# Patient Record
Sex: Male | Born: 1945 | Race: White | Hispanic: No | Marital: Married | Smoking: Former smoker
Health system: Southern US, Community
[De-identification: ages and names within clinical notes are randomized; demographics above are authoritative.]

## PROBLEM LIST (undated history)

## (undated) DIAGNOSIS — J449 Chronic obstructive pulmonary disease, unspecified: Secondary | ICD-10-CM

## (undated) DIAGNOSIS — I340 Nonrheumatic mitral (valve) insufficiency: Secondary | ICD-10-CM

## (undated) DIAGNOSIS — K2981 Duodenitis with bleeding: Principal | ICD-10-CM

## (undated) DIAGNOSIS — I350 Nonrheumatic aortic (valve) stenosis: Secondary | ICD-10-CM

## (undated) DIAGNOSIS — J45909 Unspecified asthma, uncomplicated: Secondary | ICD-10-CM

## (undated) DIAGNOSIS — M109 Gout, unspecified: Secondary | ICD-10-CM

## (undated) DIAGNOSIS — C61 Malignant neoplasm of prostate: Secondary | ICD-10-CM

## (undated) DIAGNOSIS — R911 Solitary pulmonary nodule: Secondary | ICD-10-CM

## (undated) DIAGNOSIS — E041 Nontoxic single thyroid nodule: Secondary | ICD-10-CM

## (undated) DIAGNOSIS — K2970 Gastritis, unspecified, without bleeding: Secondary | ICD-10-CM

## (undated) DIAGNOSIS — I4892 Unspecified atrial flutter: Secondary | ICD-10-CM

## (undated) DIAGNOSIS — D649 Anemia, unspecified: Secondary | ICD-10-CM

## (undated) DIAGNOSIS — B9681 Helicobacter pylori [H. pylori] as the cause of diseases classified elsewhere: Secondary | ICD-10-CM

## (undated) DIAGNOSIS — N183 Chronic kidney disease, stage 3 unspecified: Secondary | ICD-10-CM

## (undated) DIAGNOSIS — I10 Essential (primary) hypertension: Secondary | ICD-10-CM

## (undated) HISTORY — DX: Duodenitis with bleeding: K29.81

## (undated) HISTORY — DX: Helicobacter pylori (H. pylori) as the cause of diseases classified elsewhere: B96.81

## (undated) HISTORY — DX: Nonrheumatic aortic (valve) stenosis: I35.0

## (undated) HISTORY — DX: Chronic kidney disease, stage 3 unspecified: N18.30

## (undated) HISTORY — DX: Essential (primary) hypertension: I10

## (undated) HISTORY — DX: Anemia, unspecified: D64.9

## (undated) HISTORY — PX: COLONOSCOPY: SHX174

## (undated) HISTORY — PX: HERNIA REPAIR: SHX51

## (undated) HISTORY — DX: Malignant neoplasm of prostate: C61

## (undated) HISTORY — PX: PROSTATE SURGERY: SHX751

## (undated) HISTORY — DX: Gastritis, unspecified, without bleeding: K29.70

## (undated) HISTORY — DX: Chronic kidney disease, stage 3 (moderate): N18.3

---

## 2009-11-18 ENCOUNTER — Ambulatory Visit: Payer: Self-pay | Admitting: Surgery

## 2009-11-24 ENCOUNTER — Ambulatory Visit: Payer: Self-pay | Admitting: Surgery

## 2013-04-20 DIAGNOSIS — Z6841 Body Mass Index (BMI) 40.0 and over, adult: Secondary | ICD-10-CM | POA: Insufficient documentation

## 2013-04-20 DIAGNOSIS — I10 Essential (primary) hypertension: Secondary | ICD-10-CM

## 2013-04-20 DIAGNOSIS — R011 Cardiac murmur, unspecified: Secondary | ICD-10-CM

## 2013-04-20 DIAGNOSIS — C61 Malignant neoplasm of prostate: Secondary | ICD-10-CM

## 2013-04-20 DIAGNOSIS — E7800 Pure hypercholesterolemia, unspecified: Secondary | ICD-10-CM | POA: Insufficient documentation

## 2013-04-20 DIAGNOSIS — J449 Chronic obstructive pulmonary disease, unspecified: Secondary | ICD-10-CM | POA: Insufficient documentation

## 2013-04-20 DIAGNOSIS — M549 Dorsalgia, unspecified: Secondary | ICD-10-CM | POA: Insufficient documentation

## 2013-04-23 ENCOUNTER — Encounter: Payer: Self-pay | Admitting: Cardiovascular Disease

## 2013-04-23 ENCOUNTER — Ambulatory Visit (INDEPENDENT_AMBULATORY_CARE_PROVIDER_SITE_OTHER): Payer: 59 | Admitting: Cardiovascular Disease

## 2013-04-23 ENCOUNTER — Ambulatory Visit: Payer: Self-pay | Admitting: Cardiology

## 2013-04-23 ENCOUNTER — Ambulatory Visit (HOSPITAL_COMMUNITY)
Admission: RE | Admit: 2013-04-23 | Discharge: 2013-04-23 | Disposition: A | Discharge status: Home / Self Care | Payer: Medicare PPO | Source: Ambulatory Visit | Attending: Cardiology | Admitting: Cardiology

## 2013-04-23 VITALS — BP 116/65 | HR 100 | Ht 66.0 in | Wt 223.2 lb

## 2013-04-23 DIAGNOSIS — I420 Dilated cardiomyopathy: Secondary | ICD-10-CM

## 2013-04-23 DIAGNOSIS — I429 Cardiomyopathy, unspecified: Secondary | ICD-10-CM | POA: Insufficient documentation

## 2013-04-23 DIAGNOSIS — I359 Nonrheumatic aortic valve disorder, unspecified: Secondary | ICD-10-CM

## 2013-04-23 DIAGNOSIS — E785 Hyperlipidemia, unspecified: Secondary | ICD-10-CM

## 2013-04-23 DIAGNOSIS — Z5181 Encounter for therapeutic drug level monitoring: Secondary | ICD-10-CM

## 2013-04-23 DIAGNOSIS — I350 Nonrheumatic aortic (valve) stenosis: Secondary | ICD-10-CM

## 2013-04-23 DIAGNOSIS — I4892 Unspecified atrial flutter: Secondary | ICD-10-CM | POA: Insufficient documentation

## 2013-04-23 DIAGNOSIS — I10 Essential (primary) hypertension: Secondary | ICD-10-CM | POA: Insufficient documentation

## 2013-04-23 DIAGNOSIS — Z87891 Personal history of nicotine dependence: Secondary | ICD-10-CM | POA: Insufficient documentation

## 2013-04-23 DIAGNOSIS — I428 Other cardiomyopathies: Secondary | ICD-10-CM

## 2013-04-23 DIAGNOSIS — J4489 Other specified chronic obstructive pulmonary disease: Secondary | ICD-10-CM | POA: Insufficient documentation

## 2013-04-23 DIAGNOSIS — I340 Nonrheumatic mitral (valve) insufficiency: Secondary | ICD-10-CM

## 2013-04-23 DIAGNOSIS — I059 Rheumatic mitral valve disease, unspecified: Secondary | ICD-10-CM | POA: Insufficient documentation

## 2013-04-23 DIAGNOSIS — J449 Chronic obstructive pulmonary disease, unspecified: Secondary | ICD-10-CM | POA: Insufficient documentation

## 2013-04-23 DIAGNOSIS — Z7901 Long term (current) use of anticoagulants: Secondary | ICD-10-CM

## 2013-04-23 MED ORDER — CARVEDILOL 3.125 MG PO TABS: 3.1250 mg | ORAL_TABLET | Freq: Two times a day (BID) | ORAL | Status: DC

## 2013-04-23 MED ORDER — FLUTICASONE-SALMETEROL 250-50 MCG/DOSE IN AEPB: 1.0000 | INHALATION_SPRAY | RESPIRATORY_TRACT | Status: DC | PRN

## 2013-04-23 MED ORDER — WARFARIN SODIUM 5 MG PO TABS: 5.0000 mg | ORAL_TABLET | Freq: Every day | ORAL | Status: DC

## 2013-04-23 NOTE — Patient Instructions (Addendum)
Your physician recommends that you schedule a follow-up appointment in: 1 month   Your physician has requested that you have a lexiscan myoview. For further information please visit [email or web address]. Please follow instruction sheet, as given.   Your physician has recommended you make the following change in your medication:   Start Coreg 3.125 mg twice a day  Start Coumadin 5 mg at night--We will make appointment today for you to see Coumadin nurse Edrick Oh RN next week  I have put in a prescription for your Advair  Inhaler also to use as needed   Thanks for choosing [facility] !

## 2013-04-23 NOTE — Progress Notes (Signed)
*  PRELIMINARY RESULTS* Echocardiogram 2D Echocardiogram has been performed.  [place], [place] 04/23/2013, 3:52 PM

## 2013-04-23 NOTE — Progress Notes (Signed)
Patient ID: Ryan Shea, male   DOB: 10-27-1945, 68 y.o.   MRN: [id number]       CARDIOLOGY CONSULT NOTE  Patient ID: Ryan Shea MRN: [id number] DOB/AGE: 09/01/1945 68 y.o.  Admit date: (Not on file) Primary Physician No primary provider on file.  Reason for Consultation: cardiomyopathy, valvular disease, abnormal heart rhythm  HPI: The patient is a 68 yr old male who I am meeting for the first time. He has a history of a cardiomyopathy diagnosed in 08/2012 as well as HTN, COPD, and hyperlipidemia. He tells me he "used to be a heavy drinker" but quit altogether 5-6 years ago. He quit cigarette smoking 30 years ago. He takes Advair prn for shortness of breath. He denies a history of an MI. He denies symptoms of chest pain, dizziness, leg swelling, palpitations, orthopnea, paroxysmal nocturnal dyspnea, and syncope. He had been noncompliant with pravastatin until recently. He reportedly had lipids checked one week ago, but those results are unavailable to me.  He had an echocardiogram performed at [facility] on 08/31/2012 which reportedly showed an "ischemic appearing dilated cardiomyopathy" with wall motion abnormalities. It also showed "mild to moderate LVH, EF 76-19%, grade I diastolic dysfunction, moderate mitral regurgitation, mild aortic regurgitation, at least mild aortic stenosis" but a greater degree was suspected of aortic stenosis due to incorrect measurements of the LVOT. There was also a possible pericardial effusion, which was not deemed hemodynamically significant.  An ECG today shows atrial flutter, 100 bpm, with a nonspecific ST abnormality and one PVC.    No Known Allergies  Current Outpatient Prescriptions  Medication Sig Dispense Refill  . furosemide (LASIX) 40 MG tablet Take 40 mg by mouth daily.      Marland Kitchen lisinopril (PRINIVIL,ZESTRIL) 20 MG tablet Take 20 mg by mouth daily.      . pravastatin (PRAVACHOL) 10 MG tablet Take 10 mg by mouth daily.        . carvedilol (COREG) 3.125 MG tablet Take 1 tablet (3.125 mg total) by mouth 2 (two) times daily with a meal.  60 tablet  6  . warfarin (COUMADIN) 5 MG tablet Take 1 tablet (5 mg total) by mouth daily.  90 tablet  3   No current facility-administered medications for this visit.    No past medical history on file.  No past surgical history on file.  History   Social History  . Marital Status: Married    Spouse Name: N/A    Number of Children: N/A  . Years of Education: N/A   Occupational History  . Not on file.   Social History Main Topics  . Smoking status: Former Research scientist (life sciences)  . Smokeless tobacco: Not on file     Comment: quit 30 years ago  . Alcohol Use: No  . Drug Use: No  . Sexual Activity: Not on file   Other Topics Concern  . Not on file   Social History Narrative  . No narrative on file     No family history on file.   Prior to Admission medications   Medication Sig Start Date End Date Taking? Authorizing Provider  furosemide (LASIX) 40 MG tablet Take 40 mg by mouth daily.   Yes Historical Provider, MD  lisinopril (PRINIVIL,ZESTRIL) 20 MG tablet Take 20 mg by mouth daily.   Yes Historical Provider, MD  pravastatin (PRAVACHOL) 10 MG tablet Take 10 mg by mouth daily.   Yes Historical Provider, MD  carvedilol (COREG) 3.125 MG tablet Take 1 tablet (  3.125 mg total) by mouth 2 (two) times daily with a meal. 04/23/13   Arnoldo Lenis, MD  warfarin (COUMADIN) 5 MG tablet Take 1 tablet (5 mg total) by mouth daily. 04/23/13   Arnoldo Lenis, MD     Review of systems complete and found to be negative unless listed above in HPI     Physical exam Blood pressure 116/65, pulse 100, height 5\' 6"  (1.676 m), weight 223 lb 4 oz (101.266 kg). General: NAD Neck: No JVD, no thyromegaly or thyroid nodule.  Lungs: Clear to auscultation bilaterally with normal respiratory effort. CV: Nondisplaced PMI.  Heart regular rhythm, normal S1, diminished S2, no S3, III/VI harsh  late-peaking systolic murmur at RUSB and III/VI apical holosystolic murmur.  No peripheral edema.  No carotid bruit with transmission of aortic valve murmur.  Normal pedal pulses.  Abdomen: Soft, nontender, no hepatosplenomegaly, no distention.  Skin: Intact without lesions or rashes.  Neurologic: Alert and oriented x 3.  Psych: Normal affect. Extremities: No clubbing or cyanosis.  HEENT: Normal.   Labs:   No results found for this basename: WBC, HGB, HCT, MCV, PLT   No results found for this basename: NA, K, CL, CO2, BUN, CREATININE, CALCIUM, LABALBU, PROT, BILITOT, ALKPHOS, ALT, AST, GLUCOSE,  in the last 168 hours No results found for this basename: CKTOTAL, CKMB, CKMBINDEX, TROPONINI    No results found for this basename: CHOL   No results found for this basename: HDL   No results found for this basename: LDLCALC   No results found for this basename: TRIG   No results found for this basename: CHOLHDL   No results found for this basename: LDLDIRECT         Studies: See above.  ASSESSMENT AND PLAN:  1. Cardiomyopathy: given the prior echo results with mention of wall motion abnormalities, this may be ischemic in etiology, albeit the patient denies a history of MI. Given his current atrial flutter, there is also the possibility that this is tachycardia-mediated. He is already on an ACEI and Lasix. I will start carvedilol 3.125 mg bid with the hopes of improving LV systolic function, as well as to control heart rate. I will obtain a Lexiscan Cardiolite to evaluate for an ischemic etiology. 2. Chronic systolic heart failure: appears compensated at present with no evidence of acute heart failure. Continue Lasix and ACEI. Will add Coreg and likely reassess LV systolic function after 3 months. 3. Atrial flutter: his CHADS-Vasc score is at least 3 (CHF, HTN, age), thus anticoagulation for stroke prevention is indicated. Given his valvular heart disease (moderate MR), warfarin is the  most suitable option. I will initiate this today and enroll him in the anticoagulation clinic for INR monitoring. 4. Aortic stenosis: it was at least mild by his last echo but by auscultation, it sounds more significant. I will obtain an echocardiogram today for a more accurate assessment. No evidence of heart failure at present, and he appears asymptomatic. 5. Mitral regurgitation: last assessed as moderate. This is likely driving his atrial flutter. Will repeat echo. No evidence of heart failure. 6. HTN: presently well controlled on lisinopril 20 mg daily. 7. Hyperlipidemia: reportedly had lipids checked one week ago. Will try and obtain these results.  Dispo: f/u 1 month.  Signed: Kate Sable, M.D., F.A.C.C.  04/23/2013, 1:57 PM

## 2013-04-25 ENCOUNTER — Telehealth: Payer: Self-pay

## 2013-04-25 NOTE — Telephone Encounter (Signed)
Directions for use were provide when rx written but will refax to pharmacy,use 1 inhalation twice a day prn

## 2013-04-25 NOTE — Telephone Encounter (Signed)
Received fax refill for Advair 250/50 mcg diskus and frequency is missing, please clarify. In refill basket.

## 2013-04-25 NOTE — Telephone Encounter (Signed)
Message copied by Bernita Raisin on Wed Apr 25, 2013  5:03 PM ------      Message from: Kate Sable A      Created: Wed Apr 25, 2013  4:13 PM       I will discuss at [facility]. ------

## 2013-04-25 NOTE — Telephone Encounter (Signed)
LM advising pt of MD message,see note below

## 2013-04-27 ENCOUNTER — Telehealth: Payer: Self-pay | Admitting: *Deleted

## 2013-04-27 ENCOUNTER — Telehealth: Payer: Self-pay

## 2013-04-27 DIAGNOSIS — E7800 Pure hypercholesterolemia, unspecified: Secondary | ICD-10-CM

## 2013-04-27 MED ORDER — PRAVASTATIN SODIUM 40 MG PO TABS
40.0000 mg | ORAL_TABLET | Freq: Every evening | ORAL | Status: DC
Start: 2013-04-27 — End: 2013-05-21

## 2013-04-27 NOTE — Telephone Encounter (Signed)
             Ryan Shea (05-25-2045) Call with results, Call patient Received: Today     [name], MD Bernita Raisin, RN            LDL 124. Please have him increase pravastatin to 40 mg daily and repeat lipids in 3 months.                     I spoke with patient,he understands change in medications,will call in new rx and mail lab slip to pt

## 2013-04-27 NOTE — Telephone Encounter (Signed)
Per Dr.Koneswaran, pt needs to use Advair Diskus BID not PRN Faxed to pharmacy

## 2013-04-27 NOTE — Telephone Encounter (Signed)
Fax from [facility] f [phone] advair 250/16mcg  Direction are unclear Form in box

## 2013-04-30 ENCOUNTER — Encounter: Payer: Self-pay | Admitting: Cardiovascular Disease

## 2013-05-01 ENCOUNTER — Telehealth: Payer: Self-pay

## 2013-05-01 NOTE — Telephone Encounter (Signed)
Dose increase confirmed with pharmacy --- spoke with Vaughan Basta, pharmacist with [facility].

## 2013-05-01 NOTE — Telephone Encounter (Signed)
[facility] needs clarification of medication pravastatin (PRAVACHOL) 40 MG tablet  Patient was taking 20mg  from Dr. Karna Dupes.  Need to clarify medication was increased.  Reference # [id number]  Call back [phone]

## 2013-05-04 ENCOUNTER — Encounter (HOSPITAL_COMMUNITY)
Admission: RE | Admit: 2013-05-04 | Discharge: 2013-05-04 | Disposition: A | Discharge status: Home / Self Care | Payer: Medicare PPO | Source: Ambulatory Visit | Attending: Cardiology | Admitting: Cardiology

## 2013-05-04 ENCOUNTER — Encounter (HOSPITAL_COMMUNITY): Payer: Self-pay

## 2013-05-04 DIAGNOSIS — I428 Other cardiomyopathies: Secondary | ICD-10-CM

## 2013-05-04 DIAGNOSIS — I4892 Unspecified atrial flutter: Secondary | ICD-10-CM

## 2013-05-04 DIAGNOSIS — I420 Dilated cardiomyopathy: Secondary | ICD-10-CM

## 2013-05-04 DIAGNOSIS — I429 Cardiomyopathy, unspecified: Secondary | ICD-10-CM

## 2013-05-04 HISTORY — DX: Unspecified asthma, uncomplicated: J45.909

## 2013-05-04 MED ORDER — SODIUM CHLORIDE 0.9 % IJ SOLN
INTRAMUSCULAR | Status: AC
  Administered 2013-05-04: 10 mL via INTRAVENOUS
  Filled 2013-05-04: qty 10

## 2013-05-04 MED ORDER — REGADENOSON 0.4 MG/5ML IV SOLN
INTRAVENOUS | Status: AC
  Administered 2013-05-04: 0.4 mg via INTRAVENOUS
  Filled 2013-05-04: qty 5

## 2013-05-04 MED ORDER — TECHNETIUM TC 99M SESTAMIBI GENERIC - CARDIOLITE
10.0000 | Freq: Once | INTRAVENOUS | Status: AC | PRN
  Administered 2013-05-04: 10 via INTRAVENOUS

## 2013-05-04 MED ORDER — TECHNETIUM TC 99M SESTAMIBI - CARDIOLITE
30.0000 | Freq: Once | INTRAVENOUS | Status: AC | PRN
  Administered 2013-05-04: 30 via INTRAVENOUS

## 2013-05-04 NOTE — Progress Notes (Signed)
Stress Lab Nurses Notes - Ryan Na  [name] 05/04/2013 Reason for doing test: Cardiomyopathy, abnormal heart rhythm Type of test: Wille Glaser Nurse performing test: Gerrit Halls, RN Nuclear Medicine Tech: Redmond Baseman Echo Tech: Not Applicable MD performing test: Koneswaran/K.Lawrence NP Family MD: Alford Highland Test explained and consent signed: yes IV started: 22g jelco, Saline lock flushed, No redness or edema and Saline lock started in radiology Symptoms: SOB Treatment/Intervention: None Reason test stopped: protocol completed After recovery IV was: Discontinued via X-ray tech and No redness or edema Patient to return to [place]. Med at : 11:30 Patient discharged: Home Patient's Condition upon discharge was: stable Comments: During test BP 136/58 & HR 107.  Recovery BP  156/75 & HR 95.  Symptoms resolved in recovery. [name] T

## 2013-05-21 ENCOUNTER — Encounter: Payer: Self-pay | Admitting: Cardiovascular Disease

## 2013-05-21 ENCOUNTER — Ambulatory Visit (INDEPENDENT_AMBULATORY_CARE_PROVIDER_SITE_OTHER): Payer: 59 | Admitting: Cardiovascular Disease

## 2013-05-21 VITALS — BP 147/65 | HR 94 | Ht 66.0 in | Wt 226.0 lb

## 2013-05-21 DIAGNOSIS — I059 Rheumatic mitral valve disease, unspecified: Secondary | ICD-10-CM

## 2013-05-21 DIAGNOSIS — I350 Nonrheumatic aortic (valve) stenosis: Secondary | ICD-10-CM

## 2013-05-21 DIAGNOSIS — I10 Essential (primary) hypertension: Secondary | ICD-10-CM

## 2013-05-21 DIAGNOSIS — G479 Sleep disorder, unspecified: Secondary | ICD-10-CM

## 2013-05-21 DIAGNOSIS — I359 Nonrheumatic aortic valve disorder, unspecified: Secondary | ICD-10-CM

## 2013-05-21 DIAGNOSIS — Z7901 Long term (current) use of anticoagulants: Secondary | ICD-10-CM

## 2013-05-21 DIAGNOSIS — I340 Nonrheumatic mitral (valve) insufficiency: Secondary | ICD-10-CM

## 2013-05-21 DIAGNOSIS — Z5181 Encounter for therapeutic drug level monitoring: Secondary | ICD-10-CM

## 2013-05-21 DIAGNOSIS — I428 Other cardiomyopathies: Secondary | ICD-10-CM

## 2013-05-21 DIAGNOSIS — I4892 Unspecified atrial flutter: Secondary | ICD-10-CM

## 2013-05-21 DIAGNOSIS — I429 Cardiomyopathy, unspecified: Secondary | ICD-10-CM

## 2013-05-21 DIAGNOSIS — E7800 Pure hypercholesterolemia, unspecified: Secondary | ICD-10-CM

## 2013-05-21 MED ORDER — CARVEDILOL 6.25 MG PO TABS: 6.2500 mg | ORAL_TABLET | Freq: Two times a day (BID) | ORAL | Status: DC

## 2013-05-21 MED ORDER — ZOLPIDEM TARTRATE 5 MG PO TABS: 5.0000 mg | ORAL_TABLET | Freq: Every evening | ORAL | Status: DC | PRN

## 2013-05-21 NOTE — Progress Notes (Signed)
Patient ID: Ryan Shea, male   DOB: 26-Mar-1945, 68 y.o.   MRN: [id number]      SUBJECTIVE: The patient is here to followup on the results of cardiovascular testing performed for the evaluation of his cardiomyopathy and valvular heart disease. His Lexiscan Cardiolite was deemed to be probably normal with no definitive ischemic defects seen, and soft tissue attenuation artifact noted. His echocardiogram showed normal left ventricular systolic function, EF 46-27%, severe LVH, grade 1 diastolic dysfunction, high left ventricular filling pressures, mild aortic and mitral regurgitation, and moderate aortic stenosis with a mean gradient of 20 mm mercury. The left atrium was mild to moderately dilated. His most recent lipid panel on February 2 showed total cholesterol 209, HDL 41, LDL 124, triglycerides 221. He had been noncompliant with pravastatin until recently, and now takes it nightly. He denies chest pain, shortness of breath, palpitations and leg swelling. He has had a lot of difficulty sleeping. His blood pressure medication we dropped to the 80 mm mercury systolic range but quickly normalizes. He denies syncope. He denies bleeding problems after starting warfarin.    No Known Allergies  Current Outpatient Prescriptions  Medication Sig Dispense Refill  . carvedilol (COREG) 3.125 MG tablet Take 1 tablet (3.125 mg total) by mouth 2 (two) times daily with a meal.  60 tablet  6  . Fluticasone-Salmeterol (ADVAIR DISKUS) 250-50 MCG/DOSE AEPB Inhale 1 puff into the lungs as needed.  60 each  1  . furosemide (LASIX) 40 MG tablet Take 40 mg by mouth daily.      Marland Kitchen lisinopril (PRINIVIL,ZESTRIL) 20 MG tablet Take 20 mg by mouth daily.      . pravastatin (PRAVACHOL) 40 MG tablet Take 1 tablet (40 mg total) by mouth every evening.  90 tablet  3  . warfarin (COUMADIN) 5 MG tablet Take 1 tablet (5 mg total) by mouth daily.  90 tablet  3   No current facility-administered medications for this visit.     Past Medical History  Diagnosis Date  . Cancer   . Asthma   . Hypertension     No past surgical history on file.  History   Social History  . Marital Status: Married    Spouse Name: N/A    Number of Children: N/A  . Years of Education: N/A   Occupational History  . Not on file.   Social History Main Topics  . Smoking status: Former Research scientist (life sciences)  . Smokeless tobacco: Not on file     Comment: quit 30 years ago  . Alcohol Use: No  . Drug Use: No  . Sexual Activity: Not on file   Other Topics Concern  . Not on file   Social History Narrative  . No narrative on file     Filed Vitals:   05/21/13 1523  Height: 5\' 6"  (1.676 m)  Weight: 226 lb (102.513 kg)   BP 147/65 Pulse 94   PHYSICAL EXAM General: NAD  Neck: No JVD, no thyromegaly or thyroid nodule.  Lungs: Intermittent inspiratory wheezes bilaterally with normal respiratory effort.  CV: Nondisplaced PMI. Heart regular rhythm, normal S1, diminished S2, no S3, III/VI harsh late-peaking systolic murmur at RUSB and III/VI apical holosystolic murmur. No peripheral edema. No carotid bruit with transmission of aortic valve murmur. Normal pedal pulses.  Abdomen: Soft, nontender, no hepatosplenomegaly, no distention.  Skin: Intact without lesions or rashes.  Neurologic: Alert and oriented x 3.  Psych: Normal affect.  Extremities: No clubbing or cyanosis.  HEENT:  Normal.    ECG: reviewed and available in electronic records.   FINDINGS: The patient was stressed according to [facility] protocol. The heart rate ranged between 94 to 108 beats per min. The blood pressure range between 124/55 to 156/75. No chest pain was reported.  The baseline ECG showed normal sinus rhythm. With infusion, there are no diagnostic ST segment or T-wave abnormalities. Isolated PVCs were noted in recovery.  Analysis of the raw data showed no significant extracardiac radiotracer uptake. Analysis of the perfusion images showed a small size,  moderate to severe in intensity, fixed defect in the apical inferior wall, mid inferior wall, and apical inferolateral wall. These regions may have been minimally hypokinetic. Overall, left ventricular systolic function was normal with a calculated LV EF of 57%. Inferior wall soft tissue attenuation was also noted.  IMPRESSION: 1. Probably normal Lexiscan Cardiolite stress test.  2. Small fixed defects seen in the aforementioned regions, with superimposed soft tissue attenuation artifact. While these regions may be indicative of myocardial scar, this cannot be stated with certainty given the overlying soft tissue attenuation artifact. No evidence of ischemia is seen.  3. Normal left ventricular systolic function, LVEF 12%.   - Left ventricle: The cavity size was normal. Wall thickness was increased in a pattern of severe LVH. Systolic function was normal. The estimated ejection fraction was in the range of 50% to 55%. Images were inadequate for LV wall motion assessment. Doppler parameters are consistent with abnormal left ventricular relaxation (grade 1 diastolic dysfunction). Doppler parameters are consistent with high ventricular filling pressure. - Aortic valve: Moderate aortic valve stenosis. Mildly calcified annulus. Moderately thickened, moderately calcified leaflets. Mild regurgitation. Peak velocity: 306cm/s (S). Mean gradient: 91mm Hg (S). Valve area: 1.45cm^2(VTI). Valve area: 1.01cm^2 (Vmax). - Mitral valve: Calcified annulus. Mildly thickened leaflets . Mild regurgitation. - Left atrium: The atrium was mildly to moderately dilated.    ASSESSMENT AND PLAN: 1. Cardiomyopathy: This has resolved with a current EF of 50-55%. Given his atrial flutter, this may have been tachycardia-mediated. He is already on an ACEI and Lasix. I will increase carvedilol to 6.25 mg bid for optimal control of BP and HR. Lexiscan Cardiolite effectively ruled out an ischemic etiology.  2.  Chronic systolic heart failure: appears compensated at present with no evidence of acute heart failure. Continue Lasix and ACEI. Will increase Coreg for more optimal HR and BP control. 3. Atrial flutter: his CHADS-Vasc score is at least 3 (CHF, HTN, age), thus anticoagulation for stroke prevention is indicated. Given his valvular heart disease (moderate MR), warfarin is the most suitable option. I will continue this (initiated at last visit), as he has not experienced any bleeding problems. 4. Aortic stenosis: Moderate by most recent echo. Will continue to monitor. 5. Mitral regurgitation: Mild MR by most recent echo. 6. HTN: presently uncontrolled on lisinopril 20 mg daily and relatively recent institution of Coreg. Will increase Coreg to 6.25 mg bid. I have asked him to inform me should he experience hypotension and dizziness. 7. Hyperlipidemia: His most recent lipid panel on February 2 showed total cholesterol 209, HDL 41, LDL 124, triglycerides 221. He had been noncompliant with pravastatin until recently. Will need to recheck after 3 months of therapy. 8. Sleep disorder: will provide a 30-day supply of Ambien 5 mg qhs prn, with refills to be prescribed by PCP.   Dispo: f/u 3 months.    Kate Sable, M.D., F.A.C.C.

## 2013-05-21 NOTE — Patient Instructions (Signed)
   Increase Coreg to 6.25mg  twice a day - may take 2 tabs of your 3.125mg  tablet twice a day till finish current supply (new script provided today)  Ambien 5mg  at bedtime as needed for sleep - future refills from primary MD - written script provided Continue all other medications.   Follow up in  3 months

## 2013-05-23 ENCOUNTER — Other Ambulatory Visit: Payer: Self-pay | Admitting: Cardiology

## 2013-05-23 MED ORDER — CARVEDILOL 6.25 MG PO TABS: 6.2500 mg | ORAL_TABLET | Freq: Two times a day (BID) | ORAL | Status: DC

## 2013-07-06 DEATH — deceased

## 2013-08-16 ENCOUNTER — Ambulatory Visit (INDEPENDENT_AMBULATORY_CARE_PROVIDER_SITE_OTHER): Payer: Medicare PPO | Admitting: Cardiovascular Disease

## 2013-08-16 ENCOUNTER — Encounter: Payer: Self-pay | Admitting: Cardiovascular Disease

## 2013-08-16 VITALS — BP 143/60 | HR 89 | Ht 66.0 in | Wt 232.0 lb

## 2013-08-16 DIAGNOSIS — I059 Rheumatic mitral valve disease, unspecified: Secondary | ICD-10-CM

## 2013-08-16 DIAGNOSIS — I428 Other cardiomyopathies: Secondary | ICD-10-CM

## 2013-08-16 DIAGNOSIS — E7800 Pure hypercholesterolemia, unspecified: Secondary | ICD-10-CM

## 2013-08-16 DIAGNOSIS — I429 Cardiomyopathy, unspecified: Secondary | ICD-10-CM

## 2013-08-16 DIAGNOSIS — I4892 Unspecified atrial flutter: Secondary | ICD-10-CM

## 2013-08-16 DIAGNOSIS — Z79899 Other long term (current) drug therapy: Secondary | ICD-10-CM

## 2013-08-16 DIAGNOSIS — J449 Chronic obstructive pulmonary disease, unspecified: Secondary | ICD-10-CM

## 2013-08-16 DIAGNOSIS — I340 Nonrheumatic mitral (valve) insufficiency: Secondary | ICD-10-CM

## 2013-08-16 DIAGNOSIS — I359 Nonrheumatic aortic valve disorder, unspecified: Secondary | ICD-10-CM

## 2013-08-16 DIAGNOSIS — I10 Essential (primary) hypertension: Secondary | ICD-10-CM

## 2013-08-16 DIAGNOSIS — I350 Nonrheumatic aortic (valve) stenosis: Secondary | ICD-10-CM

## 2013-08-16 MED ORDER — FUROSEMIDE 20 MG PO TABS: 20.0000 mg | ORAL_TABLET | Freq: Every day | ORAL | Status: DC

## 2013-08-16 NOTE — Patient Instructions (Signed)
   Decrease Lasix to 20mg  daily - new sent to mail order Continue all other medications.   Labs for FLP, LFT - Reminder:  Nothing to eat or drink after 12 midnight prior to labs.  Office will contact with results via phone or letter.   Your physician wants you to follow up in: 6 months.  You will receive a reminder letter in the mail one-two months in advance.  If you don't receive a letter, please call our office to schedule the follow up appointment

## 2013-08-16 NOTE — Progress Notes (Signed)
Patient ID: Ryan Shea, male   DOB: 11/25/1945, 68 y.o.   MRN: [id number]      SUBJECTIVE: Mr. Hevener has been doing much better after the increased dose of Coreg. He denies shortness of breath and leg swelling. He said his systolic blood pressures ranged between 130 to 135 mmHg at home. He wonders if his lisinopril dose is too high. He has been active with his wife gardening.    No Known Allergies  Current Outpatient Prescriptions  Medication Sig Dispense Refill  . carvedilol (COREG) 6.25 MG tablet Take 1 tablet (6.25 mg total) by mouth 2 (two) times daily with a meal.  180 tablet  3  . Fluticasone-Salmeterol (ADVAIR DISKUS) 250-50 MCG/DOSE AEPB Inhale 1 puff into the lungs as needed.  60 each  1  . furosemide (LASIX) 40 MG tablet Take 40 mg by mouth daily.      Marland Kitchen lisinopril (PRINIVIL,ZESTRIL) 20 MG tablet Take 20 mg by mouth daily.      . pravastatin (PRAVACHOL) 40 MG tablet Take 1 tablet by mouth daily.      Marland Kitchen warfarin (COUMADIN) 5 MG tablet Take 1 tablet (5 mg total) by mouth daily.  90 tablet  3  . zolpidem (AMBIEN) 5 MG tablet Take 5 mg by mouth at bedtime as needed for sleep.       No current facility-administered medications for this visit.    Past Medical History  Diagnosis Date  . Cancer   . Asthma   . Hypertension     No past surgical history on file.  History   Social History  . Marital Status: Married    Spouse Name: N/A    Number of Children: N/A  . Years of Education: N/A   Occupational History  . Not on file.   Social History Main Topics  . Smoking status: Former Smoker -- 2.00 packs/day for 20 years    Types: Cigarettes    Start date: 09/01/1959    Quit date: 09/01/1978  . Smokeless tobacco: Never Used     Comment: quit 30 years ago  . Alcohol Use: No  . Drug Use: No  . Sexual Activity: Not on file   Other Topics Concern  . Not on file   Social History Narrative  . No narrative on file     Filed Vitals:   08/16/13 1456  BP: 143/60    Pulse: 89  Height: 5\' 6"  (1.676 m)  Weight: 232 lb (105.235 kg)  SpO2: 96%    PHYSICAL EXAM General: NAD  Neck: No JVD, no thyromegaly or thyroid nodule.  Lungs: Intermittent inspiratory wheezes bilaterally with normal respiratory effort.  CV: Nondisplaced PMI. Heart regular rhythm, normal S1, diminished S2, no S3, III/VI harsh late-peaking systolic murmur at RUSB and III/VI apical holosystolic murmur. No peripheral edema. No carotid bruit with transmission of aortic valve murmur. Normal pedal pulses.  Abdomen: Soft, nontender, no hepatosplenomegaly, no distention.  Skin: Intact without lesions or rashes.  Neurologic: Alert and oriented x 3.  Psych: Normal affect.  Extremities: No clubbing or cyanosis.  HEENT: Normal.   ECG: reviewed and available in electronic records.      ASSESSMENT AND PLAN: 1. Cardiomyopathy: This has resolved with a current EF of 50-55%. Given his atrial flutter, this may have been tachycardia-mediated. He is already on an ACEI, Coreg, and Lasix. Lexiscan Cardiolite effectively ruled out an ischemic etiology. I will reduce Lasix to 20 mg daily. 2. Chronic systolic heart failure: Appears compensated  at present with no evidence of acute heart failure. Continue with reduced dose of Lasix, along with same doses of Coreg and lisinopril. 3. Atrial flutter: Currently in a regular rhythm. Remains on warfarin. 4. Aortic stenosis: Moderate by most recent echo in 04/2013. Will continue to monitor.  5. Mitral regurgitation: Mild MR by most recent echo.  6. HTN: Mildly elevated today. Will monitor. 7. Hyperlipidemia: His most recent lipid panel on February 2 showed total cholesterol 209, HDL 41, LDL 124, triglycerides 221. He had been noncompliant with pravastatin until his last visit. Will check lipids and LFT's.  Dispo: f/u 6 months.  Kate Sable, M.D., F.A.C.C.

## 2014-05-07 DIAGNOSIS — B9681 Helicobacter pylori [H. pylori] as the cause of diseases classified elsewhere: Secondary | ICD-10-CM

## 2014-05-07 DIAGNOSIS — K2970 Gastritis, unspecified, without bleeding: Secondary | ICD-10-CM

## 2014-05-07 DIAGNOSIS — K269 Duodenal ulcer, unspecified as acute or chronic, without hemorrhage or perforation: Secondary | ICD-10-CM

## 2014-05-07 HISTORY — DX: Helicobacter pylori (H. pylori) as the cause of diseases classified elsewhere: B96.81

## 2014-05-07 HISTORY — DX: Duodenal ulcer, unspecified as acute or chronic, without hemorrhage or perforation: K26.9

## 2014-05-07 HISTORY — DX: Gastritis, unspecified, without bleeding: K29.70

## 2014-05-13 ENCOUNTER — Emergency Department (HOSPITAL_COMMUNITY)
Admission: EM | Admit: 2014-05-13 | Discharge: 2014-05-14 | Disposition: A | Discharge status: Home / Self Care | Payer: Medicare PPO | Attending: Emergency Medicine | Admitting: Emergency Medicine

## 2014-05-13 ENCOUNTER — Emergency Department (HOSPITAL_COMMUNITY): Payer: Medicare PPO

## 2014-05-13 ENCOUNTER — Encounter (HOSPITAL_COMMUNITY): Payer: Self-pay | Admitting: *Deleted

## 2014-05-13 DIAGNOSIS — J441 Chronic obstructive pulmonary disease with (acute) exacerbation: Secondary | ICD-10-CM | POA: Diagnosis not present

## 2014-05-13 DIAGNOSIS — D6832 Hemorrhagic disorder due to extrinsic circulating anticoagulants: Secondary | ICD-10-CM

## 2014-05-13 DIAGNOSIS — Z7901 Long term (current) use of anticoagulants: Secondary | ICD-10-CM | POA: Insufficient documentation

## 2014-05-13 DIAGNOSIS — Z8589 Personal history of malignant neoplasm of other organs and systems: Secondary | ICD-10-CM | POA: Insufficient documentation

## 2014-05-13 DIAGNOSIS — I10 Essential (primary) hypertension: Secondary | ICD-10-CM | POA: Insufficient documentation

## 2014-05-13 DIAGNOSIS — Z79899 Other long term (current) drug therapy: Secondary | ICD-10-CM | POA: Diagnosis not present

## 2014-05-13 DIAGNOSIS — Z87891 Personal history of nicotine dependence: Secondary | ICD-10-CM | POA: Diagnosis not present

## 2014-05-13 DIAGNOSIS — J40 Bronchitis, not specified as acute or chronic: Secondary | ICD-10-CM

## 2014-05-13 DIAGNOSIS — R0602 Shortness of breath: Secondary | ICD-10-CM | POA: Diagnosis present

## 2014-05-13 DIAGNOSIS — T45515A Adverse effect of anticoagulants, initial encounter: Secondary | ICD-10-CM

## 2014-05-13 HISTORY — DX: Chronic obstructive pulmonary disease, unspecified: J44.9

## 2014-05-13 LAB — COMPREHENSIVE METABOLIC PANEL
ALK PHOS: 65 U/L (ref 39–117)
ALT: 18 U/L (ref 0–53)
ANION GAP: 5 (ref 5–15)
AST: 16 U/L (ref 0–37)
Albumin: 3.4 g/dL — ABNORMAL LOW (ref 3.5–5.2)
BUN: 28 mg/dL — AB (ref 6–23)
CALCIUM: 8.8 mg/dL (ref 8.4–10.5)
CHLORIDE: 100 mmol/L (ref 96–112)
CO2: 34 mmol/L — ABNORMAL HIGH (ref 19–32)
Creatinine, Ser: 1.25 mg/dL (ref 0.50–1.35)
GFR calc non Af Amer: 57 mL/min — ABNORMAL LOW (ref >90)
GFR, EST AFRICAN AMERICAN: 66 mL/min — AB (ref >90)
Glucose, Bld: 110 mg/dL — ABNORMAL HIGH (ref 70–99)
POTASSIUM: 4.1 mmol/L (ref 3.5–5.1)
SODIUM: 139 mmol/L (ref 135–145)
TOTAL PROTEIN: 7.5 g/dL (ref 6.0–8.3)
Total Bilirubin: 0.6 mg/dL (ref 0.3–1.2)

## 2014-05-13 LAB — CBC WITH DIFFERENTIAL/PLATELET
BASOS ABS: 0 10*3/uL (ref 0.0–0.1)
Basophils Relative: 0 % (ref 0–1)
EOS PCT: 0 % (ref 0–5)
Eosinophils Absolute: 0.1 10*3/uL (ref 0.0–0.7)
HCT: 33.8 % — ABNORMAL LOW (ref 39.0–52.0)
Hemoglobin: 11.1 g/dL — ABNORMAL LOW (ref 13.0–17.0)
Lymphocytes Relative: 21 % (ref 12–46)
Lymphs Abs: 3.3 10*3/uL (ref 0.7–4.0)
MCH: 29.4 pg (ref 26.0–34.0)
MCHC: 32.8 g/dL (ref 30.0–36.0)
MCV: 89.7 fL (ref 78.0–100.0)
Monocytes Absolute: 1.6 10*3/uL — ABNORMAL HIGH (ref 0.1–1.0)
Monocytes Relative: 10 % (ref 3–12)
Neutro Abs: 10.7 10*3/uL — ABNORMAL HIGH (ref 1.7–7.7)
Neutrophils Relative %: 69 % (ref 43–77)
Platelets: 338 10*3/uL (ref 150–400)
RBC: 3.77 MIL/uL — ABNORMAL LOW (ref 4.22–5.81)
RDW: 13.5 % (ref 11.5–15.5)
WBC: 15.7 10*3/uL — ABNORMAL HIGH (ref 4.0–10.5)

## 2014-05-13 LAB — PROTIME-INR
INR: 3.79 — ABNORMAL HIGH (ref 0.00–1.49)
Prothrombin Time: 37.7 seconds — ABNORMAL HIGH (ref 11.6–15.2)

## 2014-05-13 MED ORDER — ALBUTEROL (5 MG/ML) CONTINUOUS INHALATION SOLN
10.0000 mg/h | INHALATION_SOLUTION | Freq: Once | RESPIRATORY_TRACT | Status: AC
  Administered 2014-05-13: 10 mg/h via RESPIRATORY_TRACT
  Filled 2014-05-13: qty 20

## 2014-05-13 MED ORDER — PREDNISONE 50 MG PO TABS
60.0000 mg | ORAL_TABLET | Freq: Once | ORAL | Status: AC
  Administered 2014-05-13: 60 mg via ORAL
  Filled 2014-05-13 (×2): qty 1

## 2014-05-13 MED ORDER — IPRATROPIUM BROMIDE 0.02 % IN SOLN
0.5000 mg | Freq: Once | RESPIRATORY_TRACT | Status: AC
  Administered 2014-05-13: 0.5 mg via RESPIRATORY_TRACT
  Filled 2014-05-13: qty 2.5

## 2014-05-13 NOTE — ED Provider Notes (Signed)
CSN: [id number]     Arrival date & time 05/13/14  1818 History  This chart was scribed for Rolland Porter, MD by Molli Posey, ED Scribe. This patient was seen in room APA07/APA07 and the patient's care was started 11:14 PM.     Chief Complaint  Patient presents with  . Shortness of Breath   The history is provided by the patient. No language interpreter was used.   HPI Comments: Ryan Shea is a 69 y.o. male with a history of CA, asthma, HTN and COPD who presents to the Emergency Department complaining of SOB for the last week. Pt reports associated productive cough with white phlegm, wheezing and subjective fever. Pt states he has a nebulizer and inhalers at home which provided him some relief. He states he is not SOB at his baseline and that walking has worsened his SOB this last week. Pt says he saw his PCP 3 days ago, received a steroid shot and was prescribed a Z-pak. He says that he has not felt better since that time. Pt reports he quit smoking many years ago. Pt reports he is on coumadin for his irregular heart beat. He denies sore throat, rhinorrhea and leg swelling. He reports that time he has had to use his wife's oxygen.  PCP [facility]   Past Medical History  Diagnosis Date  . Cancer   . Asthma   . Hypertension   . COPD (chronic obstructive pulmonary disease)    Past Surgical History  Procedure Laterality Date  . Prostate surgery    . Hernia repair     History reviewed. No pertinent family history. History  Substance Use Topics  . Smoking status: Former Smoker -- 2.00 packs/day for 20 years    Types: Cigarettes    Start date: 09/01/1959    Quit date: 09/01/1978  . Smokeless tobacco: Never Used     Comment: quit 30 years ago  . Alcohol Use: No  lives at home Lives with spouse  Review of Systems  Constitutional: Positive for fever.  HENT: Negative for rhinorrhea and sore throat.   Respiratory: Positive for cough and shortness of breath.    Cardiovascular: Negative for leg swelling.  All other systems reviewed and are negative.   Allergies  Review of patient's allergies indicates no known allergies.  Home Medications   Prior to Admission medications   Medication Sig Start Date End Date Taking? Authorizing Provider  azithromycin (ZITHROMAX) 250 MG tablet Take 250-500 mg by mouth See admin instructions. Starting on 05/11/2014 take two tablets on day 1 then take one tablet on days 2 through 5 05/11/14  Yes Historical Provider, MD  carvedilol (COREG) 6.25 MG tablet Take 1 tablet (6.25 mg total) by mouth 2 (two) times daily with a meal. 05/23/13  Yes [name], MD  Fluticasone-Salmeterol (ADVAIR DISKUS) 250-50 MCG/DOSE AEPB Inhale 1 puff into the lungs as needed. Patient taking differently: Inhale 1 puff into the lungs as needed (for shortness of breath).  04/23/13  Yes [name], MD  furosemide (LASIX) 40 MG tablet Take 40 mg by mouth daily.  02/13/14  Yes Historical Provider, MD  gabapentin (NEURONTIN) 300 MG capsule Take 300 mg by mouth daily as needed (for pain/neuropathy).  02/23/14  Yes Historical Provider, MD  lisinopril (PRINIVIL,ZESTRIL) 20 MG tablet Take 20 mg by mouth daily.   Yes Historical Provider, MD  pravastatin (PRAVACHOL) 40 MG tablet Take 1 tablet by mouth every morning.  07/06/13  Yes Historical  Provider, MD  VENTOLIN HFA 108 (90 BASE) MCG/ACT inhaler Inhale 1-2 puffs into the lungs every 6 (six) hours as needed for wheezing or shortness of breath.  05/10/14  Yes Historical Provider, MD  warfarin (COUMADIN) 4 MG tablet Take 4-5 mg by mouth every morning. Alternate taking 4mg  with 5mg  every day in the morning 05/06/14  Yes Historical Provider, MD  warfarin (COUMADIN) 5 MG tablet Take 1 tablet (5 mg total) by mouth daily. Patient taking differently: Take 4-5 mg by mouth See admin instructions. Alternate taking 4mg  with 5mg  daily in the morning 04/23/13  Yes [name], MD   dextromethorphan-guaiFENesin [facility] DM) 30-600 MG per 12 hr tablet Take 1 tablet by mouth 2 (two) times daily as needed for cough. 05/14/14   Rolland Porter, MD  furosemide (LASIX) 20 MG tablet Take 1 tablet (20 mg total) by mouth daily. Patient not taking: Reported on 05/13/2014 08/16/13   [name], MD   BP 116/48 mmHg  Pulse 108  Temp(Src) 97.5 F (36.4 C) (Oral)  Resp 20  Ht 5\' 6"  (1.676 m)  Wt 239 lb (108.41 kg)  BMI 38.59 kg/m2  SpO2 93%  Vital signs normal except tachycardia  Physical Exam  Constitutional: He is oriented to person, place, and time. He appears well-developed and well-nourished.  Non-toxic appearance. He does not appear ill. No distress.  HENT:  Head: Normocephalic and atraumatic.  Right Ear: External ear normal.  Left Ear: External ear normal.  Nose: Nose normal. No mucosal edema or rhinorrhea.  Mouth/Throat: Oropharynx is clear and moist and mucous membranes are normal. No dental abscesses or uvula swelling.  Eyes: Conjunctivae and EOM are normal. Pupils are equal, round, and reactive to light.  Neck: Normal range of motion and full passive range of motion without pain. Neck supple.  Cardiovascular: Normal rate, regular rhythm and normal heart sounds.  Exam reveals no gallop and no friction rub.   No murmur heard. Pulmonary/Chest: Effort normal. No respiratory distress. He has decreased breath sounds. He has wheezes. He has no rhonchi. He has rales. He exhibits no tenderness and no crepitus.  Diffuse wheezing and some scattered rales in the bases.   Abdominal: Soft. Normal appearance and bowel sounds are normal. He exhibits no distension. There is no tenderness. There is no rebound and no guarding.  Musculoskeletal: Normal range of motion. He exhibits no edema or tenderness.  Moves all extremities well.   Neurological: He is alert and oriented to person, place, and time. He has normal strength. No cranial nerve deficit.  Skin: Skin is warm, dry and  intact. No rash noted. No erythema. No pallor.  Psychiatric: He has a normal mood and affect. His speech is normal and behavior is normal. His mood appears not anxious.  Nursing note and vitals reviewed.   ED Course  Procedures    Medications  levofloxacin (LEVAQUIN) tablet 750 mg (not administered)  albuterol (PROVENTIL,VENTOLIN) solution continuous neb (10 mg/hr Nebulization Given 05/13/14 2337)  ipratropium (ATROVENT) nebulizer solution 0.5 mg (0.5 mg Nebulization Given 05/13/14 2337)  predniSONE (DELTASONE) tablet 60 mg (60 mg Oral Given 05/13/14 2335)    DIAGNOSTIC STUDIES: Oxygen Saturation is 98% on RA, normal by my interpretation.    COORDINATION OF CARE: 11:22 PM Discussed treatment plan with pt at bedside and pt agreed to plan.  12:13 AM Rechecked. Pt says he is feeling better and his lungs are getting clear. He reports he has improved air movement. Breathing treatment just started.  I checked the patient again about 1:30 AM. He states he is too shaky from his nebulizer. It was stopped to give him a brief rest.  Recheck at 2 AM patient states he feels much improved. His lung exam shows much improved air movement with rare rhonchi. Patient was angulated by nursing staff and his pulse ox remained 93% on room air. At this point patient was felt to be stable for discharge.  Labs Review Results for orders placed or performed during the hospital encounter of 05/13/14  CBC with Differential  Result Value Ref Range   WBC 15.7 (H) 4.0 - 10.5 K/uL   RBC 3.77 (L) 4.22 - 5.81 MIL/uL   Hemoglobin 11.1 (L) 13.0 - 17.0 g/dL   HCT 33.8 (L) 39.0 - 52.0 %   MCV 89.7 78.0 - 100.0 fL   MCH 29.4 26.0 - 34.0 pg   MCHC 32.8 30.0 - 36.0 g/dL   RDW 13.5 11.5 - 15.5 %   Platelets 338 150 - 400 K/uL   Neutrophils Relative % 69 43 - 77 %   Neutro Abs 10.7 (H) 1.7 - 7.7 K/uL   Lymphocytes Relative 21 12 - 46 %   Lymphs Abs 3.3 0.7 - 4.0 K/uL   Monocytes Relative 10 3 - 12 %   Monocytes Absolute  1.6 (H) 0.1 - 1.0 K/uL   Eosinophils Relative 0 0 - 5 %   Eosinophils Absolute 0.1 0.0 - 0.7 K/uL   Basophils Relative 0 0 - 1 %   Basophils Absolute 0.0 0.0 - 0.1 K/uL  Comprehensive metabolic panel  Result Value Ref Range   Sodium 139 135 - 145 mmol/L   Potassium 4.1 3.5 - 5.1 mmol/L   Chloride 100 96 - 112 mmol/L   CO2 34 (H) 19 - 32 mmol/L   Glucose, Bld 110 (H) 70 - 99 mg/dL   BUN 28 (H) 6 - 23 mg/dL   Creatinine, Ser 1.25 0.50 - 1.35 mg/dL   Calcium 8.8 8.4 - 10.5 mg/dL   Total Protein 7.5 6.0 - 8.3 g/dL   Albumin 3.4 (L) 3.5 - 5.2 g/dL   AST 16 0 - 37 U/L   ALT 18 0 - 53 U/L   Alkaline Phosphatase 65 39 - 117 U/L   Total Bilirubin 0.6 0.3 - 1.2 mg/dL   GFR calc non Af Amer 57 (L) >90 mL/min   GFR calc Af Amer 66 (L) >90 mL/min   Anion gap 5 5 - 15  Protime-INR  Result Value Ref Range   Prothrombin Time 37.7 (H) 11.6 - 15.2 seconds   INR 3.79 (H) 0.00 - 1.49   Laboratory interpretation all normal except INR is over therapeutic, leukocytosis present     Imaging Review Dg Chest 2 View  05/13/2014   CLINICAL DATA:  Shortness of breath and productive cough for 1 week.  EXAM: CHEST  2 VIEW  COMPARISON:  None.  FINDINGS: The lungs are clear. Heart size is normal. There is no pneumothorax or pleural effusion. Mild thoracic degenerative change is noted.  IMPRESSION: No acute disease.   Electronically Signed   By: Inge Rise M.D.   On: 05/13/2014 18:53     EKG Interpretation None      MDM   Final diagnoses:  COPD with acute exacerbation  Bronchitis  Warfarin-induced coagulopathy    New Prescriptions   DEXTROMETHORPHAN-GUAIFENESIN (MUCINEX DM) 30-600 MG PER 12 HR TABLET    Take 1 tablet by mouth 2 (two) times daily  as needed for cough.   LEVOFLOXACIN (LEVAQUIN) 750 MG TABLET    Take 1 tablet (750 mg total) by mouth daily.   PREDNISONE (DELTASONE) 20 MG TABLET    Take 3 po QD x 3d , then 2 po QD x 3d then 1 po QD x 3d    Plan discharge  Rolland Porter, MD,  FACEP   I personally performed the services described in this documentation, which was scribed in my presence. The recorded information has been reviewed and considered.  Rolland Porter, MD, Barbette Or, MD 05/14/14 [phone]

## 2014-05-13 NOTE — ED Notes (Addendum)
Sob for 1 week,cough,  Fever last week,  Denies pain  Seen by MD on Friday and started antibiotic. , says he is no better.

## 2014-05-14 MED ORDER — PREDNISONE 20 MG PO TABS: ORAL_TABLET | ORAL | Status: DC

## 2014-05-14 MED ORDER — LEVOFLOXACIN 750 MG PO TABS
750.0000 mg | ORAL_TABLET | Freq: Every day | ORAL | Status: DC
Start: 2014-05-14 — End: 2014-06-01

## 2014-05-14 MED ORDER — DM-GUAIFENESIN ER 30-600 MG PO TB12: 1.0000 | ORAL_TABLET | Freq: Two times a day (BID) | ORAL | Status: DC | PRN

## 2014-05-14 MED ORDER — LEVOFLOXACIN 750 MG PO TABS
750.0000 mg | ORAL_TABLET | Freq: Once | ORAL | Status: AC
  Administered 2014-05-14: 750 mg via ORAL
  Filled 2014-05-14: qty 1

## 2014-05-14 NOTE — Discharge Instructions (Signed)
Stop the zpak and start taking the levaquin. Your INR tonight was 3.79, call your coumadin clinic in the morning to get further instructions about your coumadin dose. Unfortunately the antibiotics you are on change how coumadin in processed in your body and you will need to stop it for a few days and take a lower dose until you finish the antibiotics. Use your inhaler and nebulizer more frequently for your wheezing and shortness of breath.  Recheck if you feel worse again.

## 2014-05-30 ENCOUNTER — Inpatient Hospital Stay (HOSPITAL_COMMUNITY)
Admission: EM | Admit: 2014-05-30 | Discharge: 2014-06-01 | DRG: 378 | Disposition: A | Discharge status: Home / Self Care | Payer: Medicare PPO | Attending: Internal Medicine | Admitting: Internal Medicine

## 2014-05-30 ENCOUNTER — Emergency Department (HOSPITAL_COMMUNITY): Payer: Medicare PPO

## 2014-05-30 ENCOUNTER — Encounter (HOSPITAL_COMMUNITY): Payer: Self-pay | Admitting: *Deleted

## 2014-05-30 DIAGNOSIS — I10 Essential (primary) hypertension: Secondary | ICD-10-CM | POA: Diagnosis not present

## 2014-05-30 DIAGNOSIS — E7800 Pure hypercholesterolemia, unspecified: Secondary | ICD-10-CM | POA: Diagnosis present

## 2014-05-30 DIAGNOSIS — E785 Hyperlipidemia, unspecified: Secondary | ICD-10-CM | POA: Diagnosis present

## 2014-05-30 DIAGNOSIS — K921 Melena: Secondary | ICD-10-CM | POA: Diagnosis present

## 2014-05-30 DIAGNOSIS — I4891 Unspecified atrial fibrillation: Secondary | ICD-10-CM | POA: Diagnosis present

## 2014-05-30 DIAGNOSIS — I4892 Unspecified atrial flutter: Secondary | ICD-10-CM | POA: Diagnosis present

## 2014-05-30 DIAGNOSIS — D62 Acute posthemorrhagic anemia: Secondary | ICD-10-CM | POA: Diagnosis present

## 2014-05-30 DIAGNOSIS — K2970 Gastritis, unspecified, without bleeding: Secondary | ICD-10-CM | POA: Diagnosis not present

## 2014-05-30 DIAGNOSIS — Z23 Encounter for immunization: Secondary | ICD-10-CM

## 2014-05-30 DIAGNOSIS — Z8249 Family history of ischemic heart disease and other diseases of the circulatory system: Secondary | ICD-10-CM | POA: Diagnosis not present

## 2014-05-30 DIAGNOSIS — K2901 Acute gastritis with bleeding: Secondary | ICD-10-CM | POA: Diagnosis not present

## 2014-05-30 DIAGNOSIS — T45515A Adverse effect of anticoagulants, initial encounter: Secondary | ICD-10-CM

## 2014-05-30 DIAGNOSIS — I483 Typical atrial flutter: Secondary | ICD-10-CM

## 2014-05-30 DIAGNOSIS — K922 Gastrointestinal hemorrhage, unspecified: Secondary | ICD-10-CM | POA: Diagnosis not present

## 2014-05-30 DIAGNOSIS — J449 Chronic obstructive pulmonary disease, unspecified: Secondary | ICD-10-CM | POA: Diagnosis not present

## 2014-05-30 DIAGNOSIS — Z87891 Personal history of nicotine dependence: Secondary | ICD-10-CM | POA: Diagnosis not present

## 2014-05-30 DIAGNOSIS — K2981 Duodenitis with bleeding: Secondary | ICD-10-CM | POA: Diagnosis present

## 2014-05-30 DIAGNOSIS — R791 Abnormal coagulation profile: Secondary | ICD-10-CM | POA: Diagnosis not present

## 2014-05-30 DIAGNOSIS — Z7901 Long term (current) use of anticoagulants: Secondary | ICD-10-CM

## 2014-05-30 DIAGNOSIS — K263 Acute duodenal ulcer without hemorrhage or perforation: Secondary | ICD-10-CM | POA: Diagnosis not present

## 2014-05-30 DIAGNOSIS — I429 Cardiomyopathy, unspecified: Secondary | ICD-10-CM | POA: Diagnosis not present

## 2014-05-30 DIAGNOSIS — K269 Duodenal ulcer, unspecified as acute or chronic, without hemorrhage or perforation: Secondary | ICD-10-CM | POA: Diagnosis present

## 2014-05-30 DIAGNOSIS — J45909 Unspecified asthma, uncomplicated: Secondary | ICD-10-CM | POA: Diagnosis present

## 2014-05-30 DIAGNOSIS — D689 Coagulation defect, unspecified: Secondary | ICD-10-CM | POA: Diagnosis not present

## 2014-05-30 DIAGNOSIS — K264 Chronic or unspecified duodenal ulcer with hemorrhage: Secondary | ICD-10-CM | POA: Diagnosis present

## 2014-05-30 LAB — OCCULT BLOOD, POC DEVICE: FECAL OCCULT BLD: POSITIVE — AB

## 2014-05-30 LAB — PROTIME-INR
INR: 6.61 — AB (ref 0.00–1.49)
PROTHROMBIN TIME: 58.2 s — AB (ref 11.6–15.2)

## 2014-05-30 LAB — COMPREHENSIVE METABOLIC PANEL
ALK PHOS: 35 U/L — AB (ref 39–117)
ALT: 15 U/L (ref 0–53)
AST: 14 U/L (ref 0–37)
Albumin: 2.3 g/dL — ABNORMAL LOW (ref 3.5–5.2)
Anion gap: 6 (ref 5–15)
BILIRUBIN TOTAL: 0.4 mg/dL (ref 0.3–1.2)
BUN: 93 mg/dL — AB (ref 6–23)
CHLORIDE: 108 mmol/L (ref 96–112)
CO2: 23 mmol/L (ref 19–32)
Calcium: 7.9 mg/dL — ABNORMAL LOW (ref 8.4–10.5)
Creatinine, Ser: 1.8 mg/dL — ABNORMAL HIGH (ref 0.50–1.35)
GFR calc non Af Amer: 37 mL/min — ABNORMAL LOW (ref >90)
GFR, EST AFRICAN AMERICAN: 43 mL/min — AB (ref >90)
Glucose, Bld: 119 mg/dL — ABNORMAL HIGH (ref 70–99)
Potassium: 4.4 mmol/L (ref 3.5–5.1)
Sodium: 137 mmol/L (ref 135–145)
Total Protein: 5 g/dL — ABNORMAL LOW (ref 6.0–8.3)

## 2014-05-30 LAB — CBC
HCT: 13.1 % — ABNORMAL LOW (ref 39.0–52.0)
Hemoglobin: 4.3 g/dL — CL (ref 13.0–17.0)
MCH: 29.7 pg (ref 26.0–34.0)
MCHC: 32.8 g/dL (ref 30.0–36.0)
MCV: 90.3 fL (ref 78.0–100.0)
PLATELETS: 246 10*3/uL (ref 150–400)
RBC: 1.45 MIL/uL — ABNORMAL LOW (ref 4.22–5.81)
RDW: 14.8 % (ref 11.5–15.5)
WBC: 15.8 10*3/uL — ABNORMAL HIGH (ref 4.0–10.5)

## 2014-05-30 LAB — I-STAT CG4 LACTIC ACID, ED: Lactic Acid, Venous: 0.92 mmol/L (ref 0.5–2.0)

## 2014-05-30 LAB — MRSA PCR SCREENING: MRSA by PCR: NEGATIVE

## 2014-05-30 LAB — PREPARE RBC (CROSSMATCH)

## 2014-05-30 LAB — ABO/RH: ABO/RH(D): O POS

## 2014-05-30 MED ORDER — ONDANSETRON HCL 4 MG PO TABS: 4.0000 mg | ORAL_TABLET | Freq: Four times a day (QID) | ORAL | Status: DC | PRN

## 2014-05-30 MED ORDER — PANTOPRAZOLE SODIUM 40 MG IV SOLR
40.0000 mg | Freq: Two times a day (BID) | INTRAVENOUS | Status: DC
  Administered 2014-05-30 (×2): 40 mg via INTRAVENOUS
  Filled 2014-05-30 (×2): qty 40

## 2014-05-30 MED ORDER — SODIUM CHLORIDE 0.9 % IV SOLN
Freq: Once | INTRAVENOUS | Status: AC
  Administered 2014-05-30: 13:00:00 via INTRAVENOUS

## 2014-05-30 MED ORDER — SENNOSIDES-DOCUSATE SODIUM 8.6-50 MG PO TABS: 1.0000 | ORAL_TABLET | Freq: Every evening | ORAL | Status: DC | PRN

## 2014-05-30 MED ORDER — SODIUM CHLORIDE 0.9 % IV SOLN
1000.0000 mL | Freq: Once | INTRAVENOUS | Status: AC
  Administered 2014-05-30: 1000 mL via INTRAVENOUS

## 2014-05-30 MED ORDER — CETYLPYRIDINIUM CHLORIDE 0.05 % MT LIQD
7.0000 mL | Freq: Two times a day (BID) | OROMUCOSAL | Status: DC
  Administered 2014-05-30 – 2014-06-01 (×4): 7 mL via OROMUCOSAL

## 2014-05-30 MED ORDER — SODIUM CHLORIDE 0.9 % IV SOLN: 10.0000 mL/h | Freq: Once | INTRAVENOUS | Status: DC

## 2014-05-30 MED ORDER — ACETAMINOPHEN 325 MG PO TABS
650.0000 mg | ORAL_TABLET | Freq: Four times a day (QID) | ORAL | Status: DC | PRN
  Administered 2014-05-31 – 2014-06-01 (×2): 650 mg via ORAL
  Filled 2014-05-30 (×2): qty 2

## 2014-05-30 MED ORDER — VITAMIN K1 10 MG/ML IJ SOLN
10.0000 mg | Freq: Once | INTRAVENOUS | Status: AC
  Administered 2014-05-30: 10 mg via INTRAVENOUS
  Filled 2014-05-30: qty 1

## 2014-05-30 MED ORDER — SODIUM CHLORIDE 0.9 % IV SOLN
80.0000 mg | Freq: Once | INTRAVENOUS | Status: AC
  Administered 2014-05-30: 80 mg via INTRAVENOUS
  Filled 2014-05-30: qty 80

## 2014-05-30 MED ORDER — ACETAMINOPHEN 650 MG RE SUPP: 650.0000 mg | Freq: Four times a day (QID) | RECTAL | Status: DC | PRN

## 2014-05-30 MED ORDER — SODIUM CHLORIDE 0.9 % IV SOLN: 1000.0000 mL | INTRAVENOUS | Status: DC

## 2014-05-30 MED ORDER — SODIUM CHLORIDE 0.9 % IV SOLN
INTRAVENOUS | Status: DC
  Administered 2014-05-30 (×2): via INTRAVENOUS

## 2014-05-30 MED ORDER — ONDANSETRON HCL 4 MG/2ML IJ SOLN: 4.0000 mg | Freq: Four times a day (QID) | INTRAMUSCULAR | Status: DC | PRN

## 2014-05-30 MED ORDER — PNEUMOCOCCAL VAC POLYVALENT 25 MCG/0.5ML IJ INJ
0.5000 mL | INJECTION | INTRAMUSCULAR | Status: AC
  Administered 2014-05-31: 0.5 mL via INTRAMUSCULAR
  Filled 2014-05-30: qty 0.5

## 2014-05-30 NOTE — ED Notes (Signed)
Blood consent signed

## 2014-05-30 NOTE — ED Provider Notes (Signed)
CSN: [id number]     Arrival date & time 05/30/14  0813 History   First MD Initiated Contact with Patient 05/30/14 0818     Chief Complaint  Patient presents with  . Shortness of Breath     (Consider location/radiation/quality/duration/timing/severity/associated sxs/prior Treatment) HPI Comments: Patient is a 69 year old male who presents to the emergency department with complaint of shortness of breath. Patient was brought to the emergency department by EMS this morning. The patient states that he has had about 2 weeks of dark stools, and about a week of shortness of breath. It is of note that he is on Coumadin due to a cardiac condition. He has a history of cancer, chronic obstructive pulmonary disease, asthma, and hypertension. He states that his shortness of breath is worse than usual particular if he attempts to walk any distance. He has not had any chest pain. He has not had vomiting. He's not been coughing up any blood. He was seen in the emergency department on March 7 at which time he was having an exacerbation of his chronic obstructive pulmonary disease/bronchitis. He was recently placed on iron pills by his primary care physician, the patient states that he still feels weak and short of breath.  Patient is a 69 y.o. male presenting with shortness of breath. The history is provided by the patient.  Shortness of Breath Severity:  Moderate Associated symptoms: cough and wheezing   Associated symptoms: no abdominal pain, no chest pain, no neck pain and no vomiting     Past Medical History  Diagnosis Date  . Asthma   . Hypertension   . COPD (chronic obstructive pulmonary disease)   . Cancer     Prostate    Past Surgical History  Procedure Laterality Date  . Prostate surgery    . Hernia repair     No family history on file. History  Substance Use Topics  . Smoking status: Former Smoker -- 2.00 packs/day for 20 years    Types: Cigarettes    Start date: 09/01/1959    Quit  date: 09/01/1978  . Smokeless tobacco: Never Used     Comment: quit 30 years ago  . Alcohol Use: No    Review of Systems  Constitutional: Negative for activity change.       All ROS Neg except as noted in HPI  HENT: Negative for nosebleeds.   Eyes: Negative for photophobia and discharge.  Respiratory: Positive for cough, shortness of breath and wheezing.   Cardiovascular: Negative for chest pain and palpitations.  Gastrointestinal: Positive for nausea and diarrhea. Negative for vomiting, abdominal pain, constipation and blood in stool.  Genitourinary: Negative for dysuria, frequency and hematuria.  Musculoskeletal: Negative for back pain, arthralgias and neck pain.  Skin: Negative.   Neurological: Negative for dizziness, seizures and speech difficulty.  Psychiatric/Behavioral: Negative for hallucinations and confusion.      Allergies  Review of patient's allergies indicates no known allergies.  Home Medications   Prior to Admission medications   Medication Sig Start Date End Date Taking? Authorizing Provider  azithromycin (ZITHROMAX) 250 MG tablet Take 250-500 mg by mouth See admin instructions. Starting on 05/11/2014 take two tablets on day 1 then take one tablet on days 2 through 5 05/11/14   Historical Provider, MD  carvedilol (COREG) 6.25 MG tablet Take 1 tablet (6.25 mg total) by mouth 2 (two) times daily with a meal. 05/23/13   [name], MD  dextromethorphan-guaiFENesin [facility] DM) 30-600 MG per 12 hr tablet  Take 1 tablet by mouth 2 (two) times daily as needed for cough. 05/14/14   Rolland Porter, MD  Fluticasone-Salmeterol (ADVAIR DISKUS) 250-50 MCG/DOSE AEPB Inhale 1 puff into the lungs as needed. Patient taking differently: Inhale 1 puff into the lungs as needed (for shortness of breath).  04/23/13   [name], MD  furosemide (LASIX) 20 MG tablet Take 1 tablet (20 mg total) by mouth daily. Patient not taking: Reported on 05/13/2014 08/16/13   [name], MD   furosemide (LASIX) 40 MG tablet Take 40 mg by mouth daily.  02/13/14   Historical Provider, MD  gabapentin (NEURONTIN) 300 MG capsule Take 300 mg by mouth daily as needed (for pain/neuropathy).  02/23/14   Historical Provider, MD  levofloxacin (LEVAQUIN) 750 MG tablet Take 1 tablet (750 mg total) by mouth daily. 05/14/14   Rolland Porter, MD  lisinopril (PRINIVIL,ZESTRIL) 20 MG tablet Take 20 mg by mouth daily.    Historical Provider, MD  pravastatin (PRAVACHOL) 40 MG tablet Take 1 tablet by mouth every morning.  07/06/13   Historical Provider, MD  predniSONE (DELTASONE) 20 MG tablet Take 3 po QD x 3d , then 2 po QD x 3d then 1 po QD x 3d 05/14/14   Rolland Porter, MD  VENTOLIN HFA 108 (90 BASE) MCG/ACT inhaler Inhale 1-2 puffs into the lungs every 6 (six) hours as needed for wheezing or shortness of breath.  05/10/14   Historical Provider, MD  warfarin (COUMADIN) 4 MG tablet Take 4-5 mg by mouth every morning. Alternate taking 4mg  with 5mg  every day in the morning 05/06/14   Historical Provider, MD  warfarin (COUMADIN) 5 MG tablet Take 1 tablet (5 mg total) by mouth daily. Patient taking differently: Take 4-5 mg by mouth See admin instructions. Alternate taking 4mg  with 5mg  daily in the morning 04/23/13   [name], MD   BP 97/49 mmHg  Pulse 118  Temp(Src) 98.1 F (36.7 C) (Oral)  Resp 20  Ht 5\' 6"  (1.676 m)  Wt 230 lb (104.327 kg)  BMI 37.14 kg/m2  SpO2 99% Physical Exam  Constitutional: He is oriented to person, place, and time. He appears well-developed and well-nourished.  Non-toxic appearance. No distress.  HENT:  Head: Normocephalic.  Right Ear: Tympanic membrane and external ear normal.  Left Ear: Tympanic membrane and external ear normal.  The tongue and soft palate are pale.  Eyes: EOM and lids are normal. Pupils are equal, round, and reactive to light.  Neck: Normal range of motion. Neck supple. Carotid bruit is not present.  Cardiovascular: Regular rhythm, normal heart sounds, intact  distal pulses and normal pulses.  Tachycardia present.  Exam reveals no gallop and no friction rub.   Pulmonary/Chest: Breath sounds normal. No respiratory distress.  Abdominal: Soft. Bowel sounds are normal. There is no tenderness. There is no guarding.  Genitourinary: Guaiac positive stool.  Musculoskeletal: Normal range of motion. He exhibits no edema or tenderness.  Lymphadenopathy:       Head (right side): No submandibular adenopathy present.       Head (left side): No submandibular adenopathy present.    He has no cervical adenopathy.  Neurological: He is alert and oriented to person, place, and time. He has normal strength. No cranial nerve deficit or sensory deficit.  Skin: Skin is warm and dry. He is not diaphoretic. There is pallor.  Psychiatric: He has a normal mood and affect. His speech is normal.  Nursing note and vitals reviewed.  ED Course Pt seen with me by Dr Jeneen Rinks.  Procedures (including critical care time) CRITICAL CARE Performed by: Lenox Ahr Total critical care time: **50 min* Critical care time was exclusive of separately billable procedures and treating other patients. Critical care was necessary to treat or prevent imminent or life-threatening deterioration. Critical care was time spent personally by me on the following activities: development of treatment plan with patient and/or surrogate as well as nursing, discussions with consultants, evaluation of patient's response to treatment, examination of patient, obtaining history from patient or surrogate, ordering and performing treatments and interventions, ordering and review of laboratory studies, ordering and review of radiographic studies, pulse oximetry and re-evaluation of patient's condition. Labs Review Labs Reviewed  PROTIME-INR  CBC  COMPREHENSIVE METABOLIC PANEL  LACTIC ACID, PLASMA  POC OCCULT BLOOD, ED  POC OCCULT BLOOD, ED  TYPE AND SCREEN    Imaging Review No results found.   EKG  Interpretation None      MDM Pt tachycardic with blood pressure 89 to 97 systolic. Stool black and pos for occult blood.   INR 6.61, PT 58.2 critical elevation Hgb 4.3, HCT 13.1 critical low. FFP ordered. IV protonix ordered. Pt made aware of findings and need for admission..  Chest xray is neg for acute disease.  10:19 - Tolerating IV bolus and Protonix without problem. BUN 93, Creat 1.8 elevated. Albumin 2.3 low. Liver function non-acute.  10:19 - FFP now ready. Monitoring I and O. Pt now reports he has been on antibiotics for about 3 weeks. This may be the catalyst for elevated PT-INR.  11:04 - Case discussed with Dr Jerilee Hoh (hospitalist) and Dr Oneida Alar (GI). Dr Jerilee Hoh will admit to stepdown.   Final diagnoses:  None    **I have reviewed nursing notes, vital signs, and all appropriate lab and imaging results for this patient.Lily Kocher, PA-C 05/30/14 [place], MD 06/14/14 [phone]

## 2014-05-30 NOTE — ED Notes (Signed)
FFP was administed at 583ml/h due to hx of CHF and already received 754ml NS, per provider.

## 2014-05-30 NOTE — ED Notes (Signed)
Pt called EMS this morning for shortness of breath. Pt stated that he fell 3 weeks ago, two weeks ago, he began having dark stools 2 weeks ago. He became SOB with generalized weakness 1 week ago. PCP recently put pt on iron pills for anemia per EMS. Pt is not a known diabetic but CBG en route was 334 with BP 88/40 by EMS. Hx of CHF as well.

## 2014-05-30 NOTE — Care Management Utilization Note (Signed)
UR completed 

## 2014-05-30 NOTE — ED Provider Notes (Signed)
Pt seen and evaluated.  History reviewed. Pt with h/o PAF, on Coumadin.  In last 3 weeks has been on Zmax, then Levaquin, then Zmax again.  Reports "pitch black" stols for 2 weeks, and progressive DOE, and now frank weakness.    Coagulopathic with INR of 6, Hb of 4.3  Pt given IV protonix, and now starting FFP and PRBCs.  Will require admit.  Hospitalist and GI consultation pending.   Tanna Furry, MD 05/30/14 1034

## 2014-05-30 NOTE — Consult Note (Signed)
Referring Provider: No ref. provider found Primary Care Physician:  Mackey Birchwood Primary Gastroenterologist:  Dr.  Date of Admission: 05/30/14 Date of Consultation: 05/30/14  Reason for Consultation:  Melena, anemia  HPI:  69 year old male presents with a 2 week history of dark stools, on coumadin for a atrial flutter managed by Dr. Bronson Ing.  Notified his PCP of his dark stools. Last episode of dark stools yesterday, with none since at least last night after 8:00 pm. Last outpatient INR 3.79 on 05/13/14. About 1 week ago noted worsening weakness, dizziness when standing with a syncopal episode about 5-7 days ago as well as worsening shortness of breath. Denies bright red blood per rectum or hematemesis. Also noted worsening bruising over the last 2 weeks per family. This morning patient was very weak and short of breath so his wife called EMS to bring him to the ER. On presentation his stool was guaic+, H/H noted 4.3/13.1 and INR 6.6. FFP x 2 and PRBC x 2 ordered. At time of visit patient was receiving his 2nd unit of FFP with no PRBCs transfused as of yet. Last colonoscopy in [place] at [facility] with no significant findings and recommended repeat in 10 years (per patient). Has never had an EGD. Denies GERD symptoms, N/V, dyspepsia, abdominal pain. Denies any other upper or lower GI symptoms  Past Medical History  Diagnosis Date  . Asthma   . Hypertension   . COPD (chronic obstructive pulmonary disease)   . Cancer     Prostate     Past Surgical History  Procedure Laterality Date  . Prostate surgery    . Hernia repair      Prior to Admission medications   Medication Sig Start Date End Date Taking? Authorizing Provider  carvedilol (COREG) 6.25 MG tablet Take 1 tablet (6.25 mg total) by mouth 2 (two) times daily with a meal. 05/23/13  Yes [name], MD  dextromethorphan-guaiFENesin (MUCINEX DM) 30-600 MG per 12 hr tablet Take 1 tablet by mouth 2 (two)  times daily as needed for cough. 05/14/14  Yes Rolland Porter, MD  ferrous sulfate 325 (65 FE) MG tablet Take 325 mg by mouth daily with breakfast.   Yes Historical Provider, MD  Fluticasone-Salmeterol (ADVAIR DISKUS) 250-50 MCG/DOSE AEPB Inhale 1 puff into the lungs as needed. Patient taking differently: Inhale 1 puff into the lungs as needed (for shortness of breath).  04/23/13  Yes [name], MD  gabapentin (NEURONTIN) 300 MG capsule Take 300 mg by mouth daily as needed (for pain/neuropathy).  02/23/14  Yes Historical Provider, MD  lisinopril (PRINIVIL,ZESTRIL) 20 MG tablet Take 20 mg by mouth daily.   Yes Historical Provider, MD  pravastatin (PRAVACHOL) 40 MG tablet Take 1 tablet by mouth every morning.  07/06/13  Yes Historical Provider, MD  VENTOLIN HFA 108 (90 BASE) MCG/ACT inhaler Inhale 1-2 puffs into the lungs every 6 (six) hours as needed for wheezing or shortness of breath.  05/10/14  Yes Historical Provider, MD  warfarin (COUMADIN) 4 MG tablet Take 4-5 mg by mouth every morning. Alternate taking 4mg  with 5mg  every day in the morning 05/06/14  Yes Historical Provider, MD  warfarin (COUMADIN) 5 MG tablet Take 1 tablet (5 mg total) by mouth daily. Patient taking differently: Take 4-5 mg by mouth See admin instructions. Alternate taking 4mg  with 5mg  daily in the morning 04/23/13  Yes [name], MD  furosemide (LASIX) 20 MG tablet Take 1 tablet (20 mg total)  by mouth daily. Patient not taking: Reported on 05/30/2014 08/16/13   [name], MD  furosemide (LASIX) 40 MG tablet Take 40 mg by mouth daily.  02/13/14   Historical Provider, MD  levofloxacin (LEVAQUIN) 750 MG tablet Take 1 tablet (750 mg total) by mouth daily. Patient not taking: Reported on 05/30/2014 05/14/14   Rolland Porter, MD  predniSONE (DELTASONE) 20 MG tablet Take 3 po QD x 3d , then 2 po QD x 3d then 1 po QD x 3d Patient not taking: Reported on 05/30/2014 05/14/14   Rolland Porter, MD    Current Facility-Administered  Medications  Medication Dose Route Frequency Provider Last Rate Last Dose  . 0.9 %  sodium chloride infusion  1,000 mL Intravenous Continuous Lily Kocher, PA-C      . 0.9 %  sodium chloride infusion  10 mL/hr Intravenous Once Lily Kocher, PA-C      . 0.9 %  sodium chloride infusion  10 mL/hr Intravenous Once Lily Kocher, PA-C       Current Outpatient Prescriptions  Medication Sig Dispense Refill  . carvedilol (COREG) 6.25 MG tablet Take 1 tablet (6.25 mg total) by mouth 2 (two) times daily with a meal. 180 tablet 3  . dextromethorphan-guaiFENesin (MUCINEX DM) 30-600 MG per 12 hr tablet Take 1 tablet by mouth 2 (two) times daily as needed for cough. 20 tablet 0  . ferrous sulfate 325 (65 FE) MG tablet Take 325 mg by mouth daily with breakfast.    . Fluticasone-Salmeterol (ADVAIR DISKUS) 250-50 MCG/DOSE AEPB Inhale 1 puff into the lungs as needed. (Patient taking differently: Inhale 1 puff into the lungs as needed (for shortness of breath). ) 60 each 1  . gabapentin (NEURONTIN) 300 MG capsule Take 300 mg by mouth daily as needed (for pain/neuropathy).     Marland Kitchen lisinopril (PRINIVIL,ZESTRIL) 20 MG tablet Take 20 mg by mouth daily.    . pravastatin (PRAVACHOL) 40 MG tablet Take 1 tablet by mouth every morning.     . VENTOLIN HFA 108 (90 BASE) MCG/ACT inhaler Inhale 1-2 puffs into the lungs every 6 (six) hours as needed for wheezing or shortness of breath.     . warfarin (COUMADIN) 4 MG tablet Take 4-5 mg by mouth every morning. Alternate taking 4mg  with 5mg  every day in the morning    . warfarin (COUMADIN) 5 MG tablet Take 1 tablet (5 mg total) by mouth daily. (Patient taking differently: Take 4-5 mg by mouth See admin instructions. Alternate taking 4mg  with 5mg  daily in the morning) 90 tablet 3  . furosemide (LASIX) 20 MG tablet Take 1 tablet (20 mg total) by mouth daily. (Patient not taking: Reported on 05/30/2014) 90 tablet 3  . furosemide (LASIX) 40 MG tablet Take 40 mg by mouth daily.     Marland Kitchen  levofloxacin (LEVAQUIN) 750 MG tablet Take 1 tablet (750 mg total) by mouth daily. (Patient not taking: Reported on 05/30/2014) 7 tablet 0  . predniSONE (DELTASONE) 20 MG tablet Take 3 po QD x 3d , then 2 po QD x 3d then 1 po QD x 3d (Patient not taking: Reported on 05/30/2014) 18 tablet 0    Allergies as of 05/30/2014  . (No Known Allergies)    No family history on file.  History   Social History  . Marital Status: Married    Spouse Name: N/A  . Number of Children: N/A  . Years of Education: N/A   Occupational History  . Not on file.  Social History Main Topics  . Smoking status: Former Smoker -- 2.00 packs/day for 20 years    Types: Cigarettes    Start date: 09/01/1959    Quit date: 09/01/1978  . Smokeless tobacco: Never Used     Comment: quit 30 years ago  . Alcohol Use: No  . Drug Use: No  . Sexual Activity: Not on file   Other Topics Concern  . Not on file   Social History Narrative    Review of Systems: General: Negative for anorexia, weight loss, fever, chills. Admits fatigue, weakness, and syncope as per HPI. Eyes: Negative for vision changes.  ENT: Negative for hoarseness, difficulty swallowing. CV: Negative for chest pain, angina, palpitations. Respiratory: Denies cough, sputum, wheezing.  GI: See history of present illness. MS: Negative for joint pain, low back pain.  Derm: Negative for rash or itching.  Neuro: Negative for seizure, frequent headaches, memory loss, confusion.  Psych: Negative for anxiety, depression.  Endo: Negative for unusual weight change.  Heme: increased bruising and bleeding as noted above. Allergy: Negative for rash or hives.   Physical Exam: Vital signs in last 24 hours: Temp:  [97.9 F (36.6 C)-98.3 F (36.8 C)] 97.9 F (36.6 C) (03/24 1203) Pulse Rate:  [105-118] 108 (03/24 1203) Resp:  [14-28] 16 (03/24 1203) BP: (88-105)/(36-50) 97/45 mmHg (03/24 1203) SpO2:  [99 %-100 %] 100 % (03/24 1203) Weight:  [230 lb  (104.327 kg)] 230 lb (104.327 kg) (03/24 8250)   General:   Alert,  Well-developed, well-nourished, pleasant and cooperative in NAD. Appears fatigued and pale. Head:  Normocephalic and atraumatic. Eyes:  Sclera clear, no icterus. Conjunctiva pale.. Ears:  Normal auditory acuity. Mouth:  No deformity or lesions. Neck:  Supple; no masses or thyromegaly. Lungs:  Clear throughout to auscultation.   No wheezes, crackles, or rhonchi. No acute distress. Heart:  Regular rate and rhythm; clicks, rubs,  or gallops. Systolic murmur noted. Abdomen:  Rounded, soft, nontender and nondistended. No masses, hepatosplenomegaly or hernias noted. Normal bowel sounds, without guarding, and without rebound.   Rectal:  Deferred.   Msk:  Symmetrical without gross deformities. Normal posture. Pulses:  Normal pulses noted. Extremities:  Without clubbing or edema. Neurologic:  Alert and  oriented x4;  grossly normal neurologically. Skin:  Intact without significant lesions or rashes. Cervical Nodes:  No significant cervical adenopathy. Psych:  Alert and cooperative. Normal mood and affect.  Intake/Output from previous day:   Intake/Output this shift: Total I/O In: 1334 [I.V.:1067; Blood:267] Out: -   Lab Results:  Recent Labs  05/30/14 0855  WBC 15.8*  HGB 4.3*  HCT 13.1*  PLT 246   BMET  Recent Labs  05/30/14 0855  NA 137  K 4.4  CL 108  CO2 23  GLUCOSE 119*  BUN 93*  CREATININE 1.80*  CALCIUM 7.9*   LFT  Recent Labs  05/30/14 0855  PROT 5.0*  ALBUMIN 2.3*  AST 14  ALT 15  ALKPHOS 35*  BILITOT 0.4   PT/INR  Recent Labs  05/30/14 0855  LABPROT 58.2*  INR 6.61*   Hepatitis Panel No results for input(s): HEPBSAG, HCVAB, HEPAIGM, HEPBIGM in the last 72 hours. C-Diff No results for input(s): CDIFFTOX in the last 72 hours.  Studies/Results: Dg Chest Portable 1 View  05/30/2014   CLINICAL DATA:  Shortness of Breath  EXAM: PORTABLE CHEST - 1 VIEW  COMPARISON:  05/13/2014   FINDINGS: Cardiomediastinal silhouette is stable. No acute infiltrate or pleural effusion. No pulmonary edema.  Stable blunting bilateral cardiophrenic angle. Mild degenerative changes thoracic spine.  IMPRESSION: No active disease.   Electronically Signed   By: Lahoma Crocker M.D.   On: 05/30/2014 09:19    Impression: 69 year old male with AFlutter on coumadin with coagulopathy and 2 weeks of dark melanous stools and 1 week of symptoms suggestive of worsening anemia. Hgb today 4.3 and INR 6.6. BP is soft in the mid 90s to low 017P systolic and HR 102-585. Currently receiving 2nd of 2 units FFP ordered, will begin PRBCs next per nursing staff. Last colonoscopy normal (per patient), no upper GI symptoms, has never had an EGD.   Plan: Discussed case with Dr. Oneida Alar.  1. Continue coumadin reversal 2. 10 mg IV vitamin K 3. Transfuse as necessary 4. Supportive measures 5. Plan for EGD tomorrow 6. Continue to monitor for recurrent GI bleeding 7. Continue to monitor H/H    Walden Field, AGNP-C Adult & Gerontological Nurse Practitioner [facility]    LOS: 0 days     05/30/2014, 12:16 PM

## 2014-05-30 NOTE — ED Notes (Signed)
CRITICAL VALUE ALERT  Critical value received:  Hemoglobin  Date of notification:  05/30/14  Time of notification:  0930  Critical value read back:Yes.    Nurse who received alert:  Allegra Lai, RN  MD notified (1st page):  Dr Jeneen Rinks and Lily Kocher, PA  Time of first page:  [phone]

## 2014-05-30 NOTE — H&P (Signed)
[facility]          History and Physical    PCP:   [name], ANTHONY T, PA-C   Chief Complaint:  Weakness, shortness of breath  HPI: Patient is a 69 year old gentleman with history significant for cardiomyopathy, moderate aortic stenosis, mild mitral regurgitation, hypertension, atrial flutter on chronic anticoagulation with Coumadin who presents to the hospital today with the above-mentioned complaints. He and his wife stated that for the past 3-4 weeks he has been increasingly fatigued, getting extremely short of breath. This morning was unable to get out of bed and get to the hospital for evaluation. On further questioning he does note some really dark stools for the past 2 weeks. He states he went to visit his primary care physician and they gave him several courses of antibiotics for what was presumed to be bronchitis. In the hospital he is found to have a hemoglobin of 4.6 and coagulopathic with an INR of 6. He has been given FFP, we have been asked to admit him for further evaluation and management.  Allergies:  No Known Allergies    Past Medical History  Diagnosis Date  . Asthma   . Hypertension   . COPD (chronic obstructive pulmonary disease)   . Cancer     Prostate     Past Surgical History  Procedure Laterality Date  . Prostate surgery    . Hernia repair      Prior to Admission medications   Medication Sig Start Date End Date Taking? Authorizing Provider  carvedilol (COREG) 6.25 MG tablet Take 1 tablet (6.25 mg total) by mouth 2 (two) times daily with a meal. 05/23/13  Yes [name], MD  dextromethorphan-guaiFENesin (MUCINEX DM) 30-600 MG per 12 hr tablet Take 1 tablet by mouth 2 (two) times daily as needed for cough. 05/14/14  Yes Rolland Porter, MD  ferrous sulfate 325 (65 FE) MG tablet Take 325 mg by mouth daily with breakfast.   Yes Historical Provider, MD  Fluticasone-Salmeterol (ADVAIR DISKUS) 250-50 MCG/DOSE AEPB Inhale 1 puff into  the lungs as needed. Patient taking differently: Inhale 1 puff into the lungs as needed (for shortness of breath).  04/23/13  Yes [name], MD  gabapentin (NEURONTIN) 300 MG capsule Take 300 mg by mouth daily as needed (for pain/neuropathy).  02/23/14  Yes Historical Provider, MD  lisinopril (PRINIVIL,ZESTRIL) 20 MG tablet Take 20 mg by mouth daily.   Yes Historical Provider, MD  pravastatin (PRAVACHOL) 40 MG tablet Take 1 tablet by mouth every morning.  07/06/13  Yes Historical Provider, MD  VENTOLIN HFA 108 (90 BASE) MCG/ACT inhaler Inhale 1-2 puffs into the lungs every 6 (six) hours as needed for wheezing or shortness of breath.  05/10/14  Yes Historical Provider, MD  warfarin (COUMADIN) 4 MG tablet Take 4-5 mg by mouth every morning. Alternate taking 62m with 541mevery day in the morning 05/06/14  Yes Historical Provider, MD  warfarin (COUMADIN) 5 MG tablet Take 1 tablet (5 mg total) by mouth daily. Patient taking differently: Take 4-5 mg by mouth See admin instructions. Alternate taking 70m76mith 5mg92mily in the morning 04/23/13  Yes SureHerminio Commons  furosemide (LASIX) 20 MG tablet Take 1 tablet (20 mg total) by mouth daily. Patient not taking: Reported on 05/30/2014 08/16/13   SureHerminio Commons  furosemide (LASIX) 40 MG tablet Take 40 mg by mouth daily.  02/13/14  Historical Provider, MD  levofloxacin (LEVAQUIN) 750 MG tablet Take 1 tablet (750 mg total) by mouth daily. Patient not taking: Reported on 05/30/2014 05/14/14   Rolland Porter, MD  predniSONE (DELTASONE) 20 MG tablet Take 3 po QD x 3d , then 2 po QD x 3d then 1 po QD x 3d Patient not taking: Reported on 05/30/2014 05/14/14   Rolland Porter, MD    Social History:  reports that he quit smoking about 35 years ago. His smoking use included Cigarettes. He started smoking about 54 years ago. He has a 40 pack-year smoking history. He has never used smokeless tobacco. He reports that he does not drink alcohol or use illicit  drugs.  Family History: HTN in both parents.  Review of Systems:  Constitutional: Denies fever, chills, diaphoresis, appetite change. HEENT: Denies photophobia, eye pain, redness, hearing loss, ear pain, congestion, sore throat, rhinorrhea, sneezing, mouth sores, trouble swallowing, neck pain, neck stiffness and tinnitus.   Respiratory: Denies  cough, chest tightness,  and wheezing.   Cardiovascular: Denies chest pain, palpitations and leg swelling.  Gastrointestinal: Denies nausea, vomiting, abdominal pain, diarrhea, constipation, blood in stool and abdominal distention.  Genitourinary: Denies dysuria, urgency, frequency, hematuria, flank pain and difficulty urinating.  Endocrine: Denies: hot or cold intolerance, sweats, changes in hair or nails, polyuria, polydipsia. Musculoskeletal: Denies myalgias, back pain, joint swelling, arthralgias and gait problem.  Skin: Denies pallor, rash and wound.  Neurological: Denies dizziness, seizures, syncope, weakness, light-headedness, numbness and headaches.  Hematological: Denies adenopathy. Easy bruising, personal or family bleeding history  Psychiatric/Behavioral: Denies suicidal ideation, mood changes, confusion, nervousness, sleep disturbance and agitation   Physical Exam: Blood pressure 97/45, pulse 108, temperature 97.9 F (36.6 C), temperature source Oral, resp. rate 16, height 5' 6"  (1.676 m), weight 104.327 kg (230 lb), SpO2 100 %. General: Alert, awake, oriented 3, no current distress, pale. HEENT: Normocephalic, atraumatic, pupils equal round and reactive to light, extraocular movements intact, dry mucous membranes, mild pharyngeal erythema, scleral and conjunctival pallor. Neck: Supple, no JVD, no lymphadenopathy, no bruits, no goiter. Cardiovascular: Regular rate and rhythm, no murmurs, rubs or gallops.  Lungs: Clear to auscultation bilaterally  Abdomen: Soft, nontender, nondistended, positive bowel sounds, no masses or organomegaly  noted. Extremities: No clubbing, cyanosis or edema, positive pedal pulses. Neurologic: Grossly intact and nonfocal.  Labs on Admission:  Results for orders placed or performed during the hospital encounter of 05/30/14 (from the past 48 hour(s))  Protime-INR (if patient is taking Coumadin)     Status: Abnormal   Collection Time: 05/30/14  8:55 AM  Result Value Ref Range   Prothrombin Time 58.2 (H) 11.6 - 15.2 seconds    Comment: RESULT REPEATED AND VERIFIED   INR 6.61 (HH) 0.00 - 1.49    Comment: RESULT REPEATED AND VERIFIED CRITICAL RESULT CALLED TO, READ BACK BY AND VERIFIED WITH: Randell Loop RN ON [phone] AT 0940 BY RESSEGGER R   CBC     Status: Abnormal   Collection Time: 05/30/14  8:55 AM  Result Value Ref Range   WBC 15.8 (H) 4.0 - 10.5 K/uL   RBC 1.45 (L) 4.22 - 5.81 MIL/uL   Hemoglobin 4.3 (LL) 13.0 - 17.0 g/dL    Comment: RESULT REPEATED AND VERIFIED CRITICAL RESULT CALLED TO, READ BACK BY AND VERIFIED WITH: DAPHYNE ANDERSONRN ON [id number] AT 0930 BY RESSEGGER R    HCT 13.1 (L) 39.0 - 52.0 %   MCV 90.3 78.0 - 100.0 fL   MCH 29.7 26.0 -  34.0 pg   MCHC 32.8 30.0 - 36.0 g/dL   RDW 14.8 11.5 - 15.5 %   Platelets 246 150 - 400 K/uL  Comprehensive metabolic panel     Status: Abnormal   Collection Time: 05/30/14  8:55 AM  Result Value Ref Range   Sodium 137 135 - 145 mmol/L   Potassium 4.4 3.5 - 5.1 mmol/L   Chloride 108 96 - 112 mmol/L   CO2 23 19 - 32 mmol/L   Glucose, Bld 119 (H) 70 - 99 mg/dL   BUN 93 (H) 6 - 23 mg/dL   Creatinine, Ser 1.80 (H) 0.50 - 1.35 mg/dL   Calcium 7.9 (L) 8.4 - 10.5 mg/dL   Total Protein 5.0 (L) 6.0 - 8.3 g/dL   Albumin 2.3 (L) 3.5 - 5.2 g/dL   AST 14 0 - 37 U/L   ALT 15 0 - 53 U/L   Alkaline Phosphatase 35 (L) 39 - 117 U/L   Total Bilirubin 0.4 0.3 - 1.2 mg/dL   GFR calc non Af Amer 37 (L) >90 mL/min   GFR calc Af Amer 43 (L) >90 mL/min    Comment: (NOTE) The eGFR has been calculated using the CKD EPI equation. This calculation has not  been validated in all clinical situations. eGFR's persistently <90 mL/min signify possible Chronic Kidney Disease.    Anion gap 6 5 - 15  Type and screen     Status: None (Preliminary result)   Collection Time: 05/30/14  8:55 AM  Result Value Ref Range   ABO/RH(D) O POS    Antibody Screen NEG    Sample Expiration 06/02/2014    Unit Number [id number]    Blood Component Type RED CELLS,LR    Unit division 00    Status of Unit ALLOCATED    Transfusion Status OK TO TRANSFUSE    Crossmatch Result Compatible    Unit Number [id number]    Blood Component Type RED CELLS,LR    Unit division 00    Status of Unit ALLOCATED    Transfusion Status OK TO TRANSFUSE    Crossmatch Result Compatible   ABO/Rh     Status: None   Collection Time: 05/30/14  8:55 AM  Result Value Ref Range   ABO/RH(D) O POS   Prepare RBC     Status: None   Collection Time: 05/30/14  8:55 AM  Result Value Ref Range   Order Confirmation ORDER PROCESSED BY BLOOD BANK   Prepare fresh frozen plasma     Status: None (Preliminary result)   Collection Time: 05/30/14  8:55 AM  Result Value Ref Range   Unit Number [id number]    Blood Component Type THAWED PLASMA    Unit division 00    Status of Unit ISSUED    Transfusion Status OK TO TRANSFUSE    Unit Number [id number]    Blood Component Type THAWED PLASMA    Unit division 00    Status of Unit ISSUED    Transfusion Status OK TO TRANSFUSE   I-Stat CG4 Lactic Acid, ED     Status: None   Collection Time: 05/30/14  9:14 AM  Result Value Ref Range   Lactic Acid, Venous 0.92 0.5 - 2.0 mmol/L    Radiological Exams on Admission: Dg Chest Portable 1 View  05/30/2014   CLINICAL DATA:  Shortness of Breath  EXAM: PORTABLE CHEST - 1 VIEW  COMPARISON:  05/13/2014  FINDINGS: Cardiomediastinal silhouette is stable. No acute infiltrate or pleural effusion. No pulmonary  edema. Stable blunting bilateral cardiophrenic angle. Mild degenerative changes thoracic spine.   IMPRESSION: No active disease.   Electronically Signed   By: Lahoma Crocker M.D.   On: 05/30/2014 09:19    Assessment/Plan Principal Problem:   GI bleed Active Problems:   Acute blood loss anemia   HTN (hypertension)   High cholesterol   Atrial flutter   Coagulopathy   GI bleed  -Presumed upper source given black stools. -Protonix IV twice a day. -We'll revert INR with FFP. -3 units of PRBCs will be ordered. -Patient will be kept nothing by mouth in anticipation of GI consultation with possible EGD.  Acute blood loss anemia -Secondary to GI bleed. -We'll be transfuse 3 units of PRBCs for hemoglobin of 4.6.  Atrial flutter -Currently rate controlled. -Coumadin currently being reversed given active GI bleed.  Hypertension -He is actually hypotensive currently, hold all BP meds and giving IV fluids and blood.  Coagulopathy -Possibly related to multiple antibiotic courses in a patient chronically takes Coumadin. -FFP to be given.  Hyperlipidemia -Holding statin given nothing by mouth state.  DVT prophylaxis -SCDs given ongoing bleed.  CODE STATUS -Full code as discussed with patient and wife Thayer Headings at bedside.    Time Spent on Admission: 85 minutes  HERNANDEZ ACOSTA,Bryella Diviney [facility] Pager: [phone] 05/30/2014, 12:04 PM

## 2014-05-30 NOTE — ED Notes (Signed)
CRITICAL VALUE ALERT  Critical value received:  PT 58.2 INR 6.61  Date of notification: 05/27/2014  Time of notification: 0944  Critical value read back:yes  Nurse who received alert:  Di Kindle  MD notified (1st page): Lily Kocher, PA  Time of first page:  [phone]  MD notified (2nd page):  Time of second page:  Responding MD:  HB, PA  Time MD responded: [phone]

## 2014-05-31 ENCOUNTER — Encounter (HOSPITAL_COMMUNITY): Payer: Self-pay

## 2014-05-31 ENCOUNTER — Encounter (HOSPITAL_COMMUNITY)
Admission: EM | Disposition: A | Discharge status: Home / Self Care | Payer: Self-pay | Source: Home / Self Care | Attending: Internal Medicine

## 2014-05-31 DIAGNOSIS — K263 Acute duodenal ulcer without hemorrhage or perforation: Secondary | ICD-10-CM

## 2014-05-31 DIAGNOSIS — K264 Chronic or unspecified duodenal ulcer with hemorrhage: Secondary | ICD-10-CM | POA: Diagnosis not present

## 2014-05-31 HISTORY — PX: ESOPHAGOGASTRODUODENOSCOPY: SHX5428

## 2014-05-31 LAB — PREPARE FRESH FROZEN PLASMA
Unit division: 0
Unit division: 0

## 2014-05-31 LAB — BASIC METABOLIC PANEL
Anion gap: 4 — ABNORMAL LOW (ref 5–15)
BUN: 55 mg/dL — AB (ref 6–23)
CALCIUM: 8 mg/dL — AB (ref 8.4–10.5)
CO2: 25 mmol/L (ref 19–32)
CREATININE: 1.11 mg/dL (ref 0.50–1.35)
Chloride: 112 mmol/L (ref 96–112)
GFR, EST AFRICAN AMERICAN: 76 mL/min — AB (ref >90)
GFR, EST NON AFRICAN AMERICAN: 66 mL/min — AB (ref >90)
Glucose, Bld: 109 mg/dL — ABNORMAL HIGH (ref 70–99)
POTASSIUM: 4.7 mmol/L (ref 3.5–5.1)
Sodium: 141 mmol/L (ref 135–145)

## 2014-05-31 LAB — CBC
HCT: 20.4 % — ABNORMAL LOW (ref 39.0–52.0)
HEMATOCRIT: 23.7 % — AB (ref 39.0–52.0)
Hemoglobin: 7 g/dL — ABNORMAL LOW (ref 13.0–17.0)
Hemoglobin: 8.3 g/dL — ABNORMAL LOW (ref 13.0–17.0)
MCH: 30.3 pg (ref 26.0–34.0)
MCH: 31.3 pg (ref 26.0–34.0)
MCHC: 34.3 g/dL (ref 30.0–36.0)
MCHC: 35 g/dL (ref 30.0–36.0)
MCV: 88.3 fL (ref 78.0–100.0)
MCV: 89.4 fL (ref 78.0–100.0)
Platelets: 179 10*3/uL (ref 150–400)
Platelets: 189 10*3/uL (ref 150–400)
RBC: 2.31 MIL/uL — ABNORMAL LOW (ref 4.22–5.81)
RBC: 2.65 MIL/uL — ABNORMAL LOW (ref 4.22–5.81)
RDW: 14.3 % (ref 11.5–15.5)
RDW: 14.4 % (ref 11.5–15.5)
WBC: 18.1 10*3/uL — ABNORMAL HIGH (ref 4.0–10.5)
WBC: 15.9 10*3/uL — AB (ref 4.0–10.5)

## 2014-05-31 SURGERY — EGD (ESOPHAGOGASTRODUODENOSCOPY)
Anesthesia: Moderate Sedation

## 2014-05-31 MED ORDER — SODIUM CHLORIDE 0.9 % IV SOLN
INTRAVENOUS | Status: DC
  Administered 2014-05-31: 20:00:00 via INTRAVENOUS

## 2014-05-31 MED ORDER — MEPERIDINE HCL 100 MG/ML IJ SOLN
INTRAMUSCULAR | Status: DC | PRN
  Administered 2014-05-31: 25 mg via INTRAVENOUS

## 2014-05-31 MED ORDER — SODIUM CHLORIDE 0.9 % IV SOLN: Freq: Once | INTRAVENOUS | Status: DC

## 2014-05-31 MED ORDER — MOMETASONE FURO-FORMOTEROL FUM 100-5 MCG/ACT IN AERO
2.0000 | INHALATION_SPRAY | Freq: Two times a day (BID) | RESPIRATORY_TRACT | Status: DC
  Administered 2014-05-31 – 2014-06-01 (×2): 2 via RESPIRATORY_TRACT
  Filled 2014-05-31: qty 8.8

## 2014-05-31 MED ORDER — MEPERIDINE HCL 100 MG/ML IJ SOLN
INTRAMUSCULAR | Status: AC
  Filled 2014-05-31: qty 2

## 2014-05-31 MED ORDER — PANTOPRAZOLE SODIUM 40 MG PO TBEC
40.0000 mg | DELAYED_RELEASE_TABLET | Freq: Two times a day (BID) | ORAL | Status: DC
  Administered 2014-05-31 – 2014-06-01 (×3): 40 mg via ORAL
  Filled 2014-05-31 (×3): qty 1

## 2014-05-31 MED ORDER — MIDAZOLAM HCL 5 MG/5ML IJ SOLN
INTRAMUSCULAR | Status: DC | PRN
  Administered 2014-05-31: 1 mg via INTRAVENOUS
  Administered 2014-05-31: 2 mg via INTRAVENOUS

## 2014-05-31 MED ORDER — LIDOCAINE VISCOUS 2 % MT SOLN
OROMUCOSAL | Status: AC
  Filled 2014-05-31: qty 15

## 2014-05-31 MED ORDER — MIDAZOLAM HCL 5 MG/5ML IJ SOLN
INTRAMUSCULAR | Status: AC
  Filled 2014-05-31: qty 10

## 2014-05-31 MED ORDER — ZOLPIDEM TARTRATE 5 MG PO TABS
5.0000 mg | ORAL_TABLET | Freq: Every day | ORAL | Status: DC
  Administered 2014-05-31: 5 mg via ORAL
  Filled 2014-05-31: qty 1

## 2014-05-31 MED ORDER — STERILE WATER FOR IRRIGATION IR SOLN
Status: DC | PRN
  Administered 2014-05-31: 10:00:00

## 2014-05-31 MED ORDER — ALBUTEROL SULFATE (2.5 MG/3ML) 0.083% IN NEBU
3.0000 mL | INHALATION_SOLUTION | Freq: Four times a day (QID) | RESPIRATORY_TRACT | Status: DC | PRN
  Administered 2014-05-31 – 2014-06-01 (×2): 3 mL via RESPIRATORY_TRACT
  Filled 2014-05-31 (×2): qty 3

## 2014-05-31 MED ORDER — MOMETASONE FURO-FORMOTEROL FUM 100-5 MCG/ACT IN AERO
INHALATION_SPRAY | RESPIRATORY_TRACT | Status: AC
  Filled 2014-05-31: qty 8.8

## 2014-05-31 MED ORDER — LIDOCAINE VISCOUS 2 % MT SOLN
OROMUCOSAL | Status: DC | PRN
  Administered 2014-05-31: 1 via OROMUCOSAL

## 2014-05-31 NOTE — Care Management Note (Signed)
    Page 1 of 1   05/31/2014     2:52:30 PM CARE MANAGEMENT NOTE 05/31/2014  Patient:  Ryan Shea, Ryan Shea   Account Number:  [id number]  Date Initiated:  05/31/2014  Documentation initiated by:  Jolene Provost  Subjective/Objective Assessment:   Pt is from home, lives with wife. Pt has cane that he uses PRN. Pt says he has to get his wife to help him with some things like bathing but he is mostly independent. Pt has no HH services. Pt has a neb machine and uses his wifes O2 as need     Action/Plan:   Pt plans to discharge home with self care. Pt needs home O2 eval prior to discharge. Pt says he needs a CPAP machine because he doesnt sleep well at night, I instructed pt to discuss that with his PCP.   Anticipated DC Date:  06/03/2014   Anticipated DC Plan:  [place]  CM consult      PAC Choice  DURABLE MEDICAL EQUIPMENT   Choice offered to / List presented to:  NA   DME arranged  [facility]      DME agency  [place]        Status of service:  Completed, signed off Medicare Important Message given?  YES (If response is "NO", the following Medicare IM given date fields will be blank) Date Medicare IM given:  05/31/2014 Medicare IM given by:  Jolene Provost Date Additional Medicare IM given:   Additional Medicare IM given by:    Discharge Disposition:  HOME/SELF CARE  Per UR Regulation:  Reviewed for med. necessity/level of care/duration of stay  If discussed at [place] of Stay Meetings, dates discussed:    Comments:  05/31/2014 Rowena, RN, MSN, CM Instructions left for nursing staff to arrange for home O2 over weekend if needed.

## 2014-05-31 NOTE — Progress Notes (Signed)
Bleeding precautions reinforced with patient, and the need for weekly lab draws while on coumadin. Patient was able to "teach back" information.

## 2014-05-31 NOTE — H&P (Signed)
Primary Care Physician:  Mackey Birchwood Primary Gastroenterologist:  Dr. Oneida Alar  Pre-Procedure History & Physical: HPI:  Ryan Shea is a 69 y.o. male here for MELENA.  Past Medical History  Diagnosis Date  . Asthma   . Hypertension   . COPD (chronic obstructive pulmonary disease)   . Cancer     Prostate     Past Surgical History  Procedure Laterality Date  . Prostate surgery    . Hernia repair      Prior to Admission medications   Medication Sig Start Date End Date Taking? Authorizing Provider  carvedilol (COREG) 6.25 MG tablet Take 1 tablet (6.25 mg total) by mouth 2 (two) times daily with a meal. 05/23/13  Yes [name], MD  dextromethorphan-guaiFENesin (MUCINEX DM) 30-600 MG per 12 hr tablet Take 1 tablet by mouth 2 (two) times daily as needed for cough. 05/14/14  Yes Rolland Porter, MD  ferrous sulfate 325 (65 FE) MG tablet Take 325 mg by mouth daily with breakfast.   Yes Historical Provider, MD  Fluticasone-Salmeterol (ADVAIR DISKUS) 250-50 MCG/DOSE AEPB Inhale 1 puff into the lungs as needed. Patient taking differently: Inhale 1 puff into the lungs as needed (for shortness of breath).  04/23/13  Yes [name], MD  gabapentin (NEURONTIN) 300 MG capsule Take 300 mg by mouth daily as needed (for pain/neuropathy).  02/23/14  Yes Historical Provider, MD  lisinopril (PRINIVIL,ZESTRIL) 20 MG tablet Take 20 mg by mouth daily.   Yes Historical Provider, MD  pravastatin (PRAVACHOL) 40 MG tablet Take 1 tablet by mouth every morning.  07/06/13  Yes Historical Provider, MD  VENTOLIN HFA 108 (90 BASE) MCG/ACT inhaler Inhale 1-2 puffs into the lungs every 6 (six) hours as needed for wheezing or shortness of breath.  05/10/14  Yes Historical Provider, MD  warfarin (COUMADIN) 4 MG tablet Take 4 mg by mouth every other day. Alternate taking 4mg  with 5mg  every day in the morning 05/06/14  Yes Historical Provider, MD  warfarin (COUMADIN) 5 MG tablet Take 1 tablet (5 mg  total) by mouth daily. Patient taking differently: Take 5 mg by mouth every other day. Alternate taking 4mg  with 5mg  daily in the morning 04/23/13  Yes [name], MD  furosemide (LASIX) 20 MG tablet Take 1 tablet (20 mg total) by mouth daily. Patient not taking: Reported on 05/30/2014 08/16/13   [name], MD  furosemide (LASIX) 40 MG tablet Take 40 mg by mouth daily.  02/13/14   Historical Provider, MD  levofloxacin (LEVAQUIN) 750 MG tablet Take 1 tablet (750 mg total) by mouth daily. Patient not taking: Reported on 05/30/2014 05/14/14   Rolland Porter, MD  predniSONE (DELTASONE) 20 MG tablet Take 3 po QD x 3d , then 2 po QD x 3d then 1 po QD x 3d Patient not taking: Reported on 05/30/2014 05/14/14   Rolland Porter, MD    Allergies as of 05/30/2014  . (No Known Allergies)    History reviewed. No pertinent family history.  History   Social History  . Marital Status: Married    Spouse Name: N/A  . Number of Children: N/A  . Years of Education: N/A   Occupational History  . Not on file.   Social History Main Topics  . Smoking status: Former Smoker -- 2.00 packs/day for 20 years    Types: Cigarettes    Start date: 09/01/1959    Quit date: 09/01/1978  . Smokeless tobacco: Never Used  Comment: quit 30 years ago  . Alcohol Use: No  . Drug Use: No  . Sexual Activity: Not on file   Other Topics Concern  . Not on file   Social History Narrative    Review of Systems: See HPI, otherwise negative ROS   Physical Exam: BP 125/55 mmHg  Pulse 112  Temp(Src) 98.8 F (37.1 C) (Oral)  Resp 16  Ht 5\' 6"  (1.676 m)  Wt 233 lb 14.5 oz (106.1 kg)  BMI 37.77 kg/m2  SpO2 99% General:   Alert,  pleasant and cooperative in NAD Head:  Normocephalic and atraumatic. Neck:  Supple; Lungs:  Clear throughout to auscultation.    Heart:  Regular rate and rhythm. Abdomen:  Soft, nontender and nondistended. Normal bowel sounds, without guarding, and without rebound.   Neurologic:  Alert  and  oriented x4;  grossly normal neurologically.  Impression/Plan:    MELENA  PLAN: 1. EGD TODAY

## 2014-05-31 NOTE — Progress Notes (Signed)
[facility] PROGRESS NOTE  Ryan Shea WSF:[id number] DOB: Nov 21, 1945 DOA: 05/30/2014 PCP: Bronson Curb, PA-C  Assessment/Plan: Upper GI Bleed -Duodenal Ulcers as seen on EGD. -Continue BID PPI. -Will need to discuss when/if safe to resume coumadin.  Acute Blood Loss Anemia -2/2 GI Bleed. -Hb 4.3 on admission. -Up to 7.0 s/p 3 units of PRBCs. -Will give 1 more unit today.  Coagulopathy -Coumadin has been reversed with vit K/FFP.  A Flutter -Rate controlled. -Coumadin on hold 2/2 GI Bleed.  HTN -Meds on hold. -Consider restarting slowly in am.  Code Status: Full Code Family Communication: Multiple family members at bedside updated on plan of care  Disposition Plan: Home when ready. Transfer to floor today.   Consultants:  GI   Antibiotics:  None   Subjective: Feels much better today.  Objective: Filed Vitals:   05/31/14 1020 05/31/14 1025 05/31/14 1030 05/31/14 1035  BP: 124/48 113/27 104/35 114/34  Pulse: 117 111 111 111  Temp:      TempSrc:      Resp: 19 14 16 17   Height:      Weight:      SpO2: 98% 97% 98% 98%    Intake/Output Summary (Last 24 hours) at 05/31/14 1217 Last data filed at 05/31/14 0600  Gross per 24 hour  Intake   2390 ml  Output   2800 ml  Net   -410 ml   Filed Weights   05/30/14 0822 05/30/14 1210 05/31/14 0500  Weight: 104.327 kg (230 lb) 105.8 kg (233 lb 4 oz) 106.1 kg (233 lb 14.5 oz)    Exam:   General:  AA Ox3  Cardiovascular: RRR  Respiratory: CTA B  Abdomen: S/NT/+BS  Extremities: no C/C/E   Neurologic:  Intact/non-focal  Data Reviewed: Basic Metabolic Panel:  Recent Labs Lab 05/30/14 0855 05/31/14 0539  NA 137 141  K 4.4 4.7  CL 108 112  CO2 23 25  GLUCOSE 119* 109*  BUN 93* 55*  CREATININE 1.80* 1.11  CALCIUM 7.9* 8.0*   Liver Function Tests:  Recent Labs Lab 05/30/14 0855  AST 14  ALT 15  ALKPHOS 35*  BILITOT 0.4  PROT 5.0*  ALBUMIN 2.3*   No results for  input(s): LIPASE, AMYLASE in the last 168 hours. No results for input(s): AMMONIA in the last 168 hours. CBC:  Recent Labs Lab 05/30/14 0855 05/31/14 0539  WBC 15.8* 18.1*  HGB 4.3* 7.0*  HCT 13.1* 20.4*  MCV 90.3 88.3  PLT 246 179   Cardiac Enzymes: No results for input(s): CKTOTAL, CKMB, CKMBINDEX, TROPONINI in the last 168 hours. BNP (last 3 results) No results for input(s): BNP in the last 8760 hours.  ProBNP (last 3 results) No results for input(s): PROBNP in the last 8760 hours.  CBG: No results for input(s): GLUCAP in the last 168 hours.  Recent Results (from the past 240 hour(s))  MRSA PCR Screening     Status: None   Collection Time: 05/30/14 12:52 PM  Result Value Ref Range Status   MRSA by PCR NEGATIVE NEGATIVE Final    Comment:        The GeneXpert MRSA Assay (FDA approved for NASAL specimens only), is one component of a comprehensive MRSA colonization surveillance program. It is not intended to diagnose MRSA infection nor to guide or monitor treatment for MRSA infections.      Studies: Dg Chest Portable 1 View  05/30/2014   CLINICAL DATA:  Shortness of Breath  EXAM:  PORTABLE CHEST - 1 VIEW  COMPARISON:  05/13/2014  FINDINGS: Cardiomediastinal silhouette is stable. No acute infiltrate or pleural effusion. No pulmonary edema. Stable blunting bilateral cardiophrenic angle. Mild degenerative changes thoracic spine.  IMPRESSION: No active disease.   Electronically Signed   By: Lahoma Crocker M.D.   On: 05/30/2014 09:19    Scheduled Meds: . sodium chloride   Intravenous Once  . sodium chloride   Intravenous Once  . antiseptic oral rinse  7 mL Mouth Rinse BID  . lidocaine      . meperidine      . midazolam       Continuous Infusions: . sodium chloride 10 mL/hr at 05/31/14 1127    Principal Problem:   GI bleed Active Problems:   Acute blood loss anemia   HTN (hypertension)   High cholesterol   Atrial flutter   Coagulopathy    Time spent: 35  minutes.  Greater than 50% of this time was spent in direct contact with the patient coordinating care.    Lelon Frohlich  [facility] Pager [phone]  If 7PM-7AM, please contact night-coverage at [email or web address], password [email or web address] 05/31/2014, 12:17 PM  LOS: 1 day

## 2014-05-31 NOTE — Op Note (Signed)
[facility] [address] [place], [address]   ENDOSCOPY PROCEDURE REPORT  PATIENT: Ryan Shea, Ryan Shea  MR#: [id number] BIRTHDATE: 12/14/1945 , 75  yrs. old GENDER: male  ENDOSCOPIST: Danie Binder, MD REFERRED OZ:[id number] Alford Highland, PA-C  PROCEDURE DATE: Jun 02, 2014 PROCEDURE:   EGD w/ biopsy  INDICATIONS:melena. INR > 6 ON COUMADIN. MEDICATIONS: Demerol 25 mg IV and Versed 3 mg IV TOPICAL ANESTHETIC:   Viscous Xylocaine ASA CLASS:  DESCRIPTION OF PROCEDURE:     Physical exam was performed.  Informed consent was obtained from the patient after explaining the benefits, risks, and alternatives to the procedure.  The patient was connected to the monitor and placed in the left lateral position.  Continuous oxygen was provided by nasal cannula and IV medicine administered through an indwelling cannula.  After administration of sedation, the patients esophagus was intubated and the EG-[id number] ([id number])  endoscope was advanced under direct visualization to the second portion of the duodenum.  The scope was removed slowly by carefully examining the color, texture, anatomy, and integrity of the mucosa on the way out.  The patient was recovered in endoscopy and discharged home in satisfactory condition.   ESOPHAGUS: The mucosa of the esophagus appeared normal.   STOMACH: Mild non-erosive gastritis (inflammation) was found in the gastric antrum.  Multiple biopsies were performed using cold forceps. DUODENUM: Multiple non-bleeding non-bleeding, clean-based and punctate ulcers, measuring 2 x 22mm in size, with surrounding edema were found in the duodenal bulb.   The duodenal mucosa showed no abnormalities in the 2nd part of the duodenum. COMPLICATIONS: There were no immediate complications.  ENDOSCOPIC IMPRESSION: 1.   UGIB MOST LIKELY DUE TO DUODENAL ULCERS IN SETTING OF ANTICOAGULATION 2.   MILD Non-erosive gastritis  RECOMMENDATIONS: BID PPI LOW FAT DIET HOLD  COUMADIN.  RE-START APR 1. AWAIT BIOPSY OPV IN 4-6 WEEK WITH DR.  Yitta Gongaware AMBIEN QHS FOR SLEEP  REPEAT EXAM: _______________________________ eSignedDanie Binder, MD Jun 02, 2014 2:17 PM    CPT CODES: ICD CODES:  The ICD and CPT codes recommended by this software are interpretations from the data that the clinical staff has captured with the software.  The verification of the translation of this report to the ICD and CPT codes and modifiers is the sole responsibility of the health care institution and practicing physician where this report was generated.  [employer] will not be held responsible for the validity of the ICD and CPT codes included on this report.  AMA assumes no liability for data contained or not contained herein. CPT is a Designer, television/film set of the [employer].

## 2014-05-31 NOTE — Progress Notes (Signed)
Notified MD of patients request for respiratory med inhalers from home.  New orders given and followed.

## 2014-05-31 NOTE — Progress Notes (Signed)
Patient transfered to room 339. Report given to Sharen Hones RN. Vital signs stable at transfer. Patient will receive blood transfusion after transfer.

## 2014-05-31 NOTE — Outcomes Assessment (Signed)
100 of nacl given during procedure. D/c after procedure

## 2014-06-01 DIAGNOSIS — D62 Acute posthemorrhagic anemia: Secondary | ICD-10-CM

## 2014-06-01 DIAGNOSIS — K922 Gastrointestinal hemorrhage, unspecified: Secondary | ICD-10-CM

## 2014-06-01 DIAGNOSIS — I4892 Unspecified atrial flutter: Secondary | ICD-10-CM

## 2014-06-01 LAB — CBC
HEMATOCRIT: 22.6 % — AB (ref 39.0–52.0)
HEMOGLOBIN: 7.8 g/dL — AB (ref 13.0–17.0)
MCH: 31.1 pg (ref 26.0–34.0)
MCHC: 34.5 g/dL (ref 30.0–36.0)
MCV: 90 fL (ref 78.0–100.0)
Platelets: 171 10*3/uL (ref 150–400)
RBC: 2.51 MIL/uL — ABNORMAL LOW (ref 4.22–5.81)
RDW: 14.3 % (ref 11.5–15.5)
WBC: 12.6 10*3/uL — ABNORMAL HIGH (ref 4.0–10.5)

## 2014-06-01 LAB — BASIC METABOLIC PANEL
Anion gap: 6 (ref 5–15)
BUN: 23 mg/dL (ref 6–23)
CO2: 26 mmol/L (ref 19–32)
Calcium: 8 mg/dL — ABNORMAL LOW (ref 8.4–10.5)
Chloride: 109 mmol/L (ref 96–112)
Creatinine, Ser: 0.96 mg/dL (ref 0.50–1.35)
GFR calc Af Amer: >90 mL/min (ref >90)
GFR calc non Af Amer: 83 mL/min — ABNORMAL LOW (ref >90)
GLUCOSE: 103 mg/dL — AB (ref 70–99)
Potassium: 4 mmol/L (ref 3.5–5.1)
Sodium: 141 mmol/L (ref 135–145)

## 2014-06-01 LAB — PROTIME-INR
INR: 1.22 (ref 0.00–1.49)
Prothrombin Time: 15.6 seconds — ABNORMAL HIGH (ref 11.6–15.2)

## 2014-06-01 MED ORDER — PANTOPRAZOLE SODIUM 40 MG PO TBEC: 40.0000 mg | DELAYED_RELEASE_TABLET | Freq: Two times a day (BID) | ORAL | Status: DC

## 2014-06-01 NOTE — Discharge Summary (Signed)
Physician Discharge Summary  LEDGER HEINDL IOE:[id number] DOB: [birth year] DOA: 05/30/2014  PCP: Bronson Curb, PA-C  Admit date: 05/30/2014 Discharge date: 06/01/2014  Time spent: 45 minutes  Recommendations for Outpatient Follow-up:  -Will be discharged home today. -Advised to follow up with PCP in 1 week for CBC and coumadin re-initiation.   Discharge Diagnoses:  Principal Problem:   GI bleed Active Problems:   Acute blood loss anemia   HTN (hypertension)   High cholesterol   Atrial flutter   Coagulopathy   Discharge Condition: Stable and improved  Filed Weights   05/30/14 0822 05/30/14 1210 05/31/14 0500  Weight: 104.327 kg (230 lb) 105.8 kg (233 lb 4 oz) 106.1 kg (233 lb 14.5 oz)    History of present illness:  Patient is a 69 year old gentleman with history significant for cardiomyopathy, moderate aortic stenosis, mild mitral regurgitation, hypertension, atrial flutter on chronic anticoagulation with Coumadin who presents to the hospital today with the above-mentioned complaints. He and his wife stated that for the past 3-4 weeks he has been increasingly fatigued, getting extremely short of breath. This morning was unable to get out of bed and get to the hospital for evaluation. On further questioning he does note some really dark stools for the past 2 weeks. He states he went to visit his primary care physician and they gave him several courses of antibiotics for what was presumed to be bronchitis. In the hospital he is found to have a hemoglobin of 4.6 and coagulopathic with an INR of 6. He has been given FFP, we have been asked to admit him for further evaluation and management.  Hospital Course:   Upper GI Bleed -Secondary to duodenal Ulcers as seen on EGD. -Continue BID PPI. -Per Dr. Oneida Alar, may restart coumadin on April 1st.  Acute Blood Loss Anemia -2/2 GI Bleed. -Hb 4.3 on admission. -Up to 8.8 s/p 4 units of PRBCs.  Coagulopathy -Coumadin has  been reversed with vit K/FFP.  A Flutter -Rate controlled. -Coumadin on hold 2/2 GI Bleed. Can restart on 4/1 per GI recommendations.  HTN -Controlled. -Continue home medications  Procedures: EGD 3/25:  UGIB MOST LIKELY DUE TO DUODENAL ULCERS IN SETTING OF ANTICOAGULATION  2. MILD Non-erosive gastritis  Consultations:  GI, Dr. Oneida Alar  Discharge Instructions  Discharge Instructions    Diet - low sodium heart healthy    Complete by:  As directed      Increase activity slowly    Complete by:  As directed             Medication List    STOP taking these medications        levofloxacin 750 MG tablet  Commonly known as:  LEVAQUIN     predniSONE 20 MG tablet  Commonly known as:  DELTASONE     warfarin 4 MG tablet  Commonly known as:  COUMADIN     warfarin 5 MG tablet  Commonly known as:  COUMADIN      TAKE these medications        carvedilol 6.25 MG tablet  Commonly known as:  COREG  Take 1 tablet (6.25 mg total) by mouth 2 (two) times daily with a meal.     dextromethorphan-guaiFENesin 30-600 MG per 12 hr tablet  Commonly known as:  MUCINEX DM  Take 1 tablet by mouth 2 (two) times daily as needed for cough.     ferrous sulfate 325 (65 FE) MG tablet  Take 325 mg by mouth  daily with breakfast.     Fluticasone-Salmeterol 250-50 MCG/DOSE Aepb  Commonly known as:  ADVAIR DISKUS  Inhale 1 puff into the lungs as needed.     furosemide 40 MG tablet  Commonly known as:  LASIX  Take 40 mg by mouth daily.     gabapentin 300 MG capsule  Commonly known as:  NEURONTIN  Take 300 mg by mouth daily as needed (for pain/neuropathy).     lisinopril 20 MG tablet  Commonly known as:  PRINIVIL,ZESTRIL  Take 20 mg by mouth daily.     pantoprazole 40 MG tablet  Commonly known as:  PROTONIX  Take 1 tablet (40 mg total) by mouth 2 (two) times daily.     pravastatin 40 MG tablet  Commonly known as:  PRAVACHOL  Take 1 tablet by mouth every morning.     VENTOLIN HFA  108 (90 BASE) MCG/ACT inhaler  Generic drug:  albuterol  Inhale 1 puff into the lungs every 6 (six) hours as needed for wheezing or shortness of breath.       No Known Allergies     Follow-up Information    Follow up with [name], ANTHONY T, PA-C. Schedule an appointment as soon as possible for a visit in 5 days.   Specialty:  Physician Assistant   Contact information:   [address] [place] [address] [phone]        The results of significant diagnostics from this hospitalization (including imaging, microbiology, ancillary and laboratory) are listed below for reference.    Significant Diagnostic Studies: Dg Chest 2 View  05/13/2014   CLINICAL DATA:  Shortness of breath and productive cough for 1 week.  EXAM: CHEST  2 VIEW  COMPARISON:  None.  FINDINGS: The lungs are clear. Heart size is normal. There is no pneumothorax or pleural effusion. Mild thoracic degenerative change is noted.  IMPRESSION: No acute disease.   Electronically Signed   By: Inge Rise M.D.   On: 05/13/2014 18:53   Dg Chest Portable 1 View  05/30/2014   CLINICAL DATA:  Shortness of Breath  EXAM: PORTABLE CHEST - 1 VIEW  COMPARISON:  05/13/2014  FINDINGS: Cardiomediastinal silhouette is stable. No acute infiltrate or pleural effusion. No pulmonary edema. Stable blunting bilateral cardiophrenic angle. Mild degenerative changes thoracic spine.  IMPRESSION: No active disease.   Electronically Signed   By: Lahoma Crocker M.D.   On: 05/30/2014 09:19    Microbiology: Recent Results (from the past 240 hour(s))  MRSA PCR Screening     Status: None   Collection Time: 05/30/14 12:52 PM  Result Value Ref Range Status   MRSA by PCR NEGATIVE NEGATIVE Final    Comment:        The GeneXpert MRSA Assay (FDA approved for NASAL specimens only), is one component of a comprehensive MRSA colonization surveillance program. It is not intended to diagnose MRSA infection nor to guide or monitor treatment  for MRSA infections.      Labs: Basic Metabolic Panel:  Recent Labs Lab 05/30/14 0855 05/31/14 0539 06/01/14 0502  NA 137 141 141  K 4.4 4.7 4.0  CL 108 112 109  CO2 23 25 26   GLUCOSE 119* 109* 103*  BUN 93* 55* 23  CREATININE 1.80* 1.11 0.96  CALCIUM 7.9* 8.0* 8.0*   Liver Function Tests:  Recent Labs Lab 05/30/14 0855  AST 14  ALT 15  ALKPHOS 35*  BILITOT 0.4  PROT 5.0*  ALBUMIN 2.3*   No  results for input(s): LIPASE, AMYLASE in the last 168 hours. No results for input(s): AMMONIA in the last 168 hours. CBC:  Recent Labs Lab 05/30/14 0855 05/31/14 0539 05/31/14 1935 06/01/14 0502  WBC 15.8* 18.1* 15.9* 12.6*  HGB 4.3* 7.0* 8.3* 7.8*  HCT 13.1* 20.4* 23.7* 22.6*  MCV 90.3 88.3 89.4 90.0  PLT 246 179 189 171   Cardiac Enzymes: No results for input(s): CKTOTAL, CKMB, CKMBINDEX, TROPONINI in the last 168 hours. BNP: BNP (last 3 results) No results for input(s): BNP in the last 8760 hours.  ProBNP (last 3 results) No results for input(s): PROBNP in the last 8760 hours.  CBG: No results for input(s): GLUCAP in the last 168 hours.     SignedLelon Frohlich  [facility] Pager: [phone] 06/01/2014, 4:53 PM

## 2014-06-01 NOTE — Discharge Instructions (Signed)
Hold your coumadin for now and restart it on April 1st.

## 2014-06-01 NOTE — Progress Notes (Signed)
  Subjective:  Patient has no complaints. He states is 100% better. He denies chest pain shortness of breath or abdominal pain. He stated stool is still black but not loose anymore.  Objective: Blood pressure 116/48, pulse 101, temperature 98.5 F (36.9 C), temperature source Oral, resp. rate 20, height 5\' 6"  (1.676 m), weight 233 lb 14.5 oz (106.1 kg), SpO2 100 %. Patient is alert and in no acute distress. Abdomen is protuberant but soft and nontender without organomegaly or masses. No LE edema noted.  Labs/studies Results:   Recent Labs  05/31/14 0539 05/31/14 1935 06/01/14 0502  WBC 18.1* 15.9* 12.6*  HGB 7.0* 8.3* 7.8*  HCT 20.4* 23.7* 22.6*  PLT 179 189 171    BMET   Recent Labs  05/30/14 0855 05/31/14 0539 06/01/14 0502  NA 137 141 141  K 4.4 4.7 4.0  CL 108 112 109  CO2 23 25 26   GLUCOSE 119* 109* 103*  BUN 93* 55* 23  CREATININE 1.80* 1.11 0.96  CALCIUM 7.9* 8.0* 8.0*    LFT   Recent Labs  05/30/14 0855  PROT 5.0*  ALBUMIN 2.3*  AST 14  ALT 15  ALKPHOS 35*  BILITOT 0.4    PT/INR   Recent Labs  05/30/14 0855 06/01/14 0502  LABPROT 58.2* 15.6*  INR 6.61* 1.22     Assessment:  #1. Four unit GI bleed secondary to duodenal ulcers. Hemoglobin is still low at 7.8 g but much better than when he came in. Then it was 4.3 g. No evidence of recurrent bleed. #2. Anemia secondary to upper GI bleed. Unclear as to what his baseline H&H's. Hemoglobin 19 days ago was 11.1 g.  #3. Atrial fibrillation. Anticoagulation on hold until 06/07/2014 .  Recommendations ;  Agree with discharge planning. Dr. Oneida Alar are staff will contact patient with biopsy results. Resume warfarin on 06/07/2014. Continue pantoprazole at 40 mg by mouth twice a day for 8 weeks and thereafter once daily. Patient advised to have H&H within 1-2 weeks by PCP.

## 2014-06-01 NOTE — Progress Notes (Signed)
Patient discharged with instructions, prescription, and care notes.  Verbalized understanding via teach back.  IV was removed and the site was WNL. Patient voiced no further complaints or concerns at the time of discharge.  Appointments scheduled per instructions.  Patient left the floor via ambulation with staff and family in stable condition. 

## 2014-06-03 ENCOUNTER — Encounter (HOSPITAL_COMMUNITY): Payer: Self-pay | Admitting: Gastroenterology

## 2014-06-03 LAB — TYPE AND SCREEN
ABO/RH(D): O POS
Antibody Screen: NEGATIVE
UNIT DIVISION: 0
UNIT DIVISION: 0
UNIT DIVISION: 0
Unit division: 0
Unit division: 0
Unit division: 0

## 2014-06-06 ENCOUNTER — Encounter (HOSPITAL_COMMUNITY): Payer: Self-pay | Admitting: Gastroenterology

## 2014-06-07 ENCOUNTER — Encounter: Payer: Self-pay | Admitting: *Deleted

## 2014-06-07 ENCOUNTER — Encounter: Payer: Self-pay | Admitting: Gastroenterology

## 2014-06-10 ENCOUNTER — Ambulatory Visit (INDEPENDENT_AMBULATORY_CARE_PROVIDER_SITE_OTHER): Payer: Medicare PPO | Admitting: Adult Health

## 2014-06-10 ENCOUNTER — Encounter: Payer: Self-pay | Admitting: Adult Health

## 2014-06-10 VITALS — BP 104/58 | HR 88 | Ht 66.0 in | Wt 231.4 lb

## 2014-06-10 DIAGNOSIS — I483 Typical atrial flutter: Secondary | ICD-10-CM | POA: Diagnosis not present

## 2014-06-10 DIAGNOSIS — I420 Dilated cardiomyopathy: Secondary | ICD-10-CM | POA: Diagnosis not present

## 2014-06-10 DIAGNOSIS — I10 Essential (primary) hypertension: Secondary | ICD-10-CM | POA: Diagnosis not present

## 2014-06-10 MED ORDER — LISINOPRIL 10 MG PO TABS: 10.0000 mg | ORAL_TABLET | Freq: Every day | ORAL | Status: DC

## 2014-06-10 NOTE — Progress Notes (Signed)
Cardiology Office Note   Date:  06/10/2014   ID:  Shea, Ryan 12/02/1945, MRN [id number]  PCP:  Bronson Curb, PA-C  Cardiologist:  Woodroe Chen, NP   Chief Complaint  Patient presents with  . Cardiomyopathy  . Aortic Stenosis  . Atrial Flutter  . Hypertension      History of Present Illness: Ryan Shea is a 69 y.o. male who presents for ongoing assessment and management and outpatient followup after admission for GI bleed, with history of hypertension, atrial flutter, on anticoagulation therapy with Coumadin, moderate aortic valve stenosis, and mild mitral regurgitation, along with hypertension.  He is recently admitted on 320 14,016 had upper and GI bleed secondary to duodenal ulcers seen on EGD, continued on PPI and followed by Dr. Oneida Alar.  Coumadin was to restart on 06/07/2014.  Atrial flutter.  Rate was controlled, blood pressure was well controlled.    He have followup with his primary care provider on 03/31/2016Was feeling better concerning fatigue and weakness after blood transfusions during hospitalization.  He was started back on warfarin 3 mg daily with CHADS VASC Score of 3.   He comes in feeling much better, is slowly regaining his strength.  He is been seen by his primary care physician on March 31, hemoglobin was 9.0.  Her labs on that evaluation.  Coumadin dosing and INR checked through PCP.  He occasionally feels some dizziness, but otherwise is doing well.  Has not noticed any blood in his stool, urine, or has not experience epistaxis or hemoptysis.  He is tolerating his medications without problem.   Past Medical History  Diagnosis Date  . Asthma   . Hypertension   . COPD (chronic obstructive pulmonary disease)   . Cancer     Prostate     Past Surgical History  Procedure Laterality Date  . Prostate surgery    . Hernia repair    . Esophagogastroduodenoscopy N/A 05/31/2014    Procedure: ESOPHAGOGASTRODUODENOSCOPY (EGD);   Surgeon: Danie Binder, MD;  Location: [facility];  Service: Endoscopy;  Laterality: N/A;  10:00 am per Dr. Oneida Alar     Current Outpatient Prescriptions  Medication Sig Dispense Refill  . carvedilol (COREG) 6.25 MG tablet Take 1 tablet (6.25 mg total) by mouth 2 (two) times daily with a meal. 180 tablet 3  . dextromethorphan-guaiFENesin (MUCINEX DM) 30-600 MG per 12 hr tablet Take 1 tablet by mouth 2 (two) times daily as needed for cough. 20 tablet 0  . ferrous sulfate 325 (65 FE) MG tablet Take 325 mg by mouth daily with breakfast.    . Fluticasone-Salmeterol (ADVAIR DISKUS) 250-50 MCG/DOSE AEPB Inhale 1 puff into the lungs as needed. (Patient taking differently: Inhale 1 puff into the lungs as needed (for shortness of breath). ) 60 each 1  . furosemide (LASIX) 40 MG tablet Take 40 mg by mouth daily.     Marland Kitchen gabapentin (NEURONTIN) 300 MG capsule Take 300 mg by mouth daily as needed (for pain/neuropathy).     Marland Kitchen lisinopril (PRINIVIL,ZESTRIL) 20 MG tablet Take 20 mg by mouth daily.    . pantoprazole (PROTONIX) 40 MG tablet Take 1 tablet (40 mg total) by mouth 2 (two) times daily. 60 tablet 2  . pravastatin (PRAVACHOL) 40 MG tablet Take 1 tablet by mouth every morning.     . VENTOLIN HFA 108 (90 BASE) MCG/ACT inhaler Inhale 1 puff into the lungs every 6 (six) hours as needed for wheezing or shortness of breath.  No current facility-administered medications for this visit.    Allergies:   Review of patient's allergies indicates no known allergies.    Social History:  The patient  reports that he quit smoking about 35 years ago. His smoking use included Cigarettes. He started smoking about 54 years ago. He has a 40 pack-year smoking history. He has never used smokeless tobacco. He reports that he does not drink alcohol or use illicit drugs.   Family History:  The patient's family history is not on file.    ROS: .   All other systems are reviewed and negative.Unless otherwise mentioned in  H&P above.   PHYSICAL EXAM: VS:  There were no vitals taken for this visit. , BMI There is no weight on file to calculate BMI. GEN: Well nourished, well developed, in no acute distress HEENT: normal Neck: no JVD, carotid bruits, or masses Cardiac: RRR; 1/6 systolic murmur, no murmurs, rubs, or gallops,no edema  Respiratory:  Clear to auscultation bilaterally,with mild wheezing on inspiration, this is intermittent normal work of breathing GI: soft, nontender, nondistended, + BS MS: no deformity or atrophy Skin: warm and dry, no rash Neuro:  Strength and sensation are intact Psych: euthymic mood, full affect   Recent Labs: 05/30/2014: ALT 15 06/01/2014: BUN 23; Creatinine 0.96; Hemoglobin 7.8*; Platelets 171; Potassium 4.0; Sodium 141    Lipid Panel No results found for: CHOL, TRIG, HDL, CHOLHDL, VLDL, LDLCALC, LDLDIRECT    Wt Readings from Last 3 Encounters:  05/31/14 233 lb 14.5 oz (106.1 kg)  05/13/14 239 lb (108.41 kg)  08/16/13 232 lb (105.235 kg)      Other studies Reviewed: Additional studies/ records that were reviewed today include: improvement in hemoglobin and hematocrit from labs drawn by primary care physician on 06/06/2014, with hemoglobin increased to 9.0. Review of the above records demonstrates: improvement.   ASSESSMENT AND PLAN:  1. Hypotension: He is on lisinopril 20 mg daily.  I will decrease this to 10 mg daily to avoid dizziness and continued hypotension.  He states he feels his best when his blood pressures were 130/70.  Today it is 104/58.  He has lost 8 pounds, is recovering from pneumonia, and the GI bleeding.  He is back on Coumadin.  I am worried about him becoming dizzy or near syncopal and falling and injuring himself.  He will decrease the dose of his ACE inhibitor and continue followup appointment with primary care, which is scheduled for tomorrow.  We will see him again in 3 months.  Primary care physician is due to have blood drawn tomorrow.  We  are requesting a copy  2. Cardiomyopathy:most recent echocardiogram revealed an EF of 50-55%.  There is no evidence of ischemia on recent LexiScan Cardiolite.  There is no evidence of acute CHF  3. Atrial flutter:heart rate is well controlled.  He will continue on anticoagulation with Coumadin, dosing per primary care.  No evidence of bleeding.  4. Hypercholesterolemia:Labs are completed by her primary care.  We are requesting labs, which will be completed tomorrow. Current medicines are reviewed at length with the patient today.    Labs/ tests ordered today include: None  No orders of the defined types were placed in this encounter.     Disposition:   FU with 3 months Signed, Jory Sims, NP  06/10/2014 10:54 AM    [facility] [address], [place], [place] [address] Phone: [phone]; Fax: [phone]

## 2014-06-10 NOTE — Patient Instructions (Signed)
Your physician recommends that you schedule a follow-up appointment in: 3 months with Jory Sims, NP.  Your physician has recommended you make the following change in your medication:   Decrease Lisinopril to 10 mg Daily  Thank you for choosing [facility]!

## 2014-06-10 NOTE — Progress Notes (Deleted)
Name: Ryan Shea    DOB: June 27, 1945  Age: 69 y.o.  MR#: [id number]       PCP:  Bronson Curb, PA-C      Insurance: Payor: Mcarthur Rossetti MEDICARE / Plan: [employer] MEDICARE CHOICE PPO / Product Type: *No Product type* /   CC:    Chief Complaint  Patient presents with  . Cardiomyopathy  . Aortic Stenosis  . Atrial Flutter  . Hypertension    VS Filed Vitals:   06/10/14 1409  BP: 104/58  Pulse: 88  Height: 5\' 6"  (1.676 m)  Weight: 231 lb 6.4 oz (104.962 kg)  SpO2: 94%    Weights Current Weight  06/10/14 231 lb 6.4 oz (104.962 kg)  05/31/14 233 lb 14.5 oz (106.1 kg)  05/13/14 239 lb (108.41 kg)    Blood Pressure  BP Readings from Last 3 Encounters:  06/10/14 104/58  06/01/14 116/48  05/14/14 116/48     Admit date:  (Not on file) Last encounter with RMR:  Visit date not found   Allergy Review of patient's allergies indicates no known allergies.  Current Outpatient Prescriptions  Medication Sig Dispense Refill  . carvedilol (COREG) 6.25 MG tablet Take 1 tablet (6.25 mg total) by mouth 2 (two) times daily with a meal. 180 tablet 3  . dextromethorphan-guaiFENesin (MUCINEX DM) 30-600 MG per 12 hr tablet Take 1 tablet by mouth 2 (two) times daily as needed for cough. 20 tablet 0  . ferrous sulfate 325 (65 FE) MG tablet Take 325 mg by mouth daily with breakfast.    . Fluticasone-Salmeterol (ADVAIR DISKUS) 250-50 MCG/DOSE AEPB Inhale 1 puff into the lungs as needed. (Patient taking differently: Inhale 1 puff into the lungs as needed (for shortness of breath). ) 60 each 1  . furosemide (LASIX) 40 MG tablet Take 40 mg by mouth daily.     Marland Kitchen gabapentin (NEURONTIN) 300 MG capsule Take 300 mg by mouth daily as needed (for pain/neuropathy).     Marland Kitchen lisinopril (PRINIVIL,ZESTRIL) 20 MG tablet Take 20 mg by mouth daily.    . pantoprazole (PROTONIX) 40 MG tablet Take 1 tablet (40 mg total) by mouth 2 (two) times daily. 60 tablet 2  . pravastatin (PRAVACHOL) 40 MG tablet Take 1 tablet by  mouth every morning.     . VENTOLIN HFA 108 (90 BASE) MCG/ACT inhaler Inhale 1 puff into the lungs every 6 (six) hours as needed for wheezing or shortness of breath.     . warfarin (COUMADIN) 3 MG tablet      No current facility-administered medications for this visit.    Discontinued Meds:   There are no discontinued medications.  Patient Active Problem List   Diagnosis Date Noted  . GI bleed 05/30/2014  . Acute blood loss anemia 05/30/2014  . Coagulopathy 05/30/2014  . Melena   . Aortic stenosis 04/23/2013  . Mitral regurgitation 04/23/2013  . Cardiomyopathy 04/23/2013  . Atrial flutter 04/23/2013  . HTN (hypertension) 04/20/2013  . High cholesterol 04/20/2013  . COPD (chronic obstructive pulmonary disease) 04/20/2013  . Prostate cancer 04/20/2013  . Backache, unspecified 04/20/2013  . Body mass index 40.0-44.9, adult 04/20/2013    LABS    Component Value Date/Time   NA 141 06/01/2014 0502   NA 141 05/31/2014 0539   NA 137 05/30/2014 0855   K 4.0 06/01/2014 0502   K 4.7 05/31/2014 0539   K 4.4 05/30/2014 0855   CL 109 06/01/2014 0502   CL 112 05/31/2014 0539  CL 108 05/30/2014 0855   CO2 26 06/01/2014 0502   CO2 25 05/31/2014 0539   CO2 23 05/30/2014 0855   GLUCOSE 103* 06/01/2014 0502   GLUCOSE 109* 05/31/2014 0539   GLUCOSE 119* 05/30/2014 0855   BUN 23 06/01/2014 0502   BUN 55* 05/31/2014 0539   BUN 93* 05/30/2014 0855   CREATININE 0.96 06/01/2014 0502   CREATININE 1.11 05/31/2014 0539   CREATININE 1.80* 05/30/2014 0855   CALCIUM 8.0* 06/01/2014 0502   CALCIUM 8.0* 05/31/2014 0539   CALCIUM 7.9* 05/30/2014 0855   GFRNONAA 83* 06/01/2014 0502   GFRNONAA 66* 05/31/2014 0539   GFRNONAA 37* 05/30/2014 0855   GFRAA >90 06/01/2014 0502   GFRAA 76* 05/31/2014 0539   GFRAA 43* 05/30/2014 0855   CMP     Component Value Date/Time   NA 141 06/01/2014 0502   K 4.0 06/01/2014 0502   CL 109 06/01/2014 0502   CO2 26 06/01/2014 0502   GLUCOSE 103* 06/01/2014  0502   BUN 23 06/01/2014 0502   CREATININE 0.96 06/01/2014 0502   CALCIUM 8.0* 06/01/2014 0502   PROT 5.0* 05/30/2014 0855   ALBUMIN 2.3* 05/30/2014 0855   AST 14 05/30/2014 0855   ALT 15 05/30/2014 0855   ALKPHOS 35* 05/30/2014 0855   BILITOT 0.4 05/30/2014 0855   GFRNONAA 83* 06/01/2014 0502   GFRAA >90 06/01/2014 0502       Component Value Date/Time   WBC 12.6* 06/01/2014 0502   WBC 15.9* 05/31/2014 1935   WBC 18.1* 05/31/2014 0539   HGB 7.8* 06/01/2014 0502   HGB 8.3* 05/31/2014 1935   HGB 7.0* 05/31/2014 0539   HCT 22.6* 06/01/2014 0502   HCT 23.7* 05/31/2014 1935   HCT 20.4* 05/31/2014 0539   MCV 90.0 06/01/2014 0502   MCV 89.4 05/31/2014 1935   MCV 88.3 05/31/2014 0539    Lipid Panel  No results found for: CHOL, TRIG, HDL, CHOLHDL, VLDL, LDLCALC, LDLDIRECT  ABG No results found for: PHART, PCO2ART, PO2ART, HCO3, TCO2, ACIDBASEDEF, O2SAT   No results found for: TSH BNP (last 3 results) No results for input(s): BNP in the last 8760 hours.  ProBNP (last 3 results) No results for input(s): PROBNP in the last 8760 hours.  Cardiac Panel (last 3 results) No results for input(s): CKTOTAL, CKMB, TROPONINI, RELINDX in the last 72 hours.  Iron/TIBC/Ferritin/ %Sat No results found for: IRON, TIBC, FERRITIN, IRONPCTSAT   EKG Orders placed or performed during the hospital encounter of 05/30/14  . EKG 12-Lead  . EKG 12-Lead  . EKG     Prior Assessment and Plan Problem List as of 06/10/2014      Cardiovascular and Mediastinum   HTN (hypertension)   Aortic stenosis   Mitral regurgitation   Cardiomyopathy   Atrial flutter     Respiratory   COPD (chronic obstructive pulmonary disease)     Digestive   GI bleed   Melena     Genitourinary   Prostate cancer     Hematopoietic and Hemostatic   Coagulopathy     Other   High cholesterol   Backache, unspecified   Body mass index 40.0-44.9, adult   Acute blood loss anemia       Imaging: Dg Chest 2  View  05/13/2014   CLINICAL DATA:  Shortness of breath and productive cough for 1 week.  EXAM: CHEST  2 VIEW  COMPARISON:  None.  FINDINGS: The lungs are clear. Heart size is normal. There is no pneumothorax or  pleural effusion. Mild thoracic degenerative change is noted.  IMPRESSION: No acute disease.   Electronically Signed   By: Inge Rise M.D.   On: 05/13/2014 18:53   Dg Chest Portable 1 View  05/30/2014   CLINICAL DATA:  Shortness of Breath  EXAM: PORTABLE CHEST - 1 VIEW  COMPARISON:  05/13/2014  FINDINGS: Cardiomediastinal silhouette is stable. No acute infiltrate or pleural effusion. No pulmonary edema. Stable blunting bilateral cardiophrenic angle. Mild degenerative changes thoracic spine.  IMPRESSION: No active disease.   Electronically Signed   By: Lahoma Crocker M.D.   On: 05/30/2014 09:19

## 2014-06-12 ENCOUNTER — Encounter (HOSPITAL_COMMUNITY): Payer: Self-pay | Admitting: Emergency Medicine

## 2014-06-12 ENCOUNTER — Emergency Department (HOSPITAL_COMMUNITY)
Admission: EM | Admit: 2014-06-12 | Discharge: 2014-06-12 | Disposition: A | Discharge status: Home / Self Care | Payer: Medicare PPO | Attending: Emergency Medicine | Admitting: Emergency Medicine

## 2014-06-12 DIAGNOSIS — Z79899 Other long term (current) drug therapy: Secondary | ICD-10-CM | POA: Insufficient documentation

## 2014-06-12 DIAGNOSIS — N179 Acute kidney failure, unspecified: Secondary | ICD-10-CM | POA: Diagnosis not present

## 2014-06-12 DIAGNOSIS — K625 Hemorrhage of anus and rectum: Secondary | ICD-10-CM | POA: Diagnosis present

## 2014-06-12 DIAGNOSIS — I10 Essential (primary) hypertension: Secondary | ICD-10-CM | POA: Insufficient documentation

## 2014-06-12 DIAGNOSIS — Z87891 Personal history of nicotine dependence: Secondary | ICD-10-CM | POA: Diagnosis not present

## 2014-06-12 DIAGNOSIS — J449 Chronic obstructive pulmonary disease, unspecified: Secondary | ICD-10-CM | POA: Diagnosis not present

## 2014-06-12 DIAGNOSIS — D649 Anemia, unspecified: Secondary | ICD-10-CM | POA: Insufficient documentation

## 2014-06-12 DIAGNOSIS — Z7901 Long term (current) use of anticoagulants: Secondary | ICD-10-CM | POA: Insufficient documentation

## 2014-06-12 DIAGNOSIS — Z8546 Personal history of malignant neoplasm of prostate: Secondary | ICD-10-CM | POA: Insufficient documentation

## 2014-06-12 DIAGNOSIS — D6489 Other specified anemias: Secondary | ICD-10-CM

## 2014-06-12 LAB — CBC WITH DIFFERENTIAL/PLATELET
BASOS ABS: 0 10*3/uL (ref 0.0–0.1)
Basophils Relative: 1 % (ref 0–1)
Eosinophils Absolute: 0.1 10*3/uL (ref 0.0–0.7)
Eosinophils Relative: 1 % (ref 0–5)
HCT: 28.6 % — ABNORMAL LOW (ref 39.0–52.0)
Hemoglobin: 9.3 g/dL — ABNORMAL LOW (ref 13.0–17.0)
Lymphocytes Relative: 21 % (ref 12–46)
Lymphs Abs: 1.6 10*3/uL (ref 0.7–4.0)
MCH: 30.6 pg (ref 26.0–34.0)
MCHC: 32.5 g/dL (ref 30.0–36.0)
MCV: 94.1 fL (ref 78.0–100.0)
Monocytes Absolute: 0.7 10*3/uL (ref 0.1–1.0)
Monocytes Relative: 9 % (ref 3–12)
NEUTROS ABS: 5.2 10*3/uL (ref 1.7–7.7)
NEUTROS PCT: 68 % (ref 43–77)
Platelets: 408 10*3/uL — ABNORMAL HIGH (ref 150–400)
RBC: 3.04 MIL/uL — ABNORMAL LOW (ref 4.22–5.81)
RDW: 14.1 % (ref 11.5–15.5)
WBC: 7.6 10*3/uL (ref 4.0–10.5)

## 2014-06-12 LAB — COMPREHENSIVE METABOLIC PANEL
ALK PHOS: 61 U/L (ref 39–117)
ALT: 13 U/L (ref 0–53)
ANION GAP: 7 (ref 5–15)
AST: 16 U/L (ref 0–37)
Albumin: 3.2 g/dL — ABNORMAL LOW (ref 3.5–5.2)
BUN: 36 mg/dL — AB (ref 6–23)
CHLORIDE: 103 mmol/L (ref 96–112)
CO2: 28 mmol/L (ref 19–32)
Calcium: 8.9 mg/dL (ref 8.4–10.5)
Creatinine, Ser: 1.83 mg/dL — ABNORMAL HIGH (ref 0.50–1.35)
GFR calc non Af Amer: 36 mL/min — ABNORMAL LOW (ref >90)
GFR, EST AFRICAN AMERICAN: 42 mL/min — AB (ref >90)
GLUCOSE: 129 mg/dL — AB (ref 70–99)
POTASSIUM: 4.3 mmol/L (ref 3.5–5.1)
SODIUM: 138 mmol/L (ref 135–145)
Total Bilirubin: 0.4 mg/dL (ref 0.3–1.2)
Total Protein: 6.6 g/dL (ref 6.0–8.3)

## 2014-06-12 LAB — PROTIME-INR
INR: 1.11 (ref 0.00–1.49)
Prothrombin Time: 14.4 seconds (ref 11.6–15.2)

## 2014-06-12 LAB — POC OCCULT BLOOD, ED: Fecal Occult Bld: NEGATIVE

## 2014-06-12 MED ORDER — SODIUM CHLORIDE 0.9 % IV BOLUS (SEPSIS)
1000.0000 mL | Freq: Once | INTRAVENOUS | Status: AC
  Administered 2014-06-12: 1000 mL via INTRAVENOUS

## 2014-06-12 NOTE — ED Provider Notes (Signed)
TIME SEEN: 3:35 PM  CHIEF COMPLAINT: Dark stools  HPI: Pt is a 69 y.o. male with history of asthma, hypertension, atrial flutter on Coumadin who was recently admitted on 05/30/14 for GI bleed with melena. EGD showed duodenal ulcers and gastritis. Patient had to be transfused 4 units of packed red blood cells during that admission. Reports bleeding stopped and he was instructed to restart Coumadin on April 1. He is on iron tablets and states his stool have always been dark but thought that it looked a little darker than normal today. Denies any chest pain, shortness breath, lightheadedness. No abdominal pain. No fever. No vomiting. No bright red blood per rectum.  ROS: See HPI Constitutional: no fever  Eyes: no drainage  ENT: no runny nose   Cardiovascular:  no chest pain  Resp: no SOB  GI: no vomiting GU: no dysuria Integumentary: no rash  Allergy: no hives  Musculoskeletal: no leg swelling  Neurological: no slurred speech ROS otherwise negative  PAST MEDICAL HISTORY/PAST SURGICAL HISTORY:  Past Medical History  Diagnosis Date  . Asthma   . Hypertension   . COPD (chronic obstructive pulmonary disease)   . Cancer     Prostate     MEDICATIONS:  Prior to Admission medications   Medication Sig Start Date End Date Taking? Authorizing Provider  warfarin (COUMADIN) 3 MG tablet  06/07/14  Yes Historical Provider, MD  carvedilol (COREG) 6.25 MG tablet Take 1 tablet (6.25 mg total) by mouth 2 (two) times daily with a meal. 05/23/13   [name], MD  dextromethorphan-guaiFENesin [facility] DM) 30-600 MG per 12 hr tablet Take 1 tablet by mouth 2 (two) times daily as needed for cough. 05/14/14   Rolland Porter, MD  ferrous sulfate 325 (65 FE) MG tablet Take 325 mg by mouth daily with breakfast.    Historical Provider, MD  Fluticasone-Salmeterol (ADVAIR DISKUS) 250-50 MCG/DOSE AEPB Inhale 1 puff into the lungs as needed. Patient taking differently: Inhale 1 puff into the lungs as needed (for  shortness of breath).  04/23/13   [name], MD  furosemide (LASIX) 40 MG tablet Take 40 mg by mouth daily.  02/13/14   Historical Provider, MD  gabapentin (NEURONTIN) 300 MG capsule Take 300 mg by mouth daily as needed (for pain/neuropathy).  02/23/14   Historical Provider, MD  lisinopril (PRINIVIL,ZESTRIL) 10 MG tablet Take 1 tablet (10 mg total) by mouth daily. 06/10/14   Lendon Colonel, NP  pantoprazole (PROTONIX) 40 MG tablet Take 1 tablet (40 mg total) by mouth 2 (two) times daily. 06/01/14   Erline Hau, MD  pravastatin (PRAVACHOL) 40 MG tablet Take 1 tablet by mouth every morning.  07/06/13   Historical Provider, MD  VENTOLIN HFA 108 (90 BASE) MCG/ACT inhaler Inhale 1 puff into the lungs every 6 (six) hours as needed for wheezing or shortness of breath.  05/10/14   Historical Provider, MD    ALLERGIES:  No Known Allergies  SOCIAL HISTORY:  History  Substance Use Topics  . Smoking status: Former Smoker -- 2.00 packs/day for 20 years    Types: Cigarettes    Start date: 09/01/1959    Quit date: 09/01/1978  . Smokeless tobacco: Never Used     Comment: quit 30 years ago  . Alcohol Use: No    FAMILY HISTORY: Family History  Problem Relation Age of Onset  . Stroke Mother   . Cancer Brother   . Cancer Other     EXAM: BP  159/53 mmHg  Pulse 93  Temp(Src) 98.1 F (36.7 C) (Oral)  Resp 18  Ht 5\' 6"  (1.676 m)  Wt 231 lb (104.781 kg)  BMI 37.30 kg/m2  SpO2 97% CONSTITUTIONAL: Alert and oriented and responds appropriately to questions. Well-appearing; well-nourished, nontoxic, laughing, jovial HEAD: Normocephalic EYES: Conjunctivae clear, PERRL ENT: normal nose; no rhinorrhea; moist mucous membranes; pharynx without lesions noted NECK: Supple, no meningismus, no LAD  CARD: RRR; S1 and S2 appreciated; no murmurs, no clicks, no rubs, no gallops RESP: Normal chest excursion without splinting or tachypnea; breath sounds clear and equal bilaterally; no wheezes,  no rhonchi, no rales,  ABD/GI: Normal bowel sounds; non-distended; soft, non-tender, no rebound, no guarding RECTAL:  Patient has green/gray appearing stool but does not appear black, tarry. No bright red blood per rectum. Normal rectal tone. He is guaiac negative BACK:  The back appears normal and is non-tender to palpation, there is no CVA tenderness EXT: Normal ROM in all joints; non-tender to palpation; no edema; normal capillary refill; no cyanosis    SKIN: Normal color for age and race; warm NEURO: Moves all extremities equally PSYCH: The patient's mood and manner are appropriate. Grooming and personal hygiene are appropriate.  MEDICAL DECISION MAKING: Patient here with darker stools than normal which may be secondary to his iron tablets. He has restarted his Coumadin. He is fecal occult blood negative. Hemoglobin is 9.3 which has improved significantly from discharge. His INR is 1.11. Abdominal exam is benign. He does have mild acute renal failure with creatinine of 1.83. We'll give IV fluids and have him follow-up with his primary care physician for this.  Discussed return precautions including abdominal pain, changes in his stool including bright red blood or dark, black stools that are different from his baseline that he should return to the hospital. He verbalizes understanding and is comfortable with plan.    [place], DO 06/12/14 [phone]

## 2014-06-12 NOTE — Discharge Instructions (Signed)
Your stools were likely dark today because of your iron tablets. There was no blood found in your stool. Your hemoglobin has improved from discharge and was 9.3. Please continue your iron tablets. You were found to have mild acute renal failure with a creatinine of 1.83. This may be secondary to dehydration. You have received IV fluids. This is something that you should have checked by your primary care provider in the next week. Your Coumadin level was also low with INR 1.11.   Acute Kidney Injury Acute kidney injury is a disease in which there is sudden (acute) damage to the kidneys. The kidneys are 2 organs that lie on either side of the spine between the middle of the back and the front of the abdomen. The kidneys:  Remove wastes and extra water from the blood.   Produce important hormones. These help keep bones strong, regulate blood pressure, and help create red blood cells.   Balance the fluids and chemicals in the blood and tissues. A small amount of kidney damage may not cause problems, but a large amount of damage may make it difficult or impossible for the kidneys to work the way they should. Acute kidney injury may develop into long-lasting (chronic) kidney disease. It may also develop into a life-threatening disease called end-stage kidney disease. Acute kidney injury can get worse very quickly, so it should be treated right away. Early treatment may prevent other kidney diseases from developing.  CAUSES   A problem with blood flow to the kidneys. This may be caused by:   Blood loss.   Heart disease.   Severe burns.   Liver disease.  Direct damage to the kidneys. This may be caused by:  Some medicines.   A kidney infection.   Poisoning or consuming toxic substances.   A surgical wound.   A blow to the kidney area.   A problem with urine flow. This may be caused by:   Cancer.   Kidney stones.   An enlarged prostate. SYMPTOMS   Swelling (edema)  of the legs, ankles, or feet.   Tiredness (lethargy).   Nausea or vomiting.   Confusion.   Problems with urination, such as:   Painful or burning feeling during urination.   Decreased urine production.   Frequent accidents in children who are potty trained.   Bloody urine.   Muscle twitches and cramps.   Shortness of breath.   Seizures.   Chest pain or pressure. Sometimes, no symptoms are present. DIAGNOSIS Acute kidney injury may be detected and diagnosed by tests, including blood, urine, imaging, or kidney biopsy tests.  TREATMENT Treatment of acute kidney injury varies depending on the cause and severity of the kidney damage. In mild cases, no treatment may be needed. The kidneys may heal on their own. If acute kidney injury is more severe, your caregiver will treat the cause of the kidney damage, help the kidneys heal, and prevent complications from occurring. Severe cases may require a procedure to remove toxic wastes from the body (dialysis) or surgery to repair kidney damage. Surgery may involve:   Repair of a torn kidney.   Removal of an obstruction. Most of the time, you will need to stay overnight at the hospital.  HOME CARE INSTRUCTIONS:  Follow your prescribed diet.  Only take over-the-counter or prescription medicines as directed by your caregiver.  Do not take any new medicines (prescription, over-the-counter, or nutritional supplements) unless approved by your caregiver. Many medicines can worsen your kidney damage  or need to have the dose adjusted.   Keep all follow-up appointments as directed by your caregiver.  Observe your condition to make sure you are healing as expected. SEEK IMMEDIATE MEDICAL CARE IF:  You are feeling ill or have severe pain in the back or side.   Your symptoms return or you have new symptoms.  You have any symptoms of end-stage kidney disease. These include:   Persistent itchiness.   Loss of appetite.    Headaches.   Abnormally dark or light skin.  Numbness in the hands or feet.   Easy bruising.   Frequent hiccups.   Menstruation stops.   You have a fever.  You have increased urine production.  You have pain or bleeding when urinating. MAKE SURE YOU:   Understand these instructions.  Will watch your condition.  Will get help right away if you are not doing well or get worse Document Released: 09/07/2010 Document Revised: 06/19/2012 Document Reviewed: 10/22/2011 [facility] Patient Information 2015 [place], [place]. This information is not intended to replace advice given to you by your health care provider. Make sure you discuss any questions you have with your health care provider.  Anemia, Nonspecific Anemia is a condition in which the concentration of red blood cells or hemoglobin in the blood is below normal. Hemoglobin is a substance in red blood cells that carries oxygen to the tissues of the body. Anemia results in not enough oxygen reaching these tissues.  CAUSES  Common causes of anemia include:   Excessive bleeding. Bleeding may be internal or external. This includes excessive bleeding from periods (in women) or from the intestine.   Poor nutrition.   Chronic kidney, thyroid, and liver disease.  Bone marrow disorders that decrease red blood cell production.  Cancer and treatments for cancer.  HIV, AIDS, and their treatments.  Spleen problems that increase red blood cell destruction.  Blood disorders.  Excess destruction of red blood cells due to infection, medicines, and autoimmune disorders. SIGNS AND SYMPTOMS   Minor weakness.   Dizziness.   Headache.  Palpitations.   Shortness of breath, especially with exercise.   Paleness.  Cold sensitivity.  Indigestion.  Nausea.  Difficulty sleeping.  Difficulty concentrating. Symptoms may occur suddenly or they may develop slowly.  DIAGNOSIS  Additional blood tests are often  needed. These help your health care provider determine the best treatment. Your health care provider will check your stool for blood and look for other causes of blood loss.  TREATMENT  Treatment varies depending on the cause of the anemia. Treatment can include:   Supplements of iron, vitamin Q00, or folic acid.   Hormone medicines.   A blood transfusion. This may be needed if blood loss is severe.   Hospitalization. This may be needed if there is significant continual blood loss.   Dietary changes.  Spleen removal. HOME CARE INSTRUCTIONS Keep all follow-up appointments. It often takes many weeks to correct anemia, and having your health care provider check on your condition and your response to treatment is very important. SEEK IMMEDIATE MEDICAL CARE IF:   You develop extreme weakness, shortness of breath, or chest pain.   You become dizzy or have trouble concentrating.  You develop heavy vaginal bleeding.   You develop a rash.   You have bloody or black, tarry stools.   You faint.   You vomit up blood.   You vomit repeatedly.   You have abdominal pain.  You have a fever or persistent symptoms for  more than 2-3 days.   You have a fever and your symptoms suddenly get worse.   You are dehydrated.  MAKE SURE YOU:  Understand these instructions.  Will watch your condition.  Will get help right away if you are not doing well or get worse. Document Released: 04/01/2004 Document Revised: 10/25/2012 Document Reviewed: 08/18/2012 [facility] Patient Information 2015 [place], [place]. This information is not intended to replace advice given to you by your health care provider. Make sure you discuss any questions you have with your health care provider.

## 2014-06-12 NOTE — ED Notes (Signed)
Pt reports he was seen here 2 days ago for rectal bleeding, treated and released. Pt reports today he noticed black tarry stools again.

## 2014-07-11 ENCOUNTER — Encounter: Payer: Self-pay | Admitting: Gastroenterology

## 2014-07-11 ENCOUNTER — Ambulatory Visit (INDEPENDENT_AMBULATORY_CARE_PROVIDER_SITE_OTHER): Payer: Medicare PPO | Admitting: Gastroenterology

## 2014-07-11 VITALS — BP 105/54 | HR 83 | Temp 97.6°F | Ht 66.0 in | Wt 233.2 lb

## 2014-07-11 DIAGNOSIS — Z1211 Encounter for screening for malignant neoplasm of colon: Secondary | ICD-10-CM

## 2014-07-11 DIAGNOSIS — K269 Duodenal ulcer, unspecified as acute or chronic, without hemorrhage or perforation: Secondary | ICD-10-CM

## 2014-07-11 DIAGNOSIS — K2981 Duodenitis with bleeding: Secondary | ICD-10-CM

## 2014-07-11 MED ORDER — AMOXICILLIN 500 MG PO TABS: ORAL_TABLET | ORAL | Status: DC

## 2014-07-11 MED ORDER — PANTOPRAZOLE SODIUM 40 MG PO TBEC: DELAYED_RELEASE_TABLET | ORAL | Status: DC

## 2014-07-11 NOTE — Assessment & Plan Note (Addendum)
UGIB due to INR > 6 and duodenal ulcers/H PYLORI GASTRITIS. NO BRBPR OR MELENA. LAST Hb 9.11 Jun 2014.  TREAT H PYLORI ABP BID FOR 10 DAYS. MED SIDE EFFECTS DISCUSSED. NEEDS CLOSE MONITORING OF COUMADIN WHILE ON ABX. REDUCE COUMADIN. CHECK INR 3 DAYS AFTER STARTING ABX. DISCUSSED WITH PHARMACY. PROTONIX BID FOR 3 MOS THEN ONCE DAILY FOR 3 MOS THEN WILL CONSIDER STOPPING MEDS. FOLLOW UP IN 4 MOS.  READDRESS NEED FOR PROTONIX. NEEDS CBC AFTER NEXT VISIT.

## 2014-07-11 NOTE — Progress Notes (Signed)
ON RECALL LIST  °

## 2014-07-11 NOTE — Progress Notes (Signed)
Subjective:    Patient ID: Ryan Shea, male    DOB: 01-19-1946, 69 y.o.   MRN: [id number]  [name], ANTHONY T, PA-C  HPI LAST SEEN MAR 2016-EGD FOR UGIB DUE TO PUD. PATH:POS FOR H PYLORI. BMs: 1-2X/DAY-NL. ALWAYS SOB: NO CHANGE.  PT DENIES FEVER, CHILLS, HEMATOCHEZIA, nausea, vomiting, melena, diarrhea, CHEST PAIN, CHANGE IN BOWEL IN HABITS, constipation, abdominal pain, problems swallowing, problems with sedation, OR heartburn or indigestion.   Past Medical History  Diagnosis Date  . Asthma   . Hypertension   . COPD (chronic obstructive pulmonary disease)   . Cancer     Prostate    Past Surgical History  Procedure Laterality Date  . Prostate surgery    . Hernia repair    . Esophagogastroduodenoscopy N/A 05/31/2014    Procedure: ESOPHAGOGASTRODUODENOSCOPY (EGD);  Surgeon: Danie Binder, MD;  Location: [facility];  Service: Endoscopy;  Laterality: N/A;  10:00 am per Dr. Oneida Alar   No Known Allergies  Current Outpatient Prescriptions  Medication Sig Dispense Refill  . carvedilol (COREG) 6.25 MG tablet Take 1 tablet (6.25 mg total) by mouth 2 (two) times daily with a meal.    . dextromethorphan-guaiFENesin (MUCINEX DM) 30-600 MG per 12 hr tablet Take 1 tablet by mouth 2 (two) times daily as needed for cough.    . ferrous sulfate 325 (65 FE) MG tablet Take 325 mg by mouth daily with breakfast.    . Fluticasone-Salmeterol (ADVAIR DISKUS) 250-50 MCG/DOSE AEPB Inhale 1 puff into the lungs as needed. (Patient taking differently: Inhale 1 puff into the lungs as needed (for shortness of breath). )    . furosemide (LASIX) 40 MG tablet Take 40 mg by mouth daily.     Marland Kitchen gabapentin (NEURONTIN) 300 MG capsule Take 300 mg by mouth daily as needed (for pain/neuropathy).     Marland Kitchen lisinopril (PRINIVIL,ZESTRIL) 10 MG tablet Take 1 tablet (10 mg total) by mouth daily.    . pantoprazole (PROTONIX) 40 MG tablet Take 1 tablet (40 mg total) by mouth 2 (two) times daily.    . pravastatin  (PRAVACHOL) 40 MG tablet Take 1 tablet by mouth at bedtime.     . VENTOLIN HFA 108 (90 BASE) MCG/ACT inhaler Inhale 1 puff into the lungs every 6 (six) hours as needed for wheezing or shortness of breath.     . warfarin (COUMADIN) 3 MG tablet Take 3 mg by mouth daily. Patient takes for 4 days alternating with 4mg     . warfarin (COUMADIN) 4 MG tablet Take 4 mg by mouth daily. Patient takes for 3 days alternating with 3mg  M,W,F    . zolpidem (AMBIEN) 5 MG tablet Take 5 mg by mouth at bedtime.       Review of Systems     Objective:   Physical Exam  Constitutional: He is oriented to person, place, and time. He appears well-developed and well-nourished. No distress.  HENT:  Head: Normocephalic and atraumatic.  Mouth/Throat: Oropharynx is clear and moist. No oropharyngeal exudate.  Eyes: Pupils are equal, round, and reactive to light. No scleral icterus.  Neck: Normal range of motion. Neck supple.  Cardiovascular: Normal rate and regular rhythm.   Murmur heard. Pulmonary/Chest: Effort normal and breath sounds normal. No respiratory distress.  Abdominal: Soft. Bowel sounds are normal. He exhibits no distension. There is no tenderness.  Musculoskeletal: He exhibits no edema.  Lymphadenopathy:    He has no cervical adenopathy.  Neurological: He is alert and oriented to person,  place, and time.  NO FOCAL DEFICITS   Psychiatric: He has a normal mood and affect.  Vitals reviewed.         Assessment & Plan:

## 2014-07-11 NOTE — Assessment & Plan Note (Signed)
PT REPORTS TCS IN PAST 10 YRS-AVERAGE RISK.  WILL OBTAIN REPORT.

## 2014-07-11 NOTE — Patient Instructions (Signed)
WHILE TAKING ANTIBIOTICS YOU MAY NEED A LOWER DOSE OF COUMADIN. I WILL REDUCE YOUR COUMADIN DOSE AND HAVE YOUR INR CHECKED 3 DAYS AFTER TAKING YOUR ANTIBIOTICS.  TAKE PROTONIX 30 MINUTES PRIOR TO MEALS TWICE DAILY FOR 3 MOS THEN ONCE DAILY.  YOU SHOULD TAKE AMOXICILLIN 500 mg 2 TWICE DAILY for 10 days and Biaxin 500 mg po ONE TWICE DAILY FOR 10 days. Med side effects include NVD, abd pain, and metallic taste.   DO NOT TAKE PRAVACHOL WHILE TAKING THE ANTIBIOTICS.   FOLLOW UP IN 4 MOS.   PLEASE CALL WITH QUESTIONS OR CONCERNS RE: BLACK TARRY STOOLS OR RECTAL BLEEDING.

## 2014-07-18 NOTE — Progress Notes (Signed)
cc'ed to pcp °

## 2014-07-22 ENCOUNTER — Telehealth: Payer: Self-pay

## 2014-07-22 NOTE — Telephone Encounter (Signed)
Ryan Shea is aware at [facility].

## 2014-07-22 NOTE — Telephone Encounter (Signed)
T/C from [place] at [facility] in reference to pt's INR result of 1.6. She said it was ordered by Dr. Oneida Alar and they wanted to know if she will adjust coumadin. I told her that Dr. Oneida Alar does not do that, and she said they ususally do it . She just wanted to make sure that Dr. Oneida Alar did not plan to do it. She said they will address with the doctor there and make any changes necessary.  Routing to Laban Emperor, NP in Dr. Oneida Alar absence today.

## 2014-07-22 NOTE — Telephone Encounter (Signed)
No, they need to adjust Coumadin as necessary. The reason we recommended close monitoring of was because he is being treated for H.pylori. The antibiotics can interact with Coumadin and cause an elevated INR, which increases his risk of bleeding. They just need to monitor it closely while on antibiotics and follow standard protocol.

## 2014-07-24 NOTE — Telephone Encounter (Signed)
REVIEWED-NO ADDITIONAL RECOMMENDATIONS. 

## 2014-08-23 ENCOUNTER — Encounter (HOSPITAL_COMMUNITY): Payer: Self-pay | Admitting: Emergency Medicine

## 2014-08-23 ENCOUNTER — Inpatient Hospital Stay (HOSPITAL_COMMUNITY)
Admission: EM | Admit: 2014-08-23 | Discharge: 2014-08-25 | DRG: 394 | Disposition: A | Discharge status: Home / Self Care | Payer: Medicare PPO | Attending: Internal Medicine | Admitting: Internal Medicine

## 2014-08-23 DIAGNOSIS — Z791 Long term (current) use of non-steroidal anti-inflammatories (NSAID): Secondary | ICD-10-CM | POA: Diagnosis not present

## 2014-08-23 DIAGNOSIS — J45909 Unspecified asthma, uncomplicated: Secondary | ICD-10-CM | POA: Diagnosis present

## 2014-08-23 DIAGNOSIS — I4892 Unspecified atrial flutter: Secondary | ICD-10-CM | POA: Diagnosis not present

## 2014-08-23 DIAGNOSIS — K644 Residual hemorrhoidal skin tags: Principal | ICD-10-CM | POA: Diagnosis present

## 2014-08-23 DIAGNOSIS — D649 Anemia, unspecified: Secondary | ICD-10-CM | POA: Diagnosis present

## 2014-08-23 DIAGNOSIS — K922 Gastrointestinal hemorrhage, unspecified: Secondary | ICD-10-CM | POA: Diagnosis not present

## 2014-08-23 DIAGNOSIS — I10 Essential (primary) hypertension: Secondary | ICD-10-CM | POA: Diagnosis present

## 2014-08-23 DIAGNOSIS — J449 Chronic obstructive pulmonary disease, unspecified: Secondary | ICD-10-CM | POA: Diagnosis present

## 2014-08-23 DIAGNOSIS — Z7901 Long term (current) use of anticoagulants: Secondary | ICD-10-CM | POA: Diagnosis not present

## 2014-08-23 DIAGNOSIS — N179 Acute kidney failure, unspecified: Secondary | ICD-10-CM | POA: Diagnosis present

## 2014-08-23 DIAGNOSIS — K6389 Other specified diseases of intestine: Secondary | ICD-10-CM | POA: Diagnosis not present

## 2014-08-23 DIAGNOSIS — Z833 Family history of diabetes mellitus: Secondary | ICD-10-CM

## 2014-08-23 DIAGNOSIS — Z87891 Personal history of nicotine dependence: Secondary | ICD-10-CM | POA: Diagnosis not present

## 2014-08-23 DIAGNOSIS — K5730 Diverticulosis of large intestine without perforation or abscess without bleeding: Secondary | ICD-10-CM | POA: Diagnosis present

## 2014-08-23 DIAGNOSIS — E860 Dehydration: Secondary | ICD-10-CM | POA: Diagnosis present

## 2014-08-23 DIAGNOSIS — K648 Other hemorrhoids: Secondary | ICD-10-CM | POA: Diagnosis not present

## 2014-08-23 DIAGNOSIS — Z823 Family history of stroke: Secondary | ICD-10-CM

## 2014-08-23 DIAGNOSIS — K921 Melena: Secondary | ICD-10-CM | POA: Diagnosis present

## 2014-08-23 DIAGNOSIS — D689 Coagulation defect, unspecified: Secondary | ICD-10-CM | POA: Diagnosis present

## 2014-08-23 DIAGNOSIS — Z8042 Family history of malignant neoplasm of prostate: Secondary | ICD-10-CM | POA: Diagnosis not present

## 2014-08-23 DIAGNOSIS — K625 Hemorrhage of anus and rectum: Secondary | ICD-10-CM | POA: Diagnosis not present

## 2014-08-23 LAB — PROTIME-INR
INR: 1.61 — AB (ref 0.00–1.49)
Prothrombin Time: 19.2 seconds — ABNORMAL HIGH (ref 11.6–15.2)

## 2014-08-23 LAB — CBC WITH DIFFERENTIAL/PLATELET
BASOS ABS: 0 10*3/uL (ref 0.0–0.1)
Basophils Relative: 0 % (ref 0–1)
EOS PCT: 2 % (ref 0–5)
Eosinophils Absolute: 0.2 10*3/uL (ref 0.0–0.7)
HEMATOCRIT: 30.4 % — AB (ref 39.0–52.0)
HEMOGLOBIN: 9.7 g/dL — AB (ref 13.0–17.0)
LYMPHS PCT: 21 % (ref 12–46)
Lymphs Abs: 2.1 10*3/uL (ref 0.7–4.0)
MCH: 28.9 pg (ref 26.0–34.0)
MCHC: 31.9 g/dL (ref 30.0–36.0)
MCV: 90.5 fL (ref 78.0–100.0)
MONOS PCT: 9 % (ref 3–12)
Monocytes Absolute: 0.9 10*3/uL (ref 0.1–1.0)
NEUTROS ABS: 6.7 10*3/uL (ref 1.7–7.7)
Neutrophils Relative %: 68 % (ref 43–77)
Platelets: 273 10*3/uL (ref 150–400)
RBC: 3.36 MIL/uL — AB (ref 4.22–5.81)
RDW: 13.3 % (ref 11.5–15.5)
WBC: 10 10*3/uL (ref 4.0–10.5)

## 2014-08-23 LAB — BASIC METABOLIC PANEL
Anion gap: 8 (ref 5–15)
BUN: 30 mg/dL — ABNORMAL HIGH (ref 6–20)
CO2: 31 mmol/L (ref 22–32)
Calcium: 8.6 mg/dL — ABNORMAL LOW (ref 8.9–10.3)
Chloride: 100 mmol/L — ABNORMAL LOW (ref 101–111)
Creatinine, Ser: 1.41 mg/dL — ABNORMAL HIGH (ref 0.61–1.24)
GFR calc Af Amer: 57 mL/min — ABNORMAL LOW (ref >60)
GFR calc non Af Amer: 49 mL/min — ABNORMAL LOW (ref >60)
Glucose, Bld: 93 mg/dL (ref 65–99)
Potassium: 4.5 mmol/L (ref 3.5–5.1)
Sodium: 139 mmol/L (ref 135–145)

## 2014-08-23 LAB — TYPE AND SCREEN
ABO/RH(D): O POS
ANTIBODY SCREEN: NEGATIVE

## 2014-08-23 LAB — I-STAT CG4 LACTIC ACID, ED: Lactic Acid, Venous: 0.64 mmol/L (ref 0.5–2.0)

## 2014-08-23 MED ORDER — PANTOPRAZOLE SODIUM 40 MG IV SOLR
INTRAVENOUS | Status: AC
  Filled 2014-08-23: qty 160

## 2014-08-23 MED ORDER — ZOLPIDEM TARTRATE 5 MG PO TABS
5.0000 mg | ORAL_TABLET | Freq: Every day | ORAL | Status: DC
  Administered 2014-08-23 – 2014-08-24 (×2): 5 mg via ORAL
  Filled 2014-08-23 (×2): qty 1

## 2014-08-23 MED ORDER — VITAMIN K1 10 MG/ML IJ SOLN
10.0000 mg | Freq: Once | INTRAMUSCULAR | Status: AC
  Administered 2014-08-23: 10 mg via SUBCUTANEOUS
  Filled 2014-08-23: qty 1

## 2014-08-23 MED ORDER — SODIUM CHLORIDE 0.9 % IV SOLN
80.0000 mg | Freq: Once | INTRAVENOUS | Status: AC
  Administered 2014-08-23: 80 mg via INTRAVENOUS
  Filled 2014-08-23: qty 80

## 2014-08-23 MED ORDER — SODIUM CHLORIDE 0.9 % IJ SOLN
3.0000 mL | Freq: Two times a day (BID) | INTRAMUSCULAR | Status: DC
  Administered 2014-08-23 – 2014-08-24 (×2): 3 mL via INTRAVENOUS

## 2014-08-23 MED ORDER — MOMETASONE FURO-FORMOTEROL FUM 100-5 MCG/ACT IN AERO
2.0000 | INHALATION_SPRAY | Freq: Two times a day (BID) | RESPIRATORY_TRACT | Status: DC
  Administered 2014-08-24 – 2014-08-25 (×3): 2 via RESPIRATORY_TRACT
  Filled 2014-08-23: qty 8.8

## 2014-08-23 MED ORDER — CARVEDILOL 3.125 MG PO TABS
6.2500 mg | ORAL_TABLET | Freq: Two times a day (BID) | ORAL | Status: DC
  Administered 2014-08-24 – 2014-08-25 (×3): 6.25 mg via ORAL
  Filled 2014-08-23 (×4): qty 2

## 2014-08-23 MED ORDER — SODIUM CHLORIDE 0.9 % IV SOLN
8.0000 mg/h | INTRAVENOUS | Status: DC
  Administered 2014-08-23 – 2014-08-24 (×2): 8 mg/h via INTRAVENOUS
  Filled 2014-08-23 (×7): qty 80

## 2014-08-23 MED ORDER — ALBUTEROL SULFATE (2.5 MG/3ML) 0.083% IN NEBU: 3.0000 mL | INHALATION_SOLUTION | Freq: Four times a day (QID) | RESPIRATORY_TRACT | Status: DC | PRN

## 2014-08-23 MED ORDER — GABAPENTIN 300 MG PO CAPS: 300.0000 mg | ORAL_CAPSULE | Freq: Three times a day (TID) | ORAL | Status: DC | PRN

## 2014-08-23 MED ORDER — SODIUM CHLORIDE 0.9 % IV SOLN
INTRAVENOUS | Status: AC
  Administered 2014-08-23: 22:00:00 via INTRAVENOUS

## 2014-08-23 MED ORDER — PRAVASTATIN SODIUM 40 MG PO TABS
40.0000 mg | ORAL_TABLET | Freq: Every day | ORAL | Status: DC
  Administered 2014-08-23 – 2014-08-24 (×2): 40 mg via ORAL
  Filled 2014-08-23 (×2): qty 1

## 2014-08-23 NOTE — ED Notes (Signed)
Patient reports coughing up 2 small dark clots up yesterday, then this morning had a bowel movement with bright red blood. Denies any abd pain, nausea, or vomiting. Per patient has hx of GI bleed from perforated peptic ulcers.

## 2014-08-23 NOTE — H&P (Signed)
Ryan Shea is an 69 y.o. male.     Chief Complaint: rectal bleeding HPI: 69 yo male with hx of duodenal ulcers c/o rectal bleeding starting this am.  Pt denies fever, chills, cp, palp, n/v, abd pain, diarrhea.  Pt denies nsaid use. The blood was on the toilet paper and in the toilet bowel.   Pt presented to ED and was found to be anemic with hgb 9.7.  Pt had INR 1.61.  Pt was given vitamin K by ED and started on protonix.  Pt will be admitted for rectal bleeding.   Past Medical History  Diagnosis Date  . Asthma   . Hypertension   . COPD (chronic obstructive pulmonary disease)   . Cancer     Prostate   . Helicobacter pylori gastritis MAR 2016  . Multiple duodenal ulcers MAR 2016    Past Surgical History  Procedure Laterality Date  . Prostate surgery    . Hernia repair    . Esophagogastroduodenoscopy N/A 05/31/2014    H PYLORI GASTRITIS, DUODENAL ULCERS    Family History  Problem Relation Age of Onset  . Stroke Mother   . Cancer Brother   . Cancer Other   . Colon cancer Neg Hx   . Colon polyps Neg Hx    Social History:  reports that he quit smoking about 36 years ago. His smoking use included Cigarettes. He started smoking about 55 years ago. He has a 40 pack-year smoking history. He has never used smokeless tobacco. He reports that he does not drink alcohol or use illicit drugs.  Allergies: No Known Allergies Medications reviewed   Results for orders placed or performed during the hospital encounter of 08/23/14 (from the past 48 hour(s))  CBC with Differential/Platelet     Status: Abnormal   Collection Time: 08/23/14  6:48 PM  Result Value Ref Range   WBC 10.0 4.0 - 10.5 K/uL   RBC 3.36 (L) 4.22 - 5.81 MIL/uL   Hemoglobin 9.7 (L) 13.0 - 17.0 g/dL   HCT 30.4 (L) 39.0 - 52.0 %   MCV 90.5 78.0 - 100.0 fL   MCH 28.9 26.0 - 34.0 pg   MCHC 31.9 30.0 - 36.0 g/dL   RDW 13.3 11.5 - 15.5 %   Platelets 273 150 - 400 K/uL   Neutrophils Relative % 68 43 - 77 %   Neutro Abs  6.7 1.7 - 7.7 K/uL   Lymphocytes Relative 21 12 - 46 %   Lymphs Abs 2.1 0.7 - 4.0 K/uL   Monocytes Relative 9 3 - 12 %   Monocytes Absolute 0.9 0.1 - 1.0 K/uL   Eosinophils Relative 2 0 - 5 %   Eosinophils Absolute 0.2 0.0 - 0.7 K/uL   Basophils Relative 0 0 - 1 %   Basophils Absolute 0.0 0.0 - 0.1 K/uL  Basic metabolic panel     Status: Abnormal   Collection Time: 08/23/14  6:48 PM  Result Value Ref Range   Sodium 139 135 - 145 mmol/L   Potassium 4.5 3.5 - 5.1 mmol/L   Chloride 100 (L) 101 - 111 mmol/L   CO2 31 22 - 32 mmol/L   Glucose, Bld 93 65 - 99 mg/dL   BUN 30 (H) 6 - 20 mg/dL   Creatinine, Ser 1.41 (H) 0.61 - 1.24 mg/dL   Calcium 8.6 (L) 8.9 - 10.3 mg/dL   GFR calc non Af Amer 49 (L) >60 mL/min   GFR calc Af Amer 57 (L) >  60 mL/min    Comment: (NOTE) The eGFR has been calculated using the CKD EPI equation. This calculation has not been validated in all clinical situations. eGFR's persistently <60 mL/min signify possible Chronic Kidney Disease.    Anion gap 8 5 - 15  Protime-INR     Status: Abnormal   Collection Time: 08/23/14  6:48 PM  Result Value Ref Range   Prothrombin Time 19.2 (H) 11.6 - 15.2 seconds   INR 1.61 (H) 0.00 - 1.49  I-Stat CG4 Lactic Acid, ED     Status: None   Collection Time: 08/23/14  6:59 PM  Result Value Ref Range   Lactic Acid, Venous 0.64 0.5 - 2.0 mmol/L   No results found.  Review of Systems  Constitutional: Negative.   HENT: Negative.   Eyes: Negative.   Respiratory: Negative.   Cardiovascular: Negative.   Gastrointestinal: Positive for blood in stool and melena. Negative for heartburn, nausea, vomiting, abdominal pain, diarrhea and constipation.  Genitourinary: Negative.   Musculoskeletal: Negative.   Skin: Negative.   Neurological: Negative.   Endo/Heme/Allergies: Negative.   Psychiatric/Behavioral: Negative.     Blood pressure 136/57, pulse 84, temperature 98.2 F (36.8 C), temperature source Oral, resp. rate 18, height 5'  6" (1.676 m), weight 108.863 kg (240 lb), SpO2 96 %. Physical Exam  Constitutional: He is oriented to person, place, and time. He appears well-developed and well-nourished.  HENT:  Head: Normocephalic and atraumatic.  Mouth/Throat: No oropharyngeal exudate.  Eyes: Conjunctivae and EOM are normal. Pupils are equal, round, and reactive to light. No scleral icterus.  Neck: Normal range of motion. Neck supple. No JVD present. No tracheal deviation present. No thyromegaly present.  Cardiovascular: Normal rate and regular rhythm.  Exam reveals no gallop and no friction rub.   Murmur heard. Respiratory: Effort normal and breath sounds normal. No respiratory distress. He has no wheezes. He has no rales.  GI: Soft. Bowel sounds are normal. He exhibits no distension. There is no tenderness. There is no rebound and no guarding.  Musculoskeletal: Normal range of motion. He exhibits no edema or tenderness.  Lymphadenopathy:    He has no cervical adenopathy.  Neurological: He is alert and oriented to person, place, and time. He has normal reflexes. He displays normal reflexes. No cranial nerve deficit. He exhibits normal muscle tone. Coordination normal.  Skin: Skin is warm and dry. No rash noted. No erythema. No pallor.  Psychiatric: He has a normal mood and affect. His behavior is normal. Judgment and thought content normal.     Assessment/Plan Rectal bleeding NPO  Please obtain GI consultation in am for colonoscopy Start on protonix 74m iv x1, and then 834m/hour  Coagulopathy Pt received vit K in ED, repeat INR in am  Anemia Check cbc in am Consider checking iron studies, b1H20folic acid, esr, spep, upep, tsh  Aflutter Hold coumadin  DVT prophylaxis:  scd   KIJani Gravel/17/2016, 9:29 PM

## 2014-08-23 NOTE — ED Provider Notes (Signed)
CSN: [id number]     Arrival date & time 08/23/14  1733 History   First MD Initiated Contact with Patient 08/23/14 1841     Chief Complaint  Patient presents with  . Rectal Bleeding     (Consider location/radiation/quality/duration/timing/severity/associated sxs/prior Treatment) HPI Comments: Patient presents to the emergency department for evaluation of rectal bleeding. Patient reports that he got up this morning and had a bowel movement with bright red blood. He reports that he filled the toilet with blood, no clots were noted. Since then he has not had any further bleeding. Patient reports, however, he does have a history of upper GI bleed from peptic ulcers. He was going to try to make it until Monday, but then became worried and came to the ER for repeat evaluation. Patient denies abdominal pain. He has not had any chest pain or shortness of breath.  Patient is a 69 y.o. male presenting with hematochezia.  Rectal Bleeding   Past Medical History  Diagnosis Date  . Asthma   . Hypertension   . COPD (chronic obstructive pulmonary disease)   . Cancer     Prostate   . Helicobacter pylori gastritis MAR 2016  . Multiple duodenal ulcers MAR 2016   Past Surgical History  Procedure Laterality Date  . Prostate surgery    . Hernia repair    . Esophagogastroduodenoscopy N/A 05/31/2014    H PYLORI GASTRITIS, DUODENAL ULCERS   Family History  Problem Relation Age of Onset  . Stroke Mother   . Cancer Brother   . Cancer Other   . Colon cancer Neg Hx   . Colon polyps Neg Hx    History  Substance Use Topics  . Smoking status: Former Smoker -- 2.00 packs/day for 20 years    Types: Cigarettes    Start date: 09/01/1959    Quit date: 09/01/1978  . Smokeless tobacco: Never Used     Comment: quit 30 years ago  . Alcohol Use: No    Review of Systems  Gastrointestinal: Positive for blood in stool and hematochezia.  All other systems reviewed and are negative.     Allergies  Review  of patient's allergies indicates no known allergies.  Home Medications   Prior to Admission medications   Medication Sig Start Date End Date Taking? Authorizing Provider  carvedilol (COREG) 6.25 MG tablet Take 1 tablet (6.25 mg total) by mouth 2 (two) times daily with a meal. 05/23/13  Yes [name], MD  ferrous sulfate 325 (65 FE) MG tablet Take 325 mg by mouth daily with breakfast.   Yes Historical Provider, MD  Fluticasone-Salmeterol (ADVAIR DISKUS) 250-50 MCG/DOSE AEPB Inhale 1 puff into the lungs as needed. Patient taking differently: Inhale 1 puff into the lungs daily.  04/23/13  Yes [name], MD  furosemide (LASIX) 40 MG tablet Take 40 mg by mouth daily.  02/13/14  Yes Historical Provider, MD  gabapentin (NEURONTIN) 300 MG capsule Take 300 mg by mouth 3 (three) times daily as needed (for pain/neuropathy).  02/23/14  Yes Historical Provider, MD  ketoprofen (ORUDIS) 75 MG capsule Take 75 mg by mouth 2 (two) times daily.  08/01/14  Yes Historical Provider, MD  lisinopril (PRINIVIL,ZESTRIL) 20 MG tablet  08/08/14  Yes Historical Provider, MD  pantoprazole (PROTONIX) 40 MG tablet 1 po bid for 3 mos then once daily Patient taking differently: Take 40 mg by mouth daily.  07/11/14  Yes Danie Binder, MD  pravastatin (PRAVACHOL) 40 MG tablet Take 1  tablet by mouth at bedtime.  07/06/13  Yes Historical Provider, MD  VENTOLIN HFA 108 (90 BASE) MCG/ACT inhaler Inhale 1 puff into the lungs every 6 (six) hours as needed for wheezing or shortness of breath.  05/10/14  Yes Historical Provider, MD  warfarin (COUMADIN) 3 MG tablet Take 3 mg by mouth See admin instructions. Take 4mg  on Mondays and Tuesdays.Take 3mg  on all other days 06/07/14  Yes Historical Provider, MD  warfarin (COUMADIN) 4 MG tablet Take 4 mg by mouth 2 (two) times a week. Take 4mg  on Mondays and Tuesdays.Take 3mg  on all other days   Yes Historical Provider, MD  zolpidem (AMBIEN) 5 MG tablet Take 5 mg by mouth at bedtime.  06/25/14   Yes Historical Provider, MD  dextromethorphan-guaiFENesin (MUCINEX DM) 30-600 MG per 12 hr tablet Take 1 tablet by mouth 2 (two) times daily as needed for cough. Patient not taking: Reported on 08/23/2014 05/14/14   Rolland Porter, MD   BP 136/57 mmHg  Pulse 84  Temp(Src) 98.2 F (36.8 C) (Oral)  Resp 18  Ht 5\' 6"  (1.676 m)  Wt 240 lb (108.863 kg)  BMI 38.76 kg/m2  SpO2 96% Physical Exam  Constitutional: He is oriented to person, place, and time. He appears well-developed and well-nourished. No distress.  HENT:  Head: Normocephalic and atraumatic.  Right Ear: Hearing normal.  Left Ear: Hearing normal.  Nose: Nose normal.  Mouth/Throat: Oropharynx is clear and moist and mucous membranes are normal.  Eyes: Conjunctivae and EOM are normal. Pupils are equal, round, and reactive to light.  Neck: Normal range of motion. Neck supple.  Cardiovascular: Regular rhythm, S1 normal and S2 normal.  Exam reveals no gallop and no friction rub.   No murmur heard. Pulmonary/Chest: Effort normal and breath sounds normal. No respiratory distress. He exhibits no tenderness.  Abdominal: Soft. Normal appearance and bowel sounds are normal. There is no hepatosplenomegaly. There is no tenderness. There is no rebound, no guarding, no tenderness at McBurney's point and negative Murphy's sign. No hernia.  Genitourinary: Rectal exam shows no mass and anal tone normal. Guaiac positive stool (Stool was black).  Musculoskeletal: Normal range of motion.  Neurological: He is alert and oriented to person, place, and time. He has normal strength. No cranial nerve deficit or sensory deficit. Coordination normal. GCS eye subscore is 4. GCS verbal subscore is 5. GCS motor subscore is 6.  Skin: Skin is warm, dry and intact. No rash noted. No cyanosis.  Psychiatric: He has a normal mood and affect. His speech is normal and behavior is normal. Thought content normal.  Nursing note and vitals reviewed.   ED Course  Procedures  (including critical care time) Labs Review Labs Reviewed  CBC WITH DIFFERENTIAL/PLATELET - Abnormal; Notable for the following:    RBC 3.36 (*)    Hemoglobin 9.7 (*)    HCT 30.4 (*)    All other components within normal limits  BASIC METABOLIC PANEL - Abnormal; Notable for the following:    Chloride 100 (*)    BUN 30 (*)    Creatinine, Ser 1.41 (*)    Calcium 8.6 (*)    GFR calc non Af Amer 49 (*)    GFR calc Af Amer 57 (*)    All other components within normal limits  PROTIME-INR - Abnormal; Notable for the following:    Prothrombin Time 19.2 (*)    INR 1.61 (*)    All other components within normal limits  I-STAT CG4 LACTIC  ACID, ED  POC OCCULT BLOOD, ED    Imaging Review No results found.   EKG Interpretation   Date/Time:  Friday August 23 2014 21:00:15 EDT Ventricular Rate:  81 PR Interval:  165 QRS Duration: 108 QT Interval:  382 QTC Calculation: 443 R Axis:   47 Text Interpretation:  Sinus rhythm Abnormal R-wave progression, early  transition Probable inferior infarct, old Confirmed by Eyleen Rawlinson  MD,  Yannick Steuber [phone]) on 08/23/2014 9:34:13 PM      MDM   Final diagnoses:  Gastrointestinal hemorrhage, unspecified gastritis, unspecified gastrointestinal hemorrhage type    Patient presents to the ER for evaluation of rectal bleeding. Patient had episode of large-volume bright red blood per rectum earlier today. Patient does have a history of duodenal ulcer and H. pylori infection. He had an upper GI bleed in March secondary to his ulcers. Patient's hemoglobin is 9.7, above where he was when he had his previous GI bleed. He is, however, on Coumadin for history of a flutter. Patient is currently in sinus rhythm. INR is subtherapeutic at 1.6. Patient administered vitamin K 10 mg subcutaneous. Rectal exam revealed black stool that was heme positive. This is concerning for upper GI bleed. He was passing bright red blood per rectum he did have a brisk upper GI bleed and  will require monitoring. Patient will be admitted to step down unit for monitoring of H&H.    Orpah Greek, MD 08/23/14 2137

## 2014-08-24 DIAGNOSIS — J449 Chronic obstructive pulmonary disease, unspecified: Secondary | ICD-10-CM

## 2014-08-24 DIAGNOSIS — N179 Acute kidney failure, unspecified: Secondary | ICD-10-CM | POA: Diagnosis present

## 2014-08-24 DIAGNOSIS — Z7901 Long term (current) use of anticoagulants: Secondary | ICD-10-CM

## 2014-08-24 DIAGNOSIS — K625 Hemorrhage of anus and rectum: Secondary | ICD-10-CM

## 2014-08-24 DIAGNOSIS — K922 Gastrointestinal hemorrhage, unspecified: Secondary | ICD-10-CM

## 2014-08-24 DIAGNOSIS — I4892 Unspecified atrial flutter: Secondary | ICD-10-CM

## 2014-08-24 DIAGNOSIS — D649 Anemia, unspecified: Secondary | ICD-10-CM

## 2014-08-24 LAB — CBC
HCT: 28.9 % — ABNORMAL LOW (ref 39.0–52.0)
HEMOGLOBIN: 9.2 g/dL — AB (ref 13.0–17.0)
MCH: 29.1 pg (ref 26.0–34.0)
MCHC: 31.8 g/dL (ref 30.0–36.0)
MCV: 91.5 fL (ref 78.0–100.0)
Platelets: 234 10*3/uL (ref 150–400)
RBC: 3.16 MIL/uL — ABNORMAL LOW (ref 4.22–5.81)
RDW: 12.8 % (ref 11.5–15.5)
WBC: 7.2 10*3/uL (ref 4.0–10.5)

## 2014-08-24 LAB — MRSA PCR SCREENING: MRSA BY PCR: NEGATIVE

## 2014-08-24 LAB — PROTIME-INR
INR: 1.59 — AB (ref 0.00–1.49)
Prothrombin Time: 19 seconds — ABNORMAL HIGH (ref 11.6–15.2)

## 2014-08-24 LAB — COMPREHENSIVE METABOLIC PANEL
ALK PHOS: 55 U/L (ref 38–126)
ALT: 10 U/L — ABNORMAL LOW (ref 17–63)
ANION GAP: 3 — AB (ref 5–15)
AST: 14 U/L — ABNORMAL LOW (ref 15–41)
Albumin: 3.1 g/dL — ABNORMAL LOW (ref 3.5–5.0)
BILIRUBIN TOTAL: 0.6 mg/dL (ref 0.3–1.2)
BUN: 22 mg/dL — AB (ref 6–20)
CHLORIDE: 103 mmol/L (ref 101–111)
CO2: 30 mmol/L (ref 22–32)
Calcium: 8 mg/dL — ABNORMAL LOW (ref 8.9–10.3)
Creatinine, Ser: 1.16 mg/dL (ref 0.61–1.24)
GFR calc non Af Amer: >60 mL/min (ref >60)
GLUCOSE: 95 mg/dL (ref 65–99)
Potassium: 4 mmol/L (ref 3.5–5.1)
SODIUM: 136 mmol/L (ref 135–145)
TOTAL PROTEIN: 6.5 g/dL (ref 6.5–8.1)

## 2014-08-24 LAB — APTT: APTT: 35 s (ref 24–37)

## 2014-08-24 MED ORDER — PEG 3350-KCL-NA BICARB-NACL 420 G PO SOLR
4000.0000 mL | Freq: Once | ORAL | Status: AC
  Administered 2014-08-24: 4000 mL via ORAL
  Filled 2014-08-24: qty 4000

## 2014-08-24 MED ORDER — PANTOPRAZOLE SODIUM 40 MG IV SOLR
INTRAVENOUS | Status: AC
  Filled 2014-08-24: qty 80

## 2014-08-24 MED ORDER — SODIUM CHLORIDE 0.9 % IV SOLN
INTRAVENOUS | Status: DC
  Administered 2014-08-24: 23:00:00 via INTRAVENOUS

## 2014-08-24 MED ORDER — PANTOPRAZOLE SODIUM 40 MG IV SOLR
40.0000 mg | INTRAVENOUS | Status: DC
  Administered 2014-08-24: 40 mg via INTRAVENOUS
  Filled 2014-08-24: qty 40

## 2014-08-24 NOTE — Progress Notes (Signed)
Pt transferred to Rm [id number] in stable condition by RN and NT, report given to [place], Therapist, sports.

## 2014-08-24 NOTE — Progress Notes (Addendum)
[facility] PROGRESS NOTE  Ryan Shea VZD:[id number] DOB: 09-Jul-1945 DOA: 08/23/2014 PCP: Bronson Curb, PA-C  Assessment/Plan: 1. GI bleeding. Patient reports having bright red stools yesterday. He has not had any further episodes since then. He denies any shortness of breath, lightheadedness, chest pain, vomiting. Complicating factors include chronic anticoagulation on Coumadin, daily NSAID use. Reports his last colonoscopy was 10-12 years ago. Gastroenterology has been consulted. 2. Anemia, chronic, possibly related to chronic disease. Appears stable at this time. Continue to follow. 3. Hypertension. Blood pressure appears to be stable. 4. Atrial flutter. Continue Coreg. Coumadin is currently on hold. 5. COPD. Appears to be at baseline. Continue bronchodilators. 6. Acute renal failure. Likely related to an element of dehydration. Improved with IV fluids.   Code Status: full code Family Communication: discussed with patient Disposition Plan: discharge home once improved   Consultants:  gastroenterology  Procedures:    Antibiotics:    HPI/Subjective: No rectal bleeding since episode yesterday. Denies lightheadedness. No nausea or vomiting, no abdominal pain  Objective: Filed Vitals:   08/24/14 0800  BP: 123/42  Pulse: 78  Temp:   Resp: 15    Intake/Output Summary (Last 24 hours) at 08/24/14 0852 Last data filed at 08/24/14 0802  Gross per 24 hour  Intake 738.75 ml  Output    600 ml  Net 138.75 ml   Filed Weights   08/23/14 1805 08/23/14 2214 08/24/14 0400  Weight: 108.863 kg (240 lb) 105.8 kg (233 lb 4 oz) 105 kg (231 lb 7.7 oz)    Exam:   General:  NAD  Cardiovascular: S1, S2 RRR  Respiratory: CTA B  Abdomen: soft, nt, nd, bs+  Musculoskeletal: no edema b/l   Data Reviewed: Basic Metabolic Panel:  Recent Labs Lab 08/23/14 1848 08/24/14 0533  NA 139 136  K 4.5 4.0  CL 100* 103  CO2 31 30  GLUCOSE 93 95  BUN 30* 22*   CREATININE 1.41* 1.16  CALCIUM 8.6* 8.0*   Liver Function Tests:  Recent Labs Lab 08/24/14 0533  AST 14*  ALT 10*  ALKPHOS 55  BILITOT 0.6  PROT 6.5  ALBUMIN 3.1*   No results for input(s): LIPASE, AMYLASE in the last 168 hours. No results for input(s): AMMONIA in the last 168 hours. CBC:  Recent Labs Lab 08/23/14 1848 08/24/14 0533  WBC 10.0 7.2  NEUTROABS 6.7  --   HGB 9.7* 9.2*  HCT 30.4* 28.9*  MCV 90.5 91.5  PLT 273 234   Cardiac Enzymes: No results for input(s): CKTOTAL, CKMB, CKMBINDEX, TROPONINI in the last 168 hours. BNP (last 3 results) No results for input(s): BNP in the last 8760 hours.  ProBNP (last 3 results) No results for input(s): PROBNP in the last 8760 hours.  CBG: No results for input(s): GLUCAP in the last 168 hours.  Recent Results (from the past 240 hour(s))  MRSA PCR Screening     Status: None   Collection Time: 08/23/14 10:09 PM  Result Value Ref Range Status   MRSA by PCR NEGATIVE NEGATIVE Final    Comment:        The GeneXpert MRSA Assay (FDA approved for NASAL specimens only), is one component of a comprehensive MRSA colonization surveillance program. It is not intended to diagnose MRSA infection nor to guide or monitor treatment for MRSA infections.      Studies: No results found.  Scheduled Meds: . carvedilol  6.25 mg Oral BID WC  . mometasone-formoterol  2 puff Inhalation BID  .  pravastatin  40 mg Oral QHS  . sodium chloride  3 mL Intravenous Q12H  . zolpidem  5 mg Oral QHS   Continuous Infusions: . sodium chloride 75 mL/hr at 08/23/14 2211  . pantoprozole (PROTONIX) infusion 8 mg/hr (08/24/14 0802)    Principal Problem:   Rectal bleeding Active Problems:   HTN (hypertension)   COPD (chronic obstructive pulmonary disease)   Atrial flutter   GI bleed   Anemia   Acute renal failure   Chronic anticoagulation    Time spent: 78mins    MEMON,JEHANZEB  [facility] Pager [phone]. If 7PM-7AM,  please contact night-coverage at [email or web address], password [facility] 08/24/2014, 8:52 AM  LOS: 1 day

## 2014-08-24 NOTE — Consult Note (Addendum)
Referring Provider: No ref. provider found Primary Care Physician:  Mackey Birchwood Primary Gastroenterologist:  Dr. Barney Drain, MD  Reason for Consultation:    Rectal bleeding and anemia.  HPI:   Patient is 69 year old Caucasian male with multiple medical problems who was in usual state of health until yesterday morning when he passed bright red blood per rectum along with his bowel movement. He describes amount to be at least moderate. He did not experience abdominal pain nausea vomiting or postural symptoms. Since patient is on warfarin and suffered 4 unit upper GI bleed in March this year he decided to come to emergency room. Hemoglobin was 9.7 and INR was 1.61. Patient was given 10 mg of vitamin K subcutaneously and he was admitted to ICU. Patient has not passed any more blood per rectum. He denies abdominal pain nausea vomiting heartburn or dysphagia. He is hungry. He does not take aspirin or other NSAIDs. His last colonoscopy was about 12 years ago and he is in the process of scheduling one in near future. He had she has an appointment [facility] on [facility]. He denies diarrhea and/or chronic constipation. He has normal appetite. He has not lost any weight recently. He states he is not able to exercise much in a car of his breathing problems. He denies chest pain or dyspnea at rest.  GI history is as follows; Screening colonoscopy about 12 years ago was normal. Patient hospitalized for melena and anemia in March 2016. Hemoglobin on admission was 4.6 g and INR was supratherapeutic. He received 4 units of PRBCs. He underwent EGD by Dr. Oneida Alar and found to have duodenal ulcers and H. pylori infection. He was treated amoxicillin, clarithromycin PPI for 10 days about 6 weeks ago. He was able to finish treatment. H&H and 06/12/2014 was 9.3 and 28.6.    Past Medical History  Diagnosis Date  . Asthma   . Hypertension   . COPD (chronic obstructive pulmonary disease)   . Cancer      Prostate   . Helicobacter pylori gastritis MAR 2016  . Multiple duodenal ulcers MAR 2016    Past Surgical History  Procedure Laterality Date  . Prostate surgery    .  umbilicus herniorrhaphy 3     . Esophagogastroduodenoscopy N/A 05/31/2014    H PYLORI GASTRITIS, DUODENAL ULCERS  . Colonoscopy      Prior to Admission medications   Medication Sig Start Date End Date Taking? Authorizing Provider  carvedilol (COREG) 6.25 MG tablet Take 1 tablet (6.25 mg total) by mouth 2 (two) times daily with a meal. 05/23/13  Yes [name], MD  ferrous sulfate 325 (65 FE) MG tablet Take 325 mg by mouth daily with breakfast.   Yes Historical Provider, MD  Fluticasone-Salmeterol (ADVAIR DISKUS) 250-50 MCG/DOSE AEPB Inhale 1 puff into the lungs as needed. Patient taking differently: Inhale 1 puff into the lungs daily.  04/23/13  Yes [name], MD  furosemide (LASIX) 40 MG tablet Take 40 mg by mouth daily.  02/13/14  Yes Historical Provider, MD  gabapentin (NEURONTIN) 300 MG capsule Take 300 mg by mouth 3 (three) times daily as needed (for pain/neuropathy).  02/23/14  Yes Historical Provider, MD  ketoprofen (ORUDIS) 75 MG capsule Take 75 mg by mouth 2 (two) times daily.  08/01/14  Yes Historical Provider, MD  lisinopril (PRINIVIL,ZESTRIL) 20 MG tablet  08/08/14  Yes Historical Provider, MD  pantoprazole (PROTONIX) 40 MG tablet 1 po bid for 3 mos then once  daily Patient taking differently: Take 40 mg by mouth daily.  07/11/14  Yes Danie Binder, MD  pravastatin (PRAVACHOL) 40 MG tablet Take 1 tablet by mouth at bedtime.  07/06/13  Yes Historical Provider, MD  VENTOLIN HFA 108 (90 BASE) MCG/ACT inhaler Inhale 1 puff into the lungs every 6 (six) hours as needed for wheezing or shortness of breath.  05/10/14  Yes Historical Provider, MD  warfarin (COUMADIN) 3 MG tablet Take 3 mg by mouth See admin instructions. Take 4mg  on Mondays and Tuesdays.Take 3mg  on all other days 06/07/14  Yes Historical Provider, MD   warfarin (COUMADIN) 4 MG tablet Take 4 mg by mouth 2 (two) times a week. Take 4mg  on Mondays and Tuesdays.Take 3mg  on all other days   Yes Historical Provider, MD  zolpidem (AMBIEN) 5 MG tablet Take 5 mg by mouth at bedtime.  06/25/14  Yes Historical Provider, MD  dextromethorphan-guaiFENesin (MUCINEX DM) 30-600 MG per 12 hr tablet Take 1 tablet by mouth 2 (two) times daily as needed for cough. Patient not taking: Reported on 08/23/2014 05/14/14   Rolland Porter, MD    Current Facility-Administered Medications  Medication Dose Route Frequency Provider Last Rate Last Dose  . albuterol (PROVENTIL) (2.5 MG/3ML) 0.083% nebulizer solution 3 mL  3 mL Inhalation Q6H PRN Jani Gravel, MD      . carvedilol (COREG) tablet 6.25 mg  6.25 mg Oral BID WC Jani Gravel, MD   6.25 mg at 08/24/14 0902  . gabapentin (NEURONTIN) capsule 300 mg  300 mg Oral TID PRN Jani Gravel, MD      . mometasone-formoterol [facility]) 100-5 MCG/ACT inhaler 2 puff  2 puff Inhalation BID Jani Gravel, MD   2 puff at 08/24/14 [id number]  . pantoprazole (PROTONIX) injection 40 mg  40 mg Intravenous Q24H Kathie Dike, MD      . pravastatin (PRAVACHOL) tablet 40 mg  40 mg Oral QHS Jani Gravel, MD   40 mg at 08/23/14 2225  . sodium chloride 0.9 % injection 3 mL  3 mL Intravenous Q12H Jani Gravel, MD   3 mL at 08/23/14 2226  . zolpidem (AMBIEN) tablet 5 mg  5 mg Oral QHS Jani Gravel, MD   5 mg at 08/23/14 2225    Allergies as of 08/23/2014  . (No Known Allergies)    Family History  Problem Relation Age of Onset  . Stroke Mother   . Cancer Brother   . Cancer Other   . Colon cancer Neg Hx   . Colon polyps Neg Hx    He has two brothers and 2 sisters living. One brother is diseased of prostate carcinoma at age 3 or 84. 2 brothers were living have been treated for prostate carcinoma and there also diabetic.  History   Social History  . Marital Status: Married    Spouse Name: N/A  . Number of Children: N/A  . Years of Education: N/A  He has 2 biologic and  2 step children and they are all grown not been in good health. He retired 8 years ago. 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    Occupational History  . Not on file.   Social History Main Topics  . Smoking status: Former Smoker -- 2.00 packs/day for 20 years    Types: Cigarettes    Start date: 09/01/1959    Quit date: 09/01/1978  . Smokeless tobacco: Former Systems developer     Comment: quit 30 years ago  . Alcohol  Use: No  . Drug Use: No  . Sexual Activity: Not on file   Other Topics Concern  . Not on file   Social History Narrative    Review of Systems: See HPI, otherwise normal ROS  Physical Exam: Temp:  [98.1 F (36.7 C)-98.3 F (36.8 C)] 98.1 F (36.7 C) (06/18 0759) Pulse Rate:  [73-86] 78 (06/18 0800) Resp:  [12-20] 15 (06/18 0800) BP: (109-144)/(35-70) 123/42 mmHg (06/18 0800) SpO2:  [89 %-100 %] 97 % (06/18 0853) Weight:  [231 lb 7.7 oz (105 kg)-240 lb (108.863 kg)] 231 lb 7.7 oz (105 kg) (06/18 0400) Last BM Date: 08/23/14 Patient is alert and in no acute distress. Conjunctiva is pale and sclerae nonicteric. Oropharyngeal mucosa is normal.  He has upper and lower dentures in place. No neck masses or thyromegaly noted. Cardiac exam with regular rhythm normal S1 and S2. Grade 2/6 systolic ejection murmur heard at left sternal border and aortic area. Auscultation of lungs revealed visit her breast sounds bilaterally without rales or rhonchi. Abdomen is protuberant with normal bowel sounds. Lower midline scar noted. On palpation is soft and nontender without organomegaly or masses. He does not have clubbing. He has some ecchymosis involving the medial aspect of left thigh and trace edema around ankles. I Lab Results:  Recent Labs  08/23/14 1848 08/24/14 0533  WBC 10.0 7.2  HGB 9.7* 9.2*  HCT 30.4* 28.9*  PLT 273 234   BMET  Recent Labs  08/23/14 1848 08/24/14 0533  NA 139 136  K 4.5 4.0  CL 100* 103  CO2 31 30  GLUCOSE 93 95  BUN  30* 22*  CREATININE 1.41* 1.16  CALCIUM 8.6* 8.0*   LFT  Recent Labs  08/24/14 0533  PROT 6.5  ALBUMIN 3.1*  AST 14*  ALT 10*  ALKPHOS 55  BILITOT 0.6   PT/INR  Recent Labs  08/23/14 1848 08/24/14 0533  LABPROT 19.2* 19.0*  INR 1.61* 1.59*     Assessment;  Patient is 69 year old Caucasian male with multiple medical problems who is chronically anticoagulated for history of atrial flutter presents with painless hematochezia and anemia with hemoglobin of 9.7. Patient's INR was subtherapeutic and he has received 10 mg of vitamin K last evening. Past history significant for 4 unit upper GI bleed secondary to duodenal ulcer in March this year and he has been treated for H. pylori infection. Suspect diverticular or hemorrhoidal bleed. Other possibilities include neoplasm. Patient is hemodynamically stable and his hemoglobin has only dropped by half a grams since admission.   Recommendations;  INR with a.m. Lab. Patient will be prepped with PEG solution this afternoon. Colonoscopy tomorrow morning.   LOS: 1 day   REHMAN,NAJEEB U  08/24/2014, 10:32 AM

## 2014-08-25 ENCOUNTER — Encounter (HOSPITAL_COMMUNITY)
Admission: EM | Disposition: A | Discharge status: Home / Self Care | Payer: Self-pay | Source: Home / Self Care | Attending: Internal Medicine

## 2014-08-25 DIAGNOSIS — K648 Other hemorrhoids: Secondary | ICD-10-CM

## 2014-08-25 DIAGNOSIS — K5730 Diverticulosis of large intestine without perforation or abscess without bleeding: Secondary | ICD-10-CM

## 2014-08-25 DIAGNOSIS — D649 Anemia, unspecified: Secondary | ICD-10-CM

## 2014-08-25 DIAGNOSIS — K6389 Other specified diseases of intestine: Secondary | ICD-10-CM

## 2014-08-25 HISTORY — PX: COLONOSCOPY: SHX5424

## 2014-08-25 LAB — PROTIME-INR
INR: 1.24 (ref 0.00–1.49)
PROTHROMBIN TIME: 15.7 s — AB (ref 11.6–15.2)

## 2014-08-25 LAB — CBC
HEMATOCRIT: 31.7 % — AB (ref 39.0–52.0)
Hemoglobin: 10.2 g/dL — ABNORMAL LOW (ref 13.0–17.0)
MCH: 28.6 pg (ref 26.0–34.0)
MCHC: 32.2 g/dL (ref 30.0–36.0)
MCV: 88.8 fL (ref 78.0–100.0)
Platelets: 231 10*3/uL (ref 150–400)
RBC: 3.57 MIL/uL — AB (ref 4.22–5.81)
RDW: 13 % (ref 11.5–15.5)
WBC: 9.5 10*3/uL (ref 4.0–10.5)

## 2014-08-25 SURGERY — COLONOSCOPY
Anesthesia: Moderate Sedation

## 2014-08-25 MED ORDER — WARFARIN SODIUM 4 MG PO TABS
4.0000 mg | ORAL_TABLET | ORAL | Status: DC
Start: 2014-08-25 — End: 2015-03-03

## 2014-08-25 MED ORDER — MIDAZOLAM HCL 5 MG/5ML IJ SOLN
INTRAMUSCULAR | Status: DC | PRN
  Administered 2014-08-25 (×2): 1 mg via INTRAVENOUS
  Administered 2014-08-25: 2 mg via INTRAVENOUS

## 2014-08-25 MED ORDER — MIDAZOLAM HCL 5 MG/5ML IJ SOLN
INTRAMUSCULAR | Status: AC
  Filled 2014-08-25: qty 5

## 2014-08-25 MED ORDER — PANTOPRAZOLE SODIUM 40 MG PO TBEC: 40.0000 mg | DELAYED_RELEASE_TABLET | Freq: Every day | ORAL | Status: AC

## 2014-08-25 MED ORDER — WARFARIN SODIUM 3 MG PO TABS: 3.0000 mg | ORAL_TABLET | ORAL | Status: DC

## 2014-08-25 MED ORDER — FLUTICASONE-SALMETEROL 250-50 MCG/DOSE IN AEPB: 1.0000 | INHALATION_SPRAY | Freq: Every day | RESPIRATORY_TRACT | Status: AC

## 2014-08-25 MED ORDER — MEPERIDINE HCL 50 MG/ML IJ SOLN
INTRAMUSCULAR | Status: AC
  Filled 2014-08-25: qty 1

## 2014-08-25 MED ORDER — MEPERIDINE HCL 50 MG/ML IJ SOLN
INTRAMUSCULAR | Status: DC | PRN
  Administered 2014-08-25: 25 mg

## 2014-08-25 NOTE — Progress Notes (Signed)
1120 D/C instructions and paperwork given to patient with patient's wife and daughter at the bedside. Patient confirmed understanding to continue coumadin beginning again on 08/27/14 as ordered by the MD and to f/u with [facility] @9am  on 08/27/14. IV cathter removed from LEFT forearm, cathter tip intact, no s/s of infection or redness noted, patient tolerated well with no c/o pain or discomfort noted. Patient assisted to vehicle via w/c by staff.

## 2014-08-25 NOTE — Op Note (Signed)
COLONOSCOPY PROCEDURE REPORT  PATIENT:  Ryan Shea  MR#:  [id number] Birthdate:  1945/07/30, 69 y.o., male Endoscopist:  Dr. Rogene Houston, MD Referred By:  Dr. Kathie Dike, MD Procedure Date: 08/25/2014  Procedure:   Colonoscopy  Indications:  Patient is 69 year old Caucasian male who presents with pain less hematochezia and anemia. He has history of 4 unit UGI bleed back in March this year.  Informed Consent:  The procedure and risks were reviewed with the patient and informed consent was obtained.  Medications:  Demerol 25 mg IV Versed 4 mg IV  Description of procedure:  After a digital rectal exam was performed, that colonoscope was advanced from the anus through the rectum and colon to the area of the cecum, ileocecal valve and appendiceal orifice. The cecum was deeply intubated. These structures were well-seen and photographed for the record. From the level of the cecum and ileocecal valve, the scope was slowly and cautiously withdrawn. The mucosal surfaces were carefully surveyed utilizing scope tip to flexion to facilitate fold flattening as needed. The scope was pulled down into the rectum where a thorough exam including retroflexion was performed.  Findings:   Prep excellent. No evidence fresh or old blood in the colon. Moderate number of diverticula at sigmoid colon. Small hemorrhoids below the dentate line and two papillae.   Therapeutic/Diagnostic Maneuvers Performed:   None  Complications:  None  Cecal Withdrawal Time:  11 minutes  Impression:  Examination performed to cecum. Moderate number of diverticula at sigmoid colon without stigmata of bleed. Small external hemorrhoids and anal papillae.  Comment; Bleeding source most likely hemorrhoidal band diverticular bleed. Hemoglobin is 10.2 g.  Recommendations:  Advance diet. Can resume warfarin on 08/27/2014. Follow with Dr. Oneida Alar in one month.  REHMAN,NAJEEB U  08/25/2014 9:38 AM  CC: Dr.  Bronson Curb, PA-C & Dr. Rayne Du ref. provider found CC: Dr. Barney Drain, MD

## 2014-08-25 NOTE — Discharge Summary (Signed)
Physician Discharge Summary  Ryan Shea KYH:[id number] DOB: 09-03-1945 DOA: 08/23/2014  PCP: Bronson Curb, PA-C  Admit date: 08/23/2014 Discharge date: 08/25/2014  Time spent: 40 minutes  Recommendations for Outpatient Follow-up:  1. Follow up with Dr. Oneida Alar in 1 month for repeat CBC  Discharge Diagnoses:  Principal Problem:   Rectal bleeding Active Problems:   HTN (hypertension)   COPD (chronic obstructive pulmonary disease)   Atrial flutter   GI bleed   Anemia   Acute renal failure   Chronic anticoagulation   Discharge Condition: improved  Diet recommendation: low salt  Filed Weights   08/23/14 2214 08/24/14 0400 08/25/14 0605  Weight: 105.8 kg (233 lb 4 oz) 105 kg (231 lb 7.7 oz) 104.645 kg (230 lb 11.2 oz)    History of present illness:  This patient came to the hospital with rectal bleeding. He denied any fevers, chills, chest pain, palpitations, nausea or vomiting. He was found be anemic in the emergency room with a hemoglobin of 9.7, which was close to baseline. He was taking Coumadin for history of atrial flutter prior to admission. He was admitted for further treatments.  Hospital Course:  Patient was admitted to the hospital. Hemoglobin remained stable during his hospital stay and he did not require any transfusion of PRBCs. He was seen by gastroenterology and underwent colonoscopy. Impression was that bleed was likely hemorrhoidal in nature. Review of prior to admission medications indicated that patient was taking daily NSAIDs. He was advised not to further take any NSAIDs, especially since he is on anticoagulation. Per GI, it was recommended to restart Coumadin on 6/21. Patient's hemoglobin is currently stable. He does not have any complaints and is feeling quite well. He is felt stable for discharge at this point.  Procedures: 08/25/14 colonoscopy: Impression:  Examination performed to cecum. Moderate number of diverticula at sigmoid colon without  stigmata of bleed. Small external hemorrhoids and anal papillae.  Comment; Bleeding source most likely hemorrhoidal band diverticular bleed.  Consultations:  gastroenterology  Discharge Exam: Filed Vitals:   08/25/14 0950  BP: 121/59  Pulse: 85  Temp:   Resp:     General: NAd Cardiovascular: S1, S2 RRR Respiratory: CTA B  Discharge Instructions   Discharge Instructions    Diet - low sodium heart healthy    Complete by:  As directed      Increase activity slowly    Complete by:  As directed           Discharge Medication List as of 08/25/2014 10:56 AM    CONTINUE these medications which have CHANGED   Details  Fluticasone-Salmeterol (ADVAIR DISKUS) 250-50 MCG/DOSE AEPB Inhale 1 puff into the lungs daily., Starting 08/25/2014, Until Discontinued, Normal    pantoprazole (PROTONIX) 40 MG tablet Take 1 tablet (40 mg total) by mouth daily., Starting 08/25/2014, Until Discontinued, Normal    !! warfarin (COUMADIN) 3 MG tablet Take 1 tablet (3 mg total) by mouth See admin instructions. Take 4mg  on Mondays and Tuesdays.Take 3mg  on all other days. Restart on 08/27/14, Starting 08/25/2014, Until Discontinued, No Print    !! warfarin (COUMADIN) 4 MG tablet Take 1 tablet (4 mg total) by mouth 2 (two) times a week. Take 4mg  on Mondays and Tuesdays.Take 3mg  on all other days. Restart on 08/27/14, Starting 08/25/2014, Until Discontinued, No Print     !! - Potential duplicate medications found. Please discuss with provider.    CONTINUE these medications which have NOT CHANGED   Details  carvedilol (  COREG) 6.25 MG tablet Take 1 tablet (6.25 mg total) by mouth 2 (two) times daily with a meal., Starting 05/23/2013, Until Discontinued, Normal    ferrous sulfate 325 (65 FE) MG tablet Take 325 mg by mouth daily with breakfast., Until Discontinued, Historical Med    furosemide (LASIX) 40 MG tablet Take 40 mg by mouth daily. , Starting 02/13/2014, Until Discontinued, Historical Med     gabapentin (NEURONTIN) 300 MG capsule Take 300 mg by mouth 3 (three) times daily as needed (for pain/neuropathy). , Starting 02/23/2014, Until Discontinued, Historical Med    lisinopril (PRINIVIL,ZESTRIL) 20 MG tablet Starting 08/08/2014, Until Discontinued, Historical Med    pravastatin (PRAVACHOL) 40 MG tablet Take 1 tablet by mouth at bedtime. , Starting 07/06/2013, Until Discontinued, Historical Med    VENTOLIN HFA 108 (90 BASE) MCG/ACT inhaler Inhale 1 puff into the lungs every 6 (six) hours as needed for wheezing or shortness of breath. , Starting 05/10/2014, Until Discontinued, Historical Med    zolpidem (AMBIEN) 5 MG tablet Take 5 mg by mouth at bedtime. , Starting 06/25/2014, Until Discontinued, Historical Med      STOP taking these medications     ketoprofen (ORUDIS) 75 MG capsule      dextromethorphan-guaiFENesin (MUCINEX DM) 30-600 MG per 12 hr tablet        No Known Allergies    The results of significant diagnostics from this hospitalization (including imaging, microbiology, ancillary and laboratory) are listed below for reference.    Significant Diagnostic Studies: No results found.  Microbiology: Recent Results (from the past 240 hour(s))  MRSA PCR Screening     Status: None   Collection Time: 08/23/14 10:09 PM  Result Value Ref Range Status   MRSA by PCR NEGATIVE NEGATIVE Final    Comment:        The GeneXpert MRSA Assay (FDA approved for NASAL specimens only), is one component of a comprehensive MRSA colonization surveillance program. It is not intended to diagnose MRSA infection nor to guide or monitor treatment for MRSA infections.      Labs: Basic Metabolic Panel:  Recent Labs Lab 08/23/14 1848 08/24/14 0533  NA 139 136  K 4.5 4.0  CL 100* 103  CO2 31 30  GLUCOSE 93 95  BUN 30* 22*  CREATININE 1.41* 1.16  CALCIUM 8.6* 8.0*   Liver Function Tests:  Recent Labs Lab 08/24/14 0533  AST 14*  ALT 10*  ALKPHOS 55  BILITOT 0.6  PROT 6.5   ALBUMIN 3.1*   No results for input(s): LIPASE, AMYLASE in the last 168 hours. No results for input(s): AMMONIA in the last 168 hours. CBC:  Recent Labs Lab 08/23/14 1848 08/24/14 0533 08/25/14 0526  WBC 10.0 7.2 9.5  NEUTROABS 6.7  --   --   HGB 9.7* 9.2* 10.2*  HCT 30.4* 28.9* 31.7*  MCV 90.5 91.5 88.8  PLT 273 234 231   Cardiac Enzymes: No results for input(s): CKTOTAL, CKMB, CKMBINDEX, TROPONINI in the last 168 hours. BNP: BNP (last 3 results) No results for input(s): BNP in the last 8760 hours.  ProBNP (last 3 results) No results for input(s): PROBNP in the last 8760 hours.  CBG: No results for input(s): GLUCAP in the last 168 hours.     Signed:  MEMON,JEHANZEB  [facility] 08/25/2014, 12:55 PM

## 2014-08-26 ENCOUNTER — Encounter: Payer: Self-pay | Admitting: Nurse Practitioner

## 2014-08-26 ENCOUNTER — Telehealth: Payer: Self-pay | Admitting: Gastroenterology

## 2014-08-26 LAB — OCCULT BLOOD, POC DEVICE: Fecal Occult Bld: POSITIVE — AB

## 2014-08-26 NOTE — Telephone Encounter (Signed)
Thanks

## 2014-08-26 NOTE — Telephone Encounter (Signed)
OV cancelled and rescheduled to 7/20 at 0830 with EG. Appt letter mailed

## 2014-08-26 NOTE — Telephone Encounter (Signed)
Patient was hospitalized and recently discharged. Has appt with Randall Hiss on 6/21. Please cancel this and reschedule for about a month out. Thanks!  Vicente Males

## 2014-08-27 ENCOUNTER — Ambulatory Visit: Payer: Medicare PPO | Admitting: Nurse Practitioner

## 2014-09-02 ENCOUNTER — Encounter (HOSPITAL_COMMUNITY): Payer: Self-pay | Admitting: Internal Medicine

## 2014-09-12 ENCOUNTER — Encounter: Payer: Medicare PPO | Admitting: Adult Health

## 2014-09-12 NOTE — Progress Notes (Signed)
Cardiology Office Note   Date:  09/12/2014   ID:  Ryan, Shea July 04, 1945, MRN [id number]  PCP:  Bronson Curb, PA-C  Cardiologist:  Woodroe Chen, NP   ERROR NO SHOW    Signed, Jory Sims, NP  09/12/2014 7:45 AM    [facility] [address] [place], [place] [address] Phone: [phone]; Fax: [phone]

## 2014-09-25 ENCOUNTER — Ambulatory Visit: Payer: Medicare PPO | Admitting: Nurse Practitioner

## 2014-09-25 ENCOUNTER — Telehealth: Payer: Self-pay | Admitting: Gastroenterology

## 2014-09-25 ENCOUNTER — Encounter: Payer: Self-pay | Admitting: Nurse Practitioner

## 2014-09-25 NOTE — Telephone Encounter (Signed)
Noted  

## 2014-09-25 NOTE — Telephone Encounter (Signed)
PATIENT WAS A NO SHOW AND LETTER SENT  °

## 2014-10-08 ENCOUNTER — Encounter: Payer: Self-pay | Admitting: Gastroenterology

## 2014-11-21 ENCOUNTER — Ambulatory Visit (INDEPENDENT_AMBULATORY_CARE_PROVIDER_SITE_OTHER): Payer: Medicare PPO | Admitting: Adult Health

## 2014-11-21 ENCOUNTER — Encounter: Payer: Self-pay | Admitting: Adult Health

## 2014-11-21 VITALS — BP 126/68 | HR 90 | Ht 66.0 in | Wt 234.9 lb

## 2014-11-21 DIAGNOSIS — I483 Typical atrial flutter: Secondary | ICD-10-CM

## 2014-11-21 DIAGNOSIS — I350 Nonrheumatic aortic (valve) stenosis: Secondary | ICD-10-CM

## 2014-11-21 DIAGNOSIS — I10 Essential (primary) hypertension: Secondary | ICD-10-CM

## 2014-11-21 NOTE — Progress Notes (Signed)
Cardiology Office Note   Date:  11/21/2014   ID:  Ryan, Shea August 06, 1945, MRN [id number]  PCP:  Ryan Curb, PA-C  Cardiologist: Ryan Chen, NP   Chief Complaint  Patient presents with  . Atrial Flutter  . Hypertension      History of Present Illness: Ryan Shea is a 69 y.o. male who presents for for ongoing assessment and management and outpatient followup after admission for GI bleed, with history of hypertension, atrial flutter, on anticoagulation therapy with Coumadin, moderate aortic valve stenosis, and mild mitral regurgitation, along with hypertension. He is recently admitted on 05/2014 had upper and GI bleed secondary to duodenal ulcers seen on EGD, continued on PPI and followed by Dr. Oneida Shea. Rate was controlled, blood pressure was well controlled on last office visit.  He comes today without cardiac complaint. He is being followed by [facility] and is unhappy with their care. He is confused on his coumadin dosing and is taking 3 mg daily and 7 mg QOD, due to instructions per their office. He states before the change in dosing his INR ws 2.6.   Past Medical History  Diagnosis Date  . Asthma   . Hypertension   . COPD (chronic obstructive pulmonary disease)   . Cancer     Prostate   . Helicobacter pylori gastritis MAR 2016  . Multiple duodenal ulcers MAR 2016    Past Surgical History  Procedure Laterality Date  . Prostate surgery    . Hernia repair    . Esophagogastroduodenoscopy N/A 05/31/2014    H PYLORI GASTRITIS, DUODENAL ULCERS  . Colonoscopy    . Colonoscopy N/A 08/25/2014    Procedure: COLONOSCOPY;  Surgeon: Ryan Houston, MD;  Location: [facility];  Service: Endoscopy;  Laterality: N/A;     Current Outpatient Prescriptions  Medication Sig Dispense Refill  . carvedilol (COREG) 6.25 MG tablet Take 1 tablet (6.25 mg total) by mouth 2 (two) times daily with a meal. 180 tablet 3  . ferrous sulfate  325 (65 FE) MG tablet Take 325 mg by mouth daily with breakfast.    . Fluticasone-Salmeterol (ADVAIR DISKUS) 250-50 MCG/DOSE AEPB Inhale 1 puff into the lungs daily. 60 each 1  . furosemide (LASIX) 40 MG tablet Take 40 mg by mouth daily.     Marland Kitchen gabapentin (NEURONTIN) 300 MG capsule Take 300 mg by mouth 3 (three) times daily as needed (for pain/neuropathy).     Marland Kitchen lisinopril (PRINIVIL,ZESTRIL) 10 MG tablet Take 10 mg by mouth daily.    . pantoprazole (PROTONIX) 40 MG tablet Take 1 tablet (40 mg total) by mouth daily. 60 tablet 5  . pravastatin (PRAVACHOL) 40 MG tablet Take 1 tablet by mouth at bedtime.     . VENTOLIN HFA 108 (90 BASE) MCG/ACT inhaler Inhale 1 puff into the lungs every 6 (six) hours as needed for wheezing or shortness of breath.     . warfarin (COUMADIN) 3 MG tablet Take 1 tablet (3 mg total) by mouth See admin instructions. Take 4mg  on Mondays and Tuesdays.Take 3mg  on all other days. Restart on 08/27/14    . warfarin (COUMADIN) 4 MG tablet Take 1 tablet (4 mg total) by mouth 2 (two) times a week. Take 4mg  on Mondays and Tuesdays.Take 3mg  on all other days. Restart on 08/27/14    . zolpidem (AMBIEN) 5 MG tablet Take 5 mg by mouth at bedtime.      No current facility-administered medications for this  visit.    Allergies:   Review of patient's allergies indicates no known allergies.    Social History:  The patient  reports that he quit smoking about 36 years ago. His smoking use included Cigarettes. He started smoking about 55 years ago. He has a 40 pack-year smoking history. He has quit using smokeless tobacco. He reports that he does not drink alcohol or use illicit drugs.   Family History:  The patient's family history includes Cancer in his brother and other; Stroke in his mother. There is no history of Colon cancer or Colon polyps.    ROS: All other systems are reviewed and negative. Unless otherwise mentioned in H&P    PHYSICAL EXAM: VS:  BP 126/68 mmHg  Pulse 90  Ht 5\' 6"   (1.676 m)  Wt 234 lb 14.4 oz (106.55 kg)  BMI 37.93 kg/m2  SpO2 94% , BMI Body mass index is 37.93 kg/(m^2). GEN: Well nourished, well developed, in no acute distress HEENT: normal Neck: no JVD, carotid bruits, or masses Cardiac: RRR; 1/6 systolic murmurs, rubs, or gallops,no edema  Respiratory:  clear to auscultation bilaterally, normal work of breathing GI: soft, nontender, nondistended, + BS MS: no deformity or atrophy Skin: warm and dry, no rash Neuro:  Strength and sensation are intact Psych: euthymic mood, full affect  Recent Labs: 08/24/2014: ALT 10*; BUN 22*; Creatinine, Ser 1.16; Potassium 4.0; Sodium 136 08/25/2014: Hemoglobin 10.2*; Platelets 231    Lipid Panel No results found for: CHOL, TRIG, HDL, CHOLHDL, VLDL, LDLCALC, LDLDIRECT    Wt Readings from Last 3 Encounters:  11/21/14 234 lb 14.4 oz (106.55 kg)  08/25/14 230 lb 11.2 oz (104.645 kg)  07/11/14 233 lb 3.2 oz (105.779 kg)       ASSESSMENT AND PLAN:  1. Atrial flutter: He remains on coumadin but is not taking the dose as written down instructions. He questions this as he was told to take higher dose from recent INR check. I have asked him to go by and have his INR checked today and to verify the dose with his PCP. Will keep him on coreg dose. He is a little tachycardic. He may need CBC checked if INR is elevated.   2. AoV stenosis: Will continue to monitor. Last echo was completed in 04/2013 and revealed Moderate aortic valve stenosis. Mildly calcified annulus. Moderately thickened, moderately calcified leaflets. Mild regurgitation.  3. Hypertension: BP is controlled currently on carvedilol and lisinopril No changes.   Current medicines are reviewed at length with the patient today.    No orders of the defined types were placed in this encounter.     Disposition:   FU with Ryan Shea in 6 months.  Signed, Ryan Sims, NP  11/21/2014 3:35 PM    [facility] [address], [place], [place] [address] Phone: [phone]; Fax: [phone]

## 2014-11-21 NOTE — Patient Instructions (Signed)
Your physician wants you to follow-up in: 6 months with Kathryn Lawrence, NP. You will receive a reminder letter in the mail two months in advance. If you don't receive a letter, please call our office to schedule the follow-up appointment.  Your physician recommends that you continue on your current medications as directed. Please refer to the Current Medication list given to you today.  Thank you for choosing [facility]!   

## 2014-11-21 NOTE — Progress Notes (Deleted)
Name: Ryan Shea    DOB: August 26, 1945  Age: 69 y.o.  MR#: [id number]       PCP:  Bronson Curb, PA-C      Insurance: Payor: [employer] Rossetti MEDICARE / Plan: [employer] MEDICARE CHOICE PPO / Product Type: *No Product type* /   CC:    Chief Complaint  Patient presents with  . Atrial Flutter  . Hypertension    VS Filed Vitals:   11/21/14 1530  BP: 126/68  Pulse: 90  Height: 5\' 6"  (1.676 m)  Weight: 234 lb 14.4 oz (106.55 kg)  SpO2: 94%    Weights Current Weight  11/21/14 234 lb 14.4 oz (106.55 kg)  08/25/14 230 lb 11.2 oz (104.645 kg)  07/11/14 233 lb 3.2 oz (105.779 kg)    Blood Pressure  BP Readings from Last 3 Encounters:  11/21/14 126/68  08/25/14 121/59  07/11/14 105/54     Admit date:  (Not on file) Last encounter with RMR:  06/10/2014   Allergy Review of patient's allergies indicates no known allergies.  Current Outpatient Prescriptions  Medication Sig Dispense Refill  . carvedilol (COREG) 6.25 MG tablet Take 1 tablet (6.25 mg total) by mouth 2 (two) times daily with a meal. 180 tablet 3  . ferrous sulfate 325 (65 FE) MG tablet Take 325 mg by mouth daily with breakfast.    . Fluticasone-Salmeterol (ADVAIR DISKUS) 250-50 MCG/DOSE AEPB Inhale 1 puff into the lungs daily. 60 each 1  . furosemide (LASIX) 40 MG tablet Take 40 mg by mouth daily.     Marland Kitchen gabapentin (NEURONTIN) 300 MG capsule Take 300 mg by mouth 3 (three) times daily as needed (for pain/neuropathy).     Marland Kitchen lisinopril (PRINIVIL,ZESTRIL) 10 MG tablet Take 10 mg by mouth daily.    . pantoprazole (PROTONIX) 40 MG tablet Take 1 tablet (40 mg total) by mouth daily. 60 tablet 5  . pravastatin (PRAVACHOL) 40 MG tablet Take 1 tablet by mouth at bedtime.     . VENTOLIN HFA 108 (90 BASE) MCG/ACT inhaler Inhale 1 puff into the lungs every 6 (six) hours as needed for wheezing or shortness of breath.     . warfarin (COUMADIN) 3 MG tablet Take 1 tablet (3 mg total) by mouth See admin instructions. Take 4mg  on Mondays and  Tuesdays.Take 3mg  on all other days. Restart on 08/27/14    . warfarin (COUMADIN) 4 MG tablet Take 1 tablet (4 mg total) by mouth 2 (two) times a week. Take 4mg  on Mondays and Tuesdays.Take 3mg  on all other days. Restart on 08/27/14    . zolpidem (AMBIEN) 5 MG tablet Take 5 mg by mouth at bedtime.      No current facility-administered medications for this visit.    Discontinued Meds:    Medications Discontinued During This Encounter  Medication Reason  . lisinopril (PRINIVIL,ZESTRIL) 20 MG tablet Error    Patient Active Problem List   Diagnosis Date Noted  . Acute renal failure 08/24/2014  . Chronic anticoagulation 08/24/2014  . GI bleed 08/23/2014  . Anemia 08/23/2014  . Rectal bleeding 08/23/2014  . Colon cancer screening 07/11/2014  . Multiple duodenal ulcers 05/30/2014  . Aortic stenosis 04/23/2013  . Mitral regurgitation 04/23/2013  . Cardiomyopathy 04/23/2013  . Atrial flutter 04/23/2013  . HTN (hypertension) 04/20/2013  . High cholesterol 04/20/2013  . COPD (chronic obstructive pulmonary disease) 04/20/2013  . Prostate cancer 04/20/2013  . Backache, unspecified 04/20/2013  . Body mass index 40.0-44.9, adult 04/20/2013    LABS  Component Value Date/Time   NA 136 08/24/2014 0533   NA 139 08/23/2014 1848   NA 138 06/12/2014 1550   K 4.0 08/24/2014 0533   K 4.5 08/23/2014 1848   K 4.3 06/12/2014 1550   CL 103 08/24/2014 0533   CL 100* 08/23/2014 1848   CL 103 06/12/2014 1550   CO2 30 08/24/2014 0533   CO2 31 08/23/2014 1848   CO2 28 06/12/2014 1550   GLUCOSE 95 08/24/2014 0533   GLUCOSE 93 08/23/2014 1848   GLUCOSE 129* 06/12/2014 1550   BUN 22* 08/24/2014 0533   BUN 30* 08/23/2014 1848   BUN 36* 06/12/2014 1550   CREATININE 1.16 08/24/2014 0533   CREATININE 1.41* 08/23/2014 1848   CREATININE 1.83* 06/12/2014 1550   CALCIUM 8.0* 08/24/2014 0533   CALCIUM 8.6* 08/23/2014 1848   CALCIUM 8.9 06/12/2014 1550   GFRNONAA >60 08/24/2014 0533   GFRNONAA 49*  08/23/2014 1848   GFRNONAA 36* 06/12/2014 1550   GFRAA >60 08/24/2014 0533   GFRAA 57* 08/23/2014 1848   GFRAA 42* 06/12/2014 1550   CMP     Component Value Date/Time   NA 136 08/24/2014 0533   K 4.0 08/24/2014 0533   CL 103 08/24/2014 0533   CO2 30 08/24/2014 0533   GLUCOSE 95 08/24/2014 0533   BUN 22* 08/24/2014 0533   CREATININE 1.16 08/24/2014 0533   CALCIUM 8.0* 08/24/2014 0533   PROT 6.5 08/24/2014 0533   ALBUMIN 3.1* 08/24/2014 0533   AST 14* 08/24/2014 0533   ALT 10* 08/24/2014 0533   ALKPHOS 55 08/24/2014 0533   BILITOT 0.6 08/24/2014 0533   GFRNONAA >60 08/24/2014 0533   GFRAA >60 08/24/2014 0533       Component Value Date/Time   WBC 9.5 08/25/2014 0526   WBC 7.2 08/24/2014 0533   WBC 10.0 08/23/2014 1848   HGB 10.2* 08/25/2014 0526   HGB 9.2* 08/24/2014 0533   HGB 9.7* 08/23/2014 1848   HCT 31.7* 08/25/2014 0526   HCT 28.9* 08/24/2014 0533   HCT 30.4* 08/23/2014 1848   MCV 88.8 08/25/2014 0526   MCV 91.5 08/24/2014 0533   MCV 90.5 08/23/2014 1848    Lipid Panel  No results found for: CHOL, TRIG, HDL, CHOLHDL, VLDL, LDLCALC, LDLDIRECT  ABG No results found for: PHART, PCO2ART, PO2ART, HCO3, TCO2, ACIDBASEDEF, O2SAT   No results found for: TSH BNP (last 3 results) No results for input(s): BNP in the last 8760 hours.  ProBNP (last 3 results) No results for input(s): PROBNP in the last 8760 hours.  Cardiac Panel (last 3 results) No results for input(s): CKTOTAL, CKMB, TROPONINI, RELINDX in the last 72 hours.  Iron/TIBC/Ferritin/ %Sat No results found for: IRON, TIBC, FERRITIN, IRONPCTSAT   EKG Orders placed or performed during the hospital encounter of 08/23/14  . ED EKG  . ED EKG  . EKG 12-Lead  . EKG 12-Lead  . EKG     Prior Assessment and Plan Problem List as of 11/21/2014      Cardiovascular and Mediastinum   HTN (hypertension)   Aortic stenosis   Mitral regurgitation   Cardiomyopathy   Atrial flutter     Respiratory   COPD  (chronic obstructive pulmonary disease)     Digestive   Multiple duodenal ulcers   Last Assessment & Plan 07/11/2014 Office Visit Edited 07/11/2014  9:51 AM by Danie Binder, MD    UGIB due to INR > 6 and duodenal ulcers/H PYLORI GASTRITIS. NO BRBPR OR MELENA. LAST  Hb 9.11 Jun 2014.  TREAT H PYLORI ABP BID FOR 10 DAYS. MED SIDE EFFECTS DISCUSSED. NEEDS CLOSE MONITORING OF COUMADIN WHILE ON ABX. REDUCE COUMADIN. CHECK INR 3 DAYS AFTER STARTING ABX. DISCUSSED WITH PHARMACY. PROTONIX BID FOR 3 MOS THEN ONCE DAILY FOR 3 MOS THEN WILL CONSIDER STOPPING MEDS. FOLLOW UP IN 4 MOS.  READDRESS NEED FOR PROTONIX. NEEDS CBC AFTER NEXT VISIT.       GI bleed   Rectal bleeding     Genitourinary   Prostate cancer   Acute renal failure     Other   High cholesterol   Backache, unspecified   Body mass index 40.0-44.9, adult   Colon cancer screening   Last Assessment & Plan 07/11/2014 Office Visit Written 07/11/2014  9:44 AM by Danie Binder, MD    PT REPORTS TCS IN PAST 10 YRS-AVERAGE RISK.  WILL OBTAIN REPORT.      Anemia   Chronic anticoagulation       Imaging: No results found.

## 2014-12-06 ENCOUNTER — Emergency Department (HOSPITAL_COMMUNITY)
Admission: EM | Admit: 2014-12-06 | Discharge: 2014-12-06 | Disposition: A | Discharge status: Home / Self Care | Payer: Medicare PPO | Attending: Emergency Medicine | Admitting: Emergency Medicine

## 2014-12-06 ENCOUNTER — Encounter (HOSPITAL_COMMUNITY): Payer: Self-pay

## 2014-12-06 DIAGNOSIS — Z8619 Personal history of other infectious and parasitic diseases: Secondary | ICD-10-CM | POA: Insufficient documentation

## 2014-12-06 DIAGNOSIS — Z7901 Long term (current) use of anticoagulants: Secondary | ICD-10-CM | POA: Diagnosis not present

## 2014-12-06 DIAGNOSIS — Z87891 Personal history of nicotine dependence: Secondary | ICD-10-CM | POA: Insufficient documentation

## 2014-12-06 DIAGNOSIS — Z8719 Personal history of other diseases of the digestive system: Secondary | ICD-10-CM | POA: Diagnosis not present

## 2014-12-06 DIAGNOSIS — Z79899 Other long term (current) drug therapy: Secondary | ICD-10-CM | POA: Diagnosis not present

## 2014-12-06 DIAGNOSIS — M7989 Other specified soft tissue disorders: Secondary | ICD-10-CM | POA: Diagnosis present

## 2014-12-06 DIAGNOSIS — J449 Chronic obstructive pulmonary disease, unspecified: Secondary | ICD-10-CM | POA: Insufficient documentation

## 2014-12-06 DIAGNOSIS — R000 Tachycardia, unspecified: Secondary | ICD-10-CM | POA: Diagnosis not present

## 2014-12-06 DIAGNOSIS — M10032 Idiopathic gout, left wrist: Secondary | ICD-10-CM | POA: Diagnosis not present

## 2014-12-06 DIAGNOSIS — I10 Essential (primary) hypertension: Secondary | ICD-10-CM | POA: Diagnosis not present

## 2014-12-06 DIAGNOSIS — Z8546 Personal history of malignant neoplasm of prostate: Secondary | ICD-10-CM | POA: Insufficient documentation

## 2014-12-06 DIAGNOSIS — M109 Gout, unspecified: Secondary | ICD-10-CM

## 2014-12-06 LAB — URIC ACID: Uric Acid, Serum: 9.5 mg/dL — ABNORMAL HIGH (ref 4.4–7.6)

## 2014-12-06 MED ORDER — KETOROLAC TROMETHAMINE 60 MG/2ML IM SOLN
60.0000 mg | Freq: Once | INTRAMUSCULAR | Status: AC
  Administered 2014-12-06: 60 mg via INTRAMUSCULAR
  Filled 2014-12-06: qty 2

## 2014-12-06 MED ORDER — COLCHICINE 0.6 MG PO TABS
0.6000 mg | ORAL_TABLET | Freq: Once | ORAL | Status: AC
  Administered 2014-12-06: 0.6 mg via ORAL
  Filled 2014-12-06: qty 1

## 2014-12-06 MED ORDER — COLCHICINE 0.6 MG PO TABS: 0.6000 mg | ORAL_TABLET | Freq: Two times a day (BID) | ORAL | Status: DC

## 2014-12-06 NOTE — ED Notes (Signed)
Pt reports swelling to left forearm since yesterday

## 2014-12-06 NOTE — ED Provider Notes (Signed)
CSN: [id number]     Arrival date & time 12/06/14  0522 History   First MD Initiated Contact with Patient 12/06/14 0535    Chief Complaint  Patient presents with  . Arm Swelling     (Consider location/radiation/quality/duration/timing/severity/associated sxs/prior Treatment) HPI patient reports he started having pain in his left wrist yesterday morning. He denies any known injury or change in activity. He states he had something similar 2 months ago and was told he had gout. He denies any fever. He denies numbness or tingling of his fingers. He states he's never had blood work or an x-ray done of his wrist. We discussed acute gout does not show up on initial x-ray. He states he is right-handed. Patient is on Coumadin and states his last INR was 2.7.  PCP [facility]  Past Medical History  Diagnosis Date  . Asthma   . Hypertension   . COPD (chronic obstructive pulmonary disease)   . Cancer     Prostate   . Helicobacter pylori gastritis MAR 2016  . Multiple duodenal ulcers MAR 2016   Past Surgical History  Procedure Laterality Date  . Prostate surgery    . Hernia repair    . Esophagogastroduodenoscopy N/A 05/31/2014    H PYLORI GASTRITIS, DUODENAL ULCERS  . Colonoscopy    . Colonoscopy N/A 08/25/2014    Procedure: COLONOSCOPY;  Surgeon: Rogene Houston, MD;  Location: [facility];  Service: Endoscopy;  Laterality: N/A;   Family History  Problem Relation Age of Onset  . Stroke Mother   . Cancer Brother   . Cancer Other   . Colon cancer Neg Hx   . Colon polyps Neg Hx    Social History  Substance Use Topics  . Smoking status: Former Smoker -- 2.00 packs/day for 20 years    Types: Cigarettes    Start date: 09/01/1959    Quit date: 09/01/1978  . Smokeless tobacco: Former Systems developer     Comment: quit 30 years ago  . Alcohol Use: No  lives at home  Review of Systems  All other systems reviewed and are negative.     Allergies  Review of  patient's allergies indicates no known allergies.  Home Medications   Prior to Admission medications   Medication Sig Start Date End Date Taking? Authorizing Kameela Leipold  carvedilol (COREG) 6.25 MG tablet Take 1 tablet (6.25 mg total) by mouth 2 (two) times daily with a meal. 05/23/13   [name], MD  colchicine 0.6 MG tablet Take 1 tablet (0.6 mg total) by mouth 2 (two) times daily. 12/06/14   Rolland Porter, MD  ferrous sulfate 325 (65 FE) MG tablet Take 325 mg by mouth daily with breakfast.    Historical Natina Wiginton, MD  Fluticasone-Salmeterol (ADVAIR DISKUS) 250-50 MCG/DOSE AEPB Inhale 1 puff into the lungs daily. 08/25/14   Kathie Dike, MD  furosemide (LASIX) 40 MG tablet Take 40 mg by mouth daily.  02/13/14   Historical Maycen Degregory, MD  gabapentin (NEURONTIN) 300 MG capsule Take 300 mg by mouth 3 (three) times daily as needed (for pain/neuropathy).  02/23/14   Historical Tyrez Berrios, MD  lisinopril (PRINIVIL,ZESTRIL) 10 MG tablet Take 10 mg by mouth daily.    Historical Berlinda Farve, MD  pantoprazole (PROTONIX) 40 MG tablet Take 1 tablet (40 mg total) by mouth daily. 08/25/14   Kathie Dike, MD  pravastatin (PRAVACHOL) 40 MG tablet Take 1 tablet by mouth at bedtime.  07/06/13   Historical Keyra Virella, MD  [place] Cutter  HFA 108 (90 BASE) MCG/ACT inhaler Inhale 1 puff into the lungs every 6 (six) hours as needed for wheezing or shortness of breath.  05/10/14   Historical Elizabelle Fite, MD  warfarin (COUMADIN) 3 MG tablet Take 1 tablet (3 mg total) by mouth See admin instructions. Take 4mg  on Mondays and Tuesdays.Take 3mg  on all other days. Restart on 08/27/14 08/25/14   Kathie Dike, MD  warfarin (COUMADIN) 4 MG tablet Take 1 tablet (4 mg total) by mouth 2 (two) times a week. Take 4mg  on Mondays and Tuesdays.Take 3mg  on all other days. Restart on 08/27/14 08/25/14   Kathie Dike, MD  zolpidem (AMBIEN) 5 MG tablet Take 5 mg by mouth at bedtime.  06/25/14   Historical Callen Zuba, MD   BP 135/69 mmHg  Pulse 107  Temp(Src)  98.4 F (36.9 C)  Resp 22  Ht 5\' 6"  (1.676 m)  Wt 235 lb (106.595 kg)  BMI 37.95 kg/m2  SpO2 93%  Vital signs normal except for tachycardia  Physical Exam  Constitutional: He is oriented to person, place, and time. He appears well-developed and well-nourished.  Non-toxic appearance. He does not appear ill. No distress.  HENT:  Head: Normocephalic and atraumatic.  Right Ear: External ear normal.  Left Ear: External ear normal.  Nose: Nose normal. No mucosal edema or rhinorrhea.  Mouth/Throat: Mucous membranes are normal. No dental abscesses or uvula swelling.  Eyes: Conjunctivae and EOM are normal.  Neck: Normal range of motion and full passive range of motion without pain.  Cardiovascular: Exam reveals no gallop and no friction rub.   No murmur heard. Pulmonary/Chest: Effort normal. No respiratory distress. He has no rhonchi. He exhibits no crepitus.  Abdominal: Normal appearance.  Musculoskeletal: Normal range of motion. He exhibits edema and tenderness.  Moves all extremities well except for his left upper extremity. Patient's noted to have diffuse swelling of his left wrist with diffuse tenderness. The wrist is mildly warm to touch. He has good distal pulses. He has pain on extension and flexion and cannot supinate his wrist because of pain.  Neurological: He is alert and oriented to person, place, and time. He has normal strength. No cranial nerve deficit.  Skin: Skin is warm, dry and intact. No rash noted. No erythema. No pallor.  Psychiatric: He has a normal mood and affect. His speech is normal and behavior is normal. His mood appears not anxious.  Nursing note and vitals reviewed.   ED Course  Procedures (including critical care time)  Medications  ketorolac (TORADOL) injection 60 mg (60 mg Intramuscular Given 12/06/14 0547)  colchicine tablet 0.6 mg (0.6 mg Oral Given 12/06/14 0617)   Patient is on Coumadin. He was given a one-time dose of Toradol to see if that would  help his pain. On review of his prior labs shows his kidney function had been normal before.  Recheck at 6:45 AM patient states his pain is much better. He is able to have some improved range of motion of his wrist that he could not do before. Discussed staying on the colchicine for the acute attack. He needs to discuss with his primary care doctor starting allopurinol once his symptoms are improved to lower his total uric acid level. He also was given dietary instructions for foods to avoid which can exacerbate a acute gouty attack.  Labs Review Results for orders placed or performed during the hospital encounter of 12/06/14  Uric acid  Result Value Ref Range   Uric Acid, Serum 9.5 (H)  4.4 - 7.6 mg/dL   Laboratory interpretation all normal except elevated uric acid consistent with acute gout     Imaging Review No results found. I have personally reviewed and evaluated these images and lab results as part of my medical decision-making.   EKG Interpretation None      MDM   Final diagnoses:  Acute gout of left wrist, unspecified cause   New Prescriptions   COLCHICINE 0.6 MG TABLET    Take 1 tablet (0.6 mg total) by mouth 2 (two) times daily.    Plan discharge  Rolland Porter, MD, Barbette Or, MD 12/06/14 [id number]

## 2014-12-06 NOTE — Discharge Instructions (Signed)
Your uric acid level today was 9.5 which is above normal. Take the colchicine twice a day for the acute gout pain. You need to discuss starting allopurinol to help lower your blood uric acid level, but if you start it while having an acute attack, it can make your symptoms worse. Try heat for comfort. Look at the low purine diet to avoid foods that can trigger a gout attack.    Gout Gout is when your joints become red, sore, and swell (inflamed). This is caused by the buildup of uric acid crystals in the joints. Uric acid is a chemical that is normally in the blood. If the level of uric acid gets too high in the blood, these crystals form in your joints and tissues. Over time, these crystals can form into masses near the joints and tissues. These masses can destroy bone and cause the bone to look misshapen (deformed). HOME CARE   Do not take aspirin for pain.  Only take medicine as told by your doctor.  Rest the joint as much as you can. When in bed, keep sheets and blankets off painful areas.  Keep the sore joints raised (elevated).  Put warm or cold packs on painful joints. Use of warm or cold packs depends on which works best for you.  Use crutches if the painful joint is in your leg.  Drink enough fluids to keep your pee (urine) clear or pale yellow. Limit alcohol, sugary drinks, and drinks with fructose in them.  Follow your diet instructions. Pay careful attention to how much protein you eat. Include fruits, vegetables, whole grains, and fat-free or low-fat milk products in your daily diet. Talk to your doctor or dietitian about the use of coffee, vitamin C, and cherries. These may help lower uric acid levels.  Keep a healthy body weight. GET HELP RIGHT AWAY IF:   You have watery poop (diarrhea), throw up (vomit), or have any side effects from medicines.  You do not feel better in 24 hours, or you are getting worse.  Your joint becomes suddenly more tender, and you have chills or  a fever. MAKE SURE YOU:   Understand these instructions.  Will watch your condition.  Will get help right away if you are not doing well or get worse. Document Released: 12/02/2007 Document Revised: 07/09/2013 Document Reviewed: 10/06/2011 [facility] Patient Information 2015 [place], [place]. This information is not intended to replace advice given to you by your health care provider. Make sure you discuss any questions you have with your health care provider.  Low-Purine Diet Purines are compounds that affect the level of uric acid in your body. A low-purine diet is a diet that is low in purines. Eating a low-purine diet can prevent the level of uric acid in your body from getting too high and causing gout or kidney stones or both. WHAT DO I NEED TO KNOW ABOUT THIS DIET?  Choose low-purine foods. Examples of low-purine foods are listed in the next section.  Drink plenty of fluids, especially water. Fluids can help remove uric acid from your body. Try to drink 8-16 cups (1.9-3.8 L) a day.  Limit foods high in fat, especially saturated fat, as fat makes it harder for the body to get rid of uric acid. Foods high in saturated fat include pizza, cheese, ice cream, whole milk, fried foods, and gravies. Choose foods that are lower in fat and lean sources of protein. Use olive oil when cooking as it contains healthy fats that  are not high in saturated fat.  Limit alcohol. Alcohol interferes with the elimination of uric acid from your body. If you are having a gout attack, avoid all alcohol.  Keep in mind that different people's bodies react differently to different foods. You will probably learn over time which foods do or do not affect you. If you discover that a food tends to cause your gout to flare up, avoid eating that food. You can more freely enjoy foods that do not cause problems. If you have any questions about a food item, talk to your dietitian or health care provider. WHICH FOODS ARE LOW,  MODERATE, AND HIGH IN PURINES? The following is a list of foods that are low, moderate, and high in purines. You can eat any amount of the foods that are low in purines. You may be able to have small amounts of foods that are moderate in purines. Ask your health care provider how much of a food moderate in purines you can have. Avoid foods high in purines. Grains  Foods low in purines: Enriched white bread, pasta, rice, cake, cornbread, popcorn.  Foods moderate in purines: Whole-grain breads and cereals, wheat germ, bran, oatmeal. Uncooked oatmeal. Dry wheat bran or wheat germ.  Foods high in purines: Pancakes, [place] toast, biscuits, muffins. Vegetables  Foods low in purines: All vegetables, except those that are moderate in purines.  Foods moderate in purines: Asparagus, cauliflower, spinach, mushrooms, green peas. Fruits  All fruits are low in purines. Meats and other Protein Foods  Foods low in purines: Eggs, nuts, peanut butter.  Foods moderate in purines: 80-90% lean beef, lamb, veal, pork, poultry, fish, eggs, peanut butter, nuts. Crab, lobster, oysters, and shrimp. Cooked dried beans, peas, and lentils.  Foods high in purines: Anchovies, sardines, herring, mussels, tuna, codfish, scallops, trout, and haddock. Ryan Shea. Organ meats (such as liver or kidney). [place]. Game meat. Goose. Sweetbreads. Dairy  All dairy foods are low in purines. Low-fat and fat-free dairy products are best because they are low in saturated fat. Beverages  Drinks low in purines: Water, carbonated beverages, tea, coffee, cocoa.  Drinks moderate in purines: Soft drinks and other drinks sweetened with high-fructose corn syrup. Juices. To find whether a food or drink is sweetened with high-fructose corn syrup, look at the ingredients list.  Drinks high in purines: Alcoholic beverages (such as beer). Condiments  Foods low in purines: Salt, herbs, olives, pickles, relishes, vinegar.  Foods moderate in  purines: Butter, margarine, oils, mayonnaise. Fats and Oils  Foods low in purines: All types, except gravies and sauces made with meat.  Foods high in purines: Gravies and sauces made with meat. Other Foods  Foods low in purines: Sugars, sweets, gelatin. Cake. Soups made without meat.  Foods moderate in purines: Meat-based or fish-based soups, broths, or bouillons. Foods and drinks sweetened with high-fructose corn syrup.  Foods high in purines: High-fat desserts (such as ice cream, cookies, cakes, pies, doughnuts, and chocolate). Contact your dietitian for more information on foods that are not listed here. Document Released: 06/19/2010 Document Revised: 02/27/2013 Document Reviewed: 01/29/2013 [facility] Patient Information 2015 [place], [place]. This information is not intended to replace advice given to you by your health care provider. Make sure you discuss any questions you have with your health care provider.

## 2014-12-06 NOTE — ED Notes (Signed)
   12/06/14 0552  Musculoskeletal  Musculoskeletal (WDL) X  LUE Limited movement;Swelling  pt reports left hand and wrist swelling since yesterday. Denies any injury. Dx w/ gout a few months ago.

## 2015-03-02 ENCOUNTER — Encounter (HOSPITAL_COMMUNITY): Payer: Self-pay

## 2015-03-02 ENCOUNTER — Inpatient Hospital Stay (HOSPITAL_COMMUNITY)
Admission: EM | Admit: 2015-03-02 | Discharge: 2015-03-03 | DRG: 190 | Disposition: A | Discharge status: Home / Self Care | Payer: Medicare PPO | Attending: Internal Medicine | Admitting: Internal Medicine

## 2015-03-02 ENCOUNTER — Emergency Department (HOSPITAL_COMMUNITY): Payer: Medicare PPO

## 2015-03-02 DIAGNOSIS — E7800 Pure hypercholesterolemia, unspecified: Secondary | ICD-10-CM | POA: Diagnosis not present

## 2015-03-02 DIAGNOSIS — I4892 Unspecified atrial flutter: Secondary | ICD-10-CM | POA: Diagnosis present

## 2015-03-02 DIAGNOSIS — I10 Essential (primary) hypertension: Secondary | ICD-10-CM | POA: Diagnosis present

## 2015-03-02 DIAGNOSIS — I483 Typical atrial flutter: Secondary | ICD-10-CM | POA: Diagnosis not present

## 2015-03-02 DIAGNOSIS — J441 Chronic obstructive pulmonary disease with (acute) exacerbation: Principal | ICD-10-CM | POA: Diagnosis present

## 2015-03-02 DIAGNOSIS — Z809 Family history of malignant neoplasm, unspecified: Secondary | ICD-10-CM | POA: Diagnosis not present

## 2015-03-02 DIAGNOSIS — Z87891 Personal history of nicotine dependence: Secondary | ICD-10-CM

## 2015-03-02 DIAGNOSIS — R0602 Shortness of breath: Secondary | ICD-10-CM | POA: Diagnosis present

## 2015-03-02 DIAGNOSIS — J9601 Acute respiratory failure with hypoxia: Secondary | ICD-10-CM | POA: Diagnosis present

## 2015-03-02 DIAGNOSIS — Z823 Family history of stroke: Secondary | ICD-10-CM | POA: Diagnosis not present

## 2015-03-02 DIAGNOSIS — J45909 Unspecified asthma, uncomplicated: Secondary | ICD-10-CM | POA: Diagnosis present

## 2015-03-02 DIAGNOSIS — Z7901 Long term (current) use of anticoagulants: Secondary | ICD-10-CM | POA: Diagnosis not present

## 2015-03-02 DIAGNOSIS — D649 Anemia, unspecified: Secondary | ICD-10-CM

## 2015-03-02 DIAGNOSIS — E785 Hyperlipidemia, unspecified: Secondary | ICD-10-CM | POA: Diagnosis present

## 2015-03-02 DIAGNOSIS — D72829 Elevated white blood cell count, unspecified: Secondary | ICD-10-CM

## 2015-03-02 DIAGNOSIS — N289 Disorder of kidney and ureter, unspecified: Secondary | ICD-10-CM

## 2015-03-02 HISTORY — DX: Gout, unspecified: M10.9

## 2015-03-02 HISTORY — DX: Nonrheumatic mitral (valve) insufficiency: I34.0

## 2015-03-02 HISTORY — DX: Unspecified atrial flutter: I48.92

## 2015-03-02 LAB — CBC WITH DIFFERENTIAL/PLATELET
Basophils Absolute: 0 10*3/uL (ref 0.0–0.1)
Basophils Relative: 0 %
EOS PCT: 0 %
Eosinophils Absolute: 0 10*3/uL (ref 0.0–0.7)
HCT: 33.6 % — ABNORMAL LOW (ref 39.0–52.0)
HEMOGLOBIN: 11.1 g/dL — AB (ref 13.0–17.0)
LYMPHS ABS: 2.5 10*3/uL (ref 0.7–4.0)
LYMPHS PCT: 11 %
MCH: 30.2 pg (ref 26.0–34.0)
MCHC: 33 g/dL (ref 30.0–36.0)
MCV: 91.3 fL (ref 78.0–100.0)
MONOS PCT: 9 %
Monocytes Absolute: 1.9 10*3/uL — ABNORMAL HIGH (ref 0.1–1.0)
Neutro Abs: 17.6 10*3/uL — ABNORMAL HIGH (ref 1.7–7.7)
Neutrophils Relative %: 80 %
PLATELETS: 293 10*3/uL (ref 150–400)
RBC: 3.68 MIL/uL — AB (ref 4.22–5.81)
RDW: 13.9 % (ref 11.5–15.5)
WBC: 22.1 10*3/uL — AB (ref 4.0–10.5)

## 2015-03-02 LAB — BASIC METABOLIC PANEL
Anion gap: 7 (ref 5–15)
BUN: 22 mg/dL — AB (ref 6–20)
CHLORIDE: 100 mmol/L — AB (ref 101–111)
CO2: 28 mmol/L (ref 22–32)
Calcium: 8.6 mg/dL — ABNORMAL LOW (ref 8.9–10.3)
Creatinine, Ser: 1.57 mg/dL — ABNORMAL HIGH (ref 0.61–1.24)
GFR calc Af Amer: 50 mL/min — ABNORMAL LOW (ref >60)
GFR calc non Af Amer: 43 mL/min — ABNORMAL LOW (ref >60)
GLUCOSE: 118 mg/dL — AB (ref 65–99)
Potassium: 4.2 mmol/L (ref 3.5–5.1)
Sodium: 135 mmol/L (ref 135–145)

## 2015-03-02 LAB — PROTIME-INR
INR: 1.59 — ABNORMAL HIGH (ref 0.00–1.49)
Prothrombin Time: 19 seconds — ABNORMAL HIGH (ref 11.6–15.2)

## 2015-03-02 LAB — I-STAT CG4 LACTIC ACID, ED: Lactic Acid, Venous: 1.94 mmol/L (ref 0.5–2.0)

## 2015-03-02 MED ORDER — METHYLPREDNISOLONE SODIUM SUCC 125 MG IJ SOLR
125.0000 mg | Freq: Once | INTRAMUSCULAR | Status: AC
  Administered 2015-03-02: 125 mg via INTRAVENOUS
  Filled 2015-03-02: qty 2

## 2015-03-02 MED ORDER — COLCHICINE 0.6 MG PO TABS
0.6000 mg | ORAL_TABLET | Freq: Every day | ORAL | Status: DC
  Administered 2015-03-03: 0.6 mg via ORAL
  Filled 2015-03-02: qty 1

## 2015-03-02 MED ORDER — OXYCODONE HCL 5 MG PO TABS
5.0000 mg | ORAL_TABLET | ORAL | Status: DC | PRN
Start: 2015-03-02 — End: 2015-03-03

## 2015-03-02 MED ORDER — GABAPENTIN 300 MG PO CAPS
300.0000 mg | ORAL_CAPSULE | Freq: Three times a day (TID) | ORAL | Status: DC | PRN
Start: 2015-03-02 — End: 2015-03-03
  Administered 2015-03-03: 300 mg via ORAL
  Filled 2015-03-02: qty 1

## 2015-03-02 MED ORDER — SODIUM CHLORIDE 0.9 % IV SOLN: 250.0000 mL | INTRAVENOUS | Status: DC | PRN

## 2015-03-02 MED ORDER — ALBUTEROL (5 MG/ML) CONTINUOUS INHALATION SOLN
10.0000 mg/h | INHALATION_SOLUTION | Freq: Once | RESPIRATORY_TRACT | Status: AC
  Administered 2015-03-02: 10 mg/h via RESPIRATORY_TRACT
  Filled 2015-03-02: qty 20

## 2015-03-02 MED ORDER — WARFARIN SODIUM 2 MG PO TABS
3.0000 mg | ORAL_TABLET | Freq: Once | ORAL | Status: AC
  Administered 2015-03-02: 3 mg via ORAL
  Filled 2015-03-02 (×2): qty 1

## 2015-03-02 MED ORDER — WARFARIN SODIUM 2 MG PO TABS: 3.0000 mg | ORAL_TABLET | Freq: Every day | ORAL | Status: DC

## 2015-03-02 MED ORDER — ONDANSETRON HCL 4 MG/2ML IJ SOLN: 4.0000 mg | Freq: Four times a day (QID) | INTRAMUSCULAR | Status: DC | PRN

## 2015-03-02 MED ORDER — PANTOPRAZOLE SODIUM 40 MG PO TBEC
40.0000 mg | DELAYED_RELEASE_TABLET | Freq: Every day | ORAL | Status: DC
  Administered 2015-03-03: 40 mg via ORAL
  Filled 2015-03-02: qty 1

## 2015-03-02 MED ORDER — ALBUTEROL SULFATE (2.5 MG/3ML) 0.083% IN NEBU: 2.5000 mg | INHALATION_SOLUTION | RESPIRATORY_TRACT | Status: DC

## 2015-03-02 MED ORDER — HYDROMORPHONE HCL 1 MG/ML IJ SOLN: 0.5000 mg | INTRAMUSCULAR | Status: DC | PRN

## 2015-03-02 MED ORDER — FERROUS SULFATE 325 (65 FE) MG PO TABS
325.0000 mg | ORAL_TABLET | Freq: Every day | ORAL | Status: DC
  Administered 2015-03-02: 325 mg via ORAL
  Filled 2015-03-02: qty 1

## 2015-03-02 MED ORDER — IPRATROPIUM BROMIDE 0.02 % IN SOLN
1.0000 mg | Freq: Once | RESPIRATORY_TRACT | Status: AC
  Administered 2015-03-02: 1 mg via RESPIRATORY_TRACT
  Filled 2015-03-02: qty 5

## 2015-03-02 MED ORDER — ALUM & MAG HYDROXIDE-SIMETH 200-200-20 MG/5ML PO SUSP: 30.0000 mL | Freq: Four times a day (QID) | ORAL | Status: DC | PRN

## 2015-03-02 MED ORDER — IPRATROPIUM-ALBUTEROL 0.5-2.5 (3) MG/3ML IN SOLN: 3.0000 mL | RESPIRATORY_TRACT | Status: DC

## 2015-03-02 MED ORDER — ACETAMINOPHEN 325 MG PO TABS: 650.0000 mg | ORAL_TABLET | Freq: Four times a day (QID) | ORAL | Status: DC | PRN

## 2015-03-02 MED ORDER — ONDANSETRON HCL 4 MG PO TABS: 4.0000 mg | ORAL_TABLET | Freq: Four times a day (QID) | ORAL | Status: DC | PRN

## 2015-03-02 MED ORDER — ACETAMINOPHEN 650 MG RE SUPP: 650.0000 mg | Freq: Four times a day (QID) | RECTAL | Status: DC | PRN

## 2015-03-02 MED ORDER — PRAVASTATIN SODIUM 40 MG PO TABS
40.0000 mg | ORAL_TABLET | Freq: Every day | ORAL | Status: DC
  Administered 2015-03-02: 40 mg via ORAL
  Filled 2015-03-02: qty 1

## 2015-03-02 MED ORDER — LISINOPRIL 5 MG PO TABS
5.0000 mg | ORAL_TABLET | Freq: Every day | ORAL | Status: DC
  Administered 2015-03-03: 5 mg via ORAL
  Filled 2015-03-02: qty 1

## 2015-03-02 MED ORDER — METHYLPREDNISOLONE SODIUM SUCC 125 MG IJ SOLR
125.0000 mg | Freq: Once | INTRAMUSCULAR | Status: AC
  Administered 2015-03-03: 125 mg via INTRAVENOUS
  Filled 2015-03-02: qty 2

## 2015-03-02 MED ORDER — LEVOFLOXACIN IN D5W 750 MG/150ML IV SOLN: 750.0000 mg | INTRAVENOUS | Status: DC

## 2015-03-02 MED ORDER — LEVOFLOXACIN IN D5W 500 MG/100ML IV SOLN
500.0000 mg | INTRAVENOUS | Status: DC
  Administered 2015-03-02: 500 mg via INTRAVENOUS
  Filled 2015-03-02: qty 100

## 2015-03-02 MED ORDER — SODIUM CHLORIDE 0.9 % IJ SOLN
3.0000 mL | Freq: Two times a day (BID) | INTRAMUSCULAR | Status: DC
Start: 2015-03-02 — End: 2015-03-03
  Administered 2015-03-02: 3 mL via INTRAVENOUS

## 2015-03-02 MED ORDER — ZOLPIDEM TARTRATE 5 MG PO TABS
5.0000 mg | ORAL_TABLET | Freq: Every day | ORAL | Status: DC
  Administered 2015-03-02: 5 mg via ORAL
  Filled 2015-03-02: qty 1

## 2015-03-02 MED ORDER — CARVEDILOL 3.125 MG PO TABS
6.2500 mg | ORAL_TABLET | Freq: Two times a day (BID) | ORAL | Status: DC
  Administered 2015-03-03: 6.25 mg via ORAL
  Filled 2015-03-02: qty 2

## 2015-03-02 MED ORDER — GUAIFENESIN 100 MG/5ML PO SOLN
100.0000 mg | Freq: Three times a day (TID) | ORAL | Status: DC | PRN
  Filled 2015-03-02: qty 5

## 2015-03-02 MED ORDER — SODIUM CHLORIDE 0.9 % IJ SOLN: 3.0000 mL | INTRAMUSCULAR | Status: DC | PRN

## 2015-03-02 MED ORDER — WARFARIN - PHARMACIST DOSING INPATIENT: Status: DC

## 2015-03-02 NOTE — Progress Notes (Signed)
ANTIBIOTIC CONSULT NOTE - INITIAL  Pharmacy Consult for Levaquin Indication: COPD Exacerbation  No Known Allergies  Patient Measurements: Height: 5\' 6"  (167.6 cm) Weight: 240 lb (108.863 kg) IBW/kg (Calculated) : 63.8 Adjusted Body Weight:   Vital Signs: Temp: 99 F (37.2 C) (12/25 1813) Temp Source: Oral (12/25 1813) BP: 143/60 mmHg (12/25 1813) Pulse Rate: 108 (12/25 2015) Intake/Output from previous day:   Intake/Output from this shift:    Labs:  Recent Labs  03/02/15 1902  WBC 22.1*  HGB 11.1*  PLT 293  CREATININE 1.57*   Estimated Creatinine Clearance: 50.7 mL/min (by C-G formula based on Cr of 1.57). No results for input(s): VANCOTROUGH, VANCOPEAK, VANCORANDOM, GENTTROUGH, GENTPEAK, GENTRANDOM, TOBRATROUGH, TOBRAPEAK, TOBRARND, AMIKACINPEAK, AMIKACINTROU, AMIKACIN in the last 72 hours.   Microbiology: No results found for this or any previous visit (from the past 720 hour(s)).  Medical History: Past Medical History  Diagnosis Date  . Asthma   . Hypertension   . COPD (chronic obstructive pulmonary disease) ([place])   . Cancer [facility])     Prostate   . Helicobacter pylori gastritis MAR 2016  . Multiple duodenal ulcers MAR 2016  . Atrial flutter ([place])   . Aortic valve stenosis   . Cardiomyopathy ([place])   . Mitral regurgitation   . Gout     Medications:  Scheduled:   Assessment: 69 yo male ED patient, congested, coughing, SOB with exertion. Pharmacy consult Levaquin  Goal of Therapy:  Eradicate infection  Plan:  Levaquin 500 mg IV every 24 hours Monitor renal function Labs per protocol F/U oral therapy when appropriate  Abner Greenspan, Gerardo Territo Bennett 03/02/2015,9:05 PM

## 2015-03-02 NOTE — Progress Notes (Signed)
ANTICOAGULATION CONSULT NOTE - Initial Consult  Pharmacy Consult for Coumadin Indication: atrial fibrillation  No Known Allergies  Patient Measurements: Height: 5\' 6"  (167.6 cm) Weight: 240 lb (108.863 kg) IBW/kg (Calculated) : 63.8 Heparin Dosing Weight:   Vital Signs: Temp: 99 F (37.2 C) (12/25 1813) Temp Source: Oral (12/25 1813) BP: 139/62 mmHg (12/25 2112) Pulse Rate: 101 (12/25 2112)  Labs:  Recent Labs  03/02/15 1902  HGB 11.1*  HCT 33.6*  PLT 293  LABPROT 19.0*  INR 1.59*  CREATININE 1.57*    Estimated Creatinine Clearance: 50.7 mL/min (by C-G formula based on Cr of 1.57).   Medical History: Past Medical History  Diagnosis Date  . Asthma   . Hypertension   . COPD (chronic obstructive pulmonary disease) ([place])   . Cancer [facility])     Prostate   . Helicobacter pylori gastritis MAR 2016  . Multiple duodenal ulcers MAR 2016  . Atrial flutter ([place])   . Aortic valve stenosis   . Cardiomyopathy ([place])   . Mitral regurgitation   . Gout     Medications:   (Not in a hospital admission)  Assessment: Continuation of Coumadin PTA for AFIB INR sub therapeutic on admission  Goal of Therapy:  INR 2-3 Monitor platelets by anticoagulation protocol: Yes   Plan:  Coumadin 3 mg po x 1 dose tonight (home regiment) INR/PT daily Monitor CBC/ Platelets Abner Greenspan, Delona Clasby Bennett 03/02/2015,10:10 PM

## 2015-03-02 NOTE — ED Provider Notes (Signed)
CSN: [id number]     Arrival date & time 03/02/15  1750 History   First MD Initiated Contact with Patient 03/02/15 1820     Chief Complaint  Patient presents with  . Cough  . Wheezing      HPI Pt was seen at 1820.  Per pt, c/o gradual onset and worsening of persistent cough, wheezing and SOB for the past 3 weeks.  Describes the cough as productive of "green" sputum. Has been using home MDI and nebs with transient relief.  Denies CP/palpitations, no back pain, no abd pain, no N/V/D, no fevers, no rash.     Past Medical History  Diagnosis Date  . Asthma   . Hypertension   . COPD (chronic obstructive pulmonary disease) ([place])   . Cancer [facility])     Prostate   . Helicobacter pylori gastritis MAR 2016  . Multiple duodenal ulcers MAR 2016  . Atrial flutter ([place])   . Aortic valve stenosis   . Cardiomyopathy ([place])   . Mitral regurgitation   . Gout    Past Surgical History  Procedure Laterality Date  . Prostate surgery    . Hernia repair    . Esophagogastroduodenoscopy N/A 05/31/2014    H PYLORI GASTRITIS, DUODENAL ULCERS  . Colonoscopy    . Colonoscopy N/A 08/25/2014    Procedure: COLONOSCOPY;  Surgeon: Rogene Houston, MD;  Location: [facility];  Service: Endoscopy;  Laterality: N/A;   Family History  Problem Relation Age of Onset  . Stroke Mother   . Cancer Brother   . Cancer Other   . Colon cancer Neg Hx   . Colon polyps Neg Hx    Social History  Substance Use Topics  . Smoking status: Former Smoker -- 2.00 packs/day for 20 years    Types: Cigarettes    Start date: 09/01/1959    Quit date: 09/01/1978  . Smokeless tobacco: Former Systems developer     Comment: quit 30 years ago  . Alcohol Use: No    Review of Systems ROS: Statement: All systems negative except as marked or noted in the HPI; Constitutional: Negative for fever and chills. ; ; Eyes: Negative for eye pain, redness and discharge. ; ; ENMT: Negative for ear pain, hoarseness, nasal congestion, sinus pressure and sore  throat. ; ; Cardiovascular: Negative for chest pain, palpitations, diaphoresis, and peripheral edema. ; ; Respiratory: +cough, wheezing, SOB. Negative for stridor. ; ; Gastrointestinal: Negative for nausea, vomiting, diarrhea, abdominal pain, blood in stool, hematemesis, jaundice and rectal bleeding. . ; ; Genitourinary: Negative for dysuria, flank pain and hematuria. ; ; Musculoskeletal: Negative for back pain and neck pain. Negative for swelling and trauma.; ; Skin: Negative for pruritus, rash, abrasions, blisters, bruising and skin lesion.; ; Neuro: Negative for headache, lightheadedness and neck stiffness. Negative for weakness, altered level of consciousness , altered mental status, extremity weakness, paresthesias, involuntary movement, seizure and syncope.      Allergies  Review of patient's allergies indicates no known allergies.  Home Medications   Prior to Admission medications   Medication Sig Start Date End Date Taking? Authorizing Provider  carvedilol (COREG) 6.25 MG tablet Take 1 tablet (6.25 mg total) by mouth 2 (two) times daily with a meal. 05/23/13   [name], MD  colchicine 0.6 MG tablet Take 1 tablet (0.6 mg total) by mouth 2 (two) times daily. 12/06/14   Rolland Porter, MD  ferrous sulfate 325 (65 FE) MG tablet Take 325 mg by mouth daily with  breakfast.    Historical Provider, MD  Fluticasone-Salmeterol (ADVAIR DISKUS) 250-50 MCG/DOSE AEPB Inhale 1 puff into the lungs daily. 08/25/14   Kathie Dike, MD  furosemide (LASIX) 40 MG tablet Take 40 mg by mouth daily.  02/13/14   Historical Provider, MD  gabapentin (NEURONTIN) 300 MG capsule Take 300 mg by mouth 3 (three) times daily as needed (for pain/neuropathy).  02/23/14   Historical Provider, MD  lisinopril (PRINIVIL,ZESTRIL) 10 MG tablet Take 10 mg by mouth daily.    Historical Provider, MD  pantoprazole (PROTONIX) 40 MG tablet Take 1 tablet (40 mg total) by mouth daily. 08/25/14   Kathie Dike, MD  pravastatin  (PRAVACHOL) 40 MG tablet Take 1 tablet by mouth at bedtime.  07/06/13   Historical Provider, MD  VENTOLIN HFA 108 (90 BASE) MCG/ACT inhaler Inhale 1 puff into the lungs every 6 (six) hours as needed for wheezing or shortness of breath.  05/10/14   Historical Provider, MD  warfarin (COUMADIN) 3 MG tablet Take 1 tablet (3 mg total) by mouth See admin instructions. Take 4mg  on Mondays and Tuesdays.Take 3mg  on all other days. Restart on 08/27/14 08/25/14   Kathie Dike, MD  warfarin (COUMADIN) 4 MG tablet Take 1 tablet (4 mg total) by mouth 2 (two) times a week. Take 4mg  on Mondays and Tuesdays.Take 3mg  on all other days. Restart on 08/27/14 08/25/14   Kathie Dike, MD  zolpidem (AMBIEN) 5 MG tablet Take 5 mg by mouth at bedtime.  06/25/14   Historical Provider, MD   BP 143/60 mmHg  Pulse 126  Temp(Src) 99 F (37.2 C) (Oral)  Resp 28  Ht 5\' 6"  (1.676 m)  Wt 240 lb (108.863 kg)  BMI 38.76 kg/m2  SpO2 95%   BP 143/60 mmHg  Pulse 108  Temp(Src) 99 F (37.2 C) (Oral)  Resp 28  Ht 5\' 6"  (1.676 m)  Wt 240 lb (108.863 kg)  BMI 38.76 kg/m2  SpO2 92%   Physical Exam  1825: Physical examination:  Nursing notes reviewed; Vital signs and O2 SAT reviewed;  Constitutional: Well developed, Well nourished, Well hydrated, Uncomfortable appearing.; Head:  Normocephalic, atraumatic; Eyes: EOMI, PERRL, No scleral icterus; ENMT: Mouth and pharynx normal, Mucous membranes moist; Neck: Supple, Full range of motion, No lymphadenopathy; Cardiovascular: Tachycardic rate and rhythm, No gallop; Respiratory: Breath sounds diminished & equal bilaterally, scattered wheezes. +occasional audible wheezing. Speaking phrases. Sitting upright. Tachypneic. Normal respiratory effort/excursion; Chest: Nontender, Movement normal; Abdomen: Soft, Nontender, Nondistended, Normal bowel sounds; Genitourinary: No CVA tenderness; Extremities: Pulses normal, No tenderness, No edema, No calf edema or asymmetry.; Neuro: AA&Ox3, Major CN grossly  intact.  Speech clear. No gross focal motor or sensory deficits in extremities.; Skin: Color normal, Warm, Dry.   ED Course  Procedures (including critical care time)  Labs Review  Imaging Review  I have personally reviewed and evaluated these images and lab results as part of my medical decision-making.   EKG Interpretation None      MDM  MDM Reviewed: previous chart, nursing note and vitals Reviewed previous: labs Interpretation: labs and x-ray Total time providing critical care: 30-74 minutes. This excludes time spent performing separately reportable procedures and services. Consults: admitting MD   CRITICAL CARE Performed by: Alfonzo Feller Total critical care time: 35 minutes Critical care time was exclusive of separately billable procedures and treating other patients. Critical care was necessary to treat or prevent imminent or life-threatening deterioration. Critical care was time spent personally by me on the following activities: development  of treatment plan with patient and/or surrogate as well as nursing, discussions with consultants, evaluation of patient's response to treatment, examination of patient, obtaining history from patient or surrogate, ordering and performing treatments and interventions, ordering and review of laboratory studies, ordering and review of radiographic studies, pulse oximetry and re-evaluation of patient's condition.   Results for orders placed or performed during the hospital encounter of 03/02/15  CBC with Differential  Result Value Ref Range   WBC 22.1 (H) 4.0 - 10.5 K/uL   RBC 3.68 (L) 4.22 - 5.81 MIL/uL   Hemoglobin 11.1 (L) 13.0 - 17.0 g/dL   HCT 33.6 (L) 39.0 - 52.0 %   MCV 91.3 78.0 - 100.0 fL   MCH 30.2 26.0 - 34.0 pg   MCHC 33.0 30.0 - 36.0 g/dL   RDW 13.9 11.5 - 15.5 %   Platelets 293 150 - 400 K/uL   Neutrophils Relative % 80 %   Neutro Abs 17.6 (H) 1.7 - 7.7 K/uL   Lymphocytes Relative 11 %   Lymphs Abs 2.5 0.7 -  4.0 K/uL   Monocytes Relative 9 %   Monocytes Absolute 1.9 (H) 0.1 - 1.0 K/uL   Eosinophils Relative 0 %   Eosinophils Absolute 0.0 0.0 - 0.7 K/uL   Basophils Relative 0 %   Basophils Absolute 0.0 0.0 - 0.1 K/uL  Basic metabolic panel  Result Value Ref Range   Sodium 135 135 - 145 mmol/L   Potassium 4.2 3.5 - 5.1 mmol/L   Chloride 100 (L) 101 - 111 mmol/L   CO2 28 22 - 32 mmol/L   Glucose, Bld 118 (H) 65 - 99 mg/dL   BUN 22 (H) 6 - 20 mg/dL   Creatinine, Ser 1.57 (H) 0.61 - 1.24 mg/dL   Calcium 8.6 (L) 8.9 - 10.3 mg/dL   GFR calc non Af Amer 43 (L) >60 mL/min   GFR calc Af Amer 50 (L) >60 mL/min   Anion gap 7 5 - 15  Protime-INR  Result Value Ref Range   Prothrombin Time 19.0 (H) 11.6 - 15.2 seconds   INR 1.59 (H) 0.00 - 1.49  I-Stat CG4 Lactic Acid, ED  Result Value Ref Range   Lactic Acid, Venous 1.94 0.5 - 2.0 mmol/L    Dg Chest 2 View 03/02/2015  CLINICAL DATA:  Short of breath and congestion. EXAM: CHEST  2 VIEW COMPARISON:  01/06/2015 FINDINGS: Normal mediastinum and cardiac silhouette. Normal pulmonary vasculature. No evidence of effusion, infiltrate, or pneumothorax. No acute bony abnormality. Degenerative osteophytosis of the thoracic spine. IMPRESSION: No active cardiopulmonary disease. Electronically Signed   By: Suzy Bouchard M.D.   On: 03/02/2015 19:09    Results for RODNY, KOSAK (MRN [id number]) as of 03/02/2015 20:15  Ref. Range 06/12/2014 15:50 08/23/2014 18:48 08/24/2014 05:33 03/02/2015 19:02  BUN Latest Ref Range: 6-20 mg/dL 36 (H) 30 (H) 22 (H) 22 (H)  Creatinine Latest Ref Range: 0.61-1.24 mg/dL 1.83 (H) 1.41 (H) 1.16 1.57 (H)   Results for NELLY, DEVERS (MRN [id number]) as of 03/02/2015 20:15  Ref. Range 06/12/2014 15:50 08/23/2014 18:48 08/24/2014 05:33 08/25/2014 05:26 03/02/2015 19:02  Hemoglobin Latest Ref Range: 13.0-17.0 g/dL 9.3 (L) 9.7 (L) 9.2 (L) 10.2 (L) 11.1 (L)  HCT Latest Ref Range: 39.0-52.0 % 28.6 (L) 30.4 (L) 28.9 (L) 31.7 (L) 33.6 (L)      2100:  On arrival, pt sitting upright, tachypneic, lungs diminished/wheezing with frequent coughing: hour long neb and IV solumedrol given. Neb nearly completed: lungs  diminished/coarse, HR 90's, Sats will drop to 91% while on neb and moving around on stretcher. After neb completed: Sats 93% R/A sitting, pt climbed off stretcher and stood at bedside with Sats dropping to 88% R/A and pt c/o increasing SOB and RR. O2 2L N/C applied with Sats increasing to 97%. Pt continues to deny CP.  Dx and testing d/w pt and family.  Questions answered.  Verb understanding, agreeable to admit.  T/C to [facility]. Arnoldo Morale, case discussed, including:  HPI, pertinent PM/SHx, VS/PE, dx testing, ED course and treatment:  Agreeable to admit, requests to write temporary orders, obtain medical bed to team [facility].   Francine Graven, DO 03/04/15 2215

## 2015-03-02 NOTE — H&P (Signed)
[facility] Admission History and Physical       [name] [id number] DOB: 03/07/1946 DOA: 03/02/2015  Referring physician: EDP PCP: Bronson Curb, PA-C  Specialists:   Chief Complaint: SOB  HPI: Ryan Shea is a 70 y.o. male with a history of COPD, HTN, Atrial Flutter on Coumadin Rx who presented to the ED with complaints of increased SOB, Cough adn Chest tightness x 3 weeks.   He reports having a productive cough of greenish sputum.  He was found to have decreased O2 sats to 88% in the ED, and was administered IV Solumedrol and Duonebs in the ED and had mild improvement.    He was also given IV Levaquin and referred for admission.      Review of Systems:  Constitutional: No Weight Loss, No Weight Gain, Night Sweats, Fevers, Chills, Dizziness, Light Headedness, Fatigue, or Generalized Weakness HEENT: No Headaches, Difficulty Swallowing,Tooth/Dental Problems,Sore Throat,  No Sneezing, Rhinitis, Ear Ache, Nasal Congestion, or Post Nasal Drip,  Cardio-vascular:  No Chest pain, Orthopnea, PND, Edema in Lower Extremities, Anasarca, Dizziness, Palpitations  Resp: +Dyspnea, No DOE,  +Productive Cough, No Non-Productive Cough, No Hemoptysis, No Wheezing.    GI: No Heartburn, Indigestion, Abdominal Pain, Nausea, Vomiting, Diarrhea, Constipation, Hematemesis, Hematochezia, Melena, Change in Bowel Habits,  Loss of Appetite  GU: No Dysuria, No Change in Color of Urine, No Urgency or Urinary Frequency, No Flank pain.  Musculoskeletal: No Joint Pain or Swelling, No Decreased Range of Motion, No Back Pain.  Neurologic: No Syncope, No Seizures, Muscle Weakness, Paresthesia, Vision Disturbance or Loss, No Diplopia, No Vertigo, No Difficulty Walking,  Skin: No Rash or Lesions. Psych: No Change in Mood or Affect, No Depression or Anxiety, No Memory loss, No Confusion, or Hallucinations   Past Medical History  Diagnosis Date  . Asthma   . Hypertension   . COPD (chronic  obstructive pulmonary disease) ([place])   . Cancer [facility])     Prostate   . Helicobacter pylori gastritis MAR 2016  . Multiple duodenal ulcers MAR 2016  . Atrial flutter ([place])   . Aortic valve stenosis   . Cardiomyopathy ([place])   . Mitral regurgitation   . Gout      Past Surgical History  Procedure Laterality Date  . Prostate surgery    . Hernia repair    . Esophagogastroduodenoscopy N/A 05/31/2014    H PYLORI GASTRITIS, DUODENAL ULCERS  . Colonoscopy    . Colonoscopy N/A 08/25/2014    Procedure: COLONOSCOPY;  Surgeon: Rogene Houston, MD;  Location[facility];  Service: Endoscopy;  Laterality: N/A;      Prior to Admission medications   Medication Sig Start Date End Date Taking? Authorizing Provider  carvedilol (COREG) 6.25 MG tablet Take 1 tablet (6.25 mg total) by mouth 2 (two) times daily with a meal. 05/23/13  Yes [name], MD  colchicine 0.6 MG tablet Take 1 tablet (0.6 mg total) by mouth 2 (two) times daily. Patient taking differently: Take 0.6 mg by mouth daily.  12/06/14  Yes Rolland Porter, MD  ferrous sulfate 325 (65 FE) MG tablet Take 325 mg by mouth at bedtime.    Yes Historical Provider, MD  Fluticasone-Salmeterol (ADVAIR DISKUS) 250-50 MCG/DOSE AEPB Inhale 1 puff into the lungs daily. 08/25/14  Yes Kathie Dike, MD  gabapentin (NEURONTIN) 300 MG capsule Take 300 mg by mouth 3 (three) times daily as needed (for pain/neuropathy).  02/23/14  Yes Historical Provider, MD  guaifenesin (ROBITUSSIN) 100  MG/5ML syrup Take 100 mg by mouth 3 (three) times daily as needed for congestion.   Yes Historical Provider, MD  lisinopril (PRINIVIL,ZESTRIL) 5 MG tablet Take 5 mg by mouth daily.   Yes Historical Provider, MD  pantoprazole (PROTONIX) 40 MG tablet Take 1 tablet (40 mg total) by mouth daily. 08/25/14  Yes Kathie Dike, MD  pravastatin (PRAVACHOL) 40 MG tablet Take 1 tablet by mouth at bedtime.  07/06/13  Yes Historical Provider, MD  VENTOLIN HFA 108 (90 BASE) MCG/ACT inhaler  Inhale 1 puff into the lungs every 6 (six) hours as needed for wheezing or shortness of breath.  05/10/14  Yes Historical Provider, MD  warfarin (COUMADIN) 3 MG tablet Take 1 tablet (3 mg total) by mouth See admin instructions. Take 4mg  on Mondays and Tuesdays.Take 3mg  on all other days. Restart on 08/27/14 Patient taking differently: Take 3 mg by mouth daily at 6 PM.  08/25/14  Yes Kathie Dike, MD  zolpidem (AMBIEN) 10 MG tablet Take 10 mg by mouth at bedtime.   Yes Historical Provider, MD  warfarin (COUMADIN) 4 MG tablet Take 1 tablet (4 mg total) by mouth 2 (two) times a week. Take 4mg  on Mondays and Tuesdays.Take 3mg  on all other days. Restart on 08/27/14 Patient not taking: Reported on 03/02/2015 08/25/14   Kathie Dike, MD     No Known Allergies  Social History:  reports that he quit smoking about 36 years ago. His smoking use included Cigarettes. He started smoking about 55 years ago. He has a 40 pack-year smoking history. He has quit using smokeless tobacco. He reports that he does not drink alcohol or use illicit drugs.    Family History  Problem Relation Age of Onset  . Stroke Mother   . Cancer Brother   . Cancer Other   . Colon cancer Neg Hx   . Colon polyps Neg Hx        Physical Exam:  GEN:  Pleasant Obese Elderly 69 y.o. Caucasian male examined and in no acute distress; cooperative with exam Filed Vitals:   03/02/15 1813 03/02/15 1920 03/02/15 2015 03/02/15 2112  BP: 143/60   139/62  Pulse: 126  108 101  Temp: 99 F (37.2 C)     TempSrc: Oral     Resp: 28   22  Height: 5\' 6"  (1.676 m)     Weight: 108.863 kg (240 lb)     SpO2: 95% 93% 92% 97%   Blood pressure 139/62, pulse 101, temperature 99 F (37.2 C), temperature source Oral, resp. rate 22, height 5\' 6"  (1.676 m), weight 108.863 kg (240 lb), SpO2 97 %. PSYCH: He is alert and oriented x4; does not appear anxious does not appear depressed; affect is normal HEENT: Normocephalic and Atraumatic, Mucous membranes  pink; PERRLA; EOM intact; Fundi:  Benign;  No scleral icterus, Nares: Patent, Oropharynx: Clear, Edentulous with Dentures,    Neck:  FROM, No Cervical Lymphadenopathy nor Thyromegaly or Carotid Bruit; No JVD; Breasts:: Not examined CHEST WALL: No tenderness CHEST: Normal respiration, clear to auscultation bilaterally HEART: Regular rate and rhythm; no murmurs rubs or gallops BACK: No kyphosis or scoliosis; No CVA tenderness ABDOMEN: Positive Bowel Sounds, Obese, Soft Non-Tender, No Rebound or Guarding; No Masses, No Organomegaly, No Pannus; No Intertriginous candida. Rectal Exam: Not done EXTREMITIES: No Cyanosis, Clubbing, or Edema; No Ulcerations. Genitalia: not examined PULSES: 2+ and symmetric SKIN: Normal hydration no rash or ulceration CNS:  Alert and Oriented x 4, No Focal Deficits Vascular:  pulses palpable throughout    Labs on Admission:  Basic Metabolic Panel:  Recent Labs Lab 03/02/15 1902  NA 135  K 4.2  CL 100*  CO2 28  GLUCOSE 118*  BUN 22*  CREATININE 1.57*  CALCIUM 8.6*   Liver Function Tests: No results for input(s): AST, ALT, ALKPHOS, BILITOT, PROT, ALBUMIN in the last 168 hours. No results for input(s): LIPASE, AMYLASE in the last 168 hours. No results for input(s): AMMONIA in the last 168 hours. CBC:  Recent Labs Lab 03/02/15 1902  WBC 22.1*  NEUTROABS 17.6*  HGB 11.1*  HCT 33.6*  MCV 91.3  PLT 293   Cardiac Enzymes: No results for input(s): CKTOTAL, CKMB, CKMBINDEX, TROPONINI in the last 168 hours.  BNP (last 3 results) No results for input(s): BNP in the last 8760 hours.  ProBNP (last 3 results) No results for input(s): PROBNP in the last 8760 hours.  CBG: No results for input(s): GLUCAP in the last 168 hours.  Radiological Exams on Admission: Dg Chest 2 View  03/02/2015  CLINICAL DATA:  Short of breath and congestion. EXAM: CHEST  2 VIEW COMPARISON:  01/06/2015 FINDINGS: Normal mediastinum and cardiac silhouette. Normal pulmonary  vasculature. No evidence of effusion, infiltrate, or pneumothorax. No acute bony abnormality. Degenerative osteophytosis of the thoracic spine. IMPRESSION: No active cardiopulmonary disease. Electronically Signed   By: Suzy Bouchard M.D.   On: 03/02/2015 19:09     EKG: Independently reviewed.    Assessment/Plan:   69 y.o. male with  Active Problems:    COPD exacerbation (HCC)   Steroid Taper   Duonebs   O2      HTN (hypertension)   Continue Carvedilol, Lisinopril   Monitor BPs      High cholesterol   Continue Pravastatin Rx      Atrial flutter (HCC)   On Coumadin Rx   On Carvedilol Rx     Chronic anticoagulation   On Coumadin Rx   Check Pt/INR daily    Pharmacy Adjustement PRN        Code Status:     FULL CODE        Family Communication:   No Family Present    Disposition Plan:    Inpatient Status        Time spent: [facility] Pager [phone]   If [place] Please Contact the Day Rounding Team MD for [facility]  If 7PM-7AM, Please Contact Night-Floor Coverage  [email or web address] Password [facility] 03/02/2015, 9:31 PM     ADDENDUM:   Patient was seen and examined on 03/02/2015

## 2015-03-02 NOTE — ED Notes (Signed)
Pt reports that he is congested, coughing and SOB with exertion. Coughing up white-green mucous. Cough started 3 weeks ago and congestion getting worse

## 2015-03-02 NOTE — ED Notes (Signed)
Pt desat to 88 when out of bed in room.

## 2015-03-03 DIAGNOSIS — Z7901 Long term (current) use of anticoagulants: Secondary | ICD-10-CM

## 2015-03-03 DIAGNOSIS — J9601 Acute respiratory failure with hypoxia: Secondary | ICD-10-CM

## 2015-03-03 LAB — CBC
HEMATOCRIT: 33.3 % — AB (ref 39.0–52.0)
Hemoglobin: 11 g/dL — ABNORMAL LOW (ref 13.0–17.0)
MCH: 30.5 pg (ref 26.0–34.0)
MCHC: 33 g/dL (ref 30.0–36.0)
MCV: 92.2 fL (ref 78.0–100.0)
Platelets: 284 10*3/uL (ref 150–400)
RBC: 3.61 MIL/uL — AB (ref 4.22–5.81)
RDW: 13.9 % (ref 11.5–15.5)
WBC: 16.9 10*3/uL — AB (ref 4.0–10.5)

## 2015-03-03 LAB — BASIC METABOLIC PANEL
ANION GAP: 6 (ref 5–15)
BUN: 23 mg/dL — ABNORMAL HIGH (ref 6–20)
CHLORIDE: 102 mmol/L (ref 101–111)
CO2: 30 mmol/L (ref 22–32)
Calcium: 8.9 mg/dL (ref 8.9–10.3)
Creatinine, Ser: 1.24 mg/dL (ref 0.61–1.24)
GFR calc non Af Amer: 57 mL/min — ABNORMAL LOW (ref >60)
Glucose, Bld: 185 mg/dL — ABNORMAL HIGH (ref 65–99)
POTASSIUM: 4.7 mmol/L (ref 3.5–5.1)
SODIUM: 138 mmol/L (ref 135–145)

## 2015-03-03 LAB — PROTIME-INR
INR: 1.5 — ABNORMAL HIGH (ref 0.00–1.49)
Prothrombin Time: 18.2 seconds — ABNORMAL HIGH (ref 11.6–15.2)

## 2015-03-03 MED ORDER — IPRATROPIUM-ALBUTEROL 0.5-2.5 (3) MG/3ML IN SOLN
3.0000 mL | Freq: Four times a day (QID) | RESPIRATORY_TRACT | Status: DC
  Administered 2015-03-03 (×2): 3 mL via RESPIRATORY_TRACT
  Filled 2015-03-03 (×2): qty 3

## 2015-03-03 MED ORDER — TIOTROPIUM BROMIDE MONOHYDRATE 18 MCG IN CAPS: 18.0000 ug | ORAL_CAPSULE | Freq: Every day | RESPIRATORY_TRACT | Status: DC

## 2015-03-03 MED ORDER — WARFARIN SODIUM 5 MG PO TABS: 5.0000 mg | ORAL_TABLET | Freq: Once | ORAL | Status: DC

## 2015-03-03 MED ORDER — LEVOFLOXACIN 500 MG PO TABS: 500.0000 mg | ORAL_TABLET | Freq: Every day | ORAL | Status: DC

## 2015-03-03 MED ORDER — COLCHICINE 0.6 MG PO TABS: 0.6000 mg | ORAL_TABLET | Freq: Every day | ORAL | Status: DC

## 2015-03-03 MED ORDER — ALBUTEROL SULFATE (2.5 MG/3ML) 0.083% IN NEBU: 2.5000 mg | INHALATION_SOLUTION | RESPIRATORY_TRACT | Status: AC | PRN

## 2015-03-03 MED ORDER — ALBUTEROL SULFATE (2.5 MG/3ML) 0.083% IN NEBU: 2.5000 mg | INHALATION_SOLUTION | RESPIRATORY_TRACT | Status: DC | PRN

## 2015-03-03 MED ORDER — PREDNISONE 10 MG PO TABS: ORAL_TABLET | ORAL | Status: DC

## 2015-03-03 MED ORDER — WARFARIN - PHARMACIST DOSING INPATIENT
Status: DC
Start: 2015-03-03 — End: 2015-03-03

## 2015-03-03 NOTE — Care Management Note (Signed)
Case Management Note  Patient Details  Name: Ryan Shea MRN: [id number] Date of Birth: December 01, 1945  Subjective/Objective:                  Pt is from home, lives with his wife and has adult children who act as support. Pt is ind at baseline. Pt uses no assistive devices for mobility. Pt has home O2 for PRN with port tanks and concentrator through [employer]. Pt plans to return home with self care.   Action/Plan: No CM needs.   Expected Discharge Date:     03/04/2015             Expected Discharge Plan:  Home/Self Care  In-House Referral:  NA  Discharge planning Services  CM Consult  Post Acute Care Choice:  NA Choice offered to:  NA  DME Arranged:    DME Agency:     HH Arranged:    HH Agency:     Status of Service:  In process, will continue to follow  Medicare Important Message Given:    Date Medicare IM Given:    Medicare IM give by:    Date Additional Medicare IM Given:    Additional Medicare Important Message give by:     If discussed at [place] of Stay Meetings, dates discussed:    Additional Comments:  Sherald Barge, RN 03/03/2015, 10:35 AM

## 2015-03-03 NOTE — Progress Notes (Signed)
Patient discharged with instructions, prescription, and care notes.  Verbalized understanding via teach back.  IV was removed and the site was WNL. Patient voiced no further complaints or concerns at the time of discharge.  Appointments scheduled per instructions.  Patient left the floor via w/c with staff and family in stable condition.  The patient O2 sats prior to activity was 95% on room air.  I ambulated him approx. 100 feet and his O2 sats was 95% as well.  The patient did appear some winded but stated that he would be okay and had no other complaints at that time.  I notified Dr. Roderic Palau of the O2 sats before and after the ambulation.

## 2015-03-03 NOTE — Progress Notes (Signed)
ANTICOAGULATION CONSULT NOTE - Follow up Pharmacy Consult for Coumadin Indication: atrial fibrillation  No Known Allergies  Patient Measurements: Height: 5\' 6"  (167.6 cm) Weight: 236 lb 8 oz (107.276 kg) IBW/kg (Calculated) : 63.8 Heparin Dosing Weight:   Vital Signs: Temp: 97.7 F (36.5 C) (12/26 0618) Temp Source: Oral (12/26 0618) BP: 126/64 mmHg (12/26 0618) Pulse Rate: 97 (12/26 0727)  Labs:  Recent Labs  03/02/15 1902 03/03/15 0518  HGB 11.1* 11.0*  HCT 33.6* 33.3*  PLT 293 284  LABPROT 19.0* 18.2*  INR 1.59* 1.50*  CREATININE 1.57* 1.24    Estimated Creatinine Clearance: 63.7 mL/min (by C-G formula based on Cr of 1.24).   Medical History: Past Medical History  Diagnosis Date  . Asthma   . Hypertension   . COPD (chronic obstructive pulmonary disease) ([place])   . Cancer [facility])     Prostate   . Helicobacter pylori gastritis MAR 2016  . Multiple duodenal ulcers MAR 2016  . Atrial flutter ([place])   . Aortic valve stenosis   . Cardiomyopathy ([place])   . Mitral regurgitation   . Gout     Medications:  Prescriptions prior to admission  Medication Sig Dispense Refill Last Dose  . carvedilol (COREG) 6.25 MG tablet Take 1 tablet (6.25 mg total) by mouth 2 (two) times daily with a meal. 180 tablet 3 03/02/2015 at 0700  . colchicine 0.6 MG tablet Take 1 tablet (0.6 mg total) by mouth 2 (two) times daily. (Patient taking differently: Take 0.6 mg by mouth daily. ) 20 tablet 0 03/02/2015 at Unknown time  . ferrous sulfate 325 (65 FE) MG tablet Take 325 mg by mouth at bedtime.    03/02/2015 at Unknown time  . Fluticasone-Salmeterol (ADVAIR DISKUS) 250-50 MCG/DOSE AEPB Inhale 1 puff into the lungs daily. 60 each 1 03/02/2015 at Unknown time  . gabapentin (NEURONTIN) 300 MG capsule Take 300 mg by mouth 3 (three) times daily as needed (for pain/neuropathy).    unknown at Unknown time  . guaifenesin (ROBITUSSIN) 100 MG/5ML syrup Take 100 mg by mouth 3 (three) times daily as  needed for congestion.   03/02/2015 at Unknown time  . lisinopril (PRINIVIL,ZESTRIL) 5 MG tablet Take 5 mg by mouth daily.   03/02/2015 at Unknown time  . pantoprazole (PROTONIX) 40 MG tablet Take 1 tablet (40 mg total) by mouth daily. 60 tablet 5 03/02/2015 at Unknown time  . pravastatin (PRAVACHOL) 40 MG tablet Take 1 tablet by mouth at bedtime.    03/01/2015 at Unknown time  . VENTOLIN HFA 108 (90 BASE) MCG/ACT inhaler Inhale 1 puff into the lungs every 6 (six) hours as needed for wheezing or shortness of breath.    unknown  . warfarin (COUMADIN) 3 MG tablet Take 1 tablet (3 mg total) by mouth See admin instructions. Take 4mg  on Mondays and Tuesdays.Take 3mg  on all other days. Restart on 08/27/14 (Patient taking differently: Take 3 mg by mouth daily at 6 PM. )   03/01/2015 at 1900  . zolpidem (AMBIEN) 10 MG tablet Take 10 mg by mouth at bedtime.   03/01/2015 at Unknown time  . warfarin (COUMADIN) 4 MG tablet Take 1 tablet (4 mg total) by mouth 2 (two) times a week. Take 4mg  on Mondays and Tuesdays.Take 3mg  on all other days. Restart on 08/27/14 (Patient not taking: Reported on 03/02/2015)   Not Taking at Unknown time    Assessment: Continuation of Coumadin PTA for AFIB INR sub therapeutic on admission INR continues sub therapeutic  with no increase after home dose last evening No signs of bleeding, labs stable  Goal of Therapy:  INR 2-3 Monitor platelets by anticoagulation protocol: Yes   Plan:  Coumadin 5mg  po x 1 dose tonight (increase dose to obtain therapeutic INR) INR/PT daily Monitor CBC/ Platelets Abner Greenspan, Chana Lindstrom Bennett 03/03/2015,9:25 AM

## 2015-03-03 NOTE — Discharge Summary (Signed)
Physician Discharge Summary  Ryan Shea [id number] DOB: 1945/07/15 DOA: 03/02/2015  PCP: Bronson Curb, PA-C  Admit date: 03/02/2015 Discharge date: 03/03/2015  Time spent: 35 minutes  Recommendations for Outpatient Follow-up:  1. Follow up with PCP in 1-2 weeks for resolution of COPD exacerbation .     Discharge Diagnoses:  Active Problems:   HTN (hypertension)   High cholesterol   Atrial flutter (HCC)   Chronic anticoagulation   COPD exacerbation (HCC) Acute respiratory failure with hypoxia  Discharge Condition: Improved   Diet recommendation: Heart healthy   Filed Weights   03/02/15 1813 03/02/15 2218  Weight: 108.863 kg (240 lb) 107.276 kg (236 lb 8 oz)    History of present illness:  58 yom with history of COPD, HTN atrial flutter on Coumadin presented with complaints of increased productive cough with greenish sputum and chest tightness. While in the ED noted to have decreased O2 stats to 88% and had mild improvement with IV Solumedrol and nebs. Admitted for further evaluation and treatment.  Hospital Course:  1. COPD exacerbation, improving. Patient presented with productive cough, wheezing, and SOB. CXR unremarkable. Started on IV abx, steroid, and nebs with great improvement. Wheezing is now resolved. Breathing comfortably on room air. Discharged with home nebs, steroid taper and oral abx.  2. Acute respiratory failure with hypoxia due to COPD exacerbation, resolved. On admission noted to have O2 stats at 88% on RA. At baseline patient is stable on room air. Was able to successfully wean oxygen off to room air.   3. Essential HTN, stable. Continue carvedilol and lisinopril. 4. HLD, continue statin 5. Atrial flutter, chronically on coumadin and carvedilol.  6. Chronic anticoagulation, on Coumadin.   Procedures:  None   Consultations:  None   Discharge Exam: Filed Vitals:   03/03/15 0618 03/03/15 0727  BP: 126/64   Pulse: 86 97  Temp:  97.7 F (36.5 C)   Resp: 20 18    General: NAD Cardiovascular: s1, s2 rrr Respiratory: cta B  Discharge Instructions   Discharge Instructions    Diet - low sodium heart healthy    Complete by:  As directed      Increase activity slowly    Complete by:  As directed           Current Discharge Medication List    START taking these medications   Details  albuterol (PROVENTIL) (2.5 MG/3ML) 0.083% nebulizer solution Take 3 mLs (2.5 mg total) by nebulization every 4 (four) hours as needed for wheezing or shortness of breath. Qty: 75 mL, Refills: 12    levofloxacin (LEVAQUIN) 500 MG tablet Take 1 tablet (500 mg total) by mouth daily. Qty: 5 tablet, Refills: 0    predniSONE (DELTASONE) 10 MG tablet Take 40mg  po daily for 2 days then 30mg  daily for 2 days then 20mg  daily for 2 days then 10mg  daily then stop Qty: 20 tablet, Refills: 0    tiotropium (SPIRIVA HANDIHALER) 18 MCG inhalation capsule Place 1 capsule (18 mcg total) into inhaler and inhale daily. Qty: 30 capsule, Refills: 12      CONTINUE these medications which have CHANGED   Details  colchicine 0.6 MG tablet Take 1 tablet (0.6 mg total) by mouth daily. Qty: 20 tablet, Refills: 0      CONTINUE these medications which have NOT CHANGED   Details  carvedilol (COREG) 6.25 MG tablet Take 1 tablet (6.25 mg total) by mouth 2 (two) times daily with a meal. Qty: 180 tablet,  Refills: 3    ferrous sulfate 325 (65 FE) MG tablet Take 325 mg by mouth at bedtime.     Fluticasone-Salmeterol (ADVAIR DISKUS) 250-50 MCG/DOSE AEPB Inhale 1 puff into the lungs daily. Qty: 60 each, Refills: 1    gabapentin (NEURONTIN) 300 MG capsule Take 300 mg by mouth 3 (three) times daily as needed (for pain/neuropathy).     guaifenesin (ROBITUSSIN) 100 MG/5ML syrup Take 100 mg by mouth 3 (three) times daily as needed for congestion.    lisinopril (PRINIVIL,ZESTRIL) 5 MG tablet Take 5 mg by mouth daily.    pantoprazole (PROTONIX) 40 MG tablet  Take 1 tablet (40 mg total) by mouth daily. Qty: 60 tablet, Refills: 5    pravastatin (PRAVACHOL) 40 MG tablet Take 1 tablet by mouth at bedtime.     VENTOLIN HFA 108 (90 BASE) MCG/ACT inhaler Inhale 1 puff into the lungs every 6 (six) hours as needed for wheezing or shortness of breath.     warfarin (COUMADIN) 3 MG tablet Take 1 tablet (3 mg total) by mouth See admin instructions. Take 4mg  on Mondays and Tuesdays.Take 3mg  on all other days. Restart on 08/27/14    zolpidem (AMBIEN) 10 MG tablet Take 10 mg by mouth at bedtime.       No Known Allergies    The results of significant diagnostics from this hospitalization (including imaging, microbiology, ancillary and laboratory) are listed below for reference.    Significant Diagnostic Studies: Dg Chest 2 View  03/02/2015  CLINICAL DATA:  Short of breath and congestion. EXAM: CHEST  2 VIEW COMPARISON:  01/06/2015 FINDINGS: Normal mediastinum and cardiac silhouette. Normal pulmonary vasculature. No evidence of effusion, infiltrate, or pneumothorax. No acute bony abnormality. Degenerative osteophytosis of the thoracic spine. IMPRESSION: No active cardiopulmonary disease. Electronically Signed   By: Suzy Bouchard M.D.   On: 03/02/2015 19:09    Microbiology: No results found for this or any previous visit (from the past 240 hour(s)).   Labs: Basic Metabolic Panel:  Recent Labs Lab 03/02/15 1902 03/03/15 0518  NA 135 138  K 4.2 4.7  CL 100* 102  CO2 28 30  GLUCOSE 118* 185*  BUN 22* 23*  CREATININE 1.57* 1.24  CALCIUM 8.6* 8.9   CBC:  Recent Labs Lab 03/02/15 1902 03/03/15 0518  WBC 22.1* 16.9*  NEUTROABS 17.6*  --   HGB 11.1* 11.0*  HCT 33.6* 33.3*  MCV 91.3 92.2  PLT 293 284    Signed:  Kathie Dike, MD  [facility] 03/03/2015, 11:22 AM    By signing my name below, I, Rennis Harding, attest that this documentation has been prepared under the direction and in the presence of Kathie Dike,  MD. Electronically signed: Rennis Harding, Scribe. 03/03/2015 10:44am   I, Dr. Kathie Dike, personally performed the services described in this documentaiton. All medical record entries made by the scribe were at my direction and in my presence. I have reviewed the chart and agree that the record reflects my personal performance and is accurate and complete  Kathie Dike, MD, 03/03/2015 11:22 AM

## 2015-03-03 NOTE — Care Management Important Message (Signed)
Important Message  Patient Details  Name: ROCH DRUIN MRN: [id number] Date of Birth: 26-Jul-1945   Medicare Important Message Given:  N/A - LOS <3 / Initial given by admissions    Sherald Barge, RN 03/03/2015, 11:28 AM

## 2015-03-03 NOTE — Care Management Note (Signed)
Case Management Note  Patient Details  Name: MELIH HARDEMAN MRN: [id number] Date of Birth: 1946/02/09  Expected Discharge Date:                  Expected Discharge Plan:  Home/Self Care  In-House Referral:  NA  Discharge planning Services  CM Consult  Post Acute Care Choice:  NA Choice offered to:  NA  DME Arranged:    DME Agency:     HH Arranged:    [place]:     Status of Service:  Completed, signed off  Medicare Important Message Given:    Date Medicare IM Given:    Medicare IM give by:    Date Additional Medicare IM Given:    Additional Medicare Important Message give by:     If discussed at [place] of Stay Meetings, dates discussed:    Additional Comments: Pt discharging home today with self care.   Sherald Barge, RN 03/03/2015, 11:28 AM

## 2015-03-22 ENCOUNTER — Emergency Department (HOSPITAL_COMMUNITY): Payer: Medicare PPO

## 2015-03-22 ENCOUNTER — Encounter (HOSPITAL_COMMUNITY): Payer: Self-pay | Admitting: Emergency Medicine

## 2015-03-22 ENCOUNTER — Inpatient Hospital Stay (HOSPITAL_COMMUNITY)
Admission: EM | Admit: 2015-03-22 | Discharge: 2015-03-27 | DRG: 280 | Disposition: A | Discharge status: Home / Self Care | Payer: Medicare PPO | Attending: Internal Medicine | Admitting: Internal Medicine

## 2015-03-22 DIAGNOSIS — N183 Chronic kidney disease, stage 3 unspecified: Secondary | ICD-10-CM

## 2015-03-22 DIAGNOSIS — D649 Anemia, unspecified: Secondary | ICD-10-CM | POA: Diagnosis present

## 2015-03-22 DIAGNOSIS — Z6837 Body mass index (BMI) 37.0-37.9, adult: Secondary | ICD-10-CM

## 2015-03-22 DIAGNOSIS — J154 Pneumonia due to other streptococci: Secondary | ICD-10-CM | POA: Diagnosis present

## 2015-03-22 DIAGNOSIS — J9621 Acute and chronic respiratory failure with hypoxia: Secondary | ICD-10-CM | POA: Diagnosis present

## 2015-03-22 DIAGNOSIS — D72829 Elevated white blood cell count, unspecified: Secondary | ICD-10-CM

## 2015-03-22 DIAGNOSIS — Z823 Family history of stroke: Secondary | ICD-10-CM | POA: Diagnosis not present

## 2015-03-22 DIAGNOSIS — I4892 Unspecified atrial flutter: Secondary | ICD-10-CM | POA: Diagnosis present

## 2015-03-22 DIAGNOSIS — Z87891 Personal history of nicotine dependence: Secondary | ICD-10-CM | POA: Diagnosis not present

## 2015-03-22 DIAGNOSIS — R7989 Other specified abnormal findings of blood chemistry: Secondary | ICD-10-CM

## 2015-03-22 DIAGNOSIS — J441 Chronic obstructive pulmonary disease with (acute) exacerbation: Secondary | ICD-10-CM | POA: Diagnosis present

## 2015-03-22 DIAGNOSIS — J45909 Unspecified asthma, uncomplicated: Secondary | ICD-10-CM | POA: Diagnosis present

## 2015-03-22 DIAGNOSIS — I429 Cardiomyopathy, unspecified: Secondary | ICD-10-CM | POA: Diagnosis present

## 2015-03-22 DIAGNOSIS — I248 Other forms of acute ischemic heart disease: Secondary | ICD-10-CM | POA: Diagnosis present

## 2015-03-22 DIAGNOSIS — I350 Nonrheumatic aortic (valve) stenosis: Secondary | ICD-10-CM | POA: Diagnosis present

## 2015-03-22 DIAGNOSIS — Z8711 Personal history of peptic ulcer disease: Secondary | ICD-10-CM

## 2015-03-22 DIAGNOSIS — Z79899 Other long term (current) drug therapy: Secondary | ICD-10-CM | POA: Diagnosis not present

## 2015-03-22 DIAGNOSIS — M109 Gout, unspecified: Secondary | ICD-10-CM | POA: Diagnosis present

## 2015-03-22 DIAGNOSIS — I483 Typical atrial flutter: Secondary | ICD-10-CM | POA: Diagnosis not present

## 2015-03-22 DIAGNOSIS — I10 Essential (primary) hypertension: Secondary | ICD-10-CM | POA: Diagnosis not present

## 2015-03-22 DIAGNOSIS — I129 Hypertensive chronic kidney disease with stage 1 through stage 4 chronic kidney disease, or unspecified chronic kidney disease: Secondary | ICD-10-CM | POA: Diagnosis present

## 2015-03-22 DIAGNOSIS — J209 Acute bronchitis, unspecified: Secondary | ICD-10-CM | POA: Diagnosis present

## 2015-03-22 DIAGNOSIS — Z9981 Dependence on supplemental oxygen: Secondary | ICD-10-CM

## 2015-03-22 DIAGNOSIS — J189 Pneumonia, unspecified organism: Secondary | ICD-10-CM | POA: Diagnosis not present

## 2015-03-22 DIAGNOSIS — Z7901 Long term (current) use of anticoagulants: Secondary | ICD-10-CM

## 2015-03-22 DIAGNOSIS — I340 Nonrheumatic mitral (valve) insufficiency: Secondary | ICD-10-CM | POA: Diagnosis present

## 2015-03-22 DIAGNOSIS — I2510 Atherosclerotic heart disease of native coronary artery without angina pectoris: Secondary | ICD-10-CM | POA: Diagnosis not present

## 2015-03-22 DIAGNOSIS — R778 Other specified abnormalities of plasma proteins: Secondary | ICD-10-CM | POA: Insufficient documentation

## 2015-03-22 DIAGNOSIS — I214 Non-ST elevation (NSTEMI) myocardial infarction: Secondary | ICD-10-CM | POA: Diagnosis present

## 2015-03-22 DIAGNOSIS — R079 Chest pain, unspecified: Secondary | ICD-10-CM | POA: Diagnosis not present

## 2015-03-22 DIAGNOSIS — J440 Chronic obstructive pulmonary disease with acute lower respiratory infection: Secondary | ICD-10-CM | POA: Diagnosis present

## 2015-03-22 DIAGNOSIS — R0602 Shortness of breath: Secondary | ICD-10-CM | POA: Insufficient documentation

## 2015-03-22 DIAGNOSIS — J9601 Acute respiratory failure with hypoxia: Secondary | ICD-10-CM

## 2015-03-22 LAB — TROPONIN I
TROPONIN I: 0.28 ng/mL — AB (ref <0.031)
TROPONIN I: 1.41 ng/mL — AB (ref <0.031)
Troponin I: 1.4 ng/mL (ref <0.031)
Troponin I: 1.25 ng/mL (ref <0.031)

## 2015-03-22 LAB — COMPREHENSIVE METABOLIC PANEL
ALK PHOS: 62 U/L (ref 38–126)
ALT: 17 U/L (ref 17–63)
ANION GAP: 8 (ref 5–15)
AST: 15 U/L (ref 15–41)
Albumin: 3.1 g/dL — ABNORMAL LOW (ref 3.5–5.0)
BUN: 38 mg/dL — ABNORMAL HIGH (ref 6–20)
CALCIUM: 8.5 mg/dL — AB (ref 8.9–10.3)
CO2: 25 mmol/L (ref 22–32)
CREATININE: 1.36 mg/dL — AB (ref 0.61–1.24)
Chloride: 106 mmol/L (ref 101–111)
GFR, EST AFRICAN AMERICAN: 59 mL/min — AB (ref >60)
GFR, EST NON AFRICAN AMERICAN: 51 mL/min — AB (ref >60)
Glucose, Bld: 162 mg/dL — ABNORMAL HIGH (ref 65–99)
Potassium: 5.1 mmol/L (ref 3.5–5.1)
SODIUM: 139 mmol/L (ref 135–145)
Total Bilirubin: 0.6 mg/dL (ref 0.3–1.2)
Total Protein: 6.8 g/dL (ref 6.5–8.1)

## 2015-03-22 LAB — CBC WITH DIFFERENTIAL/PLATELET
BASOS ABS: 0 10*3/uL (ref 0.0–0.1)
BASOS PCT: 0 %
Eosinophils Absolute: 0.1 10*3/uL (ref 0.0–0.7)
Eosinophils Relative: 0 %
HEMATOCRIT: 32.8 % — AB (ref 39.0–52.0)
Hemoglobin: 10.6 g/dL — ABNORMAL LOW (ref 13.0–17.0)
Lymphocytes Relative: 10 %
Lymphs Abs: 1.7 10*3/uL (ref 0.7–4.0)
MCH: 29.6 pg (ref 26.0–34.0)
MCHC: 32.3 g/dL (ref 30.0–36.0)
MCV: 91.6 fL (ref 78.0–100.0)
MONO ABS: 1.1 10*3/uL — AB (ref 0.1–1.0)
Monocytes Relative: 7 %
NEUTROS ABS: 14.3 10*3/uL — AB (ref 1.7–7.7)
NEUTROS PCT: 83 %
PLATELETS: 354 10*3/uL (ref 150–400)
RBC: 3.58 MIL/uL — AB (ref 4.22–5.81)
RDW: 14.4 % (ref 11.5–15.5)
WBC: 17.2 10*3/uL — AB (ref 4.0–10.5)

## 2015-03-22 LAB — PROTIME-INR
INR: 2.97 — ABNORMAL HIGH (ref 0.00–1.49)
PROTHROMBIN TIME: 30.4 s — AB (ref 11.6–15.2)

## 2015-03-22 MED ORDER — MORPHINE SULFATE (PF) 2 MG/ML IV SOLN: 1.0000 mg | INTRAVENOUS | Status: DC | PRN

## 2015-03-22 MED ORDER — LISINOPRIL 5 MG PO TABS
5.0000 mg | ORAL_TABLET | Freq: Every day | ORAL | Status: DC
  Administered 2015-03-23 – 2015-03-24 (×2): 5 mg via ORAL
  Filled 2015-03-22 (×2): qty 1

## 2015-03-22 MED ORDER — PRAVASTATIN SODIUM 40 MG PO TABS
40.0000 mg | ORAL_TABLET | Freq: Every day | ORAL | Status: DC
  Administered 2015-03-22 – 2015-03-26 (×5): 40 mg via ORAL
  Filled 2015-03-22 (×7): qty 1

## 2015-03-22 MED ORDER — WARFARIN SODIUM 1 MG PO TABS
ORAL_TABLET | ORAL | Status: AC
  Filled 2015-03-22: qty 1

## 2015-03-22 MED ORDER — LORAZEPAM 2 MG/ML IJ SOLN
1.0000 mg | Freq: Once | INTRAMUSCULAR | Status: AC
  Administered 2015-03-22: 1 mg via INTRAVENOUS
  Filled 2015-03-22: qty 1

## 2015-03-22 MED ORDER — SODIUM CHLORIDE 0.9 % IJ SOLN
3.0000 mL | Freq: Two times a day (BID) | INTRAMUSCULAR | Status: DC
  Administered 2015-03-22 – 2015-03-27 (×7): 3 mL via INTRAVENOUS

## 2015-03-22 MED ORDER — PANTOPRAZOLE SODIUM 40 MG PO TBEC
40.0000 mg | DELAYED_RELEASE_TABLET | Freq: Every day | ORAL | Status: DC
  Administered 2015-03-22 – 2015-03-27 (×6): 40 mg via ORAL
  Filled 2015-03-22 (×6): qty 1

## 2015-03-22 MED ORDER — ONDANSETRON HCL 4 MG PO TABS: 4.0000 mg | ORAL_TABLET | Freq: Four times a day (QID) | ORAL | Status: DC | PRN

## 2015-03-22 MED ORDER — SODIUM CHLORIDE 0.9 % IV SOLN
250.0000 mL | INTRAVENOUS | Status: DC | PRN
  Administered 2015-03-22: 250 mL via INTRAVENOUS

## 2015-03-22 MED ORDER — TIOTROPIUM BROMIDE MONOHYDRATE 18 MCG IN CAPS
18.0000 ug | ORAL_CAPSULE | Freq: Every day | RESPIRATORY_TRACT | Status: DC
  Administered 2015-03-23 – 2015-03-24 (×2): 18 ug via RESPIRATORY_TRACT
  Filled 2015-03-22 (×4): qty 5

## 2015-03-22 MED ORDER — ONDANSETRON HCL 4 MG/2ML IJ SOLN: 4.0000 mg | Freq: Four times a day (QID) | INTRAMUSCULAR | Status: DC | PRN

## 2015-03-22 MED ORDER — SODIUM CHLORIDE 0.9 % IJ SOLN: 3.0000 mL | INTRAMUSCULAR | Status: DC | PRN

## 2015-03-22 MED ORDER — CARVEDILOL 6.25 MG PO TABS
6.2500 mg | ORAL_TABLET | Freq: Two times a day (BID) | ORAL | Status: DC
  Administered 2015-03-22 – 2015-03-27 (×10): 6.25 mg via ORAL
  Filled 2015-03-22 (×10): qty 1

## 2015-03-22 MED ORDER — CEFTRIAXONE SODIUM 1 G IJ SOLR
1.0000 g | INTRAMUSCULAR | Status: DC
  Administered 2015-03-22 – 2015-03-26 (×5): 1 g via INTRAVENOUS
  Filled 2015-03-22 (×6): qty 10

## 2015-03-22 MED ORDER — ACETAMINOPHEN 325 MG PO TABS: 650.0000 mg | ORAL_TABLET | Freq: Four times a day (QID) | ORAL | Status: DC | PRN

## 2015-03-22 MED ORDER — WARFARIN - PHARMACIST DOSING INPATIENT: Status: DC

## 2015-03-22 MED ORDER — DEXTROSE 5 % IV SOLN
500.0000 mg | INTRAVENOUS | Status: DC
  Administered 2015-03-22 – 2015-03-24 (×3): 500 mg via INTRAVENOUS
  Filled 2015-03-22 (×4): qty 500

## 2015-03-22 MED ORDER — ASPIRIN 81 MG PO CHEW
81.0000 mg | CHEWABLE_TABLET | Freq: Every day | ORAL | Status: DC
  Administered 2015-03-23 – 2015-03-27 (×4): 81 mg via ORAL
  Filled 2015-03-22 (×5): qty 1

## 2015-03-22 MED ORDER — WARFARIN SODIUM 2 MG PO TABS
ORAL_TABLET | ORAL | Status: AC
  Filled 2015-03-22: qty 1

## 2015-03-22 MED ORDER — SENNOSIDES-DOCUSATE SODIUM 8.6-50 MG PO TABS
1.0000 | ORAL_TABLET | Freq: Every evening | ORAL | Status: DC | PRN
  Filled 2015-03-22: qty 1

## 2015-03-22 MED ORDER — ALBUTEROL SULFATE (2.5 MG/3ML) 0.083% IN NEBU
2.5000 mg | INHALATION_SOLUTION | RESPIRATORY_TRACT | Status: DC | PRN
  Administered 2015-03-23 – 2015-03-26 (×3): 2.5 mg via RESPIRATORY_TRACT
  Filled 2015-03-22 (×3): qty 3

## 2015-03-22 MED ORDER — SODIUM CHLORIDE 0.9 % IJ SOLN
3.0000 mL | Freq: Two times a day (BID) | INTRAMUSCULAR | Status: DC
  Administered 2015-03-22 – 2015-03-26 (×9): 3 mL via INTRAVENOUS

## 2015-03-22 MED ORDER — MOMETASONE FURO-FORMOTEROL FUM 100-5 MCG/ACT IN AERO
2.0000 | INHALATION_SPRAY | Freq: Two times a day (BID) | RESPIRATORY_TRACT | Status: DC
  Administered 2015-03-23 – 2015-03-27 (×7): 2 via RESPIRATORY_TRACT
  Filled 2015-03-22 (×3): qty 8.8

## 2015-03-22 MED ORDER — WARFARIN SODIUM 3 MG PO TABS
3.0000 mg | ORAL_TABLET | Freq: Once | ORAL | Status: AC
  Administered 2015-03-22: 3 mg via ORAL
  Filled 2015-03-22: qty 1

## 2015-03-22 MED ORDER — ACETAMINOPHEN 650 MG RE SUPP: 650.0000 mg | Freq: Four times a day (QID) | RECTAL | Status: DC | PRN

## 2015-03-22 MED ORDER — ASPIRIN 81 MG PO CHEW
324.0000 mg | CHEWABLE_TABLET | Freq: Once | ORAL | Status: AC
  Administered 2015-03-22: 324 mg via ORAL
  Filled 2015-03-22: qty 4

## 2015-03-22 MED ORDER — METHYLPREDNISOLONE SODIUM SUCC 125 MG IJ SOLR
80.0000 mg | Freq: Three times a day (TID) | INTRAMUSCULAR | Status: DC
Start: 2015-03-22 — End: 2015-03-24
  Administered 2015-03-22 – 2015-03-24 (×6): 80 mg via INTRAVENOUS
  Filled 2015-03-22 (×6): qty 2

## 2015-03-22 NOTE — Progress Notes (Signed)
ANTIBIOTIC CONSULT NOTE  Pharmacy Consult for Renal Adjustment of ABX if needed Rocephin and Zithromax 1/14 >>  No Known Allergies  Patient Measurements: Height: 5\' 6"  (167.6 cm) Weight: 248 lb (112.492 kg) IBW/kg (Calculated) : 63.8  Vital Signs: Temp: 98.7 F (37.1 C) (01/14 0725) Temp Source: Axillary (01/14 0725) BP: 110/64 mmHg (01/14 1230) Pulse Rate: 114 (01/14 1230)  Labs:  Recent Labs  03/22/15 0749  WBC 17.2*  HGB 10.6*  PLT 354  CREATININE 1.36*    No results for input(s): VANCOTROUGH, VANCOPEAK, VANCORANDOM, GENTTROUGH, GENTPEAK, GENTRANDOM, TOBRATROUGH, TOBRAPEAK, TOBRARND, AMIKACINPEAK, AMIKACINTROU, AMIKACIN in the last 72 hours.   Medical History: Past Medical History  Diagnosis Date  . Asthma   . Hypertension   . COPD (chronic obstructive pulmonary disease) ([place])   . Cancer [facility])     Prostate   . Helicobacter pylori gastritis MAR 2016  . Multiple duodenal ulcers MAR 2016  . Atrial flutter ([place])   . Aortic valve stenosis   . Cardiomyopathy ([place])   . Mitral regurgitation   . Gout     Assessment: No renal adjustment needed.    Estimated Creatinine Clearance: 59.5 mL/min (by C-G formula based on Cr of 1.36).  Plan: Continue current Rx  Ena Dawley, RPH 03/22/2015,1:34 PM

## 2015-03-22 NOTE — ED Provider Notes (Addendum)
CSN: [id number]     Arrival date & time 03/22/15  [id number] History  By signing my name below, I, Erling Conte, attest that this documentation has been prepared under the direction and in the presence of Nat Christen, MD. Electronically Signed: Erling Conte, ED Scribe. 03/22/2015. 9:21 AM.     Chief Complaint  Patient presents with  . Shortness of Breath   LEVEL 5 CAVEAT-URGENT NEED FOR INTERVENTION  The history is provided by the EMS personnel and the patient. No language interpreter was used.    HPI Comments: Ryan Shea is a 70 y.o. male who presents to the Emergency Department complaining of severe wheezing and SOB that began Friday evening. Pt has a PMHx h/o of similar issues. No recent illness. He denies any chest pain. Pt has never been intubated before. He had nebulizer treatments at home and via EMS.   IM sulomedrol administered. No fever sweats or chills.   Past Medical History  Diagnosis Date  . Asthma   . Hypertension   . COPD (chronic obstructive pulmonary disease) ([place])   . Cancer [facility])     Prostate   . Helicobacter pylori gastritis MAR 2016  . Multiple duodenal ulcers MAR 2016  . Atrial flutter ([place])   . Aortic valve stenosis   . Cardiomyopathy ([place])   . Mitral regurgitation   . Gout    Past Surgical History  Procedure Laterality Date  . Prostate surgery    . Hernia repair    . Esophagogastroduodenoscopy N/A 05/31/2014    H PYLORI GASTRITIS, DUODENAL ULCERS  . Colonoscopy    . Colonoscopy N/A 08/25/2014    Procedure: COLONOSCOPY;  Surgeon: Rogene Houston, MD;  Location: [facility] ENDO SUITE;  Service: Endoscopy;  Laterality: N/A;   Family History  Problem Relation Age of Onset  . Stroke Mother   . Cancer Brother   . Cancer Other   . Colon cancer Neg Hx   . Colon polyps Neg Hx    Social History  Substance Use Topics  . Smoking status: Former Smoker -- 2.00 packs/day for 20 years    Types: Cigarettes    Start date: 09/01/1959    Quit date: 09/01/1978  .  Smokeless tobacco: Former Systems developer     Comment: quit 30 years ago  . Alcohol Use: No    Review of Systems A complete 10 system review of systems was obtained and all systems are negative except as noted in the HPI and PMH.     Allergies  Review of patient's allergies indicates no known allergies.  Home Medications   Prior to Admission medications   Medication Sig Start Date End Date Taking? Authorizing Provider  albuterol (PROVENTIL) (2.5 MG/3ML) 0.083% nebulizer solution Take 3 mLs (2.5 mg total) by nebulization every 4 (four) hours as needed for wheezing or shortness of breath. 03/03/15   Kathie Dike, MD  carvedilol (COREG) 6.25 MG tablet Take 1 tablet (6.25 mg total) by mouth 2 (two) times daily with a meal. 05/23/13   [name], MD  colchicine 0.6 MG tablet Take 1 tablet (0.6 mg total) by mouth daily. 03/03/15   Kathie Dike, MD  ferrous sulfate 325 (65 FE) MG tablet Take 325 mg by mouth at bedtime.     Historical Provider, MD  Fluticasone-Salmeterol (ADVAIR DISKUS) 250-50 MCG/DOSE AEPB Inhale 1 puff into the lungs daily. 08/25/14   Kathie Dike, MD  gabapentin (NEURONTIN) 300 MG capsule Take 300 mg by mouth 3 (three) times daily as  needed (for pain/neuropathy).  02/23/14   Historical Provider, MD  guaifenesin (ROBITUSSIN) 100 MG/5ML syrup Take 100 mg by mouth 3 (three) times daily as needed for congestion.    Historical Provider, MD  levofloxacin (LEVAQUIN) 500 MG tablet Take 1 tablet (500 mg total) by mouth daily. 03/03/15   Kathie Dike, MD  lisinopril (PRINIVIL,ZESTRIL) 5 MG tablet Take 5 mg by mouth daily.    Historical Provider, MD  pantoprazole (PROTONIX) 40 MG tablet Take 1 tablet (40 mg total) by mouth daily. 08/25/14   Kathie Dike, MD  pravastatin (PRAVACHOL) 40 MG tablet Take 1 tablet by mouth at bedtime.  07/06/13   Historical Provider, MD  predniSONE (DELTASONE) 10 MG tablet Take 40mg  po daily for 2 days then 30mg  daily for 2 days then 20mg  daily for 2 days  then 10mg  daily then stop 03/03/15   Kathie Dike, MD  tiotropium (SPIRIVA HANDIHALER) 18 MCG inhalation capsule Place 1 capsule (18 mcg total) into inhaler and inhale daily. 03/03/15   Kathie Dike, MD  VENTOLIN HFA 108 (90 BASE) MCG/ACT inhaler Inhale 1 puff into the lungs every 6 (six) hours as needed for wheezing or shortness of breath.  05/10/14   Historical Provider, MD  warfarin (COUMADIN) 3 MG tablet Take 1 tablet (3 mg total) by mouth See admin instructions. Take 4mg  on Mondays and Tuesdays.Take 3mg  on all other days. Restart on 08/27/14 Patient taking differently: Take 3 mg by mouth daily at 6 PM.  08/25/14   Kathie Dike, MD  zolpidem (AMBIEN) 10 MG tablet Take 10 mg by mouth at bedtime.    Historical Provider, MD   BP 113/67 mmHg  Pulse 136  Temp(Src) 98.7 F (37.1 C) (Axillary)  Resp 17  Ht 5\' 6"  (1.676 m)  Wt 248 lb (112.492 kg)  BMI 40.05 kg/m2  SpO2 99% Physical Exam  Constitutional: He is oriented to person, place, and time. He appears well-developed and well-nourished.  Obese, tachypneic and dyspneic  HENT:  Head: Normocephalic and atraumatic.  Eyes: Conjunctivae and EOM are normal. Pupils are equal, round, and reactive to light.  Neck: Normal range of motion. Neck supple.  Cardiovascular: Regular rhythm.  Tachycardia present.   Pulmonary/Chest: Effort normal and breath sounds normal.  B/l expiatory wheezes  Abdominal: Soft. Bowel sounds are normal.  Musculoskeletal: Normal range of motion.  Neurological: He is alert and oriented to person, place, and time.  Skin: Skin is warm and dry.  Psychiatric: He has a normal mood and affect. His behavior is normal.  Nursing note and vitals reviewed.   ED Course  Procedures (including critical care time)  DIAGNOSTIC STUDIES: Oxygen Saturation is 100% on 2LPM via [place], normal by my interpretation.    COORDINATION OF CARE: 9:21 AM- Will place pt on BiPAP, CXR.  Pt advised of plan for treatment and pt  agrees.     Labs Review Labs Reviewed  CBC WITH DIFFERENTIAL/PLATELET - Abnormal; Notable for the following:    WBC 17.2 (*)    RBC 3.58 (*)    Hemoglobin 10.6 (*)    HCT 32.8 (*)    Neutro Abs 14.3 (*)    Monocytes Absolute 1.1 (*)    All other components within normal limits  COMPREHENSIVE METABOLIC PANEL - Abnormal; Notable for the following:    Glucose, Bld 162 (*)    BUN 38 (*)    Creatinine, Ser 1.36 (*)    Calcium 8.5 (*)    Albumin 3.1 (*)    GFR  calc non Af Amer 51 (*)    GFR calc Af Amer 59 (*)    All other components within normal limits  TROPONIN I - Abnormal; Notable for the following:    Troponin I 0.28 (*)    All other components within normal limits    Imaging Review Dg Chest Portable 1 View  03/22/2015  CLINICAL DATA:  Short of breath.  Tachycardia. EXAM: PORTABLE CHEST 1 VIEW COMPARISON:  03/02/2015. FINDINGS: Cardiac silhouette is normal in size. No mediastinal or hilar masses or convincing adenopathy. Study is degraded by motion. There is irregular bronchial wall thickening most evident in the right lung base. This may be chronic accentuated by motion. Acute bronchitis should be considered likely in the proper clinical setting. There is no lung consolidation to suggest pneumonia and no evidence of pulmonary edema. No pleural effusion or pneumothorax. The bony thorax is grossly intact. IMPRESSION: 1. No evidence of pneumonia or pulmonary edema. 2. Bronchial wall thickening to the right lung base. This may reflect acute bronchitis. Electronically Signed   By: Lajean Manes M.D.   On: 03/22/2015 08:07   I have personally reviewed and evaluated these images and lab results as part of my medical decision-making.   EKG Interpretation   Date/Time:  Saturday March 22 2015 07:24:18 EST Ventricular Rate:  142 PR Interval:  127 QRS Duration: 99 QT Interval:  278 QTC Calculation: 427 R Axis:   63 Text Interpretation:  Sinus tachycardia Abnormal R-wave  progression, early  transition Probable inferior infarct, old ST elevation, consider anterior  injury Baseline wander in lead(s) V2 Confirmed by Ranisha Allaire  MD, Rafe Mackowski ([id number])  on 03/22/2015 8:01:42 AM     CRITICAL CARE Performed by: Nat Christen Total critical care time: 45 minutes Critical care time was exclusive of separately billable procedures and treating other patients. Critical care was necessary to treat or prevent imminent or life-threatening deterioration. Critical care was time spent personally by me on the following activities: development of treatment plan with patient and/or surrogate as well as nursing, discussions with consultants, evaluation of patient's response to treatment, examination of patient, obtaining history from patient or surrogate, ordering and performing treatments and interventions, ordering and review of laboratory studies, ordering and review of radiographic studies, pulse oximetry and re-evaluation of patient's condition. MDM   Final diagnoses:  COPD exacerbation ([place])   Patient presents with a profound COPD exacerbation. BiPAP was placed immediately which seems to help. Patient remains tachycardic which is most likely secondary to the beta effects of his breathing treatments. Patient has been rechecked multiple times. His breathing has become less labored. His is oxygenating well.   1045:   Discussed clinical scenario with cardiologist on-call at [facility]. Will admit to the hospital service. Baby aspirin administered.   Nat Christen, MD 03/22/15 [place], MD 03/22/15 [phone]

## 2015-03-22 NOTE — ED Notes (Signed)
AC called for ordered Coumadin.

## 2015-03-22 NOTE — Consult Note (Signed)
CARDIOLOGY INPATIENT CONSULTATION NOTE  Patient ID: Ryan Shea MRN: [id number], DOB/AGE: August 11, 1945   Admit date: 03/22/2015   Primary Physician: Bronson Curb, PA-C Primary Cardiologist: Woodroe Chen, NP  Reason for Consult:   Elevated troponin  Requesting Physician: Lucilla Edin MD  HPI: This is a 70 y.o. male with known history of paroxysmal atrial flutter with admission for GI bleed on anticoagulation therapy with Coumadin, moderate aortic valve stenosis, COPD, oxygen dependent at 2L, hypertension. He is recently admitted on 05/2014 had upper and GI bleed secondary to duodenal ulcers seen on EGD, continued on PPI.  Patient was in his usual state of health when he developed gradual worsening of SOB for the last 3 days. Patient also experienced some chest pain. The pain was sharp in character, 8/10, non radiating. The pain stayed for an hour. The chest pain started at 8 AM in the morning. He was sleeping at that time and he woke up with it. He asked his wife to call 911. He was taken to [facility] at that time.  Initial troponin was elevated in the ED and patient was treated with BiPAP for COPD exacerbation. EKG showed J point elevation in the inferor leads with T wave inversion in the leads V1-V3. Patient is now chest pain free on arrival and is on 2 l [place] only. He is being treated for CAP.   Problem List: Past Medical History  Diagnosis Date  . Asthma   . Hypertension   . COPD (chronic obstructive pulmonary disease) ([place])   . Cancer [facility])     Prostate   . Helicobacter pylori gastritis MAR 2016  . Multiple duodenal ulcers MAR 2016  . Atrial flutter ([place])   . Aortic valve stenosis   . Cardiomyopathy ([place])   . Mitral regurgitation   . Gout     Past Surgical History  Procedure Laterality Date  . Prostate surgery    . Hernia repair    . Esophagogastroduodenoscopy N/A 05/31/2014    H PYLORI GASTRITIS, DUODENAL ULCERS  . Colonoscopy    . Colonoscopy  N/A 08/25/2014    Procedure: COLONOSCOPY;  Surgeon: Rogene Houston, MD;  Location: [facility]O [facility];  Service: Endoscopy;  Laterality: N/A;     Allergies: No Known Allergies   Home Medications Current Facility-Administered Medications  Medication Dose Route Frequency Provider Last Rate Last Dose  . 0.9 %  sodium chloride infusion  250 mL Intravenous PRN Erline Hau, MD 10 mL/hr at 03/22/15 1537 250 mL at 03/22/15 1537  . acetaminophen (TYLENOL) tablet 650 mg  650 mg Oral Q6H PRN Erline Hau, MD       Or  . acetaminophen (TYLENOL) suppository 650 mg  650 mg Rectal Q6H PRN Erline Hau, MD      . albuterol (PROVENTIL) (2.5 MG/3ML) 0.083% nebulizer solution 2.5 mg  2.5 mg Nebulization Q2H PRN Erline Hau, MD      . aspirin chewable tablet 81 mg  81 mg Oral Daily Erline Hau, MD   81 mg at 03/22/15 1345  . azithromycin (ZITHROMAX) 500 mg in dextrose 5 % 250 mL IVPB  500 mg Intravenous Q24H Erline Hau, MD   Stopped at 03/22/15 1625  . carvedilol (COREG) tablet 6.25 mg  6.25 mg Oral BID WC Erline Hau, MD   6.25 mg at 03/22/15 1735  . cefTRIAXone (ROCEPHIN) 1 g in dextrose 5 % 50 mL  IVPB  1 g Intravenous Q24H Erline Hau, MD   Stopped at 03/22/15 1514  . lisinopril (PRINIVIL,ZESTRIL) tablet 5 mg  5 mg Oral Daily Estela Leonie Green, MD   5 mg at 03/22/15 1428  . methylPREDNISolone sodium succinate (SOLU-MEDROL) 125 mg/2 mL injection 80 mg  80 mg Intravenous 3 times per day Erline Hau, MD   80 mg at 03/22/15 1530  . mometasone-formoterol (DULERA) 100-5 MCG/ACT inhaler 2 puff  2 puff Inhalation BID Erline Hau, MD   2 puff at 03/22/15 1330  . morphine 2 MG/ML injection 1 mg  1 mg Intravenous Q4H PRN Erline Hau, MD      . ondansetron [facility]) tablet 4 mg  4 mg Oral Q6H PRN Erline Hau, MD       Or  . ondansetron [facility]) injection  4 mg  4 mg Intravenous Q6H PRN Erline Hau, MD      . pantoprazole (PROTONIX) EC tablet 40 mg  40 mg Oral Daily Erline Hau, MD   40 mg at 03/22/15 1458  . pravastatin (PRAVACHOL) tablet 40 mg  40 mg Oral QHS Erline Hau, MD      . senna-docusate (Senokot-S) tablet 1 tablet  1 tablet Oral QHS PRN Erline Hau, MD      . sodium chloride 0.9 % injection 3 mL  3 mL Intravenous Q12H Estela Leonie Green, MD      . sodium chloride 0.9 % injection 3 mL  3 mL Intravenous Q12H Estela Leonie Green, MD      . sodium chloride 0.9 % injection 3 mL  3 mL Intravenous PRN Erline Hau, MD      . tiotropium [facility]) inhalation capsule 18 mcg  18 mcg Inhalation Daily Erline Hau, MD   18 mcg at 03/22/15 1330  . [START ON 03/23/2015] Warfarin - Pharmacist Dosing Inpatient   Does not apply Q24H Estela Leonie Green, MD         Family History  Problem Relation Age of Onset  . Stroke Mother   . Cancer Brother   . Cancer Other   . Colon cancer Neg Hx   . Colon polyps Neg Hx      Social History   Social History  . Marital Status: Married    Spouse Name: N/A  . Number of Children: N/A  . Years of Education: N/A   Occupational History  . Not on file.   Social History Main Topics  . Smoking status: Former Smoker -- 2.00 packs/day for 20 years    Types: Cigarettes    Start date: 09/01/1959    Quit date: 09/01/1978  . Smokeless tobacco: Former Systems developer     Comment: quit 30 years ago  . Alcohol Use: No  . Drug Use: No  . Sexual Activity: Not on file   Other Topics Concern  . Not on file   Social History Narrative     Review of Systems: General: fatigue no chills or fevers Cardiovascular: dyspnea and chest pain  Dermatological: negative for rash Respiratory: dry cough negative for wheezing  Urologic: negative for hematuria Abdominal: negative for nausea, vomiting, diarrhea, bright red blood per  rectum, melena, or hematemesis Neurologic: negative for visual changes, syncope, or dizziness Hematology: history of anemia, easy bruising Psychiatry: non suicidal/homicidal  Musculoskeletal: back pain   Physical Exam: Vitals: BP 106/79 mmHg  Pulse 99  Temp(Src) 98.3 F (36.8 C) (Oral)  Resp 16  Ht 5\' 6"  (1.676 m)  Wt 108.8 kg (239 lb 13.8 oz)  BMI 38.73 kg/m2  SpO2 99% General: not in acute distress Neck: JVP flat, no thyromegaly Heart: regular rate and rhythm, S1, S2, no murmurs  Lungs: decreased air entry bilaterally GI: non tender, obese, bowel sounds present Extremities: no edema Neuro: AAO x 3  Psych: normal affect, no anxiety   Labs:   Results for orders placed or performed during the hospital encounter of 03/22/15 (from the past 24 hour(s))  CBC with Differential     Status: Abnormal   Collection Time: 03/22/15  7:49 AM  Result Value Ref Range   WBC 17.2 (H) 4.0 - 10.5 K/uL   RBC 3.58 (L) 4.22 - 5.81 MIL/uL   Hemoglobin 10.6 (L) 13.0 - 17.0 g/dL   HCT 32.8 (L) 39.0 - 52.0 %   MCV 91.6 78.0 - 100.0 fL   MCH 29.6 26.0 - 34.0 pg   MCHC 32.3 30.0 - 36.0 g/dL   RDW 14.4 11.5 - 15.5 %   Platelets 354 150 - 400 K/uL   Neutrophils Relative % 83 %   Neutro Abs 14.3 (H) 1.7 - 7.7 K/uL   Lymphocytes Relative 10 %   Lymphs Abs 1.7 0.7 - 4.0 K/uL   Monocytes Relative 7 %   Monocytes Absolute 1.1 (H) 0.1 - 1.0 K/uL   Eosinophils Relative 0 %   Eosinophils Absolute 0.1 0.0 - 0.7 K/uL   Basophils Relative 0 %   Basophils Absolute 0.0 0.0 - 0.1 K/uL  Comprehensive metabolic panel     Status: Abnormal   Collection Time: 03/22/15  7:49 AM  Result Value Ref Range   Sodium 139 135 - 145 mmol/L   Potassium 5.1 3.5 - 5.1 mmol/L   Chloride 106 101 - 111 mmol/L   CO2 25 22 - 32 mmol/L   Glucose, Bld 162 (H) 65 - 99 mg/dL   BUN 38 (H) 6 - 20 mg/dL   Creatinine, Ser 1.36 (H) 0.61 - 1.24 mg/dL   Calcium 8.5 (L) 8.9 - 10.3 mg/dL   Total Protein 6.8 6.5 - 8.1 g/dL   Albumin  3.1 (L) 3.5 - 5.0 g/dL   AST 15 15 - 41 U/L   ALT 17 17 - 63 U/L   Alkaline Phosphatase 62 38 - 126 U/L   Total Bilirubin 0.6 0.3 - 1.2 mg/dL   GFR calc non Af Amer 51 (L) >60 mL/min   GFR calc Af Amer 59 (L) >60 mL/min   Anion gap 8 5 - 15  Troponin I     Status: Abnormal   Collection Time: 03/22/15  7:49 AM  Result Value Ref Range   Troponin I 0.28 (H) <0.031 ng/mL  Culture, blood (routine x 2) Call MD if unable to obtain prior to antibiotics being given     Status: None (Preliminary result)   Collection Time: 03/22/15  2:04 PM  Result Value Ref Range   Specimen Description BLOOD LEFT HAND    Special Requests BOTTLES DRAWN AEROBIC AND ANAEROBIC 4CC EACH    Culture PENDING    Report Status PENDING   Culture, blood (routine x 2) Call MD if unable to obtain prior to antibiotics being given     Status: None (Preliminary result)   Collection Time: 03/22/15  2:23 PM  Result Value Ref Range   Specimen Description BLOOD RIGHT HAND    Special  Requests BOTTLES DRAWN AEROBIC AND ANAEROBIC 4CC    Culture PENDING    Report Status PENDING   Troponin I (q 6hr x 3)     Status: Abnormal   Collection Time: 03/22/15  2:23 PM  Result Value Ref Range   Troponin I 1.40 (HH) <0.031 ng/mL  Protime-INR     Status: Abnormal   Collection Time: 03/22/15  2:23 PM  Result Value Ref Range   Prothrombin Time 30.4 (H) 11.6 - 15.2 seconds   INR 2.97 (H) 0.00 - 1.49  Troponin I (q 6hr x 3)     Status: Abnormal   Collection Time: 03/22/15  7:56 PM  Result Value Ref Range   Troponin I 1.41 (HH) <0.031 ng/mL     Radiology/Studies: Dg Chest 2 View  03/02/2015  CLINICAL DATA:  Short of breath and congestion. EXAM: CHEST  2 VIEW COMPARISON:  01/06/2015 FINDINGS: Normal mediastinum and cardiac silhouette. Normal pulmonary vasculature. No evidence of effusion, infiltrate, or pneumothorax. No acute bony abnormality. Degenerative osteophytosis of the thoracic spine. IMPRESSION: No active cardiopulmonary disease.  Electronically Signed   By: Suzy Bouchard M.D.   On: 03/02/2015 19:09   Dg Chest Portable 1 View  03/22/2015  CLINICAL DATA:  Short of breath.  Tachycardia. EXAM: PORTABLE CHEST 1 VIEW COMPARISON:  03/02/2015. FINDINGS: Cardiac silhouette is normal in size. No mediastinal or hilar masses or convincing adenopathy. Study is degraded by motion. There is irregular bronchial wall thickening most evident in the right lung base. This may be chronic accentuated by motion. Acute bronchitis should be considered likely in the proper clinical setting. There is no lung consolidation to suggest pneumonia and no evidence of pulmonary edema. No pleural effusion or pneumothorax. The bony thorax is grossly intact. IMPRESSION: 1. No evidence of pneumonia or pulmonary edema. 2. Bronchial wall thickening to the right lung base. This may reflect acute bronchitis. Electronically Signed   By: Lajean Manes M.D.   On: 03/22/2015 08:07    EKG: sinus rhythm, J point elevation in the inferior leads, T wave inversions in the V1-V3  Echo: pending  Cardiac cath: none performed before  Medical decision making:  Discussed care with the patient Reviewed labs and imaging personally Reviewed prior records  ASSESSMENT AND PLAN:  This is a 70 y.o. male with known history of paroxysmal atrial flutter with admission for GI bleed on anticoagulation therapy with Coumadin, moderate aortic valve stenosis, COPD, oxygen dependent at 2L, hypertension who presented with NSTEMI.   Principal Problem:   Acute on chronic respiratory failure with hypoxia (HCC) Active Problems:   HTN (hypertension)   Aortic stenosis   Atrial flutter (HCC)   COPD exacerbation (HCC)   Demand ischemia (HCC)   CAP (community acquired pneumonia)   Leukocytosis   CKD (chronic kidney disease) stage 3, GFR 30-59 ml/min   Morbid obesity (HCC)   Acute on chronic respiratory failure with hypoxemia (HCC)  Chest pain Likely ischemic in etiology Consider  treating as NSTEMI, Cycle troponin, serial EKGs prn, lipid panel, TSH, HbA1c, echocardiogram in the AM IV heparin, aspirin, high dose statin (crestor 20 mg or lipitor 80 mg daily), Consider cardiac catheterization   Paroxysmal atrial flutter CHADS2VAsc score of 3 Continue warfarin  Hypertension  Consider placing hold parameters  Signed, Flossie Dibble, MD MS 03/22/2015, 10:36 PM

## 2015-03-22 NOTE — ED Provider Notes (Signed)
Pt continues to hold in the ED for admission/transfer to [facility]. 2nd troponin elevated. ASA already given. INR therapeutic.  T/C to [facility] Dr. Dana Allan, case discussed, including:  HPI, pertinent PM/SHx, VS/PE, dx testing, ED course and treatment:  She and previous EDP have discussed case with [facility] Cards MD.  T/C to [facility] Dr. Haroldine Laws, case discussed, including:  HPI, pertinent PM/SHx, VS/PE, dx testing, ED course and treatment:  States troponin elevation likely d/t demand, continue to tx underlying illness, no other treatment otherwise at this time.   Francine Graven, DO 03/22/15 [phone]

## 2015-03-22 NOTE — H&P (Addendum)
[facility]          History and Physical    PCP:   Bronson Curb, PA-C   EDP: Nat Christen, M.D.  Chief Complaint:  Shortness of breath  HPI: Patient is a 70 year old gentleman with multiple medical comorbidities including history of duodenal ulcers, hypertension, COPD on chronic oxygen, who presents to the hospital today with a 3 to four-day duration of shortness of breath. Unfortunately history is limited as patient is currently on BiPAP and no family members are available. He can however answer yes or no to my questions. He does say that he had some chest pain that started yesterday and points to the substernal area when asked where it hurts. He denies any radiation of the pain. His wife made him call EMS today given his severe shortness of breath. Upon arrival to the emergency department he was noted to have significant wheezing and was given several nebulizer treatments as well as steroids. Was placed on BiPAP given hypoxemia and work of breathing. Within his workup in the emergency department he was found to have leukocytosis, troponin of 0.28, chest x-ray that shows bronchial wall thickening of the right lung base that likely represents acute bronchitis. EKG that has been independently reviewed that initially showed a very rapid rate and what appear to be possible ST elevation in some lateral and inferior leads. EDP has discussed case with cardiologist at [facility], Dr. Haroldine Laws, who does not believe this represents an acute MI, however given severity of respiratory failure, possibility of MI and lack of cardiology or pulmonary coverage this weekend, it has been decided to transfer patient to [facility] for further care.  Allergies:  No Known Allergies    Past Medical History  Diagnosis Date  . Asthma   . Hypertension   . COPD (chronic obstructive pulmonary disease) ([place])   . Cancer [facility])     Prostate   . Helicobacter pylori gastritis MAR  2016  . Multiple duodenal ulcers MAR 2016  . Atrial flutter ([place])   . Aortic valve stenosis   . Cardiomyopathy ([place])   . Mitral regurgitation   . Gout     Past Surgical History  Procedure Laterality Date  . Prostate surgery    . Hernia repair    . Esophagogastroduodenoscopy N/A 05/31/2014    H PYLORI GASTRITIS, DUODENAL ULCERS  . Colonoscopy    . Colonoscopy N/A 08/25/2014    Procedure: COLONOSCOPY;  Surgeon: Rogene Houston, MD;  Location: [facility];  Service: Endoscopy;  Laterality: N/A;    Prior to Admission medications   Medication Sig Start Date End Date Taking? Authorizing Provider  albuterol (PROVENTIL) (2.5 MG/3ML) 0.083% nebulizer solution Take 3 mLs (2.5 mg total) by nebulization every 4 (four) hours as needed for wheezing or shortness of breath. 03/03/15  Yes Kathie Dike, MD  allopurinol (ZYLOPRIM) 100 MG tablet Take 1 tablet by mouth daily. 03/06/15  Yes Historical Provider, MD  carvedilol (COREG) 6.25 MG tablet Take 1 tablet (6.25 mg total) by mouth 2 (two) times daily with a meal. 05/23/13  Yes [name], MD  colchicine 0.6 MG tablet Take 1 tablet (0.6 mg total) by mouth daily. 03/03/15  Yes Kathie Dike, MD  ferrous sulfate 325 (65 FE) MG tablet Take 325 mg by mouth at bedtime.    Yes Historical Provider, MD  Fluticasone-Salmeterol (ADVAIR DISKUS) 250-50 MCG/DOSE AEPB Inhale 1  puff into the lungs daily. 08/25/14  Yes Kathie Dike, MD  gabapentin (NEURONTIN) 300 MG capsule Take 300 mg by mouth 3 (three) times daily as needed (for pain/neuropathy).  02/23/14  Yes Historical Provider, MD  guaifenesin (ROBITUSSIN) 100 MG/5ML syrup Take 100 mg by mouth 3 (three) times daily as needed for congestion.   Yes Historical Provider, MD  lisinopril (PRINIVIL,ZESTRIL) 5 MG tablet Take 5 mg by mouth daily.   Yes Historical Provider, MD  pantoprazole (PROTONIX) 40 MG tablet Take 1 tablet (40 mg total) by mouth daily. 08/25/14  Yes Kathie Dike, MD  pravastatin  (PRAVACHOL) 40 MG tablet Take 1 tablet by mouth at bedtime.  07/06/13  Yes Historical Provider, MD  predniSONE (DELTASONE) 10 MG tablet Take 50m po daily for 2 days then 350mdaily for 2 days then 2062maily for 2 days then 50m82mily then stop 03/03/15  Yes JehaKathie Dike  tiotropium (SPIRIVA HANDIHALER) 18 MCG inhalation capsule Place 1 capsule (18 mcg total) into inhaler and inhale daily. 03/03/15  Yes JehaKathie Dike  VENTOLIN HFA 108 (90 BASE) MCG/ACT inhaler Inhale 1 puff into the lungs every 6 (six) hours as needed for wheezing or shortness of breath.  05/10/14  Yes Historical Provider, MD  warfarin (COUMADIN) 3 MG tablet Take 1 tablet (3 mg total) by mouth See admin instructions. Take 4mg 54mMondays and Tuesdays.Take 3mg o44mll other days. Restart on 08/27/14 Patient taking differently: Take 3 mg by mouth daily at 6 PM.  08/25/14  Yes JehanzKathie Dikezolpidem (AMBIEN) 10 MG tablet Take 10 mg by mouth at bedtime.   Yes Historical Provider, MD  levofloxacin (LEVAQUIN) 500 MG tablet Take 1 tablet (500 mg total) by mouth daily. Patient not taking: Reported on 03/22/2015 03/03/15   JehanzKathie Dike  Social History:  reports that he quit smoking about 36 years ago. His smoking use included Cigarettes. He started smoking about 55 years ago. He has a 40 pack-year smoking history. He has quit using smokeless tobacco. He reports that he does not drink alcohol or use illicit drugs.  Family History  Problem Relation Age of Onset  . Stroke Mother   . Cancer Brother   . Cancer Other   . Colon cancer Neg Hx   . Colon polyps Neg Hx     Review of Systems:  Unable to obtain given patient currently on BiPAP  Physical Exam: Blood pressure 110/64, pulse 114, temperature 98.7 F (37.1 C), temperature source Axillary, resp. rate 19, height 5' 6"  (1.676 m), weight 112.492 kg (248 lb), SpO2 96 %. General: Alert, awake, oriented 3, on BiPAP, still with significant work of breathing HEENT:  Normocephalic, atraumatic, pupils equal round reactive to light, extraocular movements intact, moist mucous membranes Neck: Supple, no JVD, no lymphadenopathy, no bruits, no goiter. Cardiovascular: Tachycardic, regular, no murmurs auscultated Lungs: Mild bilateral expiratory wheezes, no rhonchi or crackles Abdomen: Obese, soft, nontender, nondistended Extremities: Trace bilateral pitting edema Neurologic: Moves all 4 spontaneously  Labs on Admission:  Results for orders placed or performed during the hospital encounter of 03/22/15 (from the past 48 hour(s))  CBC with Differential     Status: Abnormal   Collection Time: 03/22/15  7:49 AM  Result Value Ref Range   WBC 17.2 (H) 4.0 - 10.5 K/uL   RBC 3.58 (L) 4.22 - 5.81 MIL/uL   Hemoglobin 10.6 (L) 13.0 - 17.0 g/dL   HCT 32.8 (L) 39.0 - 52.0 %  MCV 91.6 78.0 - 100.0 fL   MCH 29.6 26.0 - 34.0 pg   MCHC 32.3 30.0 - 36.0 g/dL   RDW 14.4 11.5 - 15.5 %   Platelets 354 150 - 400 K/uL   Neutrophils Relative % 83 %   Neutro Abs 14.3 (H) 1.7 - 7.7 K/uL   Lymphocytes Relative 10 %   Lymphs Abs 1.7 0.7 - 4.0 K/uL   Monocytes Relative 7 %   Monocytes Absolute 1.1 (H) 0.1 - 1.0 K/uL   Eosinophils Relative 0 %   Eosinophils Absolute 0.1 0.0 - 0.7 K/uL   Basophils Relative 0 %   Basophils Absolute 0.0 0.0 - 0.1 K/uL  Comprehensive metabolic panel     Status: Abnormal   Collection Time: 03/22/15  7:49 AM  Result Value Ref Range   Sodium 139 135 - 145 mmol/L   Potassium 5.1 3.5 - 5.1 mmol/L   Chloride 106 101 - 111 mmol/L   CO2 25 22 - 32 mmol/L   Glucose, Bld 162 (H) 65 - 99 mg/dL   BUN 38 (H) 6 - 20 mg/dL   Creatinine, Ser 1.36 (H) 0.61 - 1.24 mg/dL   Calcium 8.5 (L) 8.9 - 10.3 mg/dL   Total Protein 6.8 6.5 - 8.1 g/dL   Albumin 3.1 (L) 3.5 - 5.0 g/dL   AST 15 15 - 41 U/L   ALT 17 17 - 63 U/L   Alkaline Phosphatase 62 38 - 126 U/L   Total Bilirubin 0.6 0.3 - 1.2 mg/dL   GFR calc non Af Amer 51 (L) >60 mL/min   GFR calc Af Amer 59 (L)  >60 mL/min    Comment: (NOTE) The eGFR has been calculated using the CKD EPI equation. This calculation has not been validated in all clinical situations. eGFR's persistently <60 mL/min signify possible Chronic Kidney Disease.    Anion gap 8 5 - 15  Troponin I     Status: Abnormal   Collection Time: 03/22/15  7:49 AM  Result Value Ref Range   Troponin I 0.28 (H) <0.031 ng/mL    Comment:        PERSISTENTLY INCREASED TROPONIN VALUES IN THE RANGE OF 0.04-0.49 ng/mL CAN BE SEEN IN:       -UNSTABLE ANGINA       -CONGESTIVE HEART FAILURE       -MYOCARDITIS       -CHEST TRAUMA       -ARRYHTHMIAS       -LATE PRESENTING MYOCARDIAL INFARCTION       -COPD   CLINICAL FOLLOW-UP RECOMMENDED.     Radiological Exams on Admission: Dg Chest Portable 1 View  03/22/2015  CLINICAL DATA:  Short of breath.  Tachycardia. EXAM: PORTABLE CHEST 1 VIEW COMPARISON:  03/02/2015. FINDINGS: Cardiac silhouette is normal in size. No mediastinal or hilar masses or convincing adenopathy. Study is degraded by motion. There is irregular bronchial wall thickening most evident in the right lung base. This may be chronic accentuated by motion. Acute bronchitis should be considered likely in the proper clinical setting. There is no lung consolidation to suggest pneumonia and no evidence of pulmonary edema. No pleural effusion or pneumothorax. The bony thorax is grossly intact. IMPRESSION: 1. No evidence of pneumonia or pulmonary edema. 2. Bronchial wall thickening to the right lung base. This may reflect acute bronchitis. Electronically Signed   By: Lajean Manes M.D.   On: 03/22/2015 08:07    Assessment/Plan Principal Problem:   Acute on chronic respiratory failure  with hypoxia ([place]) Active Problems:   COPD exacerbation (HCC)   HTN (hypertension)   Aortic stenosis   Atrial flutter ([place])   Demand ischemia (HCC)   CAP (community acquired pneumonia)   Leukocytosis   CKD (chronic kidney disease) stage 3, GFR 30-59  ml/min   Morbid obesity (HCC)   Acute on chronic respiratory failure with hypoxemia (HCC)     Acute on chronic respiratory failure with hypoxemia -Chronic component due to COPD, acute component due to possibly pneumonia/bronchitis as well as exacerbation of COPD. -Admit to stepdown unit, continuous BiPAP for now.  COPD with acute exacerbation -We'll place on steroids, frequent nebs, continue home doses of long acting beta agonists and Spiriva.  Acute bronchitis/pneumonia -No reason to believe he has any multidrug resistant pathogens. -We'll place on Rocephin/azithromycin. -Blood and sputum cultures requested. -Strep pneumo and legionella urine antigens requested. -We'll check influenza PCR.  Elevated troponins -Likely represents demand ischemia, altho possibly NSTEMI. -EKG has been evaluated by Dr. Haroldine Laws who does not believe this represents a STEMI. -We'll trend troponins, may benefit from 2-D echo if troponins continue to rise, start aspirin (has history of duodenal ulcer so we'll need to monitor closely for GI bleed especially in the presence of warfarin). -Cardiology consultation upon arrival to [facility].  Chronic kidney disease stage III -Creatinine appears to be at baseline  Leukocytosis -Likely secondary to acute respiratory illness, monitor.  Morbid obesity -Noted  Atrial flutter -Rates likely exacerbated by beta agonists. -Continue Coumadin.  History of moderate aortic stenosis -Careful with fluid administration.  DVT prophylaxis -On Lovenox  CODE STATUS -Full code    Time Spent on Admission: 95 minutes  HERNANDEZ ACOSTA,ESTELA [facility] Pager: [phone] 03/22/2015, 1:25 PM

## 2015-03-22 NOTE — ED Notes (Signed)
X-ray at bedside

## 2015-03-22 NOTE — ED Notes (Addendum)
Per EMS, pt from home woke up with worsening SOB this morning. Pt reports cough and SOB over the past few weeks, but it has progressively gotten worse. Pt reports coughing up thick, green sputum. Pt hx of COPD. Pt took one albuterol tx prior to EMS arrival. AOx4. Denies CP.

## 2015-03-22 NOTE — Progress Notes (Signed)
ANTICOAGULATION CONSULT NOTE - Initial Consult  Pharmacy Consult for Coumadin Indication: atrial fibrillation  No Known Allergies  Patient Measurements: Height: 5\' 6"  (167.6 cm) Weight: 248 lb (112.492 kg) IBW/kg (Calculated) : 63.8  Vital Signs: Temp: 98.7 F (37.1 C) (01/14 0725) Temp Source: Axillary (01/14 0725) BP: 108/79 mmHg (01/14 1531) Pulse Rate: 105 (01/14 1531)  Labs:  Recent Labs  03/22/15 0749 03/22/15 1423  HGB 10.6*  --   HCT 32.8*  --   PLT 354  --   LABPROT  --  30.4*  INR  --  2.97*  CREATININE 1.36*  --   TROPONINI 0.28* 1.40*    Estimated Creatinine Clearance: 59.5 mL/min (by C-G formula based on Cr of 1.36).   Medical History: Past Medical History  Diagnosis Date  . Asthma   . Hypertension   . COPD (chronic obstructive pulmonary disease) ([place])   . Cancer [facility])     Prostate   . Helicobacter pylori gastritis MAR 2016  . Multiple duodenal ulcers MAR 2016  . Atrial flutter ([place])   . Aortic valve stenosis   . Cardiomyopathy ([place])   . Mitral regurgitation   . Gout     Medications:   (Not in a hospital admission)  Assessment: 70yo male on chronic coumadin.  INR therapeutic.   Goal of Therapy:  INR 2-3 Monitor platelets by anticoagulation protocol: Yes   Plan:  Coumadin 3mg  today  Hart Robinsons A 03/22/2015,4:01 PM

## 2015-03-22 NOTE — ED Notes (Signed)
CRITICAL VALUE ALERT  Critical value received:  Troponin 1.4  Date of notification:  03/22/2015  Time of notification:  [id number]  Critical value read back:Yes.    Nurse who received alert:  Domenica Reamer RN   MD notified (1st page):  Dr Thurnell Garbe  Time of first page:  1517  MD notified (2nd page):  Time of second page:  Responding MD:  Dr Thurnell Garbe  Time MD responded:  [phone]

## 2015-03-22 NOTE — Progress Notes (Signed)
Patient arrived via [facility] on BiPap.  Patient removed from [place] and placed on NRB, sats 100%, RR 18.  Patient denies SOB at this time.  02 weaned to 4L [place], sats 99%.

## 2015-03-22 NOTE — ED Notes (Signed)
Pt given 5 albuterol, 1 duoneb, and 125 solumedrol IM by EMS.

## 2015-03-23 DIAGNOSIS — I350 Nonrheumatic aortic (valve) stenosis: Secondary | ICD-10-CM

## 2015-03-23 DIAGNOSIS — J9621 Acute and chronic respiratory failure with hypoxia: Secondary | ICD-10-CM

## 2015-03-23 DIAGNOSIS — D72829 Elevated white blood cell count, unspecified: Secondary | ICD-10-CM

## 2015-03-23 DIAGNOSIS — I248 Other forms of acute ischemic heart disease: Secondary | ICD-10-CM

## 2015-03-23 DIAGNOSIS — I4892 Unspecified atrial flutter: Secondary | ICD-10-CM

## 2015-03-23 DIAGNOSIS — J441 Chronic obstructive pulmonary disease with (acute) exacerbation: Secondary | ICD-10-CM

## 2015-03-23 DIAGNOSIS — N183 Chronic kidney disease, stage 3 (moderate): Secondary | ICD-10-CM

## 2015-03-23 LAB — CBC
HEMATOCRIT: 28.1 % — AB (ref 39.0–52.0)
HEMOGLOBIN: 9.4 g/dL — AB (ref 13.0–17.0)
MCH: 30.3 pg (ref 26.0–34.0)
MCHC: 33.5 g/dL (ref 30.0–36.0)
MCV: 90.6 fL (ref 78.0–100.0)
Platelets: 240 10*3/uL (ref 150–400)
RBC: 3.1 MIL/uL — ABNORMAL LOW (ref 4.22–5.81)
RDW: 14.3 % (ref 11.5–15.5)
WBC: 13 10*3/uL — ABNORMAL HIGH (ref 4.0–10.5)

## 2015-03-23 LAB — INFLUENZA PANEL BY PCR (TYPE A & B)
H1N1 flu by pcr: NOT DETECTED
INFLAPCR: NEGATIVE
INFLBPCR: NEGATIVE

## 2015-03-23 LAB — BASIC METABOLIC PANEL
Anion gap: 7 (ref 5–15)
BUN: 27 mg/dL — AB (ref 6–20)
CHLORIDE: 104 mmol/L (ref 101–111)
CO2: 27 mmol/L (ref 22–32)
CREATININE: 0.92 mg/dL (ref 0.61–1.24)
Calcium: 8.6 mg/dL — ABNORMAL LOW (ref 8.9–10.3)
GFR calc Af Amer: >60 mL/min (ref >60)
GFR calc non Af Amer: >60 mL/min (ref >60)
GLUCOSE: 146 mg/dL — AB (ref 65–99)
POTASSIUM: 4.8 mmol/L (ref 3.5–5.1)
SODIUM: 138 mmol/L (ref 135–145)

## 2015-03-23 LAB — EXPECTORATED SPUTUM ASSESSMENT W GRAM STAIN, RFLX TO RESP C

## 2015-03-23 LAB — PROTIME-INR
INR: 3.36 — AB (ref 0.00–1.49)
Prothrombin Time: 33.3 seconds — ABNORMAL HIGH (ref 11.6–15.2)

## 2015-03-23 LAB — STREP PNEUMONIAE URINARY ANTIGEN: Strep Pneumo Urinary Antigen: POSITIVE — AB

## 2015-03-23 LAB — TROPONIN I
Troponin I: 0.84 ng/mL (ref <0.031)
Troponin I: 0.77 ng/mL (ref <0.031)

## 2015-03-23 LAB — HIV ANTIBODY (ROUTINE TESTING W REFLEX): HIV Screen 4th Generation wRfx: NONREACTIVE

## 2015-03-23 LAB — MRSA PCR SCREENING: MRSA by PCR: NEGATIVE

## 2015-03-23 NOTE — Progress Notes (Signed)
TELEMETRY: Reviewed telemetry pt in NSR: Filed Vitals:   03/22/15 2303 03/23/15 0403 03/23/15 0756 03/23/15 0909  BP: 90/57 99/59 120/68 116/60  Pulse: 92 89 95   Temp: 98.2 F (36.8 C) 97.9 F (36.6 C) 98.4 F (36.9 C)   TempSrc: Oral Oral Oral   Resp: 17 17 21    Height:      Weight:      SpO2: 98% 96% 100%     Intake/Output Summary (Last 24 hours) at 03/23/15 1023 Last data filed at 03/23/15 0900  Gross per 24 hour  Intake    540 ml  Output   1000 ml  Net   -460 ml   Filed Weights   03/22/15 0721 03/22/15 2122  Weight: 112.492 kg (248 lb) 108.8 kg (239 lb 13.8 oz)    Subjective Feels much better today. Breathing improved. No further chest pain.  Marland Kitchen aspirin  81 mg Oral Daily  . azithromycin  500 mg Intravenous Q24H  . carvedilol  6.25 mg Oral BID WC  . cefTRIAXone (ROCEPHIN)  IV  1 g Intravenous Q24H  . lisinopril  5 mg Oral Daily  . methylPREDNISolone (SOLU-MEDROL) injection  80 mg Intravenous 3 times per day  . mometasone-formoterol  2 puff Inhalation BID  . pantoprazole  40 mg Oral Daily  . pravastatin  40 mg Oral QHS  . sodium chloride  3 mL Intravenous Q12H  . sodium chloride  3 mL Intravenous Q12H  . tiotropium  18 mcg Inhalation Daily  . Warfarin - Pharmacist Dosing Inpatient   Does not apply Q24H      LABS: Basic Metabolic Panel:  Recent Labs  03/22/15 0749 03/23/15 0340  NA 139 138  K 5.1 4.8  CL 106 104  CO2 25 27  GLUCOSE 162* 146*  BUN 38* 27*  CREATININE 1.36* 0.92  CALCIUM 8.5* 8.6*   Liver Function Tests:  Recent Labs  03/22/15 0749  AST 15  ALT 17  ALKPHOS 62  BILITOT 0.6  PROT 6.8  ALBUMIN 3.1*   No results for input(s): LIPASE, AMYLASE in the last 72 hours. CBC:  Recent Labs  03/22/15 0749 03/23/15 0340  WBC 17.2* 13.0*  NEUTROABS 14.3*  --   HGB 10.6* 9.4*  HCT 32.8* 28.1*  MCV 91.6 90.6  PLT 354 240   Cardiac Enzymes:  Recent Labs  03/22/15 1956 03/22/15 2210 03/23/15 0340  TROPONINI 1.41* 1.25*  0.84*   BNP: No results for input(s): PROBNP in the last 72 hours. D-Dimer: No results for input(s): DDIMER in the last 72 hours. Hemoglobin A1C: No results for input(s): HGBA1C in the last 72 hours. Fasting Lipid Panel: No results for input(s): CHOL, HDL, LDLCALC, TRIG, CHOLHDL, LDLDIRECT in the last 72 hours. Thyroid Function Tests: No results for input(s): TSH, T4TOTAL, T3FREE, THYROIDAB in the last 72 hours.  Invalid input(s): FREET3   Radiology/Studies:  Dg Chest Portable 1 View  03/22/2015  CLINICAL DATA:  Short of breath.  Tachycardia. EXAM: PORTABLE CHEST 1 VIEW COMPARISON:  03/02/2015. FINDINGS: Cardiac silhouette is normal in size. No mediastinal or hilar masses or convincing adenopathy. Study is degraded by motion. There is irregular bronchial wall thickening most evident in the right lung base. This may be chronic accentuated by motion. Acute bronchitis should be considered likely in the proper clinical setting. There is no lung consolidation to suggest pneumonia and no evidence of pulmonary edema. No pleural effusion or pneumothorax. The bony thorax is grossly intact. IMPRESSION: 1. No  evidence of pneumonia or pulmonary edema. 2. Bronchial wall thickening to the right lung base. This may reflect acute bronchitis. Electronically Signed   By: Lajean Manes M.D.   On: 03/22/2015 08:07   Ecg shows NSR with early transition and T wave inversion in septal leads. ? Posterior infarct.  PHYSICAL EXAM General: Well developed, obese, in no acute distress. Head: Normocephalic, atraumatic, sclera non-icteric, oropharynx is clear Neck: Negative for carotid bruits. JVD not elevated. No adenopathy Lungs: Bilateral coarse rhonchi. Few wheezes. Breathing is unlabored. Heart: RRR S1 S2 without murmurs, rubs, or gallops.  Abdomen: Soft, non-tender, non-distended with normoactive bowel sounds.  Msk:  Strength and tone appears normal for age. Extremities: No clubbing, cyanosis or edema.  Distal  pedal pulses are 2+ and equal bilaterally. Neuro: Alert and oriented X 3. Moves all extremities spontaneously. Psych:  Responds to questions appropriately with a normal affect.  ASSESSMENT AND PLAN: 1. NSTEMI versus demand ischemia in setting of acute bronchitis. Troponin peak 1.41. Echo raises concerns of posterior infarct. Last ischemic work up in 2/15 with normal Myoview study. No known history of CAD. Patient presented with acute chest pain. I would recommend cardiac cath to assess risk and treatment. The procedure and risks were reviewed including but not limited to death, myocardial infarction, stroke, arrythmias, bleeding, transfusion, emergency surgery, dye allergy, or renal dysfunction. The patient voices understanding and is agreeable to proceed. Will need to hold coumadin and plan cardiac cath when INR less than 1.8. Echo pending.  2. Acute bronchitis. + strep pneumo. antibx per primary team.  3. History of atrial flutter. Now in NSR. 4. COPD  5. History of GI bleed with duodenal ulcers Mar. 2016.  6. Moderate aortic stenosis. 7. HTN  Present on Admission:  . COPD exacerbation ([place]) . HTN (hypertension) . Atrial flutter ([place]) . Acute on chronic respiratory failure with hypoxemia ([place])  Signed, Peter Martinique, [place] 03/23/2015 10:23 AM

## 2015-03-23 NOTE — Progress Notes (Signed)
Utilization review completed.  

## 2015-03-23 NOTE — Progress Notes (Signed)
[facility]                                                                              Patient Demographics  Ryan Shea, is a 70 y.o. male, DOB - [birth year], LV:[id number]  Admit date - 03/22/2015   Admitting Physician Erline Hau, MD  Outpatient Primary MD for the patient is [name], [name], PA-C  LOS - 1   Chief Complaint  Patient presents with  . Shortness of Breath      HPI on 03/22/2015 by Dr. Rande Lawman Patient is a 70 year old gentleman with multiple medical comorbidities including history of duodenal ulcers, hypertension, COPD on chronic oxygen, who presents to the hospital today with a 3 to four-day duration of shortness of breath. Unfortunately history is limited as patient is currently on BiPAP and no family members are available. He can however answer yes or no to my questions. He does say that he had some chest pain that started yesterday and points to the substernal area when asked where it hurts. He denies any radiation of the pain. His wife made him call EMS today given his severe shortness of breath. Upon arrival to the emergency department he was noted to have significant wheezing and was given several nebulizer treatments as well as steroids. Was placed on BiPAP given hypoxemia and work of breathing. Within his workup in the emergency department he was found to have leukocytosis, troponin of 0.28, chest x-ray that shows bronchial wall thickening of the right lung base that likely represents acute bronchitis. EKG that has been independently reviewed that initially showed a very rapid rate and what appear to be possible ST elevation in some lateral and inferior leads. EDP has discussed case with cardiologist at [facility], Dr. Haroldine Laws, who does not believe this represents an acute MI, however given severity of respiratory failure, possibility of MI and lack of cardiology or pulmonary coverage this weekend, it has been decided to transfer  patient to [facility] for further care.  Assessment & Plan   Elevated troponins -Likely represents demand ischemia vs NSTEMI -Cardiology consulted and appreciated -Echocardiogram pending -Peak tropoinin 1.41, trending downward -Plan for cardiac cath once INR less than 1.8  Acute on chronic respiratory failure with hypoxemia/COPD Exac -Chronic component due to COPD, acute component due to possibly pneumonia/bronchitis as well as exacerbation of COPD. -Continue nebs, steroids, Spiriva   Acute bronchitis/pneumonia -Continue Rocephin/azithromycin. -Blood cultures show no growth to date -Sputum culture pending -Strep pneumo urine antigen positive -Influenza PCR negative -CXR shows no evidence of pna or pulm edema  Chronic kidney disease stage III -Creatinine appears to be at baseline  Leukocytosis -Likely secondary to acute respiratory illness, monitor.  Morbid obesity -Patient will need outpatient follow up with PCP to discuss lifestyle modifications  Atrial flutter -Currently in SR -Coumadin on hold   History of moderate aortic stenosis -monitor   History of duodenal ulcers/GI Bleed -March 2016 -Monitor CBC  Code Status: Full  Family Communication: None at bedside  Disposition Plan: Admitted, pending further recommendations from cardiology  Time Spent in minutes   30 minutes  Procedures  None  Consults   Cardiology  DVT Prophylaxis  Coumadin (held)  Lab Results  Component Value Date   PLT 240 03/23/2015    Medications  Scheduled Meds: . aspirin  81 mg Oral Daily  . azithromycin  500 mg Intravenous Q24H  . carvedilol  6.25 mg Oral BID WC  . cefTRIAXone (ROCEPHIN)  IV  1 g Intravenous Q24H  . lisinopril  5 mg Oral Daily  . methylPREDNISolone (SOLU-MEDROL) injection  80 mg Intravenous 3 times per day  . mometasone-formoterol  2 puff Inhalation BID  . pantoprazole  40 mg Oral Daily  . pravastatin  40 mg Oral QHS  . sodium chloride  3 mL  Intravenous Q12H  . sodium chloride  3 mL Intravenous Q12H  . tiotropium  18 mcg Inhalation Daily   Continuous Infusions:  PRN Meds:.sodium chloride, acetaminophen **OR** acetaminophen, albuterol, morphine injection, ondansetron **OR** ondansetron (ZOFRAN) IV, senna-docusate, sodium chloride  Antibiotics   Anti-infectives    Start     Dose/Rate Route Frequency Ordered Stop   03/22/15 1330  cefTRIAXone (ROCEPHIN) 1 g in dextrose 5 % 50 mL IVPB     1 g 100 mL/hr over 30 Minutes Intravenous Every 24 hours 03/22/15 1325 03/29/15 1329   03/22/15 1330  azithromycin (ZITHROMAX) 500 mg in dextrose 5 % 250 mL IVPB     500 mg 250 mL/hr over 60 Minutes Intravenous Every 24 hours 03/22/15 1325 03/29/15 1329      Subjective:   [name] seen and examined today. Patient denies current chest pain.  Feels his breathing has improved.  Denies abdominal pain, N/V.  Continues to have cough.    Objective:   Filed Vitals:   03/23/15 0403 03/23/15 0756 03/23/15 0909 03/23/15 1158  BP: 99/59 120/68 116/60 110/54  Pulse: 89 95  95  Temp: 97.9 F (36.6 C) 98.4 F (36.9 C)    TempSrc: Oral Oral    Resp: 17 21    Height:      Weight:      SpO2: 96% 100%  97%    Wt Readings from Last 3 Encounters:  03/22/15 108.8 kg (239 lb 13.8 oz)  03/02/15 107.276 kg (236 lb 8 oz)  12/06/14 106.595 kg (235 lb)     Intake/Output Summary (Last 24 hours) at 03/23/15 1322 Last data filed at 03/23/15 0900  Gross per 24 hour  Intake    540 ml  Output   1000 ml  Net   -460 ml    Exam  General: Well developed, well nourished, NAD, appears stated age  24: NCAT,  mucous membranes moist.   Cardiovascular: S1 S2 auscultated, RRR, no murmurs  Respiratory: Diffuse expiratory wheezing  Abdomen: Soft, obese, nontender, nondistended, + bowel sounds  Extremities: warm dry without cyanosis clubbing or edema  Neuro: AAOx3, nonfocal   Psych: Normal affect and demeanor   Data Review   Micro  Results Recent Results (from the past 240 hour(s))  Culture, blood (routine x 2) Call MD if unable to obtain prior to antibiotics being given     Status: None (Preliminary result)   Collection Time: 03/22/15  2:04 PM  Result Value Ref Range Status   Specimen Description BLOOD LEFT HAND  Final   Special Requests BOTTLES DRAWN AEROBIC AND ANAEROBIC 4CC EACH  Final   Culture NO GROWTH < 24 HOURS  Final   Report Status PENDING  Incomplete  Culture, blood (routine x 2) Call MD if unable to obtain prior to antibiotics being given     Status: None (Preliminary  result)   Collection Time: 03/22/15  2:23 PM  Result Value Ref Range Status   Specimen Description BLOOD RIGHT HAND  Final   Special Requests BOTTLES DRAWN AEROBIC AND ANAEROBIC 4CC  Final   Culture NO GROWTH < 24 HOURS  Final   Report Status PENDING  Incomplete  MRSA PCR Screening     Status: None   Collection Time: 03/22/15 11:08 PM  Result Value Ref Range Status   MRSA by PCR NEGATIVE NEGATIVE Final    Comment:        The GeneXpert MRSA Assay (FDA approved for NASAL specimens only), is one component of a comprehensive MRSA colonization surveillance program. It is not intended to diagnose MRSA infection nor to guide or monitor treatment for MRSA infections.   Culture, sputum-assessment     Status: None   Collection Time: 03/23/15  9:12 AM  Result Value Ref Range Status   Specimen Description SPUTUM  Final   Special Requests NONE  Final   Sputum evaluation   Final    THIS SPECIMEN IS ACCEPTABLE. RESPIRATORY CULTURE REPORT TO FOLLOW.   Report Status 03/23/2015 FINAL  Final    Radiology Reports Dg Chest 2 View  03/02/2015  CLINICAL DATA:  Short of breath and congestion. EXAM: CHEST  2 VIEW COMPARISON:  01/06/2015 FINDINGS: Normal mediastinum and cardiac silhouette. Normal pulmonary vasculature. No evidence of effusion, infiltrate, or pneumothorax. No acute bony abnormality. Degenerative osteophytosis of the thoracic spine.  IMPRESSION: No active cardiopulmonary disease. Electronically Signed   By: Suzy Bouchard M.D.   On: 03/02/2015 19:09   Dg Chest Portable 1 View  03/22/2015  CLINICAL DATA:  Short of breath.  Tachycardia. EXAM: PORTABLE CHEST 1 VIEW COMPARISON:  03/02/2015. FINDINGS: Cardiac silhouette is normal in size. No mediastinal or hilar masses or convincing adenopathy. Study is degraded by motion. There is irregular bronchial wall thickening most evident in the right lung base. This may be chronic accentuated by motion. Acute bronchitis should be considered likely in the proper clinical setting. There is no lung consolidation to suggest pneumonia and no evidence of pulmonary edema. No pleural effusion or pneumothorax. The bony thorax is grossly intact. IMPRESSION: 1. No evidence of pneumonia or pulmonary edema. 2. Bronchial wall thickening to the right lung base. This may reflect acute bronchitis. Electronically Signed   By: Lajean Manes M.D.   On: 03/22/2015 08:07    CBC  Recent Labs Lab 03/22/15 0749 03/23/15 0340  WBC 17.2* 13.0*  HGB 10.6* 9.4*  HCT 32.8* 28.1*  PLT 354 240  MCV 91.6 90.6  MCH 29.6 30.3  MCHC 32.3 33.5  RDW 14.4 14.3  LYMPHSABS 1.7  --   MONOABS 1.1*  --   EOSABS 0.1  --   BASOSABS 0.0  --     Chemistries   Recent Labs Lab 03/22/15 0749 03/23/15 0340  NA 139 138  K 5.1 4.8  CL 106 104  CO2 25 27  GLUCOSE 162* 146*  BUN 38* 27*  CREATININE 1.36* 0.92  CALCIUM 8.5* 8.6*  AST 15  --   ALT 17  --   ALKPHOS 62  --   BILITOT 0.6  --    ------------------------------------------------------------------------------------------------------------------ estimated creatinine clearance is 86.4 mL/min (by C-G formula based on Cr of 0.92). ------------------------------------------------------------------------------------------------------------------ No results for input(s): HGBA1C in the last 72  hours. ------------------------------------------------------------------------------------------------------------------ No results for input(s): CHOL, HDL, LDLCALC, TRIG, CHOLHDL, LDLDIRECT in the last 72 hours. ------------------------------------------------------------------------------------------------------------------ No results for input(s): TSH, T4TOTAL, T3FREE,  THYROIDAB in the last 72 hours.  Invalid input(s): FREET3 ------------------------------------------------------------------------------------------------------------------ No results for input(s): VITAMINB12, FOLATE, FERRITIN, TIBC, IRON, RETICCTPCT in the last 72 hours.  Coagulation profile  Recent Labs Lab 03/22/15 1423 03/23/15 0340  INR 2.97* 3.36*    No results for input(s): DDIMER in the last 72 hours.  Cardiac Enzymes  Recent Labs Lab 03/22/15 2210 03/23/15 0340 03/23/15 1000  TROPONINI 1.25* 0.84* 0.77*   ------------------------------------------------------------------------------------------------------------------ Invalid input(s): POCBNP    Lativia Velie D.O. on 03/23/2015 at 1:22 PM  Between 7am to 7pm - Pager - [phone]  After 7pm go to [email or web address] - password [email or web address]  And look for the night coverage person covering for me after hours  [facility] Office  [phone]

## 2015-03-24 ENCOUNTER — Inpatient Hospital Stay (HOSPITAL_COMMUNITY): Payer: Medicare PPO

## 2015-03-24 DIAGNOSIS — I214 Non-ST elevation (NSTEMI) myocardial infarction: Principal | ICD-10-CM

## 2015-03-24 DIAGNOSIS — I2510 Atherosclerotic heart disease of native coronary artery without angina pectoris: Secondary | ICD-10-CM

## 2015-03-24 LAB — CBC
HEMATOCRIT: 30.3 % — AB (ref 39.0–52.0)
HEMOGLOBIN: 9.7 g/dL — AB (ref 13.0–17.0)
MCH: 28.8 pg (ref 26.0–34.0)
MCHC: 32 g/dL (ref 30.0–36.0)
MCV: 89.9 fL (ref 78.0–100.0)
Platelets: 296 10*3/uL (ref 150–400)
RBC: 3.37 MIL/uL — AB (ref 4.22–5.81)
RDW: 14 % (ref 11.5–15.5)
WBC: 19.5 10*3/uL — ABNORMAL HIGH (ref 4.0–10.5)

## 2015-03-24 LAB — PROTIME-INR
INR: 4.23 — AB (ref 0.00–1.49)
Prothrombin Time: 39.6 seconds — ABNORMAL HIGH (ref 11.6–15.2)

## 2015-03-24 LAB — BASIC METABOLIC PANEL
Anion gap: 6 (ref 5–15)
BUN: 32 mg/dL — ABNORMAL HIGH (ref 6–20)
CHLORIDE: 101 mmol/L (ref 101–111)
CO2: 29 mmol/L (ref 22–32)
CREATININE: 1.15 mg/dL (ref 0.61–1.24)
Calcium: 8.5 mg/dL — ABNORMAL LOW (ref 8.9–10.3)
GFR calc non Af Amer: >60 mL/min (ref >60)
Glucose, Bld: 141 mg/dL — ABNORMAL HIGH (ref 65–99)
POTASSIUM: 5.1 mmol/L (ref 3.5–5.1)
SODIUM: 136 mmol/L (ref 135–145)

## 2015-03-24 LAB — LEGIONELLA ANTIGEN, URINE

## 2015-03-24 MED ORDER — METHYLPREDNISOLONE SODIUM SUCC 125 MG IJ SOLR
60.0000 mg | Freq: Two times a day (BID) | INTRAMUSCULAR | Status: DC
Start: 2015-03-24 — End: 2015-03-25
  Administered 2015-03-24 – 2015-03-25 (×2): 60 mg via INTRAVENOUS
  Filled 2015-03-24 (×2): qty 2

## 2015-03-24 MED ORDER — PERFLUTREN LIPID MICROSPHERE
1.0000 mL | INTRAVENOUS | Status: AC | PRN
  Administered 2015-03-24: 3 mL via INTRAVENOUS
  Filled 2015-03-24: qty 10

## 2015-03-24 MED ORDER — PHYTONADIONE 5 MG PO TABS
5.0000 mg | ORAL_TABLET | Freq: Once | ORAL | Status: AC
  Administered 2015-03-24: 5 mg via ORAL
  Filled 2015-03-24: qty 1

## 2015-03-24 NOTE — Progress Notes (Signed)
Echocardiogram 2D Echocardiogram has been performed.  Joelene Millin 03/24/2015, 1:56 PM

## 2015-03-24 NOTE — Progress Notes (Signed)
SUBJECTIVE:  Breathing is not yet at baseline but it is improved.  No chest pain   PHYSICAL EXAM Filed Vitals:   03/23/15 1935 03/23/15 2105 03/23/15 2310 03/24/15 0325  BP: 109/57  101/60 102/51  Pulse: 98  84 84  Temp: 97.9 F (36.6 C)  98.4 F (36.9 C) 97.4 F (36.3 C)  TempSrc: Oral  Oral Oral  Resp: 19  14 22   Height:      Weight:      SpO2: 99% 98% 97% 97%   General:  No acute distress Lungs:  Decreased breath sounds Heart:  RRR Abdomen:  Positive bowel sounds, no rebound no guarding Extremities:  No edema   LABS: Lab Results  Component Value Date   TROPONINI 0.77* 03/23/2015   Results for orders placed or performed during the hospital encounter of 03/22/15 (from the past 24 hour(s))  Culture, sputum-assessment     Status: None   Collection Time: 03/23/15  9:12 AM  Result Value Ref Range   Specimen Description SPUTUM    Special Requests NONE    Sputum evaluation      THIS SPECIMEN IS ACCEPTABLE. RESPIRATORY CULTURE REPORT TO FOLLOW.   Report Status 03/23/2015 FINAL   Troponin I (q 6hr x 3)     Status: Abnormal   Collection Time: 03/23/15 10:00 AM  Result Value Ref Range   Troponin I 0.77 (HH) <0.031 ng/mL  Protime-INR     Status: Abnormal   Collection Time: 03/24/15  3:46 AM  Result Value Ref Range   Prothrombin Time 39.6 (H) 11.6 - 15.2 seconds   INR 4.23 (H) 0.00 - 1.49  CBC     Status: Abnormal   Collection Time: 03/24/15  3:46 AM  Result Value Ref Range   WBC 19.5 (H) 4.0 - 10.5 K/uL   RBC 3.37 (L) 4.22 - 5.81 MIL/uL   Hemoglobin 9.7 (L) 13.0 - 17.0 g/dL   HCT 30.3 (L) 39.0 - 52.0 %   MCV 89.9 78.0 - 100.0 fL   MCH 28.8 26.0 - 34.0 pg   MCHC 32.0 30.0 - 36.0 g/dL   RDW 14.0 11.5 - 15.5 %   Platelets 296 150 - 400 K/uL  Basic metabolic panel     Status: Abnormal   Collection Time: 03/24/15  3:46 AM  Result Value Ref Range   Sodium 136 135 - 145 mmol/L   Potassium 5.1 3.5 - 5.1 mmol/L   Chloride 101 101 - 111 mmol/L   CO2 29 22 - 32  mmol/L   Glucose, Bld 141 (H) 65 - 99 mg/dL   BUN 32 (H) 6 - 20 mg/dL   Creatinine, Ser 1.15 0.61 - 1.24 mg/dL   Calcium 8.5 (L) 8.9 - 10.3 mg/dL   GFR calc non Af Amer >60 >60 mL/min   GFR calc Af Amer >60 >60 mL/min   Anion gap 6 5 - 15    Intake/Output Summary (Last 24 hours) at 03/24/15 [id number] Last data filed at 03/24/15 0200  Gross per 24 hour  Intake   1500 ml  Output    950 ml  Net    550 ml    ASSESSMENT AND PLAN:  NSTEMI:    Troponin trending down.  Cath when INR allows.  Echocardiogram is ordered and pending.   OK to transfer to telemetry.   ATRIAL FLUTTER:    INR is going up.  PM dose of warfarin was given on Saturday.  I will give PO vitamin  K  ANEMIA:  Hbg is stable.    No active bleeding evident but I will check a stool guaiac.    Jeneen Rinks [facility] 03/24/2015 8:14 AM

## 2015-03-24 NOTE — Progress Notes (Signed)
[facility]                                                                              Patient Demographics  Ryan Shea, is a 70 y.o. male, DOB - 1945/11/16, [id number]  Admit date - 03/22/2015   Admitting Physician Erline Hau, MD  Outpatient Primary MD for the patient is [name], Joycelyn Schmid  LOS - 2   Chief Complaint  Patient presents with  . Shortness of Breath      HPI on 03/22/2015 by Dr. Rande Lawman Patient is a 70 year old gentleman with multiple medical comorbidities including history of duodenal ulcers, hypertension, COPD on chronic oxygen, who presents to the hospital today with a 3 to four-day duration of shortness of breath. Unfortunately history is limited as patient is currently on BiPAP and no family members are available. He can however answer yes or no to my questions. He does say that he had some chest pain that started yesterday and points to the substernal area when asked where it hurts. He denies any radiation of the pain. His wife made him call EMS today given his severe shortness of breath. Upon arrival to the emergency department he was noted to have significant wheezing and was given several nebulizer treatments as well as steroids. Was placed on BiPAP given hypoxemia and work of breathing. Within his workup in the emergency department he was found to have leukocytosis, troponin of 0.28, chest x-ray that shows bronchial wall thickening of the right lung base that likely represents acute bronchitis. EKG that has been independently reviewed that initially showed a very rapid rate and what appear to be possible ST elevation in some lateral and inferior leads. EDP has discussed case with cardiologist at [facility], Dr. Haroldine Laws, who does not believe this represents an acute MI, however given severity of respiratory failure, possibility of MI and lack of cardiology or pulmonary coverage this weekend, it has been decided to transfer  patient to [facility] for further care.  Assessment & Plan   Elevated troponins/NSTEMI -Cardiology consulted and appreciated -Echocardiogram pending -Peak tropoinin 1.41, trending downward -Plan for cardiac cath once INR less than 1.8 -INR 4.23 today, patient given oral Vit K by cardiology  Acute on chronic respiratory failure with hypoxemia/COPD Exac -Improving -Chronic component due to COPD, acute component due to possibly pneumonia/bronchitis as well as exacerbation of COPD. -Continue nebs, steroids, Spiriva  -Will decrease solumedrol  Acute bronchitis/pneumonia -Continue Rocephin/azithromycin. -Blood cultures show no growth to date -Sputum culture pending -Strep pneumonia urine antigen positive -Legionella urine Ag negative -Influenza PCR negative -CXR shows no evidence of pna or pulm edema  Chronic kidney disease stage III -Creatinine appears to be at baseline  Leukocytosis -Likely secondary to acute respiratory illness vs steroids, monitor.  Morbid obesity -Patient will need outpatient follow up with PCP to discuss lifestyle modifications  Atrial flutter -Currently in SR -Coumadin held- INR 4.23 today, being reversed with Vit K  History of moderate aortic stenosis -monitor   History of duodenal ulcers/GI Bleed -March 2016 -Monitor CBC  Normocytic anemia -Baseline hemoglobin 9 -Hemoglobin 9.7 -Continue to monitor CBC  Code Status: Full  Family Communication: None  at bedside  Disposition Plan: Admitted, pending further recommendations from cardiology and echocardiogram. Will transfer to tele.  Time Spent in minutes   30 minutes  Procedures  None  Consults   Cardiology  DVT Prophylaxis  Coumadin (held)  Lab Results  Component Value Date   PLT 296 03/24/2015    Medications  Scheduled Meds: . aspirin  81 mg Oral Daily  . azithromycin  500 mg Intravenous Q24H  . carvedilol  6.25 mg Oral BID WC  . cefTRIAXone (ROCEPHIN)  IV  1 g  Intravenous Q24H  . lisinopril  5 mg Oral Daily  . methylPREDNISolone (SOLU-MEDROL) injection  80 mg Intravenous 3 times per day  . mometasone-formoterol  2 puff Inhalation BID  . pantoprazole  40 mg Oral Daily  . phytonadione  5 mg Oral Once  . pravastatin  40 mg Oral QHS  . sodium chloride  3 mL Intravenous Q12H  . sodium chloride  3 mL Intravenous Q12H  . tiotropium  18 mcg Inhalation Daily   Continuous Infusions:  PRN Meds:.sodium chloride, acetaminophen **OR** acetaminophen, albuterol, morphine injection, ondansetron **OR** ondansetron (ZOFRAN) IV, perflutren lipid microspheres (DEFINITY) IV suspension, senna-docusate, sodium chloride  Antibiotics   Anti-infectives    Start     Dose/Rate Route Frequency Ordered Stop   03/22/15 1330  cefTRIAXone (ROCEPHIN) 1 g in dextrose 5 % 50 mL IVPB     1 g 100 mL/hr over 30 Minutes Intravenous Every 24 hours 03/22/15 1325 03/29/15 1329   03/22/15 1330  azithromycin (ZITHROMAX) 500 mg in dextrose 5 % 250 mL IVPB     500 mg 250 mL/hr over 60 Minutes Intravenous Every 24 hours 03/22/15 1325 03/29/15 1329      Subjective:   Ryan Shea seen and examined today. Patient denies current chest pain.  Feels his breathing has improved.  Has occasional cough.  Denies abdominal pain, N/V.    Objective:   Filed Vitals:   03/24/15 0325 03/24/15 0748 03/24/15 0907 03/24/15 1138  BP: 102/51 118/58    Pulse: 84 89    Temp: 97.4 F (36.3 C) 97.8 F (36.6 C)  97.9 F (36.6 C)  TempSrc: Oral Oral  Oral  Resp: 22 17    Height:      Weight:      SpO2: 97% 97% 96%     Wt Readings from Last 3 Encounters:  03/22/15 108.8 kg (239 lb 13.8 oz)  03/02/15 107.276 kg (236 lb 8 oz)  12/06/14 106.595 kg (235 lb)     Intake/Output Summary (Last 24 hours) at 03/24/15 1240 Last data filed at 03/24/15 0200  Gross per 24 hour  Intake   1090 ml  Output    700 ml  Net    390 ml    Exam  General: Well developed, well nourished, NAD, appears stated  age  35: NCAT,  mucous membranes moist.   Cardiovascular: S1 S2 auscultated, RRR, no murmurs  Respiratory: Diminished but clear breath sounds, mild exp wheezing  Abdomen: Soft, obese, nontender, nondistended, + bowel sounds  Extremities: warm dry without cyanosis clubbing or edema  Neuro: AAOx3, nonfocal   Psych: Normal affect and demeanor, pleasant  Data Review   Micro Results Recent Results (from the past 240 hour(s))  Culture, blood (routine x 2) Call MD if unable to obtain prior to antibiotics being given     Status: None (Preliminary result)   Collection Time: 03/22/15  2:04 PM  Result Value Ref Range Status  Specimen Description BLOOD LEFT HAND  Final   Special Requests BOTTLES DRAWN AEROBIC AND ANAEROBIC 4CC EACH  Final   Culture NO GROWTH 2 DAYS  Final   Report Status PENDING  Incomplete  Culture, blood (routine x 2) Call MD if unable to obtain prior to antibiotics being given     Status: None (Preliminary result)   Collection Time: 03/22/15  2:23 PM  Result Value Ref Range Status   Specimen Description BLOOD RIGHT HAND  Final   Special Requests BOTTLES DRAWN AEROBIC AND ANAEROBIC 4CC  Final   Culture NO GROWTH 2 DAYS  Final   Report Status PENDING  Incomplete  MRSA PCR Screening     Status: None   Collection Time: 03/22/15 11:08 PM  Result Value Ref Range Status   MRSA by PCR NEGATIVE NEGATIVE Final    Comment:        The GeneXpert MRSA Assay (FDA approved for NASAL specimens only), is one component of a comprehensive MRSA colonization surveillance program. It is not intended to diagnose MRSA infection nor to guide or monitor treatment for MRSA infections.   Culture, sputum-assessment     Status: None   Collection Time: 03/23/15  9:12 AM  Result Value Ref Range Status   Specimen Description SPUTUM  Final   Special Requests NONE  Final   Sputum evaluation   Final    THIS SPECIMEN IS ACCEPTABLE. RESPIRATORY CULTURE REPORT TO FOLLOW.   Report Status  03/23/2015 FINAL  Final  Culture, respiratory (NON-Expectorated)     Status: None (Preliminary result)   Collection Time: 03/23/15  9:12 AM  Result Value Ref Range Status   Specimen Description SPUTUM  Final   Special Requests NONE  Final   Gram Stain PENDING  Incomplete   Culture   Final    Culture reincubated for better growth Performed at [facility]    Report Status PENDING  Incomplete    Radiology Reports Dg Chest 2 View  03/02/2015  CLINICAL DATA:  Short of breath and congestion. EXAM: CHEST  2 VIEW COMPARISON:  01/06/2015 FINDINGS: Normal mediastinum and cardiac silhouette. Normal pulmonary vasculature. No evidence of effusion, infiltrate, or pneumothorax. No acute bony abnormality. Degenerative osteophytosis of the thoracic spine. IMPRESSION: No active cardiopulmonary disease. Electronically Signed   By: Suzy Bouchard M.D.   On: 03/02/2015 19:09   Dg Chest Portable 1 View  03/22/2015  CLINICAL DATA:  Short of breath.  Tachycardia. EXAM: PORTABLE CHEST 1 VIEW COMPARISON:  03/02/2015. FINDINGS: Cardiac silhouette is normal in size. No mediastinal or hilar masses or convincing adenopathy. Study is degraded by motion. There is irregular bronchial wall thickening most evident in the right lung base. This may be chronic accentuated by motion. Acute bronchitis should be considered likely in the proper clinical setting. There is no lung consolidation to suggest pneumonia and no evidence of pulmonary edema. No pleural effusion or pneumothorax. The bony thorax is grossly intact. IMPRESSION: 1. No evidence of pneumonia or pulmonary edema. 2. Bronchial wall thickening to the right lung base. This may reflect acute bronchitis. Electronically Signed   By: Lajean Manes M.D.   On: 03/22/2015 08:07    CBC  Recent Labs Lab 03/22/15 0749 03/23/15 0340 03/24/15 0346  WBC 17.2* 13.0* 19.5*  HGB 10.6* 9.4* 9.7*  HCT 32.8* 28.1* 30.3*  PLT 354 240 296  MCV 91.6 90.6 89.9  MCH 29.6  30.3 28.8  MCHC 32.3 33.5 32.0  RDW 14.4 14.3 14.0  LYMPHSABS  1.7  --   --   MONOABS 1.1*  --   --   EOSABS 0.1  --   --   BASOSABS 0.0  --   --     Chemistries   Recent Labs Lab 03/22/15 0749 03/23/15 0340 03/24/15 0346  NA 139 138 136  K 5.1 4.8 5.1  CL 106 104 101  CO2 25 27 29   GLUCOSE 162* 146* 141*  BUN 38* 27* 32*  CREATININE 1.36* 0.92 1.15  CALCIUM 8.5* 8.6* 8.5*  AST 15  --   --   ALT 17  --   --   ALKPHOS 62  --   --   BILITOT 0.6  --   --    ------------------------------------------------------------------------------------------------------------------ estimated creatinine clearance is 69.2 mL/min (by C-G formula based on Cr of 1.15). ------------------------------------------------------------------------------------------------------------------ No results for input(s): HGBA1C in the last 72 hours. ------------------------------------------------------------------------------------------------------------------ No results for input(s): CHOL, HDL, LDLCALC, TRIG, CHOLHDL, LDLDIRECT in the last 72 hours. ------------------------------------------------------------------------------------------------------------------ No results for input(s): TSH, T4TOTAL, T3FREE, THYROIDAB in the last 72 hours.  Invalid input(s): FREET3 ------------------------------------------------------------------------------------------------------------------ No results for input(s): VITAMINB12, FOLATE, FERRITIN, TIBC, IRON, RETICCTPCT in the last 72 hours.  Coagulation profile  Recent Labs Lab 03/22/15 1423 03/23/15 0340 03/24/15 0346  INR 2.97* 3.36* 4.23*    No results for input(s): DDIMER in the last 72 hours.  Cardiac Enzymes  Recent Labs Lab 03/22/15 2210 03/23/15 0340 03/23/15 1000  TROPONINI 1.25* 0.84* 0.77*   ------------------------------------------------------------------------------------------------------------------ Invalid input(s):  POCBNP    Lakela Kuba D.O. on 03/24/2015 at 12:40 PM  Between 7am to 7pm - Pager - [phone]  After 7pm go to [email or web address] - password [email or web address]  And look for the night coverage person covering for me after hours  [facility] Office  [phone]

## 2015-03-24 NOTE — Progress Notes (Signed)
Pt arrived on [facility]; pt has been oriented to room, surroundings, and any applicable equipment. VSS.  CCMD notified of telemetry. Pt has been educated on the Falls Prevention Plan.  Pt in stable condition. 

## 2015-03-24 NOTE — Care Management Note (Signed)
Case Management Note  Patient Details  Name: Ryan Shea MRN: [id number] Date of Birth: [birth year]  Subjective/Objective:       Date:  03/24/15 Spoke with patient at the bedside.  Introduced self as Tourist information centre manager and explained role in discharge planning and how to be reached.  Verified patient lives in [place], with spouse. Expressed potential need for no other DME.  RN will ambulate with patient on unit to see if needs pt eval.  Verified patient anticipates to go home with wife at time of discharge and will have full-time supervision by wife at this time to best of their knowledge.  Patient denied needing help with their medication, states he gets his medications just fine with his insurance.  Patient  is driven by wife or daughter to MD appointments.  Verified patient has PCP Terance Hart. Patient does is not active with [place] services at this time.    Plan: CM will continue to follow for discharge planning and [facility] resources.              Action/Plan: NSTEMI versus demand ischemia in setting of acute bronchitis. Troponin peak 1.41. Echo raises concerns of posterior infarct. plan cardiac cath when INR less than 1.8.  INR is going up.  Expected Discharge Date:                  Expected Discharge Plan:  Home/Self Care  In-House Referral:     Discharge planning Services  CM Consult  Post Acute Care Choice:    Choice offered to:     DME Arranged:    DME Agency:     HH Arranged:    HH Agency:     Status of Service:  In process, will continue to follow  Medicare Important Message Given:    Date Medicare IM Given:    Medicare IM give by:    Date Additional Medicare IM Given:    Additional Medicare Important Message give by:     If discussed at [place] of Stay Meetings, dates discussed:    Additional Comments:  Zenon Mayo, RN 03/24/2015, 10:20 AM

## 2015-03-24 NOTE — Progress Notes (Signed)
Pt transferred to [facility] MD order. Report called to receiving nurse and all question answered.

## 2015-03-25 ENCOUNTER — Inpatient Hospital Stay (HOSPITAL_COMMUNITY): Payer: Medicare PPO

## 2015-03-25 DIAGNOSIS — D649 Anemia, unspecified: Secondary | ICD-10-CM

## 2015-03-25 LAB — CULTURE, RESPIRATORY

## 2015-03-25 LAB — CULTURE, RESPIRATORY W GRAM STAIN: Culture: NORMAL

## 2015-03-25 LAB — BASIC METABOLIC PANEL
Anion gap: 6 (ref 5–15)
BUN: 36 mg/dL — AB (ref 6–20)
CO2: 30 mmol/L (ref 22–32)
CREATININE: 1.16 mg/dL (ref 0.61–1.24)
Calcium: 8.6 mg/dL — ABNORMAL LOW (ref 8.9–10.3)
Chloride: 99 mmol/L — ABNORMAL LOW (ref 101–111)
GFR calc Af Amer: >60 mL/min (ref >60)
GLUCOSE: 111 mg/dL — AB (ref 65–99)
Potassium: 4.7 mmol/L (ref 3.5–5.1)
SODIUM: 135 mmol/L (ref 135–145)

## 2015-03-25 LAB — CBC
HCT: 30.5 % — ABNORMAL LOW (ref 39.0–52.0)
Hemoglobin: 10 g/dL — ABNORMAL LOW (ref 13.0–17.0)
MCH: 29.6 pg (ref 26.0–34.0)
MCHC: 32.8 g/dL (ref 30.0–36.0)
MCV: 90.2 fL (ref 78.0–100.0)
PLATELETS: 310 10*3/uL (ref 150–400)
RBC: 3.38 MIL/uL — ABNORMAL LOW (ref 4.22–5.81)
RDW: 14.1 % (ref 11.5–15.5)
WBC: 19.5 10*3/uL — ABNORMAL HIGH (ref 4.0–10.5)

## 2015-03-25 LAB — PROTIME-INR
INR: 1.76 — ABNORMAL HIGH (ref 0.00–1.49)
PROTHROMBIN TIME: 20.5 s — AB (ref 11.6–15.2)

## 2015-03-25 MED ORDER — SODIUM CHLORIDE 0.9 % WEIGHT BASED INFUSION
1.0000 mL/kg/h | INTRAVENOUS | Status: DC
  Administered 2015-03-26 (×2): 1 mL/kg/h via INTRAVENOUS

## 2015-03-25 MED ORDER — AZITHROMYCIN 500 MG PO TABS
500.0000 mg | ORAL_TABLET | Freq: Every day | ORAL | Status: DC
  Administered 2015-03-25 – 2015-03-27 (×3): 500 mg via ORAL
  Filled 2015-03-25 (×3): qty 1

## 2015-03-25 MED ORDER — ASPIRIN 81 MG PO CHEW
81.0000 mg | CHEWABLE_TABLET | ORAL | Status: AC
  Administered 2015-03-26: 81 mg via ORAL
  Filled 2015-03-25: qty 1

## 2015-03-25 MED ORDER — SODIUM CHLORIDE 0.9 % WEIGHT BASED INFUSION
3.0000 mL/kg/h | INTRAVENOUS | Status: DC
  Administered 2015-03-26: 3 mL/kg/h via INTRAVENOUS

## 2015-03-25 MED ORDER — METHYLPREDNISOLONE SODIUM SUCC 125 MG IJ SOLR
60.0000 mg | Freq: Every day | INTRAMUSCULAR | Status: DC
  Administered 2015-03-26 – 2015-03-27 (×2): 60 mg via INTRAVENOUS
  Filled 2015-03-25 (×2): qty 2

## 2015-03-25 NOTE — Progress Notes (Signed)
Patient: Ryan Shea / Admit Date: 03/22/2015 / Date of Encounter: 03/25/2015, 9:04 AM   Subjective: Feeling better today. No complaints.  No CP, SOB, orthopnea.   Objective: Telemetry: NSR/borderline sinus tach at times Physical Exam: Blood pressure 104/60, pulse 88, temperature 98 F (36.7 C), temperature source Oral, resp. rate 18, height 5\' 6"  (1.676 m), weight 233 lb 0.4 oz (105.7 kg), SpO2 100 %. General: Well developed obese WM in no acute distress. Head: Normocephalic, atraumatic, sclera non-icteric, no xanthomas, nares are without discharge. Neck: Supple. JVP not elevated. Lungs: Diffusely diminished without wheezes, rales, or rhonchi. Breathing is unlabored. Heart: Diminished heart sounds with soft SEM. Unable to appreciate S2. No rubs or gallops.  Abdomen: Soft, non-tender, non-distended with normoactive bowel sounds. No rebound/guarding. Extremities: No clubbing or cyanosis. No edema. Distal pedal pulses are 2+ and equal bilaterally. Neuro: Alert and oriented X 3. Moves all extremities spontaneously. Psych:  Responds to questions appropriately with a normal affect.   Intake/Output Summary (Last 24 hours) at 03/25/15 0904 Last data filed at 03/25/15 0828  Gross per 24 hour  Intake    903 ml  Output   1125 ml  Net   -222 ml    Inpatient Medications:  . aspirin  81 mg Oral Daily  . azithromycin  500 mg Intravenous Q24H  . carvedilol  6.25 mg Oral BID WC  . cefTRIAXone (ROCEPHIN)  IV  1 g Intravenous Q24H  . methylPREDNISolone (SOLU-MEDROL) injection  60 mg Intravenous Q12H  . mometasone-formoterol  2 puff Inhalation BID  . pantoprazole  40 mg Oral Daily  . pravastatin  40 mg Oral QHS  . sodium chloride  3 mL Intravenous Q12H  . sodium chloride  3 mL Intravenous Q12H  . tiotropium  18 mcg Inhalation Daily   Infusions:    Labs:  Recent Labs  03/24/15 0346 03/25/15 0550  NA 136 135  K 5.1 4.7  CL 101 99*  CO2 29 30  GLUCOSE 141* 111*  BUN 32* 36*    CREATININE 1.15 1.16  CALCIUM 8.5* 8.6*   No results for input(s): AST, ALT, ALKPHOS, BILITOT, PROT, ALBUMIN in the last 72 hours.  Recent Labs  03/24/15 0346 03/25/15 0550  WBC 19.5* 19.5*  HGB 9.7* 10.0*  HCT 30.3* 30.5*  MCV 89.9 90.2  PLT 296 310    Recent Labs  03/22/15 1956 03/22/15 2210 03/23/15 0340 03/23/15 1000  TROPONINI 1.41* 1.25* 0.84* 0.77*   Radiology/Studies:  Dg Chest 2 View  03/02/2015  CLINICAL DATA:  Short of breath and congestion. EXAM: CHEST  2 VIEW COMPARISON:  01/06/2015 FINDINGS: Normal mediastinum and cardiac silhouette. Normal pulmonary vasculature. No evidence of effusion, infiltrate, or pneumothorax. No acute bony abnormality. Degenerative osteophytosis of the thoracic spine. IMPRESSION: No active cardiopulmonary disease. Electronically Signed   By: Suzy Bouchard M.D.   On: 03/02/2015 19:09   Dg Chest Portable 1 View  03/22/2015  CLINICAL DATA:  Short of breath.  Tachycardia. EXAM: PORTABLE CHEST 1 VIEW COMPARISON:  03/02/2015. FINDINGS: Cardiac silhouette is normal in size. No mediastinal or hilar masses or convincing adenopathy. Study is degraded by motion. There is irregular bronchial wall thickening most evident in the right lung base. This may be chronic accentuated by motion. Acute bronchitis should be considered likely in the proper clinical setting. There is no lung consolidation to suggest pneumonia and no evidence of pulmonary edema. No pleural effusion or pneumothorax. The bony thorax is grossly intact. IMPRESSION: 1. No evidence  of pneumonia or pulmonary edema. 2. Bronchial wall thickening to the right lung base. This may reflect acute bronchitis. Electronically Signed   By: Lajean Manes M.D.   On: 03/22/2015 08:07     Assessment and Plan  29M with paroxysmal atrial flutter, mod AS, COPD with chronic respiratory failure on home O2, HTN, GIB 05/2014 (duodenal ulcers), gastritis, CKD III, chronic anemia, morbid obesity admitted with SOB,  acute bronchitis, and elevated troponin. 2D echo 03/24/15: mild LVH, EF 55-60%, no RWMA, grade 1 DD, severe aortic stenosis.  1. NSTEMI - peak troponin 1.4. INR now 1.76. Unfortunately cath schedule is full today. Will arrange for tomorrow. Will review with MD regarding heparin per pharmacy in the interim.  2. Aortic stenosis - severe by echo. May need further workup of this with TEE following cath - will review with MD. Plan right heart cath at time of LHC.  3. Paroxysmal atrial flutter - maintaining NSR. Coumadin on hold for cath, s/p vitamin K.  4. COPD with acute on chronic respiratory failure with COPD exacerbation due to possible PNA/bronchitis - per IM. Strep pneumo +. Tolerating BB without wheezing.  5. Anemia/history of GIB 05/2014 - Hgb stable. Stool guaiac pending.   6. CKD stage III - BPs have been borderline soft. Given upcoming cath, will hold ACEI to reduce risk of CIN.  Signed, Melina Copa PA-C Pager: [phone]   History and all data above reviewed.  Patient examined.  I agree with the findings as above.  No chest pain.  No SOB.  The patient exam reveals COR:RRR  ,  Lungs: Decreased breath sounds slightly at the bases  ,  Abd: Positive bowel sounds, no rebound no guarding, Ext Mild edema  .  All available labs, radiology testing, previous records reviewed. Agree with documented assessment and plan. Plan right and left heart cath in the AM.  Plan to hold ACE inhibitor.    Jeneen Rinks Mayline Dragon  10:10 AM  03/25/2015

## 2015-03-25 NOTE — Progress Notes (Signed)
[facility]                                                                              Patient Demographics  Ryan Shea, is a 70 y.o. male, DOB - February 28, 1946, [id number]  Admit date - 03/22/2015   Admitting Physician Erline Hau, MD  Outpatient Primary MD for the patient is Shea, Ryan Schmid  LOS - 3   Chief Complaint  Patient presents with  . Shortness of Breath      HPI on 03/22/2015 by Dr. Rande Lawman Patient is a 70 year old gentleman with multiple medical comorbidities including history of duodenal ulcers, hypertension, COPD on chronic oxygen, who presents to the hospital today with a 3 to four-day duration of shortness of breath. Unfortunately history is limited as patient is currently on BiPAP and no family members are available. He can however answer yes or no to my questions. He does say that he had some chest pain that started yesterday and points to the substernal area when asked where it hurts. He denies any radiation of the pain. His wife made him call EMS today given his severe shortness of breath. Upon arrival to the emergency department he was noted to have significant wheezing and was given several nebulizer treatments as well as steroids. Was placed on BiPAP given hypoxemia and work of breathing. Within his workup in the emergency department he was found to have leukocytosis, troponin of 0.28, chest x-ray that shows bronchial wall thickening of the right lung base that likely represents acute bronchitis. EKG that has been independently reviewed that initially showed a very rapid rate and what appear to be possible ST elevation in some lateral and inferior leads. EDP has discussed case with cardiologist at [facility], Dr. Haroldine Laws, who does not believe this represents an acute MI, however given severity of respiratory failure, possibility of MI and lack of cardiology or pulmonary coverage this weekend, it has been decided to transfer  patient to [facility] for further care.  Assessment & Plan   Elevated troponins/NSTEMI -Cardiology consulted and appreciated -Echocardiogram 0000000, grade 1 diastolic dysfunction -Peak tropoinin 1.41, trending downward -Plan for cardiac cath once INR less than 1.8 -INR 1.7 today -?Cath on 1/18  Acute on chronic respiratory failure with hypoxemia/COPD Exac -Improving -Chronic component due to COPD, acute component due to possibly pneumonia/bronchitis as well as exacerbation of COPD. -Continue nebs, steroids, Spiriva  -Will continue to decrease solumedrol  Acute bronchitis/pneumonia -Continue Rocephin/azithromycin. -Blood cultures show no growth to date -Sputum culture pending -Strep pneumonia urine antigen positive -Legionella urine Ag negative -Influenza PCR negative -CXR shows no evidence of pna or pulm edema -Pending repeat CXR today  Chronic kidney disease stage III -Creatinine appears to be at baseline  Leukocytosis -Likely secondary to acute respiratory illness vs steroids, monitor.  Morbid obesity -Patient will need outpatient follow up with PCP to discuss lifestyle modifications  Atrial flutter -Currently in SR -Coumadin held -Given PO Vit K on 03/24/15 -INR 1.76  History of moderate aortic stenosis -monitor   History of duodenal ulcers/GI Bleed -March 2016 -Monitor CBC  Normocytic anemia -Baseline hemoglobin 9 -Hemoglobin 10 -Continue to monitor CBC  Code  Status: Full  Family Communication: None at bedside  Disposition Plan: Admitted, pending catheterization  Time Spent in minutes   30 minutes  Procedures  None  Consults   Cardiology  DVT Prophylaxis  Coumadin (held)  Lab Results  Component Value Date   PLT 310 03/25/2015    Medications  Scheduled Meds: . aspirin  81 mg Oral Daily  . azithromycin  500 mg Oral Daily  . carvedilol  6.25 mg Oral BID WC  . cefTRIAXone (ROCEPHIN)  IV  1 g Intravenous Q24H  .  methylPREDNISolone (SOLU-MEDROL) injection  60 mg Intravenous Q12H  . mometasone-formoterol  2 puff Inhalation BID  . pantoprazole  40 mg Oral Daily  . pravastatin  40 mg Oral QHS  . sodium chloride  3 mL Intravenous Q12H  . sodium chloride  3 mL Intravenous Q12H  . tiotropium  18 mcg Inhalation Daily   Continuous Infusions:  PRN Meds:.sodium chloride, acetaminophen **OR** acetaminophen, albuterol, morphine injection, ondansetron **OR** ondansetron (ZOFRAN) IV, senna-docusate, sodium chloride  Antibiotics   Anti-infectives    Start     Dose/Rate Route Frequency Ordered Stop   03/25/15 1000  azithromycin (ZITHROMAX) tablet 500 mg     500 mg Oral Daily 03/25/15 0942 03/30/15 0959   03/22/15 1330  cefTRIAXone (ROCEPHIN) 1 g in dextrose 5 % 50 mL IVPB     1 g 100 mL/hr over 30 Minutes Intravenous Every 24 hours 03/22/15 1325 03/29/15 1329   03/22/15 1330  azithromycin (ZITHROMAX) 500 mg in dextrose 5 % 250 mL IVPB  Status:  Discontinued     500 mg 250 mL/hr over 60 Minutes Intravenous Every 24 hours 03/22/15 1325 03/25/15 0942      Subjective:   [name] seen and examined today. Patient denies current chest pain.  Feels his breathing has improved and is close to his baseline.  Denies abdominal pain, N/V.    Objective:   Filed Vitals:   03/24/15 2052 03/24/15 2300 03/25/15 0514 03/25/15 0811  BP: 104/56 103/47 125/62 104/60  Pulse: 99 84 83 88  Temp: 97.6 F (36.4 C) 97.7 F (36.5 C) 97.6 F (36.4 C) 98 F (36.7 C)  TempSrc:  Oral Oral Oral  Resp: 18 18 18 18   Height:      Weight:   105.7 kg (233 lb 0.4 oz)   SpO2: 100% 98% 97% 100%    Wt Readings from Last 3 Encounters:  03/25/15 105.7 kg (233 lb 0.4 oz)  03/02/15 107.276 kg (236 lb 8 oz)  12/06/14 106.595 kg (235 lb)     Intake/Output Summary (Last 24 hours) at 03/25/15 1007 Last data filed at 03/25/15 0933  Gross per 24 hour  Intake   1078 ml  Output   1125 ml  Net    -47 ml    Exam  General: Well  developed, well nourished, NAD, appears stated age  84: NCAT,  mucous membranes moist.   Cardiovascular: S1 S2 auscultated, RRR, 1/6 SEM  Respiratory: Diminished but clear breath sounds  Abdomen: Soft, obese, nontender, nondistended, + bowel sounds  Extremities: warm dry without cyanosis clubbing or edema  Neuro: AAOx3, nonfocal   Psych: Normal affect and demeanor, pleasant  Data Review   Micro Results Recent Results (from the past 240 hour(s))  Culture, blood (routine x 2) Call MD if unable to obtain prior to antibiotics being given     Status: None (Preliminary result)   Collection Time: 03/22/15  2:04 PM  Result Value Ref  Range Status   Specimen Description BLOOD LEFT HAND  Final   Special Requests BOTTLES DRAWN AEROBIC AND ANAEROBIC 4CC EACH  Final   Culture NO GROWTH 2 DAYS  Final   Report Status PENDING  Incomplete  Culture, blood (routine x 2) Call MD if unable to obtain prior to antibiotics being given     Status: None (Preliminary result)   Collection Time: 03/22/15  2:23 PM  Result Value Ref Range Status   Specimen Description BLOOD RIGHT HAND  Final   Special Requests BOTTLES DRAWN AEROBIC AND ANAEROBIC 4CC  Final   Culture NO GROWTH 2 DAYS  Final   Report Status PENDING  Incomplete  MRSA PCR Screening     Status: None   Collection Time: 03/22/15 11:08 PM  Result Value Ref Range Status   MRSA by PCR NEGATIVE NEGATIVE Final    Comment:        The GeneXpert MRSA Assay (FDA approved for NASAL specimens only), is one component of a comprehensive MRSA colonization surveillance program. It is not intended to diagnose MRSA infection nor to guide or monitor treatment for MRSA infections.   Culture, sputum-assessment     Status: None   Collection Time: 03/23/15  9:12 AM  Result Value Ref Range Status   Specimen Description SPUTUM  Final   Special Requests NONE  Final   Sputum evaluation   Final    THIS SPECIMEN IS ACCEPTABLE. RESPIRATORY CULTURE REPORT TO  FOLLOW.   Report Status 03/23/2015 FINAL  Final  Culture, respiratory (NON-Expectorated)     Status: None (Preliminary result)   Collection Time: 03/23/15  9:12 AM  Result Value Ref Range Status   Specimen Description SPUTUM  Final   Special Requests NONE  Final   Gram Stain PENDING  Incomplete   Culture   Final    Culture reincubated for better growth Performed at [facility]    Report Status PENDING  Incomplete    Radiology Reports Dg Chest 2 View  03/02/2015  CLINICAL DATA:  Short of breath and congestion. EXAM: CHEST  2 VIEW COMPARISON:  01/06/2015 FINDINGS: Normal mediastinum and cardiac silhouette. Normal pulmonary vasculature. No evidence of effusion, infiltrate, or pneumothorax. No acute bony abnormality. Degenerative osteophytosis of the thoracic spine. IMPRESSION: No active cardiopulmonary disease. Electronically Signed   By: Suzy Bouchard M.D.   On: 03/02/2015 19:09   Dg Chest Portable 1 View  03/22/2015  CLINICAL DATA:  Short of breath.  Tachycardia. EXAM: PORTABLE CHEST 1 VIEW COMPARISON:  03/02/2015. FINDINGS: Cardiac silhouette is normal in size. No mediastinal or hilar masses or convincing adenopathy. Study is degraded by motion. There is irregular bronchial wall thickening most evident in the right lung base. This may be chronic accentuated by motion. Acute bronchitis should be considered likely in the proper clinical setting. There is no lung consolidation to suggest pneumonia and no evidence of pulmonary edema. No pleural effusion or pneumothorax. The bony thorax is grossly intact. IMPRESSION: 1. No evidence of pneumonia or pulmonary edema. 2. Bronchial wall thickening to the right lung base. This may reflect acute bronchitis. Electronically Signed   By: Lajean Manes M.D.   On: 03/22/2015 08:07    CBC  Recent Labs Lab 03/22/15 0749 03/23/15 0340 03/24/15 0346 03/25/15 0550  WBC 17.2* 13.0* 19.5* 19.5*  HGB 10.6* 9.4* 9.7* 10.0*  HCT 32.8* 28.1* 30.3*  30.5*  PLT 354 240 296 310  MCV 91.6 90.6 89.9 90.2  MCH 29.6 30.3 28.8 29.6  MCHC 32.3 33.5 32.0 32.8  RDW 14.4 14.3 14.0 14.1  LYMPHSABS 1.7  --   --   --   MONOABS 1.1*  --   --   --   EOSABS 0.1  --   --   --   BASOSABS 0.0  --   --   --     Chemistries   Recent Labs Lab 03/22/15 0749 03/23/15 0340 03/24/15 0346 03/25/15 0550  NA 139 138 136 135  K 5.1 4.8 5.1 4.7  CL 106 104 101 99*  CO2 25 27 29 30   GLUCOSE 162* 146* 141* 111*  BUN 38* 27* 32* 36*  CREATININE 1.36* 0.92 1.15 1.16  CALCIUM 8.5* 8.6* 8.5* 8.6*  AST 15  --   --   --   ALT 17  --   --   --   ALKPHOS 62  --   --   --   BILITOT 0.6  --   --   --    ------------------------------------------------------------------------------------------------------------------ estimated creatinine clearance is 67.6 mL/min (by C-G formula based on Cr of 1.16). ------------------------------------------------------------------------------------------------------------------ No results for input(s): HGBA1C in the last 72 hours. ------------------------------------------------------------------------------------------------------------------ No results for input(s): CHOL, HDL, LDLCALC, TRIG, CHOLHDL, LDLDIRECT in the last 72 hours. ------------------------------------------------------------------------------------------------------------------ No results for input(s): TSH, T4TOTAL, T3FREE, THYROIDAB in the last 72 hours.  Invalid input(s): FREET3 ------------------------------------------------------------------------------------------------------------------ No results for input(s): VITAMINB12, FOLATE, FERRITIN, TIBC, IRON, RETICCTPCT in the last 72 hours.  Coagulation profile  Recent Labs Lab 03/22/15 1423 03/23/15 0340 03/24/15 0346 03/25/15 0550  INR 2.97* 3.36* 4.23* 1.76*    No results for input(s): DDIMER in the last 72 hours.  Cardiac Enzymes  Recent Labs Lab 03/22/15 2210 03/23/15 0340  03/23/15 1000  TROPONINI 1.25* 0.84* 0.77*   ------------------------------------------------------------------------------------------------------------------ Invalid input(s): POCBNP    Shondrea Steinert D.O. on 03/25/2015 at 10:07 AM  Between 7am to 7pm - Pager - [phone]  After 7pm go to [email or web address] - password [email or web address]  And look for the night coverage person covering for me after hours  [facility] Office  [phone]

## 2015-03-25 NOTE — Care Management Important Message (Signed)
Important Message  Patient Details  Name: Ryan Shea MRN: [id number] Date of Birth: [birth year]   Medicare Important Message Given:  Yes    [name] [name] 03/25/2015, 3:27 PM

## 2015-03-26 ENCOUNTER — Encounter (HOSPITAL_COMMUNITY)
Admission: EM | Disposition: A | Discharge status: Home / Self Care | Payer: Medicare PPO | Source: Home / Self Care | Attending: Internal Medicine

## 2015-03-26 DIAGNOSIS — R0602 Shortness of breath: Secondary | ICD-10-CM | POA: Insufficient documentation

## 2015-03-26 HISTORY — PX: CARDIAC CATHETERIZATION: SHX172

## 2015-03-26 LAB — LIPID PANEL
CHOL/HDL RATIO: 3.6 ratio
CHOLESTEROL: 166 mg/dL (ref 0–200)
HDL: 46 mg/dL (ref >40)
LDL Cholesterol: 106 mg/dL — ABNORMAL HIGH (ref 0–99)
Triglycerides: 71 mg/dL (ref <150)
VLDL: 14 mg/dL (ref 0–40)

## 2015-03-26 LAB — POCT I-STAT 3, VENOUS BLOOD GAS (G3P V)
ACID-BASE EXCESS: 4 mmol/L — AB (ref 0.0–2.0)
ACID-BASE EXCESS: 6 mmol/L — AB (ref 0.0–2.0)
Acid-Base Excess: 4 mmol/L — ABNORMAL HIGH (ref 0.0–2.0)
Acid-Base Excess: 4 mmol/L — ABNORMAL HIGH (ref 0.0–2.0)
BICARBONATE: 27 meq/L — AB (ref 20.0–24.0)
BICARBONATE: 28.6 meq/L — AB (ref 20.0–24.0)
BICARBONATE: 30.4 meq/L — AB (ref 20.0–24.0)
Bicarbonate: 31.9 mEq/L — ABNORMAL HIGH (ref 20.0–24.0)
O2 SAT: 100 %
O2 SAT: 89 %
O2 Saturation: 98 %
O2 Saturation: 67 %
PCO2 VEN: 25.7 mmHg — AB (ref 45.0–50.0)
PCO2 VEN: 44.1 mmHg — AB (ref 45.0–50.0)
PCO2 VEN: 55.2 mmHg — AB (ref 45.0–50.0)
PCO2 VEN: 64 mmHg — AB (ref 45.0–50.0)
PH VEN: 7.348 — AB (ref 7.250–7.300)
PH VEN: 7.305 — AB (ref 7.250–7.300)
PO2 VEN: 107 mmHg — AB (ref 30.0–45.0)
PO2 VEN: 212 mmHg — AB (ref 30.0–45.0)
PO2 VEN: 56 mmHg — AB (ref 30.0–45.0)
TCO2: 28 mmol/L (ref 0–100)
TCO2: 30 mmol/L (ref 0–100)
TCO2: 32 mmol/L (ref 0–100)
TCO2: 34 mmol/L (ref 0–100)
pH, Ven: 7.421 — ABNORMAL HIGH (ref 7.250–7.300)
pH, Ven: 7.629 (ref 7.250–7.300)
pO2, Ven: 39 mmHg (ref 30.0–45.0)

## 2015-03-26 LAB — BASIC METABOLIC PANEL
ANION GAP: 6 (ref 5–15)
BUN: 36 mg/dL — ABNORMAL HIGH (ref 6–20)
CHLORIDE: 99 mmol/L — AB (ref 101–111)
CO2: 31 mmol/L (ref 22–32)
CREATININE: 1.19 mg/dL (ref 0.61–1.24)
Calcium: 8.6 mg/dL — ABNORMAL LOW (ref 8.9–10.3)
GFR calc non Af Amer: >60 mL/min (ref >60)
Glucose, Bld: 102 mg/dL — ABNORMAL HIGH (ref 65–99)
Potassium: 4.7 mmol/L (ref 3.5–5.1)
SODIUM: 136 mmol/L (ref 135–145)

## 2015-03-26 LAB — CBC
HCT: 32.4 % — ABNORMAL LOW (ref 39.0–52.0)
HEMOGLOBIN: 10.4 g/dL — AB (ref 13.0–17.0)
MCH: 29.3 pg (ref 26.0–34.0)
MCHC: 32.1 g/dL (ref 30.0–36.0)
MCV: 91.3 fL (ref 78.0–100.0)
PLATELETS: 318 10*3/uL (ref 150–400)
RBC: 3.55 MIL/uL — AB (ref 4.22–5.81)
RDW: 14.2 % (ref 11.5–15.5)
WBC: 17.2 10*3/uL — AB (ref 4.0–10.5)

## 2015-03-26 LAB — PROTIME-INR
INR: 1.28 (ref 0.00–1.49)
Prothrombin Time: 16.1 seconds — ABNORMAL HIGH (ref 11.6–15.2)

## 2015-03-26 SURGERY — RIGHT/LEFT HEART CATH AND CORONARY ANGIOGRAPHY

## 2015-03-26 MED ORDER — FENTANYL CITRATE (PF) 100 MCG/2ML IJ SOLN
INTRAMUSCULAR | Status: DC | PRN
  Administered 2015-03-26: 25 ug via INTRAVENOUS

## 2015-03-26 MED ORDER — SODIUM CHLORIDE 0.9 % IV SOLN: 250.0000 mL | INTRAVENOUS | Status: DC | PRN

## 2015-03-26 MED ORDER — HEPARIN (PORCINE) IN NACL 2-0.9 UNIT/ML-% IJ SOLN
INTRAMUSCULAR | Status: AC
  Filled 2015-03-26: qty 1000

## 2015-03-26 MED ORDER — SODIUM CHLORIDE 0.9 % IJ SOLN: 3.0000 mL | INTRAMUSCULAR | Status: DC | PRN

## 2015-03-26 MED ORDER — SODIUM CHLORIDE 0.9 % IV SOLN
INTRAVENOUS | Status: AC
  Administered 2015-03-26: 16:00:00 via INTRAVENOUS

## 2015-03-26 MED ORDER — MIDAZOLAM HCL 2 MG/2ML IJ SOLN
INTRAMUSCULAR | Status: DC | PRN
  Administered 2015-03-26: 1 mg via INTRAVENOUS

## 2015-03-26 MED ORDER — LIDOCAINE HCL (PF) 1 % IJ SOLN
INTRAMUSCULAR | Status: AC
  Filled 2015-03-26: qty 30

## 2015-03-26 MED ORDER — FENTANYL CITRATE (PF) 100 MCG/2ML IJ SOLN
INTRAMUSCULAR | Status: AC
  Filled 2015-03-26: qty 2

## 2015-03-26 MED ORDER — SODIUM CHLORIDE 0.9 % IJ SOLN
3.0000 mL | Freq: Two times a day (BID) | INTRAMUSCULAR | Status: DC
  Administered 2015-03-26: 3 mL via INTRAVENOUS

## 2015-03-26 MED ORDER — HEPARIN (PORCINE) IN NACL 2-0.9 UNIT/ML-% IJ SOLN
INTRAMUSCULAR | Status: DC | PRN
  Administered 2015-03-26: 15:00:00

## 2015-03-26 MED ORDER — HEPARIN SODIUM (PORCINE) 1000 UNIT/ML IJ SOLN
INTRAMUSCULAR | Status: AC
  Filled 2015-03-26: qty 1

## 2015-03-26 MED ORDER — VERAPAMIL HCL 2.5 MG/ML IV SOLN
INTRAVENOUS | Status: AC
  Filled 2015-03-26: qty 2

## 2015-03-26 MED ORDER — MIDAZOLAM HCL 2 MG/2ML IJ SOLN
INTRAMUSCULAR | Status: AC
  Filled 2015-03-26: qty 2

## 2015-03-26 MED ORDER — IOHEXOL 350 MG/ML SOLN
INTRAVENOUS | Status: DC | PRN
  Administered 2015-03-26: 65 mL via INTRA_ARTERIAL

## 2015-03-26 SURGICAL SUPPLY — 12 items
CATH INFINITI 5FR JL4 (CATHETERS) ×3 IMPLANT
CATH INFINITI JR4 5F (CATHETERS) ×3 IMPLANT
CATH SWAN GANZ 7F STRAIGHT (CATHETERS) ×3 IMPLANT
KIT HEART LEFT (KITS) ×3 IMPLANT
KIT HEART RIGHT NAMIC (KITS) ×3 IMPLANT
PACK CARDIAC CATHETERIZATION (CUSTOM PROCEDURE TRAY) ×3 IMPLANT
SHEATH PINNACLE 5F 10CM (SHEATH) ×3 IMPLANT
SHEATH PINNACLE 7F 10CM (SHEATH) ×3 IMPLANT
TRANSDUCER W/STOPCOCK (MISCELLANEOUS) ×3 IMPLANT
TUBING CIL FLEX 10 FLL-RA (TUBING) IMPLANT
WIRE EMERALD ST .035X150CM (WIRE) ×3 IMPLANT
WIRE SAFE-T 1.5MM-J .035X260CM (WIRE) ×3 IMPLANT

## 2015-03-26 NOTE — Evaluation (Signed)
Physical Therapy Evaluation Patient Details Name: Ryan Shea MRN: [id number] DOB: 08-06-1945 Today's Date: 03/26/2015   History of Present Illness  Pt is a 70 y/o male admitted w/ SOB, acute chronic respiratory failure w/ hypoxia. He has extensive PMH (please review PMH in chart for details).   Clinical Impression  Patient demonstrates modest deficits in mobility but at this time requires no physical assist. Educated patient regarding energy conservation techniques and safety with mobility, do not feel patient requires additional PT at this time, will sign off, patient in agreement. Encouraged increased mobility.    Follow Up Recommendations No PT follow up;Supervision - Intermittent    Equipment Recommendations  None recommended by PT    Recommendations for Other Services       Precautions / Restrictions Precautions Precautions: Fall Restrictions Weight Bearing Restrictions: No      Mobility  Bed Mobility Overal bed mobility: Independent             General bed mobility comments: no assist required  Transfers Overall transfer level: Modified independent Equipment used: None             General transfer comment: Increased time to come to standing initially, no physical assist or cues required  Ambulation/Gait Ambulation/Gait assistance: Independent Ambulation Distance (Feet): 180 Feet Assistive device: None Gait Pattern/deviations: Step-through pattern;Wide base of support;Drifts right/left Gait velocity: decreased Gait velocity interpretation: Below normal speed for age/gender General Gait Details: slow but steady with gait, no noted LOB O2 saturations stable on room air during spot checks  Stairs            Wheelchair Mobility    Modified Rankin (Stroke Patients Only)       Balance Overall balance assessment: No apparent balance deficits (not formally assessed)                                           Pertinent  Vitals/Pain Pain Assessment: No/denies pain    Home Living Family/patient expects to be discharged to:: Private residence Living Arrangements: Spouse/significant other Available Help at Discharge: Family;Available PRN/intermittently Type of Home: Apartment Home Access: Level entry     Home Layout: One level Home Equipment: None      Prior Function Level of Independence: Independent               Hand Dominance   Dominant Hand: Right    Extremity/Trunk Assessment   Upper Extremity Assessment: Overall WFL for tasks assessed           Lower Extremity Assessment: Overall WFL for tasks assessed         Communication   Communication: No difficulties  Cognition Arousal/Alertness: Awake/alert Behavior During Therapy: WFL for tasks assessed/performed Overall Cognitive Status: Within Functional Limits for tasks assessed                      General Comments General comments (skin integrity, edema, etc.): educated on energy conservation and PLB tecniques    Exercises        Assessment/Plan    PT Assessment Patent does not need any further PT services  PT Diagnosis Difficulty walking   PT Problem List    PT Treatment Interventions     PT Goals (Current goals can be found in the Care Plan section) Acute Rehab PT Goals Patient Stated Goal: "Go home, everything will be  fine after this" Cath scheduled for later today. PT Goal Formulation: All assessment and education complete, DC therapy    Frequency     Barriers to discharge        Co-evaluation               End of Session Equipment Utilized During Treatment: Gait belt Activity Tolerance: Patient tolerated treatment well;Patient limited by fatigue Patient left: in bed;with call bell/phone within reach (sitting EOB) Nurse Communication: Mobility status         Time: [id number] PT Time Calculation (min) (ACUTE ONLY): 15 min   Charges:   PT Evaluation $PT Eval Low Complexity: 1  Procedure     PT G CodesDuncan Dull 04/13/15, 11:30 AM Alben Deeds, PT DPT  [phone]

## 2015-03-26 NOTE — H&P (View-Only) (Signed)
    SUBJECTIVE:  No chest pain.  Breathing is better but still coughing up green sputum   PHYSICAL EXAM Filed Vitals:   03/25/15 2040 03/25/15 2158 03/26/15 0440 03/26/15 0743  BP:  111/50 132/67   Pulse:  93 93   Temp:  97.7 F (36.5 C) 97.6 F (36.4 C)   TempSrc:  Oral    Resp:  18 18   Height:      Weight:   227 lb 8.2 oz (103.2 kg)   SpO2: 97% 98% 98% 96%   General:  No distress Lungs:  Decreased breath sounds Heart:  RRR Abdomen:  Positive bowel sounds, no rebound no guarding Extremities:  Trace edema   LABS:  Results for orders placed or performed during the hospital encounter of 03/22/15 (from the past 24 hour(s))  Protime-INR     Status: Abnormal   Collection Time: 03/26/15  4:16 AM  Result Value Ref Range   Prothrombin Time 16.1 (H) 11.6 - 15.2 seconds   INR 1.28 0.00 - [id number]  Basic metabolic panel     Status: Abnormal   Collection Time: 03/26/15  4:16 AM  Result Value Ref Range   Sodium 136 135 - 145 mmol/L   Potassium 4.7 3.5 - 5.1 mmol/L   Chloride 99 (L) 101 - 111 mmol/L   CO2 31 22 - 32 mmol/L   Glucose, Bld 102 (H) 65 - 99 mg/dL   BUN 36 (H) 6 - 20 mg/dL   Creatinine, Ser 1.19 0.61 - 1.24 mg/dL   Calcium 8.6 (L) 8.9 - 10.3 mg/dL   GFR calc non Af Amer >60 >60 mL/min   GFR calc Af Amer >60 >60 mL/min   Anion gap 6 5 - 15  CBC     Status: Abnormal   Collection Time: 03/26/15  4:16 AM  Result Value Ref Range   WBC 17.2 (H) 4.0 - 10.5 K/uL   RBC 3.55 (L) 4.22 - 5.81 MIL/uL   Hemoglobin 10.4 (L) 13.0 - 17.0 g/dL   HCT 32.4 (L) 39.0 - 52.0 %   MCV 91.3 78.0 - 100.0 fL   MCH 29.3 26.0 - 34.0 pg   MCHC 32.1 30.0 - 36.0 g/dL   RDW 14.2 11.5 - 15.5 %   Platelets 318 150 - 400 K/uL  Lipid panel     Status: Abnormal   Collection Time: 03/26/15  4:16 AM  Result Value Ref Range   Cholesterol 166 0 - 200 mg/dL   Triglycerides 71 <150 mg/dL   HDL 46 >40 mg/dL   Total CHOL/HDL Ratio 3.6 RATIO   VLDL 14 0 - 40 mg/dL   LDL Cholesterol 106 (H) 0 - 99 mg/dL     Intake/Output Summary (Last 24 hours) at 03/26/15 1023 Last data filed at 03/26/15 0903  Gross per 24 hour  Intake    958 ml  Output   1600 ml  Net   -642 ml     ASSESSMENT AND PLAN:  NSTEMI:    Cath today.  AORTIC STENOSIS:  Right and left heart cath as above.   ATRIAL FLUTTER:  Paroxysmal.  Warfarin reversed.  Will need to be restarted after the cath.    CKD:    Creat is stable  ANEMIA:  H/H stable No evidence of active bleeding.   Guaiac was never sent.   Jeneen Rinks [facility] 03/26/2015 10:23 AM

## 2015-03-26 NOTE — Progress Notes (Signed)
Site area: Rt fem art/Rt fem vein Pressure Applied For:25 min Manual:  Manual hold  Patient Status During Pull:  A/O Post Pull Site:  Level 0 Post Pull Instructions Given: Pt understands post sheath pull instructions Post Pull Pulses Present: 2+ rt dp Dressing Applied:  tegaderm and 4x4 Bedrest begins @ 15:25:00 Comments: Pt leaves ha in stable condition. Rt groin unremarkable. Dressing is CDI.

## 2015-03-26 NOTE — Evaluation (Signed)
Occupational Therapy Evaluation Patient Details Name: KILEN BOBLITT MRN: [id number] DOB: 15-Oct-1945 Today's Date: 03/26/2015    History of Present Illness Pt is a 70 y/o male admitted w/ SOB, acute on chronic respiratory failure w/ hypoxia. He has extensive PMH (please review PMH in chart for details).    Clinical Impression   Pt is a pleasant 70 y/o male with extensive PMH admitted with recent SOB and acute on chronic respiratory failure. He lives with his wife and is overall Mod I ADL's and functional mobility at this time. He is scheduled for cardiac cath procedure later today, but states that he has no acute OT needs as he plans to d/c home with family assistance PRN when medically able. He was educated that should his needs change, OT will be happy to re-assess.    Follow Up Recommendations  No OT follow up;Supervision - Intermittent    Equipment Recommendations  None recommended by OT    Recommendations for Other Services       Precautions / Restrictions Precautions Precautions: Fall Restrictions Weight Bearing Restrictions: No      Mobility Bed Mobility Overal bed mobility: Independent             General bed mobility comments: Pt sitting EOB, then able to get in/out of bed w/o assistance.  Transfers Overall transfer level: Modified independent Equipment used: None             General transfer comment: Pt transferred in/out of bed, stood at sink for simulated ADL and on/off toilet w/o assist or AD. Mod I level.    Balance Overall balance assessment: No apparent balance deficits (not formally assessed)                                          ADL Overall ADL's : At baseline                                       General ADL Comments: Pt was assessed for bed mobility, standing at sink, toilet transfer and LB dressing with sit-stand. He is overall Mod I at this time. Pt was educated on Role of OT and currently  declines occupational therapy needs as he states that his wife and daughter can assist intermittently at discharge. He is scheduled for cardiac cath later today and was educated verbally that OT will reassess should his needs change after that procedure. He verbalized understanding stating "I will be just fine".     Vision  No visual deficits; No change from baseline   Perception     Praxis      Pertinent Vitals/Pain Pain Assessment: No/denies pain     Hand Dominance Right   Extremity/Trunk Assessment Upper Extremity Assessment Upper Extremity Assessment: Overall WFL for tasks assessed   Lower Extremity Assessment Lower Extremity Assessment: Defer to PT evaluation       Communication Communication Communication: No difficulties   Cognition Arousal/Alertness: Awake/alert Behavior During Therapy: WFL for tasks assessed/performed Overall Cognitive Status: Within Functional Limits for tasks assessed                     General Comments   UE strength generally 4+/5 overall     Exercises       Shoulder Instructions  Home Living Family/patient expects to be discharged to:: Private residence Living Arrangements: Spouse/significant other Available Help at Discharge: Family;Available PRN/intermittently Type of Home: Apartment Home Access: Level entry     Home Layout: One level     Bathroom Shower/Tub: Teacher, early years/pre: Handicapped height     Home Equipment: None          Prior Functioning/Environment Level of Independence: Independent             OT Diagnosis:     OT Problem List:     OT Treatment/Interventions:      OT Goals(Current goals can be found in the care plan section) Acute Rehab OT Goals Patient Stated Goal: "Go home, everything will be fine after this" Cath scheduled for later today. OT Goal Formulation: All assessment and education complete, DC therapy  OT Frequency:     Barriers to D/C:             Co-evaluation              End of Session    Activity Tolerance: Patient tolerated treatment well Patient left: in bed;with call bell/phone within reach   Time: 0750-0805 OT Time Calculation (min): 15 min Charges:  OT General Charges $OT Visit: 1 Procedure OT Evaluation $OT Eval Low Complexity: 1 Procedure G-Codes:    Leafy Motsinger Beth Dixon, OTR/L 03/26/2015, 8:16 AM

## 2015-03-26 NOTE — Interval H&P Note (Signed)
History and Physical Interval Note:  03/26/2015 1:55 PM  Ryan Shea  has presented today for cardiac cath with the diagnosis of NSTEMI/severe AS. The various methods of treatment have been discussed with the patient and family. After consideration of risks, benefits and other options for treatment, the patient has consented to  Procedure(s): Right/Left Heart Cath and Coronary Angiography (N/A) as a surgical intervention .  The patient's history has been reviewed, patient examined, no change in status, stable for surgery.  I have reviewed the patient's chart and labs.  Questions were answered to the patient's satisfaction.    Cath Lab Visit (complete for each Cath Lab visit)  Clinical Evaluation Leading to the Procedure:   ACS: Yes.    Non-ACS:    Anginal Classification: CCS IV  Anti-ischemic medical therapy: Minimal Therapy (1 class of medications)  Non-Invasive Test Results: No non-invasive testing performed  Prior CABG: No previous CABG         MCALHANY,CHRISTOPHER

## 2015-03-26 NOTE — Progress Notes (Signed)
    SUBJECTIVE:  No chest pain.  Breathing is better but still coughing up green sputum   PHYSICAL EXAM Filed Vitals:   03/25/15 2040 03/25/15 2158 03/26/15 0440 03/26/15 0743  BP:  111/50 132/67   Pulse:  93 93   Temp:  97.7 F (36.5 C) 97.6 F (36.4 C)   TempSrc:  Oral    Resp:  18 18   Height:      Weight:   227 lb 8.2 oz (103.2 kg)   SpO2: 97% 98% 98% 96%   General:  No distress Lungs:  Decreased breath sounds Heart:  RRR Abdomen:  Positive bowel sounds, no rebound no guarding Extremities:  Trace edema   LABS:  Results for orders placed or performed during the hospital encounter of 03/22/15 (from the past 24 hour(s))  Protime-INR     Status: Abnormal   Collection Time: 03/26/15  4:16 AM  Result Value Ref Range   Prothrombin Time 16.1 (H) 11.6 - 15.2 seconds   INR 1.28 0.00 - 99991111  Basic metabolic panel     Status: Abnormal   Collection Time: 03/26/15  4:16 AM  Result Value Ref Range   Sodium 136 135 - 145 mmol/L   Potassium 4.7 3.5 - 5.1 mmol/L   Chloride 99 (L) 101 - 111 mmol/L   CO2 31 22 - 32 mmol/L   Glucose, Bld 102 (H) 65 - 99 mg/dL   BUN 36 (H) 6 - 20 mg/dL   Creatinine, Ser 1.19 0.61 - 1.24 mg/dL   Calcium 8.6 (L) 8.9 - 10.3 mg/dL   GFR calc non Af Amer >60 >60 mL/min   GFR calc Af Amer >60 >60 mL/min   Anion gap 6 5 - 15  CBC     Status: Abnormal   Collection Time: 03/26/15  4:16 AM  Result Value Ref Range   WBC 17.2 (H) 4.0 - 10.5 K/uL   RBC 3.55 (L) 4.22 - 5.81 MIL/uL   Hemoglobin 10.4 (L) 13.0 - 17.0 g/dL   HCT 32.4 (L) 39.0 - 52.0 %   MCV 91.3 78.0 - 100.0 fL   MCH 29.3 26.0 - 34.0 pg   MCHC 32.1 30.0 - 36.0 g/dL   RDW 14.2 11.5 - 15.5 %   Platelets 318 150 - 400 K/uL  Lipid panel     Status: Abnormal   Collection Time: 03/26/15  4:16 AM  Result Value Ref Range   Cholesterol 166 0 - 200 mg/dL   Triglycerides 71 <150 mg/dL   HDL 46 >40 mg/dL   Total CHOL/HDL Ratio 3.6 RATIO   VLDL 14 0 - 40 mg/dL   LDL Cholesterol 106 (H) 0 - 99 mg/dL     Intake/Output Summary (Last 24 hours) at 03/26/15 1023 Last data filed at 03/26/15 0903  Gross per 24 hour  Intake    958 ml  Output   1600 ml  Net   -642 ml     ASSESSMENT AND PLAN:  NSTEMI:    Cath today.  AORTIC STENOSIS:  Right and left heart cath as above.   ATRIAL FLUTTER:  Paroxysmal.  Warfarin reversed.  Will need to be restarted after the cath.    CKD:    Creat is stable  ANEMIA:  H/H stable No evidence of active bleeding.   Guaiac was never sent.   Jeneen Rinks [facility] 03/26/2015 10:23 AM

## 2015-03-26 NOTE — Progress Notes (Signed)
[facility]                                                                              Patient Demographics  Ryan Shea, is a 70 y.o. male, DOB - [birth year], [id number]  Admit date - 03/22/2015   Admitting Physician Erline Hau, MD  Outpatient Primary MD for the patient is [name], Joycelyn Schmid  LOS - 4   Chief Complaint  Patient presents with  . Shortness of Breath      HPI on 03/22/2015 by Dr. Rande Lawman Patient is a 70 year old gentleman with multiple medical comorbidities including history of duodenal ulcers, hypertension, COPD on chronic oxygen, who presents to the hospital today with a 3 to four-day duration of shortness of breath. Unfortunately history is limited as patient is currently on BiPAP and no family members are available. He can however answer yes or no to my questions. He does say that he had some chest pain that started yesterday and points to the substernal area when asked where it hurts. He denies any radiation of the pain. His wife made him call EMS today given his severe shortness of breath. Upon arrival to the emergency department he was noted to have significant wheezing and was given several nebulizer treatments as well as steroids. Was placed on BiPAP given hypoxemia and work of breathing. Within his workup in the emergency department he was found to have leukocytosis, troponin of 0.28, chest x-ray that shows bronchial wall thickening of the right lung base that likely represents acute bronchitis. EKG that has been independently reviewed that initially showed a very rapid rate and what appear to be possible ST elevation in some lateral and inferior leads. EDP has discussed case with cardiologist at [facility], Dr. Haroldine Laws, who does not believe this represents an acute MI, however given severity of respiratory failure, possibility of MI and lack of cardiology or pulmonary coverage this weekend, it has been decided to transfer  patient to [facility] for further care.  Interim history Admitted for resp failure/COPD exac, NSTEMI. Plan for cath today.  Assessment & Plan   Elevated troponins/NSTEMI -Cardiology consulted and appreciated -Echocardiogram 0000000, grade 1 diastolic dysfunction -Peak tropoinin 1.41, trending downward -Plan for cardiac cath once INR less than 1.8 -INR 1.28 today -Right/left heart cath today  Acute on chronic respiratory failure with hypoxemia/COPD Exac -Improving -Chronic component due to COPD, acute component due to possibly pneumonia/bronchitis as well as exacerbation of COPD. -Continue nebs, steroids, Spiriva  -Transition to oral prednisone after cath  Acute bronchitis/pneumonia -Continue Rocephin/azithromycin. -Blood cultures show no growth to date -Sputum culture pending -Strep pneumonia urine antigen positive -Legionella urine Ag negative -Influenza PCR negative -CXR shows no evidence of pna or pulm edema -CXR 03/25/15: No active cardiopulm disease  Chronic kidney disease stage III -Creatinine appears to be at baseline, currently 1.19  Leukocytosis -Likely secondary to acute respiratory illness vs steroids, monitor. -Trending downward  Morbid obesity -Patient will need outpatient follow up with PCP to discuss lifestyle modifications  Atrial flutter -Currently in SR -Coumadin held -Given PO Vit K on 03/24/15 -INR 1.26  History of moderate aortic stenosis -monitor   History of  duodenal ulcers/GI Bleed -March 2016, h/h have remained stable -Monitor CBC  Normocytic anemia -Baseline hemoglobin 9 -Hemoglobin 10.4 -Continue to monitor CBC  Code Status: Full  Family Communication: None at bedside  Disposition Plan: Admitted, pending catheterization today  Time Spent in minutes   30 minutes  Procedures  Echcoardiogram  Consults   Cardiology  DVT Prophylaxis  Coumadin (held)  Lab Results  Component Value Date   PLT 318 03/26/2015     Medications  Scheduled Meds: . aspirin  81 mg Oral Daily  . azithromycin  500 mg Oral Daily  . carvedilol  6.25 mg Oral BID WC  . cefTRIAXone (ROCEPHIN)  IV  1 g Intravenous Q24H  . methylPREDNISolone (SOLU-MEDROL) injection  60 mg Intravenous Daily  . mometasone-formoterol  2 puff Inhalation BID  . pantoprazole  40 mg Oral Daily  . pravastatin  40 mg Oral QHS  . sodium chloride  3 mL Intravenous Q12H  . sodium chloride  3 mL Intravenous Q12H  . tiotropium  18 mcg Inhalation Daily   Continuous Infusions: . sodium chloride 1 mL/kg/hr (03/26/15 1049)   PRN Meds:.sodium chloride, acetaminophen **OR** acetaminophen, albuterol, morphine injection, ondansetron **OR** ondansetron (ZOFRAN) IV, senna-docusate, sodium chloride  Antibiotics   Anti-infectives    Start     Dose/Rate Route Frequency Ordered Stop   03/25/15 1000  azithromycin (ZITHROMAX) tablet 500 mg     500 mg Oral Daily 03/25/15 0942 03/30/15 0959   03/22/15 1330  cefTRIAXone (ROCEPHIN) 1 g in dextrose 5 % 50 mL IVPB     1 g 100 mL/hr over 30 Minutes Intravenous Every 24 hours 03/22/15 1325 03/29/15 1329   03/22/15 1330  azithromycin (ZITHROMAX) 500 mg in dextrose 5 % 250 mL IVPB  Status:  Discontinued     500 mg 250 mL/hr over 60 Minutes Intravenous Every 24 hours 03/22/15 1325 03/25/15 0942      Subjective:   Ryan Shea seen and examined today. Patient denies current chest pain.  Feels his breathing has improved and is close to his baseline, continues to have productive cough with green sputum  Denies abdominal pain, N/V.    Objective:   Filed Vitals:   03/25/15 2040 03/25/15 2158 03/26/15 0440 03/26/15 0743  BP:  111/50 132/67   Pulse:  93 93   Temp:  97.7 F (36.5 C) 97.6 F (36.4 C)   TempSrc:  Oral    Resp:  18 18   Height:      Weight:   103.2 kg (227 lb 8.2 oz)   SpO2: 97% 98% 98% 96%    Wt Readings from Last 3 Encounters:  03/26/15 103.2 kg (227 lb 8.2 oz)  03/02/15 107.276 kg (236 lb 8  oz)  12/06/14 106.595 kg (235 lb)     Intake/Output Summary (Last 24 hours) at 03/26/15 1208 Last data filed at 03/26/15 1057  Gross per 24 hour  Intake    973 ml  Output   1600 ml  Net   -627 ml    Exam  General: Well developed, well nourished, NAD  HEENT: NCAT,  mucous membranes moist.   Cardiovascular: S1 S2 auscultated, RRR, 1/6 SEM  Respiratory: Diminished, scattered exp wheezing  Abdomen: Soft, obese, nontender, nondistended, + bowel sounds  Extremities: warm dry without cyanosis clubbing or edema  Neuro: AAOx3, nonfocal   Psych: Normal affect and demeanor, pleasant  Data Review   Micro Results Recent Results (from the past 240 hour(s))  Culture, blood (routine x  2) Call MD if unable to obtain prior to antibiotics being given     Status: None (Preliminary result)   Collection Time: 03/22/15  2:04 PM  Result Value Ref Range Status   Specimen Description BLOOD LEFT HAND  Final   Special Requests BOTTLES DRAWN AEROBIC AND ANAEROBIC 4CC EACH  Final   Culture NO GROWTH 4 DAYS  Final   Report Status PENDING  Incomplete  Culture, blood (routine x 2) Call MD if unable to obtain prior to antibiotics being given     Status: None (Preliminary result)   Collection Time: 03/22/15  2:23 PM  Result Value Ref Range Status   Specimen Description BLOOD RIGHT HAND  Final   Special Requests BOTTLES DRAWN AEROBIC AND ANAEROBIC 4CC  Final   Culture NO GROWTH 4 DAYS  Final   Report Status PENDING  Incomplete  MRSA PCR Screening     Status: None   Collection Time: 03/22/15 11:08 PM  Result Value Ref Range Status   MRSA by PCR NEGATIVE NEGATIVE Final    Comment:        The GeneXpert MRSA Assay (FDA approved for NASAL specimens only), is one component of a comprehensive MRSA colonization surveillance program. It is not intended to diagnose MRSA infection nor to guide or monitor treatment for MRSA infections.   Culture, sputum-assessment     Status: None   Collection Time:  03/23/15  9:12 AM  Result Value Ref Range Status   Specimen Description SPUTUM  Final   Special Requests NONE  Final   Sputum evaluation   Final    THIS SPECIMEN IS ACCEPTABLE. RESPIRATORY CULTURE REPORT TO FOLLOW.   Report Status 03/23/2015 FINAL  Final  Culture, respiratory (NON-Expectorated)     Status: None   Collection Time: 03/23/15  9:12 AM  Result Value Ref Range Status   Specimen Description SPUTUM  Final   Special Requests NONE  Final   Gram Stain   Final    ABUNDANT WBC PRESENT,BOTH PMN AND MONONUCLEAR FEW SQUAMOUS EPITHELIAL CELLS PRESENT ABUNDANT GRAM POSITIVE COCCI IN PAIRS ABUNDANT GRAM NEGATIVE COCCI Performed at [facility]    Culture   Final    NORMAL OROPHARYNGEAL FLORA Performed at [facility]    Report Status 03/25/2015 FINAL  Final    Radiology Reports Dg Chest 2 View  03/25/2015  CLINICAL DATA:  Shortness of breath and congestion this morning EXAM: CHEST  2 VIEW COMPARISON:  March 22, 2015 FINDINGS: The heart size and mediastinal contours are stable. There is no focal infiltrate, pulmonary edema, or pleural effusion. Degenerative joint changes of the spine are identified. IMPRESSION: No active cardiopulmonary disease. Electronically Signed   By: Abelardo Diesel M.D.   On: 03/25/2015 10:44   Dg Chest 2 View  03/02/2015  CLINICAL DATA:  Short of breath and congestion. EXAM: CHEST  2 VIEW COMPARISON:  01/06/2015 FINDINGS: Normal mediastinum and cardiac silhouette. Normal pulmonary vasculature. No evidence of effusion, infiltrate, or pneumothorax. No acute bony abnormality. Degenerative osteophytosis of the thoracic spine. IMPRESSION: No active cardiopulmonary disease. Electronically Signed   By: Suzy Bouchard M.D.   On: 03/02/2015 19:09   Dg Chest Portable 1 View  03/22/2015  CLINICAL DATA:  Short of breath.  Tachycardia. EXAM: PORTABLE CHEST 1 VIEW COMPARISON:  03/02/2015. FINDINGS: Cardiac silhouette is normal in size. No mediastinal or  hilar masses or convincing adenopathy. Study is degraded by motion. There is irregular bronchial wall thickening most evident in the right  lung base. This may be chronic accentuated by motion. Acute bronchitis should be considered likely in the proper clinical setting. There is no lung consolidation to suggest pneumonia and no evidence of pulmonary edema. No pleural effusion or pneumothorax. The bony thorax is grossly intact. IMPRESSION: 1. No evidence of pneumonia or pulmonary edema. 2. Bronchial wall thickening to the right lung base. This may reflect acute bronchitis. Electronically Signed   By: Lajean Manes M.D.   On: 03/22/2015 08:07    CBC  Recent Labs Lab 03/22/15 0749 03/23/15 0340 03/24/15 0346 03/25/15 0550 03/26/15 0416  WBC 17.2* 13.0* 19.5* 19.5* 17.2*  HGB 10.6* 9.4* 9.7* 10.0* 10.4*  HCT 32.8* 28.1* 30.3* 30.5* 32.4*  PLT 354 240 296 310 318  MCV 91.6 90.6 89.9 90.2 91.3  MCH 29.6 30.3 28.8 29.6 29.3  MCHC 32.3 33.5 32.0 32.8 32.1  RDW 14.4 14.3 14.0 14.1 14.2  LYMPHSABS 1.7  --   --   --   --   MONOABS 1.1*  --   --   --   --   EOSABS 0.1  --   --   --   --   BASOSABS 0.0  --   --   --   --     Chemistries   Recent Labs Lab 03/22/15 0749 03/23/15 0340 03/24/15 0346 03/25/15 0550 03/26/15 0416  NA 139 138 136 135 136  K 5.1 4.8 5.1 4.7 4.7  CL 106 104 101 99* 99*  CO2 25 27 29 30 31   GLUCOSE 162* 146* 141* 111* 102*  BUN 38* 27* 32* 36* 36*  CREATININE 1.36* 0.92 1.15 1.16 1.19  CALCIUM 8.5* 8.6* 8.5* 8.6* 8.6*  AST 15  --   --   --   --   ALT 17  --   --   --   --   ALKPHOS 62  --   --   --   --   BILITOT 0.6  --   --   --   --    ------------------------------------------------------------------------------------------------------------------ estimated creatinine clearance is 65 mL/min (by C-G formula based on Cr of 1.19). ------------------------------------------------------------------------------------------------------------------ No results  for input(s): HGBA1C in the last 72 hours. ------------------------------------------------------------------------------------------------------------------  Recent Labs  03/26/15 0416  CHOL 166  HDL 46  LDLCALC 106*  TRIG 71  CHOLHDL 3.6   ------------------------------------------------------------------------------------------------------------------ No results for input(s): TSH, T4TOTAL, T3FREE, THYROIDAB in the last 72 hours.  Invalid input(s): FREET3 ------------------------------------------------------------------------------------------------------------------ No results for input(s): VITAMINB12, FOLATE, FERRITIN, TIBC, IRON, RETICCTPCT in the last 72 hours.  Coagulation profile  Recent Labs Lab 03/22/15 1423 03/23/15 0340 03/24/15 0346 03/25/15 0550 03/26/15 0416  INR 2.97* 3.36* 4.23* 1.76* 1.28    No results for input(s): DDIMER in the last 72 hours.  Cardiac Enzymes  Recent Labs Lab 03/22/15 2210 03/23/15 0340 03/23/15 1000  TROPONINI 1.25* 0.84* 0.77*   ------------------------------------------------------------------------------------------------------------------ Invalid input(s): POCBNP    Saphyre Cillo D.O. on 03/26/2015 at 12:08 PM  Between 7am to 7pm - Pager - [phone]  After 7pm go to [email or web address] - password [email or web address]  And look for the night coverage person covering for me after hours  [facility] Office  [phone]

## 2015-03-27 ENCOUNTER — Encounter (HOSPITAL_COMMUNITY): Payer: Self-pay | Admitting: Cardiovascular Disease

## 2015-03-27 LAB — PROTIME-INR
INR: 1.18 (ref 0.00–1.49)
PROTHROMBIN TIME: 15.2 s (ref 11.6–15.2)

## 2015-03-27 LAB — BASIC METABOLIC PANEL
Anion gap: 5 (ref 5–15)
BUN: 28 mg/dL — ABNORMAL HIGH (ref 6–20)
CALCIUM: 8.4 mg/dL — AB (ref 8.9–10.3)
CHLORIDE: 100 mmol/L — AB (ref 101–111)
CO2: 32 mmol/L (ref 22–32)
CREATININE: 1.17 mg/dL (ref 0.61–1.24)
GFR calc Af Amer: >60 mL/min (ref >60)
GFR calc non Af Amer: >60 mL/min (ref >60)
GLUCOSE: 91 mg/dL (ref 65–99)
Potassium: 5 mmol/L (ref 3.5–5.1)
Sodium: 137 mmol/L (ref 135–145)

## 2015-03-27 LAB — CULTURE, BLOOD (ROUTINE X 2)
CULTURE: NO GROWTH
CULTURE: NO GROWTH

## 2015-03-27 LAB — CBC
HCT: 30.3 % — ABNORMAL LOW (ref 39.0–52.0)
Hemoglobin: 9.7 g/dL — ABNORMAL LOW (ref 13.0–17.0)
MCH: 28.6 pg (ref 26.0–34.0)
MCHC: 32 g/dL (ref 30.0–36.0)
MCV: 89.4 fL (ref 78.0–100.0)
Platelets: 316 10*3/uL (ref 150–400)
RBC: 3.39 MIL/uL — ABNORMAL LOW (ref 4.22–5.81)
RDW: 14.1 % (ref 11.5–15.5)
WBC: 14.4 10*3/uL — ABNORMAL HIGH (ref 4.0–10.5)

## 2015-03-27 MED ORDER — PREDNISONE 10 MG PO TABS: ORAL_TABLET | ORAL | Status: DC

## 2015-03-27 MED ORDER — WARFARIN SODIUM 1 MG PO TABS: 3.5000 mg | ORAL_TABLET | Freq: Every day | ORAL | Status: DC

## 2015-03-27 MED ORDER — ASPIRIN 81 MG PO CHEW: 81.0000 mg | CHEWABLE_TABLET | Freq: Every day | ORAL | Status: AC

## 2015-03-27 MED ORDER — FUROSEMIDE 40 MG PO TABS: 40.0000 mg | ORAL_TABLET | Freq: Every day | ORAL | Status: DC | PRN

## 2015-03-27 MED ORDER — ZOLPIDEM TARTRATE 10 MG PO TABS: 5.0000 mg | ORAL_TABLET | Freq: Every evening | ORAL | Status: DC | PRN

## 2015-03-27 MED ORDER — CEPHALEXIN 500 MG PO CAPS: 500.0000 mg | ORAL_CAPSULE | Freq: Two times a day (BID) | ORAL | Status: DC

## 2015-03-27 MED FILL — Verapamil HCl IV Soln 2.5 MG/ML: INTRAVENOUS | Qty: 2 | Status: AC

## 2015-03-27 NOTE — Progress Notes (Signed)
SUBJECTIVE:  No chest pain.  Wants to go home.     PHYSICAL EXAM Filed Vitals:   03/27/15 0124 03/27/15 0405 03/27/15 0707 03/27/15 0811  BP: 111/47 131/85  139/67  Pulse: 86 93 92 106  Temp: 98.2 F (36.8 C) 97.5 F (36.4 C)  98.4 F (36.9 C)  TempSrc: Oral Oral  Oral  Resp: 18 20 20 20   Height:      Weight:  231 lb 12.8 oz (105.144 kg)    SpO2: 95% 97% 96% 98%   General:  No distress Lungs:  Decreased breath sounds Heart:  RRR Abdomen:  Positive bowel sounds, no rebound no guarding Extremities:  Trace edema , right femoral without bleeding and only mild bruising.   LABS:  Results for orders placed or performed during the hospital encounter of 03/22/15 (from the past 24 hour(s))  I-STAT 3, venous blood gas (G3P V)     Status: Abnormal   Collection Time: 03/26/15  2:17 PM  Result Value Ref Range   pH, Ven 7.348 (H) 7.250 - 7.300   pCO2, Ven 55.2 (H) 45.0 - 50.0 mmHg   pO2, Ven 107.0 (H) 30.0 - 45.0 mmHg   Bicarbonate 30.4 (H) 20.0 - 24.0 mEq/L   TCO2 32 0 - 100 mmol/L   O2 Saturation 98.0 %   Acid-Base Excess 4.0 (H) 0.0 - 2.0 mmol/L   Patient temperature HIDE    Sample type VENOUS   I-STAT 3, venous blood gas (G3P V)     Status: Abnormal   Collection Time: 03/26/15  2:20 PM  Result Value Ref Range   pH, Ven 7.421 (H) 7.250 - 7.300   pCO2, Ven 44.1 (L) 45.0 - 50.0 mmHg   pO2, Ven 56.0 (H) 30.0 - 45.0 mmHg   Bicarbonate 28.6 (H) 20.0 - 24.0 mEq/L   TCO2 30 0 - 100 mmol/L   O2 Saturation 89.0 %   Acid-Base Excess 4.0 (H) 0.0 - 2.0 mmol/L   Patient temperature HIDE    Sample type VENOUS   I-STAT 3, venous blood gas (G3P V)     Status: Abnormal   Collection Time: 03/26/15  2:25 PM  Result Value Ref Range   pH, Ven 7.629 (HH) 7.250 - 7.300   pCO2, Ven 25.7 (L) 45.0 - 50.0 mmHg   pO2, Ven 212.0 (H) 30.0 - 45.0 mmHg   Bicarbonate 27.0 (H) 20.0 - 24.0 mEq/L   TCO2 28 0 - 100 mmol/L   O2 Saturation 100.0 %   Acid-Base Excess 6.0 (H) 0.0 - 2.0 mmol/L   Patient  temperature HIDE    Sample type VENOUS    Comment NOTIFIED PHYSICIAN   I-STAT 3, venous blood gas (G3P V)     Status: Abnormal   Collection Time: 03/26/15  2:31 PM  Result Value Ref Range   pH, Ven 7.305 (H) 7.250 - 7.300   pCO2, Ven 64.0 (H) 45.0 - 50.0 mmHg   pO2, Ven 39.0 30.0 - 45.0 mmHg   Bicarbonate 31.9 (H) 20.0 - 24.0 mEq/L   TCO2 34 0 - 100 mmol/L   O2 Saturation 67.0 %   Acid-Base Excess 4.0 (H) 0.0 - 2.0 mmol/L   Patient temperature HIDE    Sample type VENOUS    Comment NOTIFIED PHYSICIAN   Protime-INR     Status: None   Collection Time: 03/27/15  6:04 AM  Result Value Ref Range   Prothrombin Time 15.2 11.6 - 15.2 seconds   INR 1.18 0.00 -  99991111  Basic metabolic panel     Status: Abnormal   Collection Time: 03/27/15  6:04 AM  Result Value Ref Range   Sodium 137 135 - 145 mmol/L   Potassium 5.0 3.5 - 5.1 mmol/L   Chloride 100 (L) 101 - 111 mmol/L   CO2 32 22 - 32 mmol/L   Glucose, Bld 91 65 - 99 mg/dL   BUN 28 (H) 6 - 20 mg/dL   Creatinine, Ser 1.17 0.61 - 1.24 mg/dL   Calcium 8.4 (L) 8.9 - 10.3 mg/dL   GFR calc non Af Amer >60 >60 mL/min   GFR calc Af Amer >60 >60 mL/min   Anion gap 5 5 - 15  CBC     Status: Abnormal   Collection Time: 03/27/15  6:04 AM  Result Value Ref Range   WBC 14.4 (H) 4.0 - 10.5 K/uL   RBC 3.39 (L) 4.22 - 5.81 MIL/uL   Hemoglobin 9.7 (L) 13.0 - 17.0 g/dL   HCT 30.3 (L) 39.0 - 52.0 %   MCV 89.4 78.0 - 100.0 fL   MCH 28.6 26.0 - 34.0 pg   MCHC 32.0 30.0 - 36.0 g/dL   RDW 14.1 11.5 - 15.5 %   Platelets 316 150 - 400 K/uL    Intake/Output Summary (Last 24 hours) at 03/27/15 1037 Last data filed at 03/27/15 0811  Gross per 24 hour  Intake   1030 ml  Output   1850 ml  Net   -820 ml     ASSESSMENT AND PLAN:  NSTEMI:    No obstructive CAD.  Demand ischemia.    AORTIC STENOSIS:   Severe stenosis.  No further plans for in patient work up.  Will follow up as an outpatient.   ATRIAL FLUTTER:  Restart warfarin per pharmacy.     CKD:    Creat is stable  ANEMIA:  H/H relatively stable No evidence of active bleeding.     Could go home from a cardiac standpoint when he is felt to be ready per the primary team.    Minus Breeding 03/27/2015 10:37 AM

## 2015-03-27 NOTE — Discharge Summary (Addendum)
Discharge Summary  Ryan Shea [id number] DOB: [birth year]  PCP: Bronson Curb, PA-C  Admit date: 03/22/2015 Discharge date: 03/27/2015  Time spent: <70mins  Recommendations for Outpatient Follow-up:  1. F/u with PMD within a week 2. F/u with cardiology in a month  Discharge Diagnoses:  Active Hospital Problems   Diagnosis Date Noted  . Acute on chronic respiratory failure with hypoxia ([place]) 03/22/2015  . SOB (shortness of breath)   . Anemia, unspecified 03/25/2015  . NSTEMI (non-ST elevated myocardial infarction) ([place]) 03/22/2015  . CAP (community acquired pneumonia) 03/22/2015  . Leukocytosis 03/22/2015  . CKD (chronic kidney disease) stage 3, GFR 30-59 ml/min 03/22/2015  . Morbid obesity ([place]) 03/22/2015  . Acute on chronic respiratory failure with hypoxemia ([place]) 03/22/2015  . Elevated troponin   . COPD exacerbation ([place]) 03/02/2015  . Paroxysmal atrial flutter ([place]) 04/23/2013  . Aortic stenosis 04/23/2013  . HTN (hypertension) 04/20/2013    Resolved Hospital Problems   Diagnosis Date Noted Date Resolved  No resolved problems to display.    Discharge Condition: stable  Diet recommendation: heart healthy  Filed Weights   03/25/15 0514 03/26/15 0440 03/27/15 0405  Weight: 105.7 kg (233 lb 0.4 oz) 103.2 kg (227 lb 8.2 oz) 105.144 kg (231 lb 12.8 oz)    History of present illness:  Patient is a 70 year old gentleman with multiple medical comorbidities including history of duodenal ulcers, hypertension, COPD on chronic oxygen, who presents to the hospital today with a 3 to four-day duration of shortness of breath. Unfortunately history is limited as patient is currently on BiPAP and no family members are available. He can however answer yes or no to my questions. He does say that he had some chest pain that started yesterday and points to the substernal area when asked where it hurts. He denies any radiation of the pain. His wife made him call EMS today  given his severe shortness of breath. Upon arrival to the emergency department he was noted to have significant wheezing and was given several nebulizer treatments as well as steroids. Was placed on BiPAP given hypoxemia and work of breathing. Within his workup in the emergency department he was found to have leukocytosis, troponin of 0.28, chest x-ray that shows bronchial wall thickening of the right lung base that likely represents acute bronchitis. EKG that has been independently reviewed that initially showed a very rapid rate and what appear to be possible ST elevation in some lateral and inferior leads. EDP has discussed case with cardiologist at [facility], Dr. Haroldine Laws, who does not believe this represents an acute MI, however given severity of respiratory failure, possibility of MI and lack of cardiology or pulmonary coverage this weekend, it has been decided to transfer patient to [facility] for further care.  Interim history Admitted for resp failure/COPD exac, NSTEMI. Cardiac cath 1/18, no CAD.  Hospital Course:  Principal Problem:   Acute on chronic respiratory failure with hypoxia (HCC) Active Problems:   HTN (hypertension)   Aortic stenosis   Paroxysmal atrial flutter (HCC)   COPD exacerbation (HCC)   NSTEMI (non-ST elevated myocardial infarction) ([place])   CAP (community acquired pneumonia)   Leukocytosis   CKD (chronic kidney disease) stage 3, GFR 30-59 ml/min   Morbid obesity (HCC)   Acute on chronic respiratory failure with hypoxemia (HCC)   Elevated troponin   Anemia, unspecified   SOB (shortness of breath)  Elevated troponins/NSTEMI -Echocardiogram 0000000, grade 1 diastolic dysfunction -Peak tropoinin 1.41, trending downward -Plan  for cardiac cath once INR less than 1.8 -Right/left heart cath 1/18, no CAD -appreciate cardiology input, patient is cleared to discharge home by cardiology.  Acute on chronic respiratory failure with hypoxemia/COPD Exac --Chronic  component due to COPD, acute component due to possibly pneumonia/bronchitis as well as exacerbation of COPD. -Continue nebs, steroids, Spiriva  -Transition to oral prednisone after cath -better on room air  Acute bronchitis/pneumonia -received Rocephin/azithromycin since admission -Blood cultures show no growth to date -Sputum culture unremarkable -Strep pneumonia urine antigen positive -Legionella urine Ag negative -Influenza PCR negative -CXR shows no evidence of pna or pulm edema -CXR 03/25/15: No active cardiopulm disease -better, discharge with keflex and steroid taper.  Chronic kidney disease stage III -Creatinine appears to be at baseline, currently 1.17  Leukocytosis -Likely secondary to acute respiratory illness vs steroids, monitor. -Trending downward  Morbid obesity -Patient will need outpatient follow up with PCP to discuss lifestyle modifications  Atrial flutter -Currently in SR- -Given PO Vit K on 03/24/15 in anticipation for cardiac cath -Coumadin held for cardiac cath and resumed ( has been taking 3.5mg  daily at home), pmd to monitor INR level  History of moderate aortic stenosis -monitor , outpatient cardiology follow up  History of duodenal ulcers/GI Bleed -March 2016, h/h have remained stable -continue ppi  Normocytic anemia -Baseline hemoglobin 9 -Hemoglobin 10.4 -continue iron supplement  Code Status: Full  Family Communication: wife and daughter in room  Disposition Plan: home on 1/19  Procedures:  Cardiac cath 1/18  Consultations:  cardioloyg  Discharge Exam: BP 139/67 mmHg  Pulse 106  Temp(Src) 98.4 F (36.9 C) (Oral)  Resp 20  Ht 5\' 6"  (1.676 m)  Wt 105.144 kg (231 lb 12.8 oz)  BMI 37.43 kg/m2  SpO2 98%   General: Well developed, well nourished, NAD  HEENT: NCAT, mucous membranes moist.   Cardiovascular: S1 S2 auscultated, RRR, 1/6 SEM  Respiratory: Diminished, scattered exp wheezing  Abdomen: Soft, obese,  nontender, nondistended, + bowel sounds  Extremities: warm dry without cyanosis clubbing or edema  Neuro: AAOx3, nonfocal  Psych: Normal affect and demeanor, pleasant     Discharge Instructions You were cared for by a hospitalist during your hospital stay. If you have any questions about your discharge medications or the care you received while you were in the hospital after you are discharged, you can call the unit and asked to speak with the hospitalist on call if the hospitalist that took care of you is not available. Once you are discharged, your primary care physician will handle any further medical issues. Please note that NO REFILLS for any discharge medications will be authorized once you are discharged, as it is imperative that you return to your primary care physician (or establish a relationship with a primary care physician if you do not have one) for your aftercare needs so that they can reassess your need for medications and monitor your lab values.      Discharge Instructions    Diet - low sodium heart healthy    Complete by:  As directed      Increase activity slowly    Complete by:  As directed             Medication List    STOP taking these medications        levofloxacin 500 MG tablet  Commonly known as:  LEVAQUIN      TAKE these medications        allopurinol 100 MG tablet  Commonly known as:  ZYLOPRIM  Take 1 tablet by mouth daily.     aspirin 81 MG chewable tablet  Chew 1 tablet (81 mg total) by mouth daily.     carvedilol 6.25 MG tablet  Commonly known as:  COREG  Take 1 tablet (6.25 mg total) by mouth 2 (two) times daily with a meal.     cephALEXin 500 MG capsule  Commonly known as:  KEFLEX  Take 1 capsule (500 mg total) by mouth 2 (two) times daily.  Notes to Patient:  Take 1 as soon as script filled, and take 2nd dose tonight.      colchicine 0.6 MG tablet  Take 1 tablet (0.6 mg total) by mouth daily.     ferrous sulfate 325 (65 FE) MG  tablet  Take 325 mg by mouth at bedtime.     Fluticasone-Salmeterol 250-50 MCG/DOSE Aepb  Commonly known as:  ADVAIR DISKUS  Inhale 1 puff into the lungs daily.     furosemide 40 MG tablet  Commonly known as:  LASIX  Take 1 tablet (40 mg total) by mouth daily as needed for fluid or edema.     gabapentin 300 MG capsule  Commonly known as:  NEURONTIN  Take 300 mg by mouth 3 (three) times daily as needed (for pain/neuropathy).     guaifenesin 100 MG/5ML syrup  Commonly known as:  ROBITUSSIN  Take 100 mg by mouth 3 (three) times daily as needed for congestion.     lisinopril 5 MG tablet  Commonly known as:  PRINIVIL,ZESTRIL  Take 5 mg by mouth daily.     pantoprazole 40 MG tablet  Commonly known as:  PROTONIX  Take 1 tablet (40 mg total) by mouth daily.     pravastatin 40 MG tablet  Commonly known as:  PRAVACHOL  Take 1 tablet by mouth at bedtime.     predniSONE 10 MG tablet  Commonly known as:  DELTASONE  Take 40mg  po daily for 2 days then 30mg  daily for 2 days then 20mg  daily for 2 days then 10mg  daily then stop     tiotropium 18 MCG inhalation capsule  Commonly known as:  SPIRIVA HANDIHALER  Place 1 capsule (18 mcg total) into inhaler and inhale daily.     VENTOLIN HFA 108 (90 Base) MCG/ACT inhaler  Generic drug:  albuterol  Inhale 1 puff into the lungs every 6 (six) hours as needed for wheezing or shortness of breath.     albuterol (2.5 MG/3ML) 0.083% nebulizer solution  Commonly known as:  PROVENTIL  Take 3 mLs (2.5 mg total) by nebulization every 4 (four) hours as needed for wheezing or shortness of breath.     warfarin 1 MG tablet  Commonly known as:  COUMADIN  Take 3.5 tablets (3.5 mg total) by mouth daily at 6 PM.     zolpidem 10 MG tablet  Commonly known as:  AMBIEN  Take 0.5 tablets (5 mg total) by mouth at bedtime as needed for sleep.       No Known Allergies Follow-up Information    Follow up with Jory Sims, NP On 04/04/2015.   Specialties:   Nurse Practitioner, Radiology, Cardiology   Why:  cardiology follow up for aflutter and aortic stenosis @1 :00pm   Contact information:   [place] [id number] [phone]       Follow up with Shade Flood, MD. Go on 04/03/2015.   Specialty:  Family Medicine   Why:  @  11:30am   Contact information:   [address] [place] [address] [phone]       Follow up with coumadin clinic.   Contact information:   f/u wiht coumadin clinic in three days       The results of significant diagnostics from this hospitalization (including imaging, microbiology, ancillary and laboratory) are listed below for reference.    Significant Diagnostic Studies: Dg Chest 2 View  03/25/2015  CLINICAL DATA:  Shortness of breath and congestion this morning EXAM: CHEST  2 VIEW COMPARISON:  March 22, 2015 FINDINGS: The heart size and mediastinal contours are stable. There is no focal infiltrate, pulmonary edema, or pleural effusion. Degenerative joint changes of the spine are identified. IMPRESSION: No active cardiopulmonary disease. Electronically Signed   By: Abelardo Diesel M.D.   On: 03/25/2015 10:44   Dg Chest 2 View  03/02/2015  CLINICAL DATA:  Short of breath and congestion. EXAM: CHEST  2 VIEW COMPARISON:  01/06/2015 FINDINGS: Normal mediastinum and cardiac silhouette. Normal pulmonary vasculature. No evidence of effusion, infiltrate, or pneumothorax. No acute bony abnormality. Degenerative osteophytosis of the thoracic spine. IMPRESSION: No active cardiopulmonary disease. Electronically Signed   By: Suzy Bouchard M.D.   On: 03/02/2015 19:09   Dg Chest Portable 1 View  03/22/2015  CLINICAL DATA:  Short of breath.  Tachycardia. EXAM: PORTABLE CHEST 1 VIEW COMPARISON:  03/02/2015. FINDINGS: Cardiac silhouette is normal in size. No mediastinal or hilar masses or convincing adenopathy. Study is degraded by motion. There is irregular bronchial wall thickening most evident in the  right lung base. This may be chronic accentuated by motion. Acute bronchitis should be considered likely in the proper clinical setting. There is no lung consolidation to suggest pneumonia and no evidence of pulmonary edema. No pleural effusion or pneumothorax. The bony thorax is grossly intact. IMPRESSION: 1. No evidence of pneumonia or pulmonary edema. 2. Bronchial wall thickening to the right lung base. This may reflect acute bronchitis. Electronically Signed   By: Lajean Manes M.D.   On: 03/22/2015 08:07    Microbiology: Recent Results (from the past 240 hour(s))  Culture, blood (routine x 2) Call MD if unable to obtain prior to antibiotics being given     Status: None   Collection Time: 03/22/15  2:04 PM  Result Value Ref Range Status   Specimen Description BLOOD LEFT HAND  Final   Special Requests BOTTLES DRAWN AEROBIC AND ANAEROBIC 4CC EACH  Final   Culture NO GROWTH 5 DAYS  Final   Report Status 03/27/2015 FINAL  Final  Culture, blood (routine x 2) Call MD if unable to obtain prior to antibiotics being given     Status: None   Collection Time: 03/22/15  2:23 PM  Result Value Ref Range Status   Specimen Description BLOOD RIGHT HAND  Final   Special Requests BOTTLES DRAWN AEROBIC AND ANAEROBIC 4CC  Final   Culture NO GROWTH 5 DAYS  Final   Report Status 03/27/2015 FINAL  Final  MRSA PCR Screening     Status: None   Collection Time: 03/22/15 11:08 PM  Result Value Ref Range Status   MRSA by PCR NEGATIVE NEGATIVE Final    Comment:        The GeneXpert MRSA Assay (FDA approved for NASAL specimens only), is one component of a comprehensive MRSA colonization surveillance program. It is not intended to diagnose MRSA infection nor to guide or monitor treatment for MRSA infections.   Culture, sputum-assessment  Status: None   Collection Time: 03/23/15  9:12 AM  Result Value Ref Range Status   Specimen Description SPUTUM  Final   Special Requests NONE  Final   Sputum  evaluation   Final    THIS SPECIMEN IS ACCEPTABLE. RESPIRATORY CULTURE REPORT TO FOLLOW.   Report Status 03/23/2015 FINAL  Final  Culture, respiratory (NON-Expectorated)     Status: None   Collection Time: 03/23/15  9:12 AM  Result Value Ref Range Status   Specimen Description SPUTUM  Final   Special Requests NONE  Final   Gram Stain   Final    ABUNDANT WBC PRESENT,BOTH PMN AND MONONUCLEAR FEW SQUAMOUS EPITHELIAL CELLS PRESENT ABUNDANT GRAM POSITIVE COCCI IN PAIRS ABUNDANT GRAM NEGATIVE COCCI Performed at [facility]    Culture   Final    NORMAL OROPHARYNGEAL FLORA Performed at [facility]    Report Status 03/25/2015 FINAL  Final     Labs: Basic Metabolic Panel:  Recent Labs Lab 03/23/15 0340 03/24/15 0346 03/25/15 0550 03/26/15 0416 03/27/15 0604  NA 138 136 135 136 137  K 4.8 5.1 4.7 4.7 5.0  CL 104 101 99* 99* 100*  CO2 27 29 30 31  32  GLUCOSE 146* 141* 111* 102* 91  BUN 27* 32* 36* 36* 28*  CREATININE 0.92 1.15 1.16 1.19 1.17  CALCIUM 8.6* 8.5* 8.6* 8.6* 8.4*   Liver Function Tests:  Recent Labs Lab 03/22/15 0749  AST 15  ALT 17  ALKPHOS 62  BILITOT 0.6  PROT 6.8  ALBUMIN 3.1*   No results for input(s): LIPASE, AMYLASE in the last 168 hours. No results for input(s): AMMONIA in the last 168 hours. CBC:  Recent Labs Lab 03/22/15 0749 03/23/15 0340 03/24/15 0346 03/25/15 0550 03/26/15 0416 03/27/15 0604  WBC 17.2* 13.0* 19.5* 19.5* 17.2* 14.4*  NEUTROABS 14.3*  --   --   --   --   --   HGB 10.6* 9.4* 9.7* 10.0* 10.4* 9.7*  HCT 32.8* 28.1* 30.3* 30.5* 32.4* 30.3*  MCV 91.6 90.6 89.9 90.2 91.3 89.4  PLT 354 240 296 310 318 316   Cardiac Enzymes:  Recent Labs Lab 03/22/15 1423 03/22/15 1956 03/22/15 2210 03/23/15 0340 03/23/15 1000  TROPONINI 1.40* 1.41* 1.25* 0.84* 0.77*   BNP: BNP (last 3 results) No results for input(s): BNP in the last 8760 hours.  ProBNP (last 3 results) No results for input(s): PROBNP in the  last 8760 hours.  CBG: No results for input(s): GLUCAP in the last 168 hours.     SignedFlorencia Reasons MD, PhD  [facility] 03/27/2015, 1:46 PM

## 2015-03-27 NOTE — Progress Notes (Signed)
D/c instructions reviewed with pt, wife and daughter. Copy of instructions and 2 scripts given to pt, other scripts were sent in electronically by MD to pt's pharmacy. Coumadin home dose clarified with pt, pt taking 3.5mg  daily at home PTA, Dr Erlinda Hong notified and d/c instructions were changed, pt to follow up with coumadin clinic on Monday--pt states he will call and go on Monday.  Pt d/c'd via wheelchair with belongings, with family, escorted by hospital volunteer.

## 2015-03-27 NOTE — Progress Notes (Signed)
Pt refusing bed alarm, right groin site level 0, pt A/Ox4, per report pt steady on feet

## 2015-03-27 NOTE — Care Management Note (Signed)
Case Management Note  Patient Details  Name: RAHKEEM TORBECK MRN: [id number] Date of Birth: 1945/09/13  Subjective/Objective:    Acute Resp Failure, HTN, NSTEMI                Action/Plan:  NCM spoke to pt and wife, Enrrique Puri at bedside. Pt states he is independent at home. Able to afford his medications. No DME requested.   Expected Discharge Date:  03/27/2015              Expected Discharge Plan:  Home/Self Care  In-House Referral:  NA  Discharge planning Services  CM Consult  Post Acute Care Choice:  NA Choice offered to:  NA  DME Arranged:  N/A DME Agency:  NA  HH Arranged:  NA HH Agency:  NA  Status of Service:  Completed, signed off  Medicare Important Message Given:  Yes Date Medicare IM Given:    Medicare IM give by:    Date Additional Medicare IM Given:    Additional Medicare Important Message give by:     If discussed at [place] of Stay Meetings, dates discussed:    Additional Comments:  Erenest Rasher, RN 03/27/2015, 12:21 PM

## 2015-03-31 LAB — PROTIME-INR
INR: 1.7 — AB (ref 0.9–1.1)
INR: 1.7 — AB (ref 0.9–1.1)

## 2015-04-03 LAB — PROTIME-INR: INR: 3.3 — AB (ref 0.9–1.1)

## 2015-04-04 ENCOUNTER — Ambulatory Visit: Payer: Medicare PPO | Admitting: Adult Health

## 2015-04-08 ENCOUNTER — Encounter: Payer: Medicare PPO | Admitting: Adult Health

## 2015-04-08 NOTE — Progress Notes (Signed)
Cardiology Office Note   Date:  04/08/2015   ID:  Ryan Shea, Ryan Shea Apr 03, 1945, MRN [id number]  PCP:  Bronson Curb, PA-C  Cardiologist: Woodroe Chen, NP   ERROR Cancelled appointment

## 2015-04-09 ENCOUNTER — Other Ambulatory Visit (HOSPITAL_COMMUNITY): Payer: Self-pay | Admitting: Family Medicine

## 2015-04-09 DIAGNOSIS — N183 Chronic kidney disease, stage 3 unspecified: Secondary | ICD-10-CM

## 2015-04-09 DIAGNOSIS — E790 Hyperuricemia without signs of inflammatory arthritis and tophaceous disease: Secondary | ICD-10-CM

## 2015-04-09 LAB — PROTIME-INR
INR: 3.3 — AB (ref 0.9–1.1)
INR: 4.2 — AB (ref 0.9–1.1)

## 2015-04-14 ENCOUNTER — Encounter: Payer: Self-pay | Admitting: Adult Health

## 2015-04-14 ENCOUNTER — Ambulatory Visit (INDEPENDENT_AMBULATORY_CARE_PROVIDER_SITE_OTHER): Payer: Medicare PPO | Admitting: Adult Health

## 2015-04-14 VITALS — BP 118/50 | HR 110 | Ht 66.0 in | Wt 240.0 lb

## 2015-04-14 DIAGNOSIS — I350 Nonrheumatic aortic (valve) stenosis: Secondary | ICD-10-CM

## 2015-04-14 DIAGNOSIS — N183 Chronic kidney disease, stage 3 (moderate): Secondary | ICD-10-CM | POA: Diagnosis not present

## 2015-04-14 NOTE — Progress Notes (Signed)
Name: Ryan Shea    DOB: 04-23-1945  Age: 70 y.o.  MR#: [id number]       PCP:  Bronson Curb, PA-C      Insurance: Payor: Mcarthur Rossetti MEDICARE / Plan: HUMANA MEDICARE CHOICE PPO / Product Type: *No Product type* /   CC:   No chief complaint on file.   VS Filed Vitals:   04/14/15 1536  BP: 118/50  Pulse: 110  Height: 5\' 6"  (1.676 m)  Weight: 240 lb (108.863 kg)  SpO2: 95%    Weights Current Weight  04/14/15 240 lb (108.863 kg)  03/27/15 231 lb 12.8 oz (105.144 kg)  03/02/15 236 lb 8 oz (107.276 kg)    Blood Pressure  BP Readings from Last 3 Encounters:  04/14/15 118/50  03/27/15 139/67  03/03/15 126/64     Admit date:  (Not on file) Last encounter with RMR:  11/21/2014   Allergy Review of patient's allergies indicates no known allergies.  Current Outpatient Prescriptions  Medication Sig Dispense Refill  . albuterol (PROVENTIL) (2.5 MG/3ML) 0.083% nebulizer solution Take 3 mLs (2.5 mg total) by nebulization every 4 (four) hours as needed for wheezing or shortness of breath. 75 mL 12  . allopurinol (ZYLOPRIM) 100 MG tablet Take 1 tablet by mouth daily.    Marland Kitchen aspirin 81 MG chewable tablet Chew 1 tablet (81 mg total) by mouth daily. 30 tablet 0  . carvedilol (COREG) 6.25 MG tablet Take 1 tablet (6.25 mg total) by mouth 2 (two) times daily with a meal. 180 tablet 3  . colchicine 0.6 MG tablet Take 1 tablet (0.6 mg total) by mouth daily. 20 tablet 0  . ferrous sulfate 325 (65 FE) MG tablet Take 325 mg by mouth at bedtime.     . Fluticasone-Salmeterol (ADVAIR DISKUS) 250-50 MCG/DOSE AEPB Inhale 1 puff into the lungs daily. 60 each 1  . furosemide (LASIX) 40 MG tablet Take 1 tablet (40 mg total) by mouth daily as needed for fluid or edema. 30 tablet 0  . gabapentin (NEURONTIN) 300 MG capsule Take 300 mg by mouth 3 (three) times daily as needed (for pain/neuropathy).     Marland Kitchen guaifenesin (ROBITUSSIN) 100 MG/5ML syrup Take 100 mg by mouth 3 (three) times daily as needed for  congestion.    . pantoprazole (PROTONIX) 40 MG tablet Take 1 tablet (40 mg total) by mouth daily. 60 tablet 5  . pravastatin (PRAVACHOL) 40 MG tablet Take 1 tablet by mouth at bedtime.     Marland Kitchen tiotropium (SPIRIVA HANDIHALER) 18 MCG inhalation capsule Place 1 capsule (18 mcg total) into inhaler and inhale daily. 30 capsule 12  . VENTOLIN HFA 108 (90 BASE) MCG/ACT inhaler Inhale 1 puff into the lungs every 6 (six) hours as needed for wheezing or shortness of breath.     . warfarin (COUMADIN) 1 MG tablet Take 3.5 tablets (3.5 mg total) by mouth daily at 6 PM. 60 tablet 0  . zolpidem (AMBIEN) 10 MG tablet Take 0.5 tablets (5 mg total) by mouth at bedtime as needed for sleep. 30 tablet 0  . lisinopril (PRINIVIL,ZESTRIL) 5 MG tablet Take 5 mg by mouth daily. Reported on 04/14/2015     No current facility-administered medications for this visit.    Discontinued Meds:    Medications Discontinued During This Encounter  Medication Reason  . cephALEXin (KEFLEX) 500 MG capsule Error  . predniSONE (DELTASONE) 10 MG tablet Error    Patient Active Problem List   Diagnosis Date Noted  .  SOB (shortness of breath)   . Anemia, unspecified 03/25/2015  . Acute on chronic respiratory failure with hypoxia ([place]) 03/22/2015  . NSTEMI (non-ST elevated myocardial infarction) ([place]) 03/22/2015  . CAP (community acquired pneumonia) 03/22/2015  . Leukocytosis 03/22/2015  . CKD (chronic kidney disease) stage 3, GFR 30-59 ml/min 03/22/2015  . Morbid obesity ([place]) 03/22/2015  . Acute on chronic respiratory failure with hypoxemia ([place]) 03/22/2015  . Elevated troponin   . COPD with acute exacerbation ([place]) 03/02/2015  . COPD exacerbation ([place]) 03/02/2015  . Acute renal failure ([place]) 08/24/2014  . Chronic anticoagulation 08/24/2014  . GI bleed 08/23/2014  . Anemia 08/23/2014  . Rectal bleeding 08/23/2014  . Colon cancer screening 07/11/2014  . Multiple duodenal ulcers 05/30/2014  . Aortic stenosis 04/23/2013  .  Mitral regurgitation 04/23/2013  . Cardiomyopathy ([place]) 04/23/2013  . Paroxysmal atrial flutter ([place]) 04/23/2013  . HTN (hypertension) 04/20/2013  . High cholesterol 04/20/2013  . COPD (chronic obstructive pulmonary disease) ([place]) 04/20/2013  . Prostate cancer ([place]) 04/20/2013  . Backache, unspecified 04/20/2013  . Body mass index 40.0-44.9, adult (HCC) 04/20/2013    LABS    Component Value Date/Time   NA 137 03/27/2015 0604   NA 136 03/26/2015 0416   NA 135 03/25/2015 0550   K 5.0 03/27/2015 0604   K 4.7 03/26/2015 0416   K 4.7 03/25/2015 0550   CL 100* 03/27/2015 0604   CL 99* 03/26/2015 0416   CL 99* 03/25/2015 0550   CO2 32 03/27/2015 0604   CO2 31 03/26/2015 0416   CO2 30 03/25/2015 0550   GLUCOSE 91 03/27/2015 0604   GLUCOSE 102* 03/26/2015 0416   GLUCOSE 111* 03/25/2015 0550   BUN 28* 03/27/2015 0604   BUN 36* 03/26/2015 0416   BUN 36* 03/25/2015 0550   CREATININE 1.17 03/27/2015 0604   CREATININE 1.19 03/26/2015 0416   CREATININE 1.16 03/25/2015 0550   CALCIUM 8.4* 03/27/2015 0604   CALCIUM 8.6* 03/26/2015 0416   CALCIUM 8.6* 03/25/2015 0550   GFRNONAA >60 03/27/2015 0604   GFRNONAA >60 03/26/2015 0416   GFRNONAA >60 03/25/2015 0550   GFRAA >60 03/27/2015 0604   GFRAA >60 03/26/2015 0416   GFRAA >60 03/25/2015 0550   CMP     Component Value Date/Time   NA 137 03/27/2015 0604   K 5.0 03/27/2015 0604   CL 100* 03/27/2015 0604   CO2 32 03/27/2015 0604   GLUCOSE 91 03/27/2015 0604   BUN 28* 03/27/2015 0604   CREATININE 1.17 03/27/2015 0604   CALCIUM 8.4* 03/27/2015 0604   PROT 6.8 03/22/2015 0749   ALBUMIN 3.1* 03/22/2015 0749   AST 15 03/22/2015 0749   ALT 17 03/22/2015 0749   ALKPHOS 62 03/22/2015 0749   BILITOT 0.6 03/22/2015 0749   GFRNONAA >60 03/27/2015 0604   GFRAA >60 03/27/2015 0604       Component Value Date/Time   WBC 14.4* 03/27/2015 0604   WBC 17.2* 03/26/2015 0416   WBC 19.5* 03/25/2015 0550   HGB 9.7* 03/27/2015 0604   HGB  10.4* 03/26/2015 0416   HGB 10.0* 03/25/2015 0550   HCT 30.3* 03/27/2015 0604   HCT 32.4* 03/26/2015 0416   HCT 30.5* 03/25/2015 0550   MCV 89.4 03/27/2015 0604   MCV 91.3 03/26/2015 0416   MCV 90.2 03/25/2015 0550    Lipid Panel     Component Value Date/Time   CHOL 166 03/26/2015 0416   TRIG 71 03/26/2015 0416   HDL 46 03/26/2015 0416   CHOLHDL  3.6 03/26/2015 0416   VLDL 14 03/26/2015 0416   LDLCALC 106* 03/26/2015 0416    ABG    Component Value Date/Time   HCO3 31.9* 03/26/2015 1431   TCO2 34 03/26/2015 1431   O2SAT 67.0 03/26/2015 1431     No results found for: TSH BNP (last 3 results) No results for input(s): BNP in the last 8760 hours.  ProBNP (last 3 results) No results for input(s): PROBNP in the last 8760 hours.  Cardiac Panel (last 3 results) No results for input(s): CKTOTAL, CKMB, TROPONINI, RELINDX in the last 72 hours.  Iron/TIBC/Ferritin/ %Sat No results found for: IRON, TIBC, FERRITIN, IRONPCTSAT   EKG Orders placed or performed during the hospital encounter of 03/22/15  . EKG 12-Lead  . EKG 12-Lead  . EKG 12-Lead  . EKG 12-Lead  . EKG 12-Lead  . EKG 12-Lead  . EKG 12-Lead  . EKG 12-Lead  . EKG     Prior Assessment and Plan Problem List as of 04/14/2015      Cardiovascular and Mediastinum   HTN (hypertension)   Aortic stenosis   Mitral regurgitation   Cardiomyopathy (HCC)   Paroxysmal atrial flutter (HCC)   NSTEMI (non-ST elevated myocardial infarction) ([place])     Respiratory   COPD (chronic obstructive pulmonary disease) ([place])   COPD with acute exacerbation (HCC)   COPD exacerbation (HCC)   Acute on chronic respiratory failure with hypoxia ([place])   CAP (community acquired pneumonia)   Acute on chronic respiratory failure with hypoxemia [facility])     Digestive   Multiple duodenal ulcers   Last Assessment & Plan 07/11/2014 Office Visit Edited 07/11/2014  9:51 AM by Danie Binder, MD    UGIB due to INR > 6 and duodenal ulcers/H PYLORI  GASTRITIS. NO BRBPR OR MELENA. LAST Hb 9.11 Jun 2014.  TREAT H PYLORI ABP BID FOR 10 DAYS. MED SIDE EFFECTS DISCUSSED. NEEDS CLOSE MONITORING OF COUMADIN WHILE ON ABX. REDUCE COUMADIN. CHECK INR 3 DAYS AFTER STARTING ABX. DISCUSSED WITH PHARMACY. PROTONIX BID FOR 3 MOS THEN ONCE DAILY FOR 3 MOS THEN WILL CONSIDER STOPPING MEDS. FOLLOW UP IN 4 MOS.  READDRESS NEED FOR PROTONIX. NEEDS CBC AFTER NEXT VISIT.       GI bleed   Rectal bleeding     Genitourinary   Prostate cancer (HCC)   Acute renal failure (HCC)   CKD (chronic kidney disease) stage 3, GFR 30-59 ml/min     Other   High cholesterol   Backache, unspecified   Body mass index 40.0-44.9, adult [facility])   Colon cancer screening   Last Assessment & Plan 07/11/2014 Office Visit Written 07/11/2014  9:44 AM by Danie Binder, MD    PT REPORTS TCS IN PAST 10 YRS-AVERAGE RISK.  WILL OBTAIN REPORT.      Anemia   Chronic anticoagulation   Leukocytosis   Morbid obesity (HCC)   Elevated troponin   Anemia, unspecified   SOB (shortness of breath)       Imaging: Dg Chest 2 View  03/25/2015  CLINICAL DATA:  Shortness of breath and congestion this morning EXAM: CHEST  2 VIEW COMPARISON:  March 22, 2015 FINDINGS: The heart size and mediastinal contours are stable. There is no focal infiltrate, pulmonary edema, or pleural effusion. Degenerative joint changes of the spine are identified. IMPRESSION: No active cardiopulmonary disease. Electronically Signed   By: Abelardo Diesel M.D.   On: 03/25/2015 10:44   Dg Chest Portable 1 View  03/22/2015  CLINICAL  DATA:  Short of breath.  Tachycardia. EXAM: PORTABLE CHEST 1 VIEW COMPARISON:  03/02/2015. FINDINGS: Cardiac silhouette is normal in size. No mediastinal or hilar masses or convincing adenopathy. Study is degraded by motion. There is irregular bronchial wall thickening most evident in the right lung base. This may be chronic accentuated by motion. Acute bronchitis should be considered likely in the  proper clinical setting. There is no lung consolidation to suggest pneumonia and no evidence of pulmonary edema. No pleural effusion or pneumothorax. The bony thorax is grossly intact. IMPRESSION: 1. No evidence of pneumonia or pulmonary edema. 2. Bronchial wall thickening to the right lung base. This may reflect acute bronchitis. Electronically Signed   By: Lajean Manes M.D.   On: 03/22/2015 08:07      Name: Ryan Shea    DOB: February 17, 1946  Age: 70 y.o.  MR#: [id number]       PCP:  Bronson Curb, PA-C      Insurance: Payor: Mcarthur Rossetti MEDICARE / Plan: HUMANA MEDICARE CHOICE PPO / Product Type: *No Product type* /   CC:   No chief complaint on file.   VS Filed Vitals:   04/14/15 1536  BP: 118/50  Pulse: 110  Height: 5\' 6"  (1.676 m)  Weight: 240 lb (108.863 kg)  SpO2: 95%    Weights Current Weight  04/14/15 240 lb (108.863 kg)  03/27/15 231 lb 12.8 oz (105.144 kg)  03/02/15 236 lb 8 oz (107.276 kg)    Blood Pressure  BP Readings from Last 3 Encounters:  04/14/15 118/50  03/27/15 139/67  03/03/15 126/64     Admit date:  (Not on file) Last encounter with RMR:  11/21/2014   Allergy Review of patient's allergies indicates no known allergies.  Current Outpatient Prescriptions  Medication Sig Dispense Refill  . albuterol (PROVENTIL) (2.5 MG/3ML) 0.083% nebulizer solution Take 3 mLs (2.5 mg total) by nebulization every 4 (four) hours as needed for wheezing or shortness of breath. 75 mL 12  . allopurinol (ZYLOPRIM) 100 MG tablet Take 1 tablet by mouth daily.    Marland Kitchen aspirin 81 MG chewable tablet Chew 1 tablet (81 mg total) by mouth daily. 30 tablet 0  . carvedilol (COREG) 6.25 MG tablet Take 1 tablet (6.25 mg total) by mouth 2 (two) times daily with a meal. 180 tablet 3  . colchicine 0.6 MG tablet Take 1 tablet (0.6 mg total) by mouth daily. 20 tablet 0  . ferrous sulfate 325 (65 FE) MG tablet Take 325 mg by mouth at bedtime.     . Fluticasone-Salmeterol (ADVAIR DISKUS) 250-50  MCG/DOSE AEPB Inhale 1 puff into the lungs daily. 60 each 1  . furosemide (LASIX) 40 MG tablet Take 1 tablet (40 mg total) by mouth daily as needed for fluid or edema. 30 tablet 0  . gabapentin (NEURONTIN) 300 MG capsule Take 300 mg by mouth 3 (three) times daily as needed (for pain/neuropathy).     Marland Kitchen guaifenesin (ROBITUSSIN) 100 MG/5ML syrup Take 100 mg by mouth 3 (three) times daily as needed for congestion.    . pantoprazole (PROTONIX) 40 MG tablet Take 1 tablet (40 mg total) by mouth daily. 60 tablet 5  . pravastatin (PRAVACHOL) 40 MG tablet Take 1 tablet by mouth at bedtime.     Marland Kitchen tiotropium (SPIRIVA HANDIHALER) 18 MCG inhalation capsule Place 1 capsule (18 mcg total) into inhaler and inhale daily. 30 capsule 12  . VENTOLIN HFA 108 (90 BASE) MCG/ACT inhaler Inhale 1 puff into the lungs  every 6 (six) hours as needed for wheezing or shortness of breath.     . warfarin (COUMADIN) 1 MG tablet Take 3.5 tablets (3.5 mg total) by mouth daily at 6 PM. 60 tablet 0  . zolpidem (AMBIEN) 10 MG tablet Take 0.5 tablets (5 mg total) by mouth at bedtime as needed for sleep. 30 tablet 0  . lisinopril (PRINIVIL,ZESTRIL) 5 MG tablet Take 5 mg by mouth daily. Reported on 04/14/2015     No current facility-administered medications for this visit.    Discontinued Meds:    Medications Discontinued During This Encounter  Medication Reason  . cephALEXin (KEFLEX) 500 MG capsule Error  . predniSONE (DELTASONE) 10 MG tablet Error    Patient Active Problem List   Diagnosis Date Noted  . SOB (shortness of breath)   . Anemia, unspecified 03/25/2015  . Acute on chronic respiratory failure with hypoxia ([place]) 03/22/2015  . NSTEMI (non-ST elevated myocardial infarction) ([place]) 03/22/2015  . CAP (community acquired pneumonia) 03/22/2015  . Leukocytosis 03/22/2015  . CKD (chronic kidney disease) stage 3, GFR 30-59 ml/min 03/22/2015  . Morbid obesity ([place]) 03/22/2015  . Acute on chronic respiratory failure with  hypoxemia ([place]) 03/22/2015  . Elevated troponin   . COPD with acute exacerbation ([place]) 03/02/2015  . COPD exacerbation ([place]) 03/02/2015  . Acute renal failure ([place]) 08/24/2014  . Chronic anticoagulation 08/24/2014  . GI bleed 08/23/2014  . Anemia 08/23/2014  . Rectal bleeding 08/23/2014  . Colon cancer screening 07/11/2014  . Multiple duodenal ulcers 05/30/2014  . Aortic stenosis 04/23/2013  . Mitral regurgitation 04/23/2013  . Cardiomyopathy ([place]) 04/23/2013  . Paroxysmal atrial flutter ([place]) 04/23/2013  . HTN (hypertension) 04/20/2013  . High cholesterol 04/20/2013  . COPD (chronic obstructive pulmonary disease) ([place]) 04/20/2013  . Prostate cancer ([place]) 04/20/2013  . Backache, unspecified 04/20/2013  . Body mass index 40.0-44.9, adult (HCC) 04/20/2013    LABS    Component Value Date/Time   NA 137 03/27/2015 0604   NA 136 03/26/2015 0416   NA 135 03/25/2015 0550   K 5.0 03/27/2015 0604   K 4.7 03/26/2015 0416   K 4.7 03/25/2015 0550   CL 100* 03/27/2015 0604   CL 99* 03/26/2015 0416   CL 99* 03/25/2015 0550   CO2 32 03/27/2015 0604   CO2 31 03/26/2015 0416   CO2 30 03/25/2015 0550   GLUCOSE 91 03/27/2015 0604   GLUCOSE 102* 03/26/2015 0416   GLUCOSE 111* 03/25/2015 0550   BUN 28* 03/27/2015 0604   BUN 36* 03/26/2015 0416   BUN 36* 03/25/2015 0550   CREATININE 1.17 03/27/2015 0604   CREATININE 1.19 03/26/2015 0416   CREATININE 1.16 03/25/2015 0550   CALCIUM 8.4* 03/27/2015 0604   CALCIUM 8.6* 03/26/2015 0416   CALCIUM 8.6* 03/25/2015 0550   GFRNONAA >60 03/27/2015 0604   GFRNONAA >60 03/26/2015 0416   GFRNONAA >60 03/25/2015 0550   GFRAA >60 03/27/2015 0604   GFRAA >60 03/26/2015 0416   GFRAA >60 03/25/2015 0550   CMP     Component Value Date/Time   NA 137 03/27/2015 0604   K 5.0 03/27/2015 0604   CL 100* 03/27/2015 0604   CO2 32 03/27/2015 0604   GLUCOSE 91 03/27/2015 0604   BUN 28* 03/27/2015 0604   CREATININE 1.17 03/27/2015 0604   CALCIUM 8.4*  03/27/2015 0604   PROT 6.8 03/22/2015 0749   ALBUMIN 3.1* 03/22/2015 0749   AST 15 03/22/2015 0749   ALT 17 03/22/2015 0749   ALKPHOS 62  03/22/2015 0749   BILITOT 0.6 03/22/2015 0749   GFRNONAA >60 03/27/2015 0604   GFRAA >60 03/27/2015 0604       Component Value Date/Time   WBC 14.4* 03/27/2015 0604   WBC 17.2* 03/26/2015 0416   WBC 19.5* 03/25/2015 0550   HGB 9.7* 03/27/2015 0604   HGB 10.4* 03/26/2015 0416   HGB 10.0* 03/25/2015 0550   HCT 30.3* 03/27/2015 0604   HCT 32.4* 03/26/2015 0416   HCT 30.5* 03/25/2015 0550   MCV 89.4 03/27/2015 0604   MCV 91.3 03/26/2015 0416   MCV 90.2 03/25/2015 0550    Lipid Panel     Component Value Date/Time   CHOL 166 03/26/2015 0416   TRIG 71 03/26/2015 0416   HDL 46 03/26/2015 0416   CHOLHDL 3.6 03/26/2015 0416   VLDL 14 03/26/2015 0416   LDLCALC 106* 03/26/2015 0416    ABG    Component Value Date/Time   HCO3 31.9* 03/26/2015 1431   TCO2 34 03/26/2015 1431   O2SAT 67.0 03/26/2015 1431     No results found for: TSH BNP (last 3 results) No results for input(s): BNP in the last 8760 hours.  ProBNP (last 3 results) No results for input(s): PROBNP in the last 8760 hours.  Cardiac Panel (last 3 results) No results for input(s): CKTOTAL, CKMB, TROPONINI, RELINDX in the last 72 hours.  Iron/TIBC/Ferritin/ %Sat No results found for: IRON, TIBC, FERRITIN, IRONPCTSAT   EKG Orders placed or performed during the hospital encounter of 03/22/15  . EKG 12-Lead  . EKG 12-Lead  . EKG 12-Lead  . EKG 12-Lead  . EKG 12-Lead  . EKG 12-Lead  . EKG 12-Lead  . EKG 12-Lead  . EKG     Prior Assessment and Plan Problem List as of 04/14/2015      Cardiovascular and Mediastinum   HTN (hypertension)   Aortic stenosis   Mitral regurgitation   Cardiomyopathy (HCC)   Paroxysmal atrial flutter (HCC)   NSTEMI (non-ST elevated myocardial infarction) ([place])     Respiratory   COPD (chronic obstructive pulmonary disease) ([place])   COPD with  acute exacerbation (HCC)   COPD exacerbation (HCC)   Acute on chronic respiratory failure with hypoxia ([place])   CAP (community acquired pneumonia)   Acute on chronic respiratory failure with hypoxemia [facility])     Digestive   Multiple duodenal ulcers   Last Assessment & Plan 07/11/2014 Office Visit Edited 07/11/2014  9:51 AM by Danie Binder, MD    UGIB due to INR > 6 and duodenal ulcers/H PYLORI GASTRITIS. NO BRBPR OR MELENA. LAST Hb 9.11 Jun 2014.  TREAT H PYLORI ABP BID FOR 10 DAYS. MED SIDE EFFECTS DISCUSSED. NEEDS CLOSE MONITORING OF COUMADIN WHILE ON ABX. REDUCE COUMADIN. CHECK INR 3 DAYS AFTER STARTING ABX. DISCUSSED WITH PHARMACY. PROTONIX BID FOR 3 MOS THEN ONCE DAILY FOR 3 MOS THEN WILL CONSIDER STOPPING MEDS. FOLLOW UP IN 4 MOS.  READDRESS NEED FOR PROTONIX. NEEDS CBC AFTER NEXT VISIT.       GI bleed   Rectal bleeding     Genitourinary   Prostate cancer (HCC)   Acute renal failure (HCC)   CKD (chronic kidney disease) stage 3, GFR 30-59 ml/min     Other   High cholesterol   Backache, unspecified   Body mass index 40.0-44.9, adult [facility])   Colon cancer screening   Last Assessment & Plan 07/11/2014 Office Visit Written 07/11/2014  9:44 AM by Danie Binder, MD    PT  REPORTS TCS IN PAST 10 YRS-AVERAGE RISK.  WILL OBTAIN REPORT.      Anemia   Chronic anticoagulation   Leukocytosis   Morbid obesity (HCC)   Elevated troponin   Anemia, unspecified   SOB (shortness of breath)       Imaging: Dg Chest 2 View  03/25/2015  CLINICAL DATA:  Shortness of breath and congestion this morning EXAM: CHEST  2 VIEW COMPARISON:  March 22, 2015 FINDINGS: The heart size and mediastinal contours are stable. There is no focal infiltrate, pulmonary edema, or pleural effusion. Degenerative joint changes of the spine are identified. IMPRESSION: No active cardiopulmonary disease. Electronically Signed   By: Abelardo Diesel M.D.   On: 03/25/2015 10:44   Dg Chest Portable 1 View  03/22/2015  CLINICAL  DATA:  Short of breath.  Tachycardia. EXAM: PORTABLE CHEST 1 VIEW COMPARISON:  03/02/2015. FINDINGS: Cardiac silhouette is normal in size. No mediastinal or hilar masses or convincing adenopathy. Study is degraded by motion. There is irregular bronchial wall thickening most evident in the right lung base. This may be chronic accentuated by motion. Acute bronchitis should be considered likely in the proper clinical setting. There is no lung consolidation to suggest pneumonia and no evidence of pulmonary edema. No pleural effusion or pneumothorax. The bony thorax is grossly intact. IMPRESSION: 1. No evidence of pneumonia or pulmonary edema. 2. Bronchial wall thickening to the right lung base. This may reflect acute bronchitis. Electronically Signed   By: Lajean Manes M.D.   On: 03/22/2015 08:07

## 2015-04-14 NOTE — Patient Instructions (Signed)
Your physician recommends that you schedule a follow-up appointment in: 2 months   Your physician recommends that you continue on your current medications as directed. Please refer to the Current Medication list given to you today.   Get lab work at hospital today   Your physician has requested that you have a renal artery duplex. During this test, an ultrasound is used to evaluate blood flow to the kidneys. Allow one hour for this exam. Do not eat after midnight the day before and avoid carbonated beverages. Take your medications as you usually do.    You have been referred to CVTS, cardiothoracic surgeons to discuss aortic valve replacement        Thank you for choosing [facility] !

## 2015-04-14 NOTE — Progress Notes (Deleted)
Cardiology Office Note   Date:  04/14/2015   ID:  Therin, Pelc 1945-09-24, MRN [id number]  PCP:  Bronson Curb, PA-C  Cardiologist: Woodroe Chen, NP   Chief Complaint  Patient presents with  . Shortness of Breath      History of Present Illness: Ryan Shea is a 70 y.o. male who presents for post hospitalization evaluation after admission for acute on chronic respiratory failure, COPD, community-acquired pneumonia,with history of hypertension, and aortic valve stenosis.  During hospitalization, the patient underwent right and left cardiac catheterization date 03/26/2015.  This was completed in the setting of non-ST elevation MI, thought to be related to demand ischemia in the setting of pneumonia, and respiratory distress.  Cardiac catheterization revealed no angiographic evidence of CAD, he had severe aortic valve stenosis (mean gradient 33 mmHg, peak gradient 39 mmHg, AVA 0.95 cm square).  It is recommended.  The patient be referred to CVTS for evaluation of aortic valve repair or replacement.  In the interim, the patient was to recover from his pneumonia.  He comes today fully recovered from pneumonia, he is finished his antibiotic therapy, is breathing better.  He is followed for Coumadin therapy in the setting of paroxysmal atrial fibrillation her primary care physician, Dr. Brigitte Pulse.  He is anxious to move forward with aortic valve repair.  He states that he spoke with the cardiologist during hospitalization, who felt that TAVR  would not be an option for him and should be seen by CVTS  Aortic valve replacement. He also comes with a request to have a renal ultrasound, secondary to history of prostate cancer with abnormal renal ultrasound in the past.  Past Medical History  Diagnosis Date  . Asthma   . Hypertension   . COPD (chronic obstructive pulmonary disease) ([place])   . Cancer [facility])     Prostate   . Helicobacter pylori gastritis MAR 2016  . Multiple  duodenal ulcers MAR 2016  . Atrial flutter ([place])   . Aortic valve stenosis   . Cardiomyopathy ([place])   . Mitral regurgitation   . Gout     Past Surgical History  Procedure Laterality Date  . Prostate surgery    . Hernia repair    . Esophagogastroduodenoscopy N/A 05/31/2014    H PYLORI GASTRITIS, DUODENAL ULCERS  . Colonoscopy    . Colonoscopy N/A 08/25/2014    Procedure: COLONOSCOPY;  Surgeon: Rogene Houston, MD;  [facility];  Service: Endoscopy;  Laterality: N/A;  . Cardiac catheterization N/A 03/26/2015    Procedure: Right/Left Heart Cath and Coronary Angiography;  Surgeon: Burnell Blanks, MD;  Location: [place] CV LAB;  Service: Cardiovascular;  Laterality: N/A;     Current Outpatient Prescriptions  Medication Sig Dispense Refill  . albuterol (PROVENTIL) (2.5 MG/3ML) 0.083% nebulizer solution Take 3 mLs (2.5 mg total) by nebulization every 4 (four) hours as needed for wheezing or shortness of breath. 75 mL 12  . allopurinol (ZYLOPRIM) 100 MG tablet Take 1 tablet by mouth daily.    Marland Kitchen aspirin 81 MG chewable tablet Chew 1 tablet (81 mg total) by mouth daily. 30 tablet 0  . carvedilol (COREG) 6.25 MG tablet Take 1 tablet (6.25 mg total) by mouth 2 (two) times daily with a meal. 180 tablet 3  . colchicine 0.6 MG tablet Take 1 tablet (0.6 mg total) by mouth daily. 20 tablet 0  . ferrous sulfate 325 (65 FE) MG tablet Take 325 mg by mouth  at bedtime.     . Fluticasone-Salmeterol (ADVAIR DISKUS) 250-50 MCG/DOSE AEPB Inhale 1 puff into the lungs daily. 60 each 1  . furosemide (LASIX) 40 MG tablet Take 1 tablet (40 mg total) by mouth daily as needed for fluid or edema. 30 tablet 0  . gabapentin (NEURONTIN) 300 MG capsule Take 300 mg by mouth 3 (three) times daily as needed (for pain/neuropathy).     Marland Kitchen guaifenesin (ROBITUSSIN) 100 MG/5ML syrup Take 100 mg by mouth 3 (three) times daily as needed for congestion.    . pantoprazole (PROTONIX) 40 MG tablet Take 1 tablet (40  mg total) by mouth daily. 60 tablet 5  . pravastatin (PRAVACHOL) 40 MG tablet Take 1 tablet by mouth at bedtime.     Marland Kitchen tiotropium (SPIRIVA HANDIHALER) 18 MCG inhalation capsule Place 1 capsule (18 mcg total) into inhaler and inhale daily. 30 capsule 12  . VENTOLIN HFA 108 (90 BASE) MCG/ACT inhaler Inhale 1 puff into the lungs every 6 (six) hours as needed for wheezing or shortness of breath.     . warfarin (COUMADIN) 1 MG tablet Take 3.5 tablets (3.5 mg total) by mouth daily at 6 PM. 60 tablet 0  . zolpidem (AMBIEN) 10 MG tablet Take 0.5 tablets (5 mg total) by mouth at bedtime as needed for sleep. 30 tablet 0  . lisinopril (PRINIVIL,ZESTRIL) 5 MG tablet Take 5 mg by mouth daily. Reported on 04/14/2015     No current facility-administered medications for this visit.    Allergies:   Review of patient's allergies indicates no known allergies.    Social History:  The patient  reports that he quit smoking about 36 years ago. His smoking use included Cigarettes. He started smoking about 55 years ago. He has a 40 pack-year smoking history. He has quit using smokeless tobacco. He reports that he does not drink alcohol or use illicit drugs.   Family History:  The patient's family history includes Cancer in his brother and other; Stroke in his mother. There is no history of Colon cancer or Colon polyps.    ROS: All other systems are reviewed and negative. Unless otherwise mentioned in H&P    PHYSICAL EXAM: VS:  BP 118/50 mmHg  Pulse 110  Ht 5\' 6"  (1.676 m)  Wt 240 lb (108.863 kg)  BMI 38.76 kg/m2  SpO2 95% , BMI Body mass index is 38.76 kg/(m^2). GEN: Well nourished, well developed, in no acute distressmorbidly obese. HEENT: normal Neck: no JVD, carotid bruits, or masses Cardiac: RRR; no murmurs, rubs, or gallops,no edema  Respiratory:  Mild crackles in the lower left lobe, without wheezes, or rhonchi MS: no deformity or atrophy Skin: warm and dry, no rash Neuro:  Strength and sensation are  intact Psych: euthymic mood, full affect   Recent Labs: 03/22/2015: ALT 17 03/27/2015: BUN 28*; Creatinine, Ser 1.17; Hemoglobin 9.7*; Platelets 316; Potassium 5.0; Sodium 137    Lipid Panel    Component Value Date/Time   CHOL 166 03/26/2015 0416   TRIG 71 03/26/2015 0416   HDL 46 03/26/2015 0416   CHOLHDL 3.6 03/26/2015 0416   VLDL 14 03/26/2015 0416   LDLCALC 106* 03/26/2015 0416      Wt Readings from Last 3 Encounters:  04/14/15 240 lb (108.863 kg)  03/27/15 231 lb 12.8 oz (105.144 kg)  03/02/15 236 lb 8 oz (107.276 kg)     ASSESSMENT AND PLAN:  1. Severe aortic valve stenosis.: echocardiogram revealed an EF of 55-6%, aortic valve did not  show significant regurgitation.  The mean gradient was 36 mmHg (S.) peak radiant (ES): 69 mmHg. He will be referred to cardiothoracic surgery for their evaluation, and recommendations on timing and procedure for aortic valve replacement or repair.  In the interim, his heart rate is not well-controlled running around 110-115 beats per minute.  I consider going up on carvedilol to 9.75 mg twice a day, but wanted to check some lab work to evaluate for dehydration and anemia before going up on the dose.  Will check a BMET.  2. Paroxysmal atrial fibrillation:he continues on Coumadin therapy, and had it checked last Friday, and was told that he was doing well with no changes in his dosing.  He appears slightly pale .  Today.  With history of GI bleed and anemia on Coumadin.  I am going to go ahead and do a CBC to evaluate further. I will include a PT/INR also.  3. History of prostate cancer:he was told on discharge, that he would need to have a follow-up renal ultrasound. I have gone ahead and ordered this with results to go to PCP. If any abnormalities, are seen in lab work or testing.  These will be called to the patient and to his PCP for further evaluation and treatment.  Prior to surgery.     Current medicines are reviewed at length with the  patient today.    Labs/ tests ordered today include: ***  Orders Placed This Encounter  Procedures  . US Renal  . CBC  . Basic Metabolic Panel (BMET)  . INR/PT  . Ambulatory referral to Cardiothoracic Surgery     Disposition:   FU with *** in {gen number [id number] {TIME; UNITS DAY/WEEK/MONTH:19136}   Signed, Jory Sims, NP  04/14/2015 5:58 PM    [place] [address], [place], [place] [address] Phone: [phone]; Fax: [phone]

## 2015-04-15 LAB — PROTIME-INR

## 2015-04-15 NOTE — Addendum Note (Signed)
Addended by: Barbarann Ehlers A on: 04/15/2015 09:04 AM   Modules accepted: Orders

## 2015-04-16 ENCOUNTER — Ambulatory Visit (HOSPITAL_COMMUNITY)
Admission: RE | Admit: 2015-04-16 | Discharge: 2015-04-16 | Disposition: A | Discharge status: Home / Self Care | Payer: Medicare PPO | Source: Ambulatory Visit | Attending: Family Medicine | Admitting: Family Medicine

## 2015-04-16 ENCOUNTER — Other Ambulatory Visit (HOSPITAL_COMMUNITY)
Admission: RE | Admit: 2015-04-16 | Discharge: 2015-04-16 | Disposition: A | Discharge status: Home / Self Care | Payer: Medicare PPO | Source: Ambulatory Visit | Attending: Adult Health | Admitting: Adult Health

## 2015-04-16 DIAGNOSIS — Z8546 Personal history of malignant neoplasm of prostate: Secondary | ICD-10-CM

## 2015-04-16 DIAGNOSIS — E7800 Pure hypercholesterolemia, unspecified: Secondary | ICD-10-CM | POA: Diagnosis present

## 2015-04-16 DIAGNOSIS — N183 Chronic kidney disease, stage 3 unspecified: Secondary | ICD-10-CM

## 2015-04-16 DIAGNOSIS — E785 Hyperlipidemia, unspecified: Secondary | ICD-10-CM | POA: Diagnosis present

## 2015-04-16 DIAGNOSIS — Z823 Family history of stroke: Secondary | ICD-10-CM

## 2015-04-16 DIAGNOSIS — Z7901 Long term (current) use of anticoagulants: Secondary | ICD-10-CM

## 2015-04-16 DIAGNOSIS — E669 Obesity, unspecified: Secondary | ICD-10-CM | POA: Diagnosis present

## 2015-04-16 DIAGNOSIS — K633 Ulcer of intestine: Secondary | ICD-10-CM | POA: Diagnosis present

## 2015-04-16 DIAGNOSIS — Z87891 Personal history of nicotine dependence: Secondary | ICD-10-CM

## 2015-04-16 DIAGNOSIS — J45909 Unspecified asthma, uncomplicated: Secondary | ICD-10-CM | POA: Diagnosis present

## 2015-04-16 DIAGNOSIS — Z6838 Body mass index (BMI) 38.0-38.9, adult: Secondary | ICD-10-CM

## 2015-04-16 DIAGNOSIS — I429 Cardiomyopathy, unspecified: Secondary | ICD-10-CM | POA: Diagnosis present

## 2015-04-16 DIAGNOSIS — D649 Anemia, unspecified: Secondary | ICD-10-CM | POA: Diagnosis not present

## 2015-04-16 DIAGNOSIS — T39015A Adverse effect of aspirin, initial encounter: Secondary | ICD-10-CM | POA: Diagnosis present

## 2015-04-16 DIAGNOSIS — J449 Chronic obstructive pulmonary disease, unspecified: Secondary | ICD-10-CM | POA: Diagnosis present

## 2015-04-16 DIAGNOSIS — Z7982 Long term (current) use of aspirin: Secondary | ICD-10-CM

## 2015-04-16 DIAGNOSIS — E790 Hyperuricemia without signs of inflammatory arthritis and tophaceous disease: Secondary | ICD-10-CM

## 2015-04-16 DIAGNOSIS — I10 Essential (primary) hypertension: Secondary | ICD-10-CM | POA: Diagnosis present

## 2015-04-16 DIAGNOSIS — I4892 Unspecified atrial flutter: Secondary | ICD-10-CM | POA: Diagnosis present

## 2015-04-16 DIAGNOSIS — K921 Melena: Secondary | ICD-10-CM | POA: Diagnosis present

## 2015-04-16 DIAGNOSIS — I350 Nonrheumatic aortic (valve) stenosis: Secondary | ICD-10-CM | POA: Diagnosis present

## 2015-04-16 LAB — CBC
HCT: 20.3 % — ABNORMAL LOW (ref 39.0–52.0)
Hemoglobin: 6.3 g/dL — CL (ref 13.0–17.0)
MCH: 30.3 pg (ref 26.0–34.0)
MCHC: 31 g/dL (ref 30.0–36.0)
MCV: 97.6 fL (ref 78.0–100.0)
Platelets: 329 10*3/uL (ref 150–400)
RBC: 2.08 MIL/uL — ABNORMAL LOW (ref 4.22–5.81)
RDW: 18.9 % — AB (ref 11.5–15.5)
WBC: 11.2 10*3/uL — ABNORMAL HIGH (ref 4.0–10.5)

## 2015-04-16 LAB — BASIC METABOLIC PANEL
Anion gap: 9 (ref 5–15)
BUN: 21 mg/dL — ABNORMAL HIGH (ref 6–20)
CHLORIDE: 100 mmol/L — AB (ref 101–111)
CO2: 31 mmol/L (ref 22–32)
CREATININE: 1.35 mg/dL — AB (ref 0.61–1.24)
Calcium: 8.6 mg/dL — ABNORMAL LOW (ref 8.9–10.3)
GFR calc non Af Amer: 52 mL/min — ABNORMAL LOW (ref >60)
GFR, EST AFRICAN AMERICAN: 60 mL/min — AB (ref >60)
Glucose, Bld: 121 mg/dL — ABNORMAL HIGH (ref 65–99)
POTASSIUM: 3.8 mmol/L (ref 3.5–5.1)
SODIUM: 140 mmol/L (ref 135–145)

## 2015-04-16 LAB — PROTIME-INR
INR: 2.27 — ABNORMAL HIGH (ref 0.00–1.49)
Prothrombin Time: 24.9 seconds — ABNORMAL HIGH (ref 11.6–15.2)

## 2015-04-17 ENCOUNTER — Inpatient Hospital Stay (HOSPITAL_COMMUNITY): Payer: Medicare PPO

## 2015-04-17 ENCOUNTER — Encounter: Payer: Medicare PPO | Admitting: Surgery

## 2015-04-17 ENCOUNTER — Telehealth: Payer: Self-pay

## 2015-04-17 ENCOUNTER — Encounter (HOSPITAL_COMMUNITY): Payer: Self-pay

## 2015-04-17 ENCOUNTER — Inpatient Hospital Stay (HOSPITAL_COMMUNITY)
Admission: EM | Admit: 2015-04-17 | Discharge: 2015-04-19 | DRG: 812 | Disposition: A | Discharge status: Home / Self Care | Payer: Medicare PPO | Attending: Internal Medicine | Admitting: Internal Medicine

## 2015-04-17 DIAGNOSIS — Z7901 Long term (current) use of anticoagulants: Secondary | ICD-10-CM

## 2015-04-17 DIAGNOSIS — E7800 Pure hypercholesterolemia, unspecified: Secondary | ICD-10-CM | POA: Diagnosis present

## 2015-04-17 DIAGNOSIS — K922 Gastrointestinal hemorrhage, unspecified: Secondary | ICD-10-CM | POA: Diagnosis not present

## 2015-04-17 DIAGNOSIS — T39015A Adverse effect of aspirin, initial encounter: Secondary | ICD-10-CM | POA: Diagnosis present

## 2015-04-17 DIAGNOSIS — K921 Melena: Secondary | ICD-10-CM | POA: Diagnosis present

## 2015-04-17 DIAGNOSIS — I10 Essential (primary) hypertension: Secondary | ICD-10-CM | POA: Diagnosis present

## 2015-04-17 DIAGNOSIS — J449 Chronic obstructive pulmonary disease, unspecified: Secondary | ICD-10-CM | POA: Diagnosis present

## 2015-04-17 DIAGNOSIS — E669 Obesity, unspecified: Secondary | ICD-10-CM | POA: Diagnosis present

## 2015-04-17 DIAGNOSIS — D649 Anemia, unspecified: Secondary | ICD-10-CM | POA: Diagnosis present

## 2015-04-17 DIAGNOSIS — Z823 Family history of stroke: Secondary | ICD-10-CM | POA: Diagnosis not present

## 2015-04-17 DIAGNOSIS — I4892 Unspecified atrial flutter: Secondary | ICD-10-CM | POA: Diagnosis present

## 2015-04-17 DIAGNOSIS — R0602 Shortness of breath: Secondary | ICD-10-CM | POA: Diagnosis present

## 2015-04-17 DIAGNOSIS — Z8546 Personal history of malignant neoplasm of prostate: Secondary | ICD-10-CM | POA: Diagnosis not present

## 2015-04-17 DIAGNOSIS — I350 Nonrheumatic aortic (valve) stenosis: Secondary | ICD-10-CM | POA: Diagnosis present

## 2015-04-17 DIAGNOSIS — Z7982 Long term (current) use of aspirin: Secondary | ICD-10-CM | POA: Diagnosis not present

## 2015-04-17 DIAGNOSIS — K633 Ulcer of intestine: Secondary | ICD-10-CM | POA: Diagnosis present

## 2015-04-17 DIAGNOSIS — J45909 Unspecified asthma, uncomplicated: Secondary | ICD-10-CM | POA: Diagnosis present

## 2015-04-17 DIAGNOSIS — I429 Cardiomyopathy, unspecified: Secondary | ICD-10-CM | POA: Diagnosis present

## 2015-04-17 DIAGNOSIS — Z87891 Personal history of nicotine dependence: Secondary | ICD-10-CM | POA: Diagnosis not present

## 2015-04-17 DIAGNOSIS — E785 Hyperlipidemia, unspecified: Secondary | ICD-10-CM | POA: Diagnosis present

## 2015-04-17 DIAGNOSIS — Z6838 Body mass index (BMI) 38.0-38.9, adult: Secondary | ICD-10-CM | POA: Diagnosis not present

## 2015-04-17 LAB — CBC WITH DIFFERENTIAL/PLATELET
BASOS ABS: 0 10*3/uL (ref 0.0–0.1)
BASOS PCT: 0 %
Eosinophils Absolute: 0.1 10*3/uL (ref 0.0–0.7)
Eosinophils Relative: 2 %
HEMATOCRIT: 20.4 % — AB (ref 39.0–52.0)
HEMOGLOBIN: 6.2 g/dL — AB (ref 13.0–17.0)
Lymphocytes Relative: 12 %
Lymphs Abs: 1 10*3/uL (ref 0.7–4.0)
MCH: 30.1 pg (ref 26.0–34.0)
MCHC: 30.4 g/dL (ref 30.0–36.0)
MCV: 99 fL (ref 78.0–100.0)
MONO ABS: 0.7 10*3/uL (ref 0.1–1.0)
Monocytes Relative: 8 %
NEUTROS ABS: 6.7 10*3/uL (ref 1.7–7.7)
NEUTROS PCT: 79 %
Platelets: 350 10*3/uL (ref 150–400)
RBC: 2.06 MIL/uL — ABNORMAL LOW (ref 4.22–5.81)
RDW: 18.6 % — AB (ref 11.5–15.5)
WBC: 8.6 10*3/uL (ref 4.0–10.5)

## 2015-04-17 LAB — COMPREHENSIVE METABOLIC PANEL
ALBUMIN: 2.8 g/dL — AB (ref 3.5–5.0)
ALT: 15 U/L — ABNORMAL LOW (ref 17–63)
AST: 19 U/L (ref 15–41)
Alkaline Phosphatase: 73 U/L (ref 38–126)
Anion gap: 6 (ref 5–15)
BILIRUBIN TOTAL: 0.5 mg/dL (ref 0.3–1.2)
BUN: 18 mg/dL (ref 6–20)
CO2: 33 mmol/L — AB (ref 22–32)
Calcium: 8.4 mg/dL — ABNORMAL LOW (ref 8.9–10.3)
Chloride: 102 mmol/L (ref 101–111)
Creatinine, Ser: 1.13 mg/dL (ref 0.61–1.24)
GFR calc Af Amer: >60 mL/min (ref >60)
GFR calc non Af Amer: >60 mL/min (ref >60)
GLUCOSE: 123 mg/dL — AB (ref 65–99)
POTASSIUM: 3.6 mmol/L (ref 3.5–5.1)
Sodium: 141 mmol/L (ref 135–145)
TOTAL PROTEIN: 6.4 g/dL — AB (ref 6.5–8.1)

## 2015-04-17 LAB — PREPARE RBC (CROSSMATCH)

## 2015-04-17 LAB — PROTIME-INR
INR: 2.01 — AB (ref 0.00–1.49)
Prothrombin Time: 22.6 seconds — ABNORMAL HIGH (ref 11.6–15.2)

## 2015-04-17 LAB — RETICULOCYTES
RBC.: 2.06 MIL/uL — AB (ref 4.22–5.81)
RETIC COUNT ABSOLUTE: 274 10*3/uL — AB (ref 19.0–186.0)
RETIC CT PCT: 13.3 % — AB (ref 0.4–3.1)

## 2015-04-17 LAB — TSH: TSH: 0.521 u[IU]/mL (ref 0.350–4.500)

## 2015-04-17 MED ORDER — GABAPENTIN 300 MG PO CAPS
300.0000 mg | ORAL_CAPSULE | Freq: Three times a day (TID) | ORAL | Status: DC | PRN
  Administered 2015-04-17: 300 mg via ORAL
  Filled 2015-04-17: qty 1

## 2015-04-17 MED ORDER — COLCHICINE 0.6 MG PO TABS
0.6000 mg | ORAL_TABLET | Freq: Every day | ORAL | Status: DC
  Administered 2015-04-18 – 2015-04-19 (×2): 0.6 mg via ORAL
  Filled 2015-04-17 (×2): qty 1

## 2015-04-17 MED ORDER — ACETAMINOPHEN 325 MG PO TABS: 650.0000 mg | ORAL_TABLET | Freq: Four times a day (QID) | ORAL | Status: DC | PRN

## 2015-04-17 MED ORDER — SODIUM CHLORIDE 0.9 % IV SOLN
Freq: Once | INTRAVENOUS | Status: AC
  Administered 2015-04-17: 13:00:00 via INTRAVENOUS

## 2015-04-17 MED ORDER — MOMETASONE FURO-FORMOTEROL FUM 100-5 MCG/ACT IN AERO
2.0000 | INHALATION_SPRAY | Freq: Two times a day (BID) | RESPIRATORY_TRACT | Status: DC
  Administered 2015-04-18 – 2015-04-19 (×3): 2 via RESPIRATORY_TRACT
  Filled 2015-04-17: qty 8.8

## 2015-04-17 MED ORDER — ALBUTEROL SULFATE (2.5 MG/3ML) 0.083% IN NEBU: 2.5000 mg | INHALATION_SOLUTION | RESPIRATORY_TRACT | Status: DC | PRN

## 2015-04-17 MED ORDER — SODIUM CHLORIDE 0.9% FLUSH
3.0000 mL | Freq: Two times a day (BID) | INTRAVENOUS | Status: DC
  Administered 2015-04-17 – 2015-04-19 (×4): 3 mL via INTRAVENOUS

## 2015-04-17 MED ORDER — PANTOPRAZOLE SODIUM 40 MG PO TBEC
40.0000 mg | DELAYED_RELEASE_TABLET | Freq: Two times a day (BID) | ORAL | Status: DC
  Administered 2015-04-18 – 2015-04-19 (×3): 40 mg via ORAL
  Filled 2015-04-17 (×3): qty 1

## 2015-04-17 MED ORDER — SODIUM CHLORIDE 0.9 % IV SOLN: 250.0000 mL | INTRAVENOUS | Status: DC | PRN

## 2015-04-17 MED ORDER — ACETAMINOPHEN 650 MG RE SUPP: 650.0000 mg | Freq: Four times a day (QID) | RECTAL | Status: DC | PRN

## 2015-04-17 MED ORDER — PRAVASTATIN SODIUM 40 MG PO TABS
40.0000 mg | ORAL_TABLET | Freq: Every day | ORAL | Status: DC
  Administered 2015-04-17 – 2015-04-18 (×2): 40 mg via ORAL
  Filled 2015-04-17 (×2): qty 1

## 2015-04-17 MED ORDER — ALLOPURINOL 100 MG PO TABS
100.0000 mg | ORAL_TABLET | Freq: Every day | ORAL | Status: DC
  Administered 2015-04-17 – 2015-04-19 (×3): 100 mg via ORAL
  Filled 2015-04-17 (×3): qty 1

## 2015-04-17 MED ORDER — TIOTROPIUM BROMIDE MONOHYDRATE 18 MCG IN CAPS
18.0000 ug | ORAL_CAPSULE | Freq: Every day | RESPIRATORY_TRACT | Status: DC
  Administered 2015-04-18 – 2015-04-19 (×2): 18 ug via RESPIRATORY_TRACT
  Filled 2015-04-17: qty 5

## 2015-04-17 MED ORDER — SODIUM CHLORIDE 0.9% FLUSH: 3.0000 mL | INTRAVENOUS | Status: DC | PRN

## 2015-04-17 MED ORDER — ONDANSETRON HCL 4 MG PO TABS: 4.0000 mg | ORAL_TABLET | Freq: Four times a day (QID) | ORAL | Status: DC | PRN

## 2015-04-17 MED ORDER — CARVEDILOL 3.125 MG PO TABS
6.2500 mg | ORAL_TABLET | Freq: Two times a day (BID) | ORAL | Status: DC
  Administered 2015-04-18 – 2015-04-19 (×3): 6.25 mg via ORAL
  Filled 2015-04-17 (×3): qty 2

## 2015-04-17 MED ORDER — ONDANSETRON HCL 4 MG/2ML IJ SOLN: 4.0000 mg | Freq: Four times a day (QID) | INTRAMUSCULAR | Status: DC | PRN

## 2015-04-17 MED ORDER — ZOLPIDEM TARTRATE 5 MG PO TABS
5.0000 mg | ORAL_TABLET | Freq: Every evening | ORAL | Status: DC | PRN
  Administered 2015-04-17: 5 mg via ORAL
  Filled 2015-04-17: qty 1

## 2015-04-17 NOTE — Telephone Encounter (Signed)
I spoke with  Nurse Manuela Schwartz at [facility] and informed her of patients critical labs in case he show up there today for apt. I asked her to direct him to [facility] ED.She will call me back if he shows

## 2015-04-17 NOTE — Telephone Encounter (Signed)
Mrs. Essary was given information about her husbands low HGB 6.3. She was instructed to take him to [facility] ED and to call his cardiologist ASAP. [facility]CTS will reschedule his appointment with Dr Cyndia Bent for a later date and notify him of date and time.

## 2015-04-17 NOTE — ED Notes (Signed)
Pt reports had blood work yesterday at cardiologist's office and they told him to come to er because hemoglobin was 6. Something.  Pt reports generalized weakness since last night.  Denies black or bloody stools.  LBM was 3 days ago.  Pt says he has history of bleeding ulcers.

## 2015-04-17 NOTE — Telephone Encounter (Signed)
Wife called back and she states she will take spouse to ED now,I cancelled their apt with Dr Cyndia Bent today

## 2015-04-17 NOTE — H&P (Signed)
[facility] History and Physical  Ryan Shea [id number] DOB: Mar 13, 1945 DOA: 04/17/2015  Referring physician: Dr. Dayna Barker PCP: Shade Flood, MD   Chief Complaint: shortness of breath  HPI: Ryan Shea is a 70 y.o. male with a history of aortic valve disease, paroxysmal atrial flutter, COPD, on chronic anticoagulation who presents to the hospital with complaints of progressive shortness of breath. He reports progressive shortness of breath for several months now, more so over the past few weeks. He denies any chest pain. Denies any orthopnea. He has had some lightheadedness over the past few weeks. He's not had any melena, hematochezia. No fever, cough. He was evaluated in the emergency room where CBC showed hemoglobin of 6.3. He was found to have heme positive stools without any gross evidence of bleeding. He'll be admitted for further treatments.   Review of Systems:  Pertinent positives as per HPI, otherwise negative  Past Medical History  Diagnosis Date  . Asthma   . Hypertension   . COPD (chronic obstructive pulmonary disease) ([place])   . Cancer [facility])     Prostate   . Helicobacter pylori gastritis MAR 2016  . Multiple duodenal ulcers MAR 2016  . Atrial flutter ([place])   . Aortic valve stenosis   . Cardiomyopathy ([place])   . Mitral regurgitation   . Gout    Past Surgical History  Procedure Laterality Date  . Prostate surgery    . Hernia repair    . Esophagogastroduodenoscopy N/A 05/31/2014    H PYLORI GASTRITIS, DUODENAL ULCERS  . Colonoscopy    . Colonoscopy N/A 08/25/2014    Procedure: COLONOSCOPY;  Surgeon: Rogene Houston, MD;  [facility];  Service: Endoscopy;  Laterality: N/A;  . Cardiac catheterization N/A 03/26/2015    Procedure: Right/Left Heart Cath and Coronary Angiography;  Surgeon: Burnell Blanks, MD;  Location: [place] CV LAB;  Service: Cardiovascular;  Laterality: N/A;   Social History:  reports that he quit smoking  about 36 years ago. His smoking use included Cigarettes. He started smoking about 55 years ago. He has a 40 pack-year smoking history. He has quit using smokeless tobacco. He reports that he does not drink alcohol or use illicit drugs.  No Known Allergies  Family History  Problem Relation Age of Onset  . Stroke Mother   . Cancer Brother   . Cancer Other   . Colon cancer Neg Hx   . Colon polyps Neg Hx     Prior to Admission medications   Medication Sig Start Date End Date Taking? Authorizing Provider  albuterol (PROVENTIL) (2.5 MG/3ML) 0.083% nebulizer solution Take 3 mLs (2.5 mg total) by nebulization every 4 (four) hours as needed for wheezing or shortness of breath. 03/03/15  Yes Kathie Dike, MD  allopurinol (ZYLOPRIM) 100 MG tablet Take 1 tablet by mouth daily. 03/06/15  Yes Historical Provider, MD  aspirin 81 MG chewable tablet Chew 1 tablet (81 mg total) by mouth daily. 03/27/15  Yes Florencia Reasons, MD  carvedilol (COREG) 6.25 MG tablet Take 1 tablet (6.25 mg total) by mouth 2 (two) times daily with a meal. 05/23/13  Yes [name], MD  colchicine 0.6 MG tablet Take 1 tablet (0.6 mg total) by mouth daily. 03/03/15  Yes Kathie Dike, MD  ferrous sulfate 325 (65 FE) MG tablet Take 325 mg by mouth at bedtime.    Yes Historical Provider, MD  Fluticasone-Salmeterol (ADVAIR DISKUS) 250-50 MCG/DOSE AEPB Inhale 1 puff into the lungs daily.  08/25/14  Yes Kathie Dike, MD  furosemide (LASIX) 40 MG tablet Take 1 tablet (40 mg total) by mouth daily as needed for fluid or edema. 03/27/15  Yes Florencia Reasons, MD  gabapentin (NEURONTIN) 300 MG capsule Take 300 mg by mouth 3 (three) times daily as needed (for pain/neuropathy).  02/23/14  Yes Historical Provider, MD  pantoprazole (PROTONIX) 40 MG tablet Take 1 tablet (40 mg total) by mouth daily. 08/25/14  Yes Kathie Dike, MD  pravastatin (PRAVACHOL) 40 MG tablet Take 1 tablet by mouth at bedtime.  07/06/13  Yes Historical Provider, MD  tiotropium  (SPIRIVA HANDIHALER) 18 MCG inhalation capsule Place 1 capsule (18 mcg total) into inhaler and inhale daily. 03/03/15  Yes Kathie Dike, MD  VENTOLIN HFA 108 (90 BASE) MCG/ACT inhaler Inhale 1 puff into the lungs every 6 (six) hours as needed for wheezing or shortness of breath.  05/10/14  Yes Historical Provider, MD  warfarin (COUMADIN) 1 MG tablet Take 3.5 tablets (3.5 mg total) by mouth daily at 6 PM. Patient taking differently: Take 2.5 mg by mouth daily at 6 PM. HOLD CRITICAL LOW HGB 04/17/15 03/27/15  Yes Florencia Reasons, MD  zolpidem (AMBIEN) 10 MG tablet Take 0.5 tablets (5 mg total) by mouth at bedtime as needed for sleep. 03/27/15  Yes Florencia Reasons, MD   Physical Exam: Filed Vitals:   04/17/15 1507 04/17/15 1636 04/17/15 1714 04/17/15 1746  BP: 122/62 122/63 123/63 123/63  Pulse: 90 94 95 97  Temp: 98.1 F (36.7 C) 97.9 F (36.6 C) 98.6 F (37 C) 98.6 F (37 C)  TempSrc: Oral Oral Oral Oral  Resp: 18 20 20 18   Height:      Weight:      SpO2: 98% 97% 94% 96%    Wt Readings from Last 3 Encounters:  04/17/15 108.863 kg (240 lb)  04/14/15 108.863 kg (240 lb)  03/27/15 105.144 kg (231 lb 12.8 oz)    General:  Appears calm and comfortable Eyes: PERRL, normal lids, irises & conjunctiva ENT: grossly normal hearing, lips & tongue Neck: no LAD, masses or thyromegaly Cardiovascular: RRR, no m/r/g. No LE edema. Telemetry: SR, no arrhythmias  Respiratory: CTA bilaterally, no w/r/r. Normal respiratory effort. Abdomen: soft, ntnd Skin: no rash or induration seen on limited exam Musculoskeletal: grossly normal tone BUE/BLE Psychiatric: grossly normal mood and affect, speech fluent and appropriate Neurologic: grossly non-focal.          Labs on Admission:  Basic Metabolic Panel:  Recent Labs Lab 04/16/15 1418 04/17/15 1112  NA 140 141  K 3.8 3.6  CL 100* 102  CO2 31 33*  GLUCOSE 121* 123*  BUN 21* 18  CREATININE 1.35* 1.13  CALCIUM 8.6* 8.4*   Liver Function Tests:  Recent  Labs Lab 04/17/15 1112  AST 19  ALT 15*  ALKPHOS 73  BILITOT 0.5  PROT 6.4*  ALBUMIN 2.8*   No results for input(s): LIPASE, AMYLASE in the last 168 hours. No results for input(s): AMMONIA in the last 168 hours. CBC:  Recent Labs Lab 04/16/15 1418 04/17/15 1112  WBC 11.2* 8.6  NEUTROABS  --  6.7  HGB 6.3* 6.2*  HCT 20.3* 20.4*  MCV 97.6 99.0  PLT 329 350   Cardiac Enzymes: No results for input(s): CKTOTAL, CKMB, CKMBINDEX, TROPONINI in the last 168 hours.  BNP (last 3 results) No results for input(s): BNP in the last 8760 hours.  ProBNP (last 3 results) No results for input(s): PROBNP in the last 8760  hours.  CBG: No results for input(s): GLUCAP in the last 168 hours.  Radiological Exams on Admission: US Renal  04/16/2015  CLINICAL DATA:  70 year old male with stage III chronic renal disease. Hyperuricemia. Previous prostatectomy. Initial encounter. EXAM: RENAL / URINARY TRACT ULTRASOUND COMPLETE COMPARISON:  None. FINDINGS: Right Kidney: Length: 10.9 cm. Generalized renal cortical thinning. Echogenicity within normal limits. No mass or hydronephrosis visualized. Left Kidney: Length: 12.5 cm. Generalized renal cortical thinning. Superimposed 7 cm simple midpole exophytic cyst (image 45). Echogenicity within normal limits. No mass or hydronephrosis visualized. Bladder: Diminutive bladder, within normal limits. IMPRESSION: No acute findings. Bilateral renal cortical atrophy. 7 cm simple left renal cyst. Electronically Signed   By: Genevie Ann M.D.   On: 04/16/2015 13:58    EKG: Independently reviewed. ST depression in the ant-lat leads  Assessment/Plan Principal Problem:   Symptomatic anemia Active Problems:   HTN (hypertension)   High cholesterol   COPD (chronic obstructive pulmonary disease) ([facility])   Aortic stenosis   Paroxysmal atrial flutter ([facility])   GI bleed   Anemia   Chronic anticoagulation   SOB (shortness of breath)   1. Symptomatic anemia. Will transfuse 2  units of PRBCs. Check anemia panel. 2. Heme-positive stools. Patient denies any recent NSAID use. He is on chronic anticoagulation. He is on Protonix once a day, will increase to twice a day. Will request GI input to see if endoscopy is necessary. We'll keep nothing by mouth after midnight 3. Paroxysmal atrial flutter. Currently in sinus rhythm. INR is currently 2.0. Will hold off on further warfarin at this point. Will not give vitamin K and let INR drift down naturally. 4. Aortic stenosis. Patient has been referred to cardiothoracic surgery for further evaluation 5. COPD. No evidence of wheezing at this time. Continue bronchodilators as needed. 6. Hypertension. Continue outpatient medicine 7. Hyperlipidemia. Continue statin    Code Status: full code DVT Prophylaxis: scd Family Communication: discussed with wife and daughter at the bedside Disposition Plan: discharge home once improved  Time spent: 66mins  Glynna Failla [facility] Pager [phone]

## 2015-04-17 NOTE — Telephone Encounter (Signed)
-----   Message from [name], MD sent at 04/16/2015  4:39 PM EST ----- Regarding: RE: critical HGB Stop warfarin. Go to hospital.  ----- Message -----    From: Drema Dallas, CMA    Sent: 04/16/2015   3:00 PM      To: [name], MD Subject: critical HGB                                   [facility] lab called with a critical HGB of 6.3 on Ryan Shea. MRN [id number]

## 2015-04-17 NOTE — Telephone Encounter (Signed)
Pt has apt today at 3 pm with Dr Bethel Born with answering service Kyra Manges that pt has critical labs and to have them call me as soon as phones open at 9 am.In hopes that pt shows up for his apt today and they can direct him to the ED

## 2015-04-17 NOTE — Telephone Encounter (Signed)
LM on home machine for patient to HOLD coumadin and go direct to the ED as he has critical lab values

## 2015-04-17 NOTE — ED Provider Notes (Signed)
7CSN: [id number]     Arrival date & time 04/17/15  1049 History  By signing my name below, I, Hansel Feinstein, attest that this documentation has been prepared under the direction and in the presence of Merrily Pew, MD. Electronically Signed: Hansel Feinstein, ED Scribe. 04/17/2015. 3:59 PM.    Chief Complaint  Patient presents with  . Weakness  . Abnormal Lab   The history is provided by the patient. No language interpreter was used.    HPI Comments: Ryan Shea is a 70 y.o. male with h/o HTN, asthma, COPD, cardiomyopathy, gastric ulcers who presents to the Emergency Department complaining of moderate constipation for 3 days with associated weakness. Per wife, he almost fell yesterday due to increased weakness. Pt states his legs feel weak. Wife notes that he seems pale. Pt is not home O2 dependent. He denies hematochezia, abdominal pain, CP, SOB, fever, emesis, hematemesis, hemoptysis. Pt notes he has dark stool at baseline due to his daily iron. Per pt, it is typical for him to not have a BM for days at a time. No h/o diverticulosis.   Past Medical History  Diagnosis Date  . Asthma   . Hypertension   . COPD (chronic obstructive pulmonary disease) ([place])   . Cancer [facility])     Prostate   . Helicobacter pylori gastritis MAR 2016  . Multiple duodenal ulcers MAR 2016  . Atrial flutter ([place])   . Aortic valve stenosis   . Cardiomyopathy ([place])   . Mitral regurgitation   . Gout    Past Surgical History  Procedure Laterality Date  . Prostate surgery    . Hernia repair    . Esophagogastroduodenoscopy N/A 05/31/2014    H PYLORI GASTRITIS, DUODENAL ULCERS  . Colonoscopy    . Colonoscopy N/A 08/25/2014    Procedure: COLONOSCOPY;  Surgeon: Rogene Houston, MD;  Location: [facility];  Service: Endoscopy;  Laterality: N/A;  . Cardiac catheterization N/A 03/26/2015    Procedure: Right/Left Heart Cath and Coronary Angiography;  Surgeon: Burnell Blanks, MD;  Location: [facility];   Service: Cardiovascular;  Laterality: N/A;   Family History  Problem Relation Age of Onset  . Stroke Mother   . Cancer Brother   . Cancer Other   . Colon cancer Neg Hx   . Colon polyps Neg Hx    Social History  Substance Use Topics  . Smoking status: Former Smoker -- 2.00 packs/day for 20 years    Types: Cigarettes    Start date: 09/01/1959    Quit date: 09/01/1978  . Smokeless tobacco: Former Systems developer     Comment: quit 30 years ago  . Alcohol Use: No    Review of Systems  Constitutional: Negative for fever.  Respiratory: Negative for shortness of breath.   Cardiovascular: Negative for chest pain.  Gastrointestinal: Positive for constipation. Negative for abdominal pain and blood in stool.  Neurological: Positive for weakness.  All other systems reviewed and are negative.  Allergies  Review of patient's allergies indicates no known allergies.  Home Medications   Prior to Admission medications   Medication Sig Start Date End Date Taking? Authorizing Provider  albuterol (PROVENTIL) (2.5 MG/3ML) 0.083% nebulizer solution Take 3 mLs (2.5 mg total) by nebulization every 4 (four) hours as needed for wheezing or shortness of breath. 03/03/15  Yes Kathie Dike, MD  allopurinol (ZYLOPRIM) 100 MG tablet Take 1 tablet by mouth daily. 03/06/15  Yes Historical Provider, MD  aspirin 81 MG chewable  tablet Chew 1 tablet (81 mg total) by mouth daily. 03/27/15  Yes Florencia Reasons, MD  carvedilol (COREG) 6.25 MG tablet Take 1 tablet (6.25 mg total) by mouth 2 (two) times daily with a meal. 05/23/13  Yes [name], MD  colchicine 0.6 MG tablet Take 1 tablet (0.6 mg total) by mouth daily. 03/03/15  Yes Kathie Dike, MD  ferrous sulfate 325 (65 FE) MG tablet Take 325 mg by mouth at bedtime.    Yes Historical Provider, MD  Fluticasone-Salmeterol (ADVAIR DISKUS) 250-50 MCG/DOSE AEPB Inhale 1 puff into the lungs daily. 08/25/14  Yes Kathie Dike, MD  furosemide (LASIX) 40 MG tablet Take 1 tablet  (40 mg total) by mouth daily as needed for fluid or edema. 03/27/15  Yes Florencia Reasons, MD  gabapentin (NEURONTIN) 300 MG capsule Take 300 mg by mouth 3 (three) times daily as needed (for pain/neuropathy).  02/23/14  Yes Historical Provider, MD  pantoprazole (PROTONIX) 40 MG tablet Take 1 tablet (40 mg total) by mouth daily. 08/25/14  Yes Kathie Dike, MD  pravastatin (PRAVACHOL) 40 MG tablet Take 1 tablet by mouth at bedtime.  07/06/13  Yes Historical Provider, MD  tiotropium (SPIRIVA HANDIHALER) 18 MCG inhalation capsule Place 1 capsule (18 mcg total) into inhaler and inhale daily. 03/03/15  Yes Kathie Dike, MD  VENTOLIN HFA 108 (90 BASE) MCG/ACT inhaler Inhale 1 puff into the lungs every 6 (six) hours as needed for wheezing or shortness of breath.  05/10/14  Yes Historical Provider, MD  warfarin (COUMADIN) 1 MG tablet Take 3.5 tablets (3.5 mg total) by mouth daily at 6 PM. Patient taking differently: Take 2.5 mg by mouth daily at 6 PM. HOLD CRITICAL LOW HGB 04/17/15 03/27/15  Yes Florencia Reasons, MD  zolpidem (AMBIEN) 10 MG tablet Take 0.5 tablets (5 mg total) by mouth at bedtime as needed for sleep. 03/27/15  Yes Florencia Reasons, MD   BP 122/62 mmHg  Pulse 90  Temp(Src) 98.1 F (36.7 C) (Oral)  Resp 18  Ht 5\' 6"  (1.676 m)  Wt 240 lb (108.863 kg)  BMI 38.76 kg/m2  SpO2 98% Physical Exam  Constitutional: He is oriented to person, place, and time. He appears well-developed and well-nourished.  HENT:  Head: Normocephalic and atraumatic.  Eyes: EOM are normal. Pupils are equal, round, and reactive to light.  Mild conjunctival pallor.   Neck: Normal range of motion. Neck supple.  Cardiovascular: Normal rate, regular rhythm and normal heart sounds.  Exam reveals no gallop and no friction rub.   No murmur heard. Pulmonary/Chest: Effort normal and breath sounds normal. No respiratory distress. He has no wheezes. He has no rales.  Abdominal: Soft. Bowel sounds are normal. He exhibits no distension and no mass. There is  no tenderness. There is no rebound and no guarding.  Genitourinary: Prostate normal.  Chaperone present throughout entire exam.    Musculoskeletal: Normal range of motion.  Neurological: He is alert and oriented to person, place, and time.  Skin: Skin is warm and dry.  Psychiatric: He has a normal mood and affect. His behavior is normal.  Nursing note and vitals reviewed.   ED Course  Procedures (including critical care time) DIAGNOSTIC STUDIES: Oxygen Saturation is 97% on [place] 2L O2, adequate by my interpretation.    COORDINATION OF CARE: 11:18 AM Discussed treatment plan with pt at bedside and pt agreed to plan.   Labs Review Labs Reviewed  CBC WITH DIFFERENTIAL/PLATELET - Abnormal; Notable for the following:  RBC 2.06 (*)    Hemoglobin 6.2 (*)    HCT 20.4 (*)    RDW 18.6 (*)    All other components within normal limits  COMPREHENSIVE METABOLIC PANEL - Abnormal; Notable for the following:    CO2 33 (*)    Glucose, Bld 123 (*)    Calcium 8.4 (*)    Total Protein 6.4 (*)    Albumin 2.8 (*)    ALT 15 (*)    All other components within normal limits  PROTIME-INR - Abnormal; Notable for the following:    Prothrombin Time 22.6 (*)    INR 2.01 (*)    All other components within normal limits  POC OCCULT BLOOD, ED  TYPE AND SCREEN  PREPARE RBC (CROSSMATCH)    Imaging Review US Renal  04/16/2015  CLINICAL DATA:  70 year old male with stage III chronic renal disease. Hyperuricemia. Previous prostatectomy. Initial encounter. EXAM: RENAL / URINARY TRACT ULTRASOUND COMPLETE COMPARISON:  None. FINDINGS: Right Kidney: Length: 10.9 cm. Generalized renal cortical thinning. Echogenicity within normal limits. No mass or hydronephrosis visualized. Left Kidney: Length: 12.5 cm. Generalized renal cortical thinning. Superimposed 7 cm simple midpole exophytic cyst (image 45). Echogenicity within normal limits. No mass or hydronephrosis visualized. Bladder: Diminutive bladder, within normal  limits. IMPRESSION: No acute findings. Bilateral renal cortical atrophy. 7 cm simple left renal cyst. Electronically Signed   By: Genevie Ann M.D.   On: 04/16/2015 13:58   I have personally reviewed and evaluated these images and lab results as part of my medical decision-making.   EKG Interpretation   Date/Time:  Thursday April 17 2015 11:01:11 EST Ventricular Rate:  94 PR Interval:  164 QRS Duration: 79 QT Interval:  314 QTC Calculation: 393 R Axis:   63 Text Interpretation:  Sinus rhythm Left atrial enlargement Repol abnrm  suggests ischemia, diffuse leads Baseline wander in lead(s) V3 ST  depressions/t wave changes  more significant than 03/22/15 Confirmed by  [facility] MD, Corene Cornea [phone]) on 04/17/2015 11:12:06 AM      MDM   Final diagnoses:  Anemia, unspecified  Blood in stool    70 year old male sent here secondary to anemia and weakness, no other real complaints. Found to have a hemoglobin of 6.2 here and a Hemoccult positive stool samples well. Has history of bleeding ulcers however unsure what is causing this at this time. Transfusion ordered and patient will be admitted to medicine for further evaluation and management.  I personally performed the services described in this documentation, which was scribed in my presence. The recorded information has been reviewed and is accurate.     Merrily Pew, MD 04/17/15 [phone]

## 2015-04-18 ENCOUNTER — Encounter (HOSPITAL_COMMUNITY)
Admission: EM | Disposition: A | Discharge status: Home / Self Care | Payer: Self-pay | Source: Home / Self Care | Attending: Internal Medicine

## 2015-04-18 DIAGNOSIS — R0602 Shortness of breath: Secondary | ICD-10-CM | POA: Insufficient documentation

## 2015-04-18 DIAGNOSIS — K921 Melena: Secondary | ICD-10-CM

## 2015-04-18 DIAGNOSIS — I10 Essential (primary) hypertension: Secondary | ICD-10-CM

## 2015-04-18 DIAGNOSIS — Z7901 Long term (current) use of anticoagulants: Secondary | ICD-10-CM

## 2015-04-18 HISTORY — PX: GIVENS CAPSULE STUDY: SHX5432

## 2015-04-18 LAB — BASIC METABOLIC PANEL
Anion gap: 6 (ref 5–15)
BUN: 13 mg/dL (ref 6–20)
CALCIUM: 8.5 mg/dL — AB (ref 8.9–10.3)
CHLORIDE: 103 mmol/L (ref 101–111)
CO2: 31 mmol/L (ref 22–32)
CREATININE: 1.04 mg/dL (ref 0.61–1.24)
Glucose, Bld: 104 mg/dL — ABNORMAL HIGH (ref 65–99)
Potassium: 3.8 mmol/L (ref 3.5–5.1)
SODIUM: 140 mmol/L (ref 135–145)

## 2015-04-18 LAB — CBC
HCT: 26.1 % — ABNORMAL LOW (ref 39.0–52.0)
Hemoglobin: 8.3 g/dL — ABNORMAL LOW (ref 13.0–17.0)
MCH: 30.2 pg (ref 26.0–34.0)
MCHC: 31.8 g/dL (ref 30.0–36.0)
MCV: 94.9 fL (ref 78.0–100.0)
PLATELETS: 351 10*3/uL (ref 150–400)
RBC: 2.75 MIL/uL — AB (ref 4.22–5.81)
RDW: 18.4 % — AB (ref 11.5–15.5)
WBC: 9.8 10*3/uL (ref 4.0–10.5)

## 2015-04-18 LAB — TYPE AND SCREEN
ABO/RH(D): O POS
ANTIBODY SCREEN: NEGATIVE
UNIT DIVISION: 0
Unit division: 0

## 2015-04-18 LAB — OCCULT BLOOD, POC DEVICE: Fecal Occult Bld: POSITIVE — AB

## 2015-04-18 LAB — IRON AND TIBC
Iron: 17 ug/dL — ABNORMAL LOW (ref 45–182)
Saturation Ratios: 5 % — ABNORMAL LOW (ref 17.9–39.5)
TIBC: 337 ug/dL (ref 250–450)
UIBC: 320 ug/dL

## 2015-04-18 LAB — PROTIME-INR
INR: 1.96 — AB (ref 0.00–1.49)
Prothrombin Time: 22.3 seconds — ABNORMAL HIGH (ref 11.6–15.2)

## 2015-04-18 LAB — FOLATE: FOLATE: 21.1 ng/mL (ref >5.9)

## 2015-04-18 LAB — VITAMIN B12: VITAMIN B 12: 298 pg/mL (ref 180–914)

## 2015-04-18 LAB — FERRITIN: Ferritin: 34 ng/mL (ref 24–336)

## 2015-04-18 SURGERY — IMAGING PROCEDURE, GI TRACT, INTRALUMINAL, VIA CAPSULE

## 2015-04-18 MED ORDER — TIOTROPIUM BROMIDE MONOHYDRATE 18 MCG IN CAPS
ORAL_CAPSULE | RESPIRATORY_TRACT | Status: AC
  Filled 2015-04-18: qty 5

## 2015-04-18 MED ORDER — MOMETASONE FURO-FORMOTEROL FUM 100-5 MCG/ACT IN AERO
INHALATION_SPRAY | RESPIRATORY_TRACT | Status: AC
  Filled 2015-04-18: qty 8.8

## 2015-04-18 MED ORDER — FUROSEMIDE 10 MG/ML IJ SOLN
40.0000 mg | Freq: Once | INTRAMUSCULAR | Status: AC
  Administered 2015-04-18: 40 mg via INTRAVENOUS
  Filled 2015-04-18: qty 4

## 2015-04-18 NOTE — Progress Notes (Signed)
[facility] PROGRESS NOTE  Ryan Shea [id number] DOB: 09-01-1945 DOA: 04/17/2015 PCP: Shade Flood, MD  Assessment/Plan: 1. Symptomatic anemia. HGB 6.2 on admission, currently 8.3 s/p transfusion of 2 units. Anemia panel in process. TSH is normal.  2. Heme-positive stools. Patient denies any recent NSAID use. He is on chronic anticoagulation. INR was in low therapeutic range. Protonix has been increased to twice a day. GI has been consulted. NPO for any anticipated procedures today. 3. Paroxysmal atrial flutter. Currently in sinus rhythm. INR is trending down, currently 1.96. Will hold off on further warfarin at this point. Will not give vitamin K and let INR drift down naturally. 4. Aortic stenosis. Patient has been referred to cardiothoracic surgery for further evaluation 5. COPD. Mild wheezing bilaterally, will continue bronchodilators. 6. Hypertension, stable. Continue outpatient medicine 7. Hyperlipidemia. Continue statin  Code Status: Full DVT prophylaxis: SCD's Family Communication: No family at bedside Disposition Plan: Discharge home once improved   Consultants:  GI  Procedures:  PRBCs transfusion  Antibiotics:  none  HPI/Subjective: Doing well. Shortness of breath is better, doesn't feel lightheaded or dizzy. Can get up and move without SOB. Denies having any BMs. He denies no stomach pain  Objective: Filed Vitals:   04/17/15 2241 04/18/15 0609  BP: 144/67 120/72  Pulse: 97 89  Temp: 99.1 F (37.3 C) 98.9 F (37.2 C)  Resp: 19 18    Intake/Output Summary (Last 24 hours) at 04/18/15 0825 Last data filed at 04/18/15 0610  Gross per 24 hour  Intake  627.5 ml  Output    850 ml  Net -222.5 ml   Filed Weights   04/17/15 1102 04/17/15 1345  Weight: 108.863 kg (240 lb) 108.863 kg (240 lb)    Exam:  General: NAD, looks comfortable Cardiovascular: RRR, S1, S2  Respiratory: Mild wheeze bilaterally with some crackles at his bases.   Abdomen: soft, non tender, no distention , bowel sounds normal Musculoskeletal: 1+ edema b/l   Data Reviewed: Basic Metabolic Panel:  Recent Labs Lab 04/16/15 1418 04/17/15 1112 04/18/15 0603  NA 140 141 140  K 3.8 3.6 3.8  CL 100* 102 103  CO2 31 33* 31  GLUCOSE 121* 123* 104*  BUN 21* 18 13  CREATININE 1.35* 1.13 1.04  CALCIUM 8.6* 8.4* 8.5*   Liver Function Tests:  Recent Labs Lab 04/17/15 1112  AST 19  ALT 15*  ALKPHOS 73  BILITOT 0.5  PROT 6.4*  ALBUMIN 2.8*   CBC:  Recent Labs Lab 04/16/15 1418 04/17/15 1112 04/18/15 0603  WBC 11.2* 8.6 9.8  NEUTROABS  --  6.7  --   HGB 6.3* 6.2* 8.3*  HCT 20.3* 20.4* 26.1*  MCV 97.6 99.0 94.9  PLT 329 350 351    Studies: X-ray Chest Pa And Lateral  04/17/2015  CLINICAL DATA:  Sob increasingly worse over last few months, heme positive stools. Pt stated he was being treated by cardiologist who wants to schedule surgery as soon as breathing/stools clear up. History of asthma, chronic mild cough, COPD, prostate cancer, hypertension, aortic valve stenosis and cardiomyopathy. EXAM: CHEST  2 VIEW COMPARISON:  03/25/2015 FINDINGS: Cardiac silhouette is mildly enlarged. No mediastinal or hilar masses or evidence of adenopathy. Clear lungs.  No pleural effusion or pneumothorax. Bony thorax is demineralized but intact. IMPRESSION: No acute cardiopulmonary disease. Electronically Signed   By: Lajean Manes M.D.   On: 04/17/2015 20:51   US Renal  04/16/2015  CLINICAL DATA:  70 year old male with stage  III chronic renal disease. Hyperuricemia. Previous prostatectomy. Initial encounter. EXAM: RENAL / URINARY TRACT ULTRASOUND COMPLETE COMPARISON:  None. FINDINGS: Right Kidney: Length: 10.9 cm. Generalized renal cortical thinning. Echogenicity within normal limits. No mass or hydronephrosis visualized. Left Kidney: Length: 12.5 cm. Generalized renal cortical thinning. Superimposed 7 cm simple midpole exophytic cyst (image 45). Echogenicity  within normal limits. No mass or hydronephrosis visualized. Bladder: Diminutive bladder, within normal limits. IMPRESSION: No acute findings. Bilateral renal cortical atrophy. 7 cm simple left renal cyst. Electronically Signed   By: Genevie Ann M.D.   On: 04/16/2015 13:58    Scheduled Meds: . allopurinol  100 mg Oral Daily  . carvedilol  6.25 mg Oral BID WC  . colchicine  0.6 mg Oral Daily  . mometasone-formoterol  2 puff Inhalation BID  . pantoprazole  40 mg Oral BID AC  . pravastatin  40 mg Oral QHS  . sodium chloride flush  3 mL Intravenous Q12H  . tiotropium  18 mcg Inhalation Daily   Continuous Infusions:   Principal Problem:   Symptomatic anemia Active Problems:   HTN (hypertension)   High cholesterol   COPD (chronic obstructive pulmonary disease) ([facility])   Aortic stenosis   Paroxysmal atrial flutter ([facility])   GI bleed   Anemia   Chronic anticoagulation   SOB (shortness of breath)    Time spent: 25 minutes    Kathie Dike, MD. [facility] Pager [phone]. If 7PM-7AM, please contact night-coverage at [email or web address], password [facility] 04/18/2015, 8:25 AM  LOS: 1 day     By signing my name below, I, Delene Ruffini, attest that this documentation has been prepared under the direction and in the presence of Kathie Dike, MD. Electronically Signed: Delene Ruffini 04/18/2015  10:58am   I, Dr. Kathie Dike, personally performed the services described in this documentaiton. All medical record entries made by the scribe were at my direction and in my presence. I have reviewed the chart and agree that the record reflects my personal performance and is accurate and complete  Kathie Dike, MD, 04/18/2015 11:06 AM

## 2015-04-18 NOTE — Progress Notes (Signed)
Gave report to [employer]. Verbalized understanding. Patient to have clear, colorless liquids at 1650 and sandwich (chips) at 2050. Recording device to be taken off at 2050. No MRI's until capsule passes. Advised AC to have recorder taken off at 2050-2100. Patient to tell nurse if blue light stops blinking.

## 2015-04-18 NOTE — Care Management Important Message (Signed)
Important Message  Patient Details  Name: Ryan Shea MRN: [id number] Date of Birth: 11-15-1945   Medicare Important Message Given:  Yes    Alvie Heidelberg, RN 04/18/2015, 9:39 AM

## 2015-04-18 NOTE — Consult Note (Signed)
Referring Provider: No ref. provider found Primary Care Physician:  Shade Flood, MD Primary Gastroenterologist:  Dr. Oneida Alar  Date of Admission:  Date of Consultation:   Reason for Consultation:  Symptomatic anemia, heme+ stool, on chronic coumadin use  HPI:  70 year old male with a past medical history of hypertension, COPD, prostate cancer, H. Pylori gastritis, multiple duodenal ulcers, atrial flutter on chronic coumadin anticoagulation presented tot he ED from PCP office with acute symptomatic anemia. He was hospitalized last year with GI bleed and increasing fatigue and dyspnea, melena and hgb 4.3 on admission, increased to 8.8 s/p 4 u PRBCs. Also with supratheraputic INR on admission of 6. EGD performed which found H. Pylori gastritis and duodenal ulcers, H. Pylori successfully treated. He recently had a colonoscopy 08/25/14 which found no evidence of fresh or old blood in the colon, moderate sigmoid diverticula, small hemorrhoids.   For this admission he presented to the ER 04/17/15 with progressive shortness of breath for several months, worsening over the past few weeks. No noted melena or hematochezia per ER notes. CBC showed hgb 6.3 and heme+ stools without evidence of gross blood. He was admitted and transfused 2 units of PRBCs with appropriate rise in hgb to 8.3. INR on admission was 2.27. His coumadin was held with goal of slow INR drift without Vitamin K.   Today he states he felt bad a week ago, progressive weakness, worse with standing. Went to PCP who found Hgb 6.6, sent to ER. Denies overt GIB, no melena, hematochezia, hematemesis, abdominal pain, GERD symptoms, change in bowel habits. No other GI complaints. He feels better today after receiving blood. Still has not seen any frank bleeding.  Past Medical History  Diagnosis Date  . Asthma   . Hypertension   . COPD (chronic obstructive pulmonary disease) ([place])   . Cancer [facility])     Prostate   . Helicobacter pylori gastritis  MAR 2016  . Multiple duodenal ulcers MAR 2016  . Atrial flutter ([place])   . Aortic valve stenosis   . Cardiomyopathy ([place])   . Mitral regurgitation   . Gout     Past Surgical History  Procedure Laterality Date  . Prostate surgery    . Hernia repair    . Esophagogastroduodenoscopy N/A 05/31/2014    H PYLORI GASTRITIS, DUODENAL ULCERS  . Colonoscopy    . Colonoscopy N/A 08/25/2014    Procedure: COLONOSCOPY;  Surgeon: Rogene Houston, MD;  Location: [facility];  Service: Endoscopy;  Laterality: N/A;  . Cardiac catheterization N/A 03/26/2015    Procedure: Right/Left Heart Cath and Coronary Angiography;  Surgeon: Burnell Blanks, MD;  Location: [facility];  Service: Cardiovascular;  Laterality: N/A;    Prior to Admission medications   Medication Sig Start Date End Date Taking? Authorizing Provider  albuterol (PROVENTIL) (2.5 MG/3ML) 0.083% nebulizer solution Take 3 mLs (2.5 mg total) by nebulization every 4 (four) hours as needed for wheezing or shortness of breath. 03/03/15  Yes Kathie Dike, MD  allopurinol (ZYLOPRIM) 100 MG tablet Take 1 tablet by mouth daily. 03/06/15  Yes Historical Provider, MD  aspirin 81 MG chewable tablet Chew 1 tablet (81 mg total) by mouth daily. 03/27/15  Yes Florencia Reasons, MD  carvedilol (COREG) 6.25 MG tablet Take 1 tablet (6.25 mg total) by mouth 2 (two) times daily with a meal. 05/23/13  Yes [name], MD  colchicine 0.6 MG tablet Take 1 tablet (0.6 mg total) by mouth daily. 03/03/15  Yes Kathie Dike, MD  ferrous sulfate 325 (65 FE) MG tablet Take 325 mg by mouth at bedtime.    Yes Historical Provider, MD  Fluticasone-Salmeterol (ADVAIR DISKUS) 250-50 MCG/DOSE AEPB Inhale 1 puff into the lungs daily. 08/25/14  Yes Kathie Dike, MD  furosemide (LASIX) 40 MG tablet Take 1 tablet (40 mg total) by mouth daily as needed for fluid or edema. 03/27/15  Yes Florencia Reasons, MD  gabapentin (NEURONTIN) 300 MG capsule Take 300 mg by mouth 3 (three) times  daily as needed (for pain/neuropathy).  02/23/14  Yes Historical Provider, MD  pantoprazole (PROTONIX) 40 MG tablet Take 1 tablet (40 mg total) by mouth daily. 08/25/14  Yes Kathie Dike, MD  pravastatin (PRAVACHOL) 40 MG tablet Take 1 tablet by mouth at bedtime.  07/06/13  Yes Historical Provider, MD  tiotropium (SPIRIVA HANDIHALER) 18 MCG inhalation capsule Place 1 capsule (18 mcg total) into inhaler and inhale daily. 03/03/15  Yes Kathie Dike, MD  VENTOLIN HFA 108 (90 BASE) MCG/ACT inhaler Inhale 1 puff into the lungs every 6 (six) hours as needed for wheezing or shortness of breath.  05/10/14  Yes Historical Provider, MD  warfarin (COUMADIN) 1 MG tablet Take 3.5 tablets (3.5 mg total) by mouth daily at 6 PM. Patient taking differently: Take 2.5 mg by mouth daily at 6 PM. HOLD CRITICAL LOW HGB 04/17/15 03/27/15  Yes Florencia Reasons, MD  zolpidem (AMBIEN) 10 MG tablet Take 0.5 tablets (5 mg total) by mouth at bedtime as needed for sleep. 03/27/15  Yes Florencia Reasons, MD    Current Facility-Administered Medications  Medication Dose Route Frequency Provider Last Rate Last Dose  . 0.9 %  sodium chloride infusion  250 mL Intravenous PRN Kathie Dike, MD      . acetaminophen (TYLENOL) tablet 650 mg  650 mg Oral Q6H PRN Kathie Dike, MD       Or  . acetaminophen (TYLENOL) suppository 650 mg  650 mg Rectal Q6H PRN Kathie Dike, MD      . albuterol (PROVENTIL) (2.5 MG/3ML) 0.083% nebulizer solution 2.5 mg  2.5 mg Nebulization Q4H PRN Kathie Dike, MD      . allopurinol (ZYLOPRIM) tablet 100 mg  100 mg Oral Daily Kathie Dike, MD   100 mg at 04/18/15 0835  . carvedilol (COREG) tablet 6.25 mg  6.25 mg Oral BID WC Kathie Dike, MD   6.25 mg at 04/18/15 0835  . colchicine tablet 0.6 mg  0.6 mg Oral Daily Kathie Dike, MD   0.6 mg at 04/18/15 0835  . gabapentin (NEURONTIN) capsule 300 mg  300 mg Oral TID PRN Kathie Dike, MD   300 mg at 04/17/15 2240  . mometasone-formoterol (DULERA) 100-5 MCG/ACT inhaler 2  puff  2 puff Inhalation BID Kathie Dike, MD   2 puff at 04/18/15 0917  . ondansetron (ZOFRAN) tablet 4 mg  4 mg Oral Q6H PRN Kathie Dike, MD       Or  . ondansetron (ZOFRAN) injection 4 mg  4 mg Intravenous Q6H PRN Kathie Dike, MD      . pantoprazole (PROTONIX) EC tablet 40 mg  40 mg Oral BID AC Kathie Dike, MD   40 mg at 04/18/15 0835  . pravastatin (PRAVACHOL) tablet 40 mg  40 mg Oral QHS Kathie Dike, MD   40 mg at 04/17/15 2240  . sodium chloride flush (NS) 0.9 % injection 3 mL  3 mL Intravenous Q12H Kathie Dike, MD   3 mL at 04/17/15 2241  .  sodium chloride flush (NS) 0.9 % injection 3 mL  3 mL Intravenous PRN Kathie Dike, MD      . tiotropium (SPIRIVA) inhalation capsule 18 mcg  18 mcg Inhalation Daily Kathie Dike, MD   18 mcg at 04/18/15 0917  . zolpidem (AMBIEN) tablet 5 mg  5 mg Oral QHS PRN Kathie Dike, MD   5 mg at 04/17/15 2240    Allergies as of 04/17/2015  . (No Known Allergies)    Family History  Problem Relation Age of Onset  . Stroke Mother   . Cancer Brother   . Cancer Other   . Colon cancer Neg Hx   . Colon polyps Neg Hx     Social History   Social History  . Marital Status: Married    Spouse Name: N/A  . Number of Children: N/A  . Years of Education: N/A   Occupational History  . Not on file.   Social History Main Topics  . Smoking status: Former Smoker -- 2.00 packs/day for 20 years    Types: Cigarettes    Start date: 09/01/1959    Quit date: 09/01/1978  . Smokeless tobacco: Former Systems developer     Comment: quit 30 years ago  . Alcohol Use: No  . Drug Use: No  . Sexual Activity: Not on file   Other Topics Concern  . Not on file   Social History Narrative    Review of Systems: Gen: Denies fever, chills, loss of appetite, change in weight or weight loss CV: Denies chest pain, heart palpitations, syncope.  Resp: Denies shortness of breath with rest, cough, wheezing GI: See HPI.  MS: Admits joint pain and swelling related to  gout. Psych: Denies depression, anxiety,confusion, or memory loss Heme: Denies bruising, bleeding.  Physical Exam: Vital signs in last 24 hours: Temp:  [97.7 F (36.5 C)-99.1 F (37.3 C)] 98.9 F (37.2 C) (02/10 0609) Pulse Rate:  [86-103] 89 (02/10 0609) Resp:  [11-20] 18 (02/10 0609) BP: (103-144)/(52-72) 120/72 mmHg (02/10 0609) SpO2:  [94 %-100 %] 96 % (02/10 0917) Weight:  [240 lb (108.863 kg)] 240 lb (108.863 kg) (02/09 1345) Last BM Date: 04/17/15 General:   Alert,  Well-developed, well-nourished, pleasant and cooperative in NAD Head:  Normocephalic and atraumatic. Eyes:  Sclera clear, no icterus. Conjunctiva pink. Ears:  Normal auditory acuity. Neck:  Supple; no obvious masses or thyromegaly. Lungs:  Clear throughout to auscultation. No wheezes, crackles, or rhonchi. No acute distress. Heart:  Regular rate and rhythm; no murmurs, clicks, rubs,  or gallops. Abdomen:  Rounded/obese but soft, nontender and nondistended. Normal bowel sounds, without guarding, and without rebound.   Rectal:  Deferred.   Msk:  Symmetrical without gross deformities. Pulses:  Normal DP pulses noted. Extremities:  Without clubbing or edema. Neurologic:  Alert and  oriented x4;  grossly normal neurologically. Psych:  Alert and cooperative. Normal mood and affect.  Intake/Output from previous day: 02/09 0701 - 02/10 0700 In: 627.5 [Blood:627.5] Out: 850 [Urine:850] Intake/Output this shift:    Lab Results:  Recent Labs  04/16/15 1418 04/17/15 1112 04/18/15 0603  WBC 11.2* 8.6 9.8  HGB 6.3* 6.2* 8.3*  HCT 20.3* 20.4* 26.1*  PLT 329 350 351   BMET  Recent Labs  04/16/15 1418 04/17/15 1112 04/18/15 0603  NA 140 141 140  K 3.8 3.6 3.8  CL 100* 102 103  CO2 31 33* 31  GLUCOSE 121* 123* 104*  BUN 21* 18 13  CREATININE 1.35* 1.13 1.04  CALCIUM 8.6* 8.4* 8.5*   LFT  Recent Labs  04/17/15 1112  PROT 6.4*  ALBUMIN 2.8*  AST 19  ALT 15*  ALKPHOS 73  BILITOT 0.5    PT/INR  Recent Labs  04/17/15 1112 04/18/15 0603  LABPROT 22.6* 22.3*  INR 2.01* 1.96*   Hepatitis Panel No results for input(s): HEPBSAG, HCVAB, HEPAIGM, HEPBIGM in the last 72 hours. C-Diff No results for input(s): CDIFFTOX in the last 72 hours.  Studies/Results: X-ray Chest Pa And Lateral  04/17/2015  CLINICAL DATA:  Sob increasingly worse over last few months, heme positive stools. Pt stated he was being treated by cardiologist who wants to schedule surgery as soon as breathing/stools clear up. History of asthma, chronic mild cough, COPD, prostate cancer, hypertension, aortic valve stenosis and cardiomyopathy. EXAM: CHEST  2 VIEW COMPARISON:  03/25/2015 FINDINGS: Cardiac silhouette is mildly enlarged. No mediastinal or hilar masses or evidence of adenopathy. Clear lungs.  No pleural effusion or pneumothorax. Bony thorax is demineralized but intact. IMPRESSION: No acute cardiopulmonary disease. Electronically Signed   By: Lajean Manes M.D.   On: 04/17/2015 20:51   US Renal  04/16/2015  CLINICAL DATA:  70 year old male with stage III chronic renal disease. Hyperuricemia. Previous prostatectomy. Initial encounter. EXAM: RENAL / URINARY TRACT ULTRASOUND COMPLETE COMPARISON:  None. FINDINGS: Right Kidney: Length: 10.9 cm. Generalized renal cortical thinning. Echogenicity within normal limits. No mass or hydronephrosis visualized. Left Kidney: Length: 12.5 cm. Generalized renal cortical thinning. Superimposed 7 cm simple midpole exophytic cyst (image 45). Echogenicity within normal limits. No mass or hydronephrosis visualized. Bladder: Diminutive bladder, within normal limits. IMPRESSION: No acute findings. Bilateral renal cortical atrophy. 7 cm simple left renal cyst. Electronically Signed   By: Genevie Ann M.D.   On: 04/16/2015 13:58    Impression: 70 year old male with a history of significant GI bleed on coumadin anticoagulation presents with acute drop in hgb to 6-range. Appropriate response  with 2 units PRBC transfusion. Has been symptomatic including prolonged progressive dyspnea with significant worsening in the past 1-2 weeks. History of gastritis and duodenal ulcers. Currently on ASA 81 daily in addition to coumadin, which likely contributes to his situation. Given the prolonged nature of his symptoms and no obvious bleeding likely slow GI bleed which reached a tipping point within the past week or two.  Differentials include slow bleed from gastritis, duodenitis, erosions, or ulcers. Also possible AVMs or other small bowel etiology. Discussed case with Dr. Oneida Alar and given recent colonoscopy and EGD will proceed with Givens capsule for further evaluation.  Proceed with Givens Capsule endoscopy with Dr. Oneida Alar in near future: the risks, benefits, and alternatives have been discussed with the patient in detail. The patient states understanding and desires to proceed.   Plan: 1. Continue PPI 2. Givens capsule endoscopy today 3. Can have clear liquids after 4 pm today 4. Monitor for any obvious GI bleed 5. Follow H/H closely for any changes 6. Transfuse as necessary 7. Continue to hold coumadin, restart per cardiology based on risks/benefits 8. Avoid NSAIDs as much as possible. 9. Supportive measures   Walden Field, AGNP-C Adult & Gerontological Nurse Practitioner [facility]    LOS: 1 day     04/18/2015, 10:31 AM

## 2015-04-19 ENCOUNTER — Telehealth: Payer: Self-pay | Admitting: Gastroenterology

## 2015-04-19 LAB — CBC
HEMATOCRIT: 27.8 % — AB (ref 39.0–52.0)
HEMOGLOBIN: 8.9 g/dL — AB (ref 13.0–17.0)
MCH: 30 pg (ref 26.0–34.0)
MCHC: 32 g/dL (ref 30.0–36.0)
MCV: 93.6 fL (ref 78.0–100.0)
Platelets: 375 10*3/uL (ref 150–400)
RBC: 2.97 MIL/uL — ABNORMAL LOW (ref 4.22–5.81)
RDW: 17.3 % — ABNORMAL HIGH (ref 11.5–15.5)
WBC: 8.5 10*3/uL (ref 4.0–10.5)

## 2015-04-19 NOTE — Telephone Encounter (Signed)
PT NEEDS TO SEE HEMATOLOGY FOR NORMOCYTIC, TRANSFUSION DEPENDENT ANEMIA. OPV W/ SLF IN 3 WEEKS E30. CBC NEXT FRI FEB 17.

## 2015-04-19 NOTE — Procedures (Signed)
PMHX: NSTEMI JAN 2017/SEVERE AORTIC STENOSIS/AFIB  PATIENT DATA: WEIGHT:  239 LBS     HEIGHT:  66    GASTRIC PASSAGE TIME: 64 m, SB PASSAGE TIME: 5H 53m  RESULTS: LIMITED views of gastric mucosa due to retained contents.  No ULCERS, masses or AVMs seen.  RARE EROSIONS IN SMALL BOWEL(58:59). LIMITED VIEWS OF THE COLON DUE TO RETAINED CONTENTS. No old blood or fresh blood in the stomach, small bowel, or colon.  DIAGNOSIS: OCCASIONAL SB EROSION LIKELY DUE TO ASA USE. NO ACTIVE GI BLEEDING.  Plan: 1. BENEFIT/RISK ASSESSMENT-RISK OF EMBOLIC EVENT HIGH(CVA, AMI), RISK OF GI BLEED LOW. PT CURRENTLY W/O BRBR OR MELENA. CONTINUE ASA DAILY AND MAY RE-START COUMADIN 2. CBC EVERY WEEK. TRANSFUSE IF NEEDED AS AN OUTPATIENT. 3. FERRITIN Q2 WEEKS. CONSIDER IVFE IF NEEDED. 4. OPV IN 3 WEEKS WITH DR. Treazure Nery. NO ADDITIONAL GI WORKUP NEEDED AT THIS TIME. 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

## 2015-04-19 NOTE — Progress Notes (Signed)
Patient discharged with instructions, prescription, and care notes.  Verbalized understanding via teach back.  IV was removed and the site was WNL. Patient voiced no further complaints or concerns at the time of discharge.  Appointments scheduled per instructions.  Patient left the floor via w/c with staff and family in stable condition.   Removed the patients O2 and had him to ambulate 250 ft approx. After he returned to the room and his sats remained 95% or greater.

## 2015-04-19 NOTE — Discharge Summary (Signed)
Physician Discharge Summary  COPE BENZON [id number] DOB: August 16, 1945 DOA: 04/17/2015  PCP: Shade Flood, MD  Admit date: 04/17/2015 Discharge date: 04/19/2015  Time spent: 35 minutes  Recommendations for Outpatient Follow-up:  1. Follow up with PCP in 1-2 weeks 2. Follow up with GI in 3 weeks. 3. Will need repeat CBC on 04/25/15. 4. Patient will be referred to hematology as an outpatient for further evaluation of anemia.  5. Follow up with cardiothoracic surgery as previously scheduled for evaluation of aortic stenosis.     Discharge Diagnoses:  Principal Problem:   Symptomatic anemia Active Problems:   HTN (hypertension)   High cholesterol   COPD (chronic obstructive pulmonary disease) (HCC)   Aortic stenosis   Paroxysmal atrial flutter (HCC)   GI bleed   Anemia   Chronic anticoagulation   SOB (shortness of breath)   Blood in stool   Shortness of breath   Discharge Condition: Improved  Diet recommendation: Heart Healthy  Filed Weights   04/17/15 1102 04/17/15 1345  Weight: 108.863 kg (240 lb) 108.863 kg (240 lb)    History of present illness:  70 y.o. male with a history of aortic valve disease, paroxysmal atrial flutter, COPD, on chronic anticoagulation who presents to the hospital with complaints of progressive shortness of breath. He reported progressive shortness of breath for several months now, more so over the past few weeks. He denies any chest pain. Denies any orthopnea. He has had some lightheadedness over the past few weeks. He's not had any melena, hematochezia. No fever, cough. He was evaluated in the emergency room where CBC showed hemoglobin of 6.3. He was found to have heme positive stools without any gross evidence of bleeding. He was admitted for further treatments.  Hospital Course:  Patient presented with symptomatic anemia, noted to have a Hgb of 6.2 on admission. He was transfused 2U PRBCs on 2/9 and his Hgb has remained stable since. Anemia  panel revealed an iron of 17 and ferritin of 34. TSH is normal. Patient will be referred to hematology as an outpatient for further evaluation of anemia.   On admission, patient was noticed to have heme-positive stools. He denied any recent NSAID use however is on chronic anticoagulation. INR was in low therapeutic range. GI was consulted and performed a capsule study on 2/11 that revealed occasional SB erosion likely due to ASA use. He can continue ASA and Coumadin daily per GI however will require CBC checks weekly and Ferritin every 2 weeks. The exact etiology of his GI bleed is unclear.   1. Paroxysmal atrial flutter. Currently in sinus rhythm. INR is trending down. He can resume Coumadin as an outpatient.   2. Aortic stenosis. Patient has been referred to cardiothoracic surgery for further evaluation 3. COPD, stable. Continue bronchodilators. 4. Hypertension, stable. Continue outpatient medicine 5. Hyperlipidemia. Continue statin  Consultants:  GI  Procedures:  PRBCs transfusion 2U 2/9  Capsule study 2/11- occasional SB erosion likely due to ASA use.   Discharge Exam: Filed Vitals:   04/18/15 2252 04/19/15 0646  BP: 144/72 156/64  Pulse: 96 95  Temp: 98 F (36.7 C) 97.8 F (36.6 C)  Resp: 18 18     General: NAD, looks comfortable  Cardiovascular: RRR, S1, S2   Respiratory: clear bilaterally, No wheezing, rales or rhonchi  Abdomen: soft, non tender, no distention , bowel sounds normal  Musculoskeletal: No edema b/l  Discharge Instructions   Discharge Instructions    Diet - low sodium heart  healthy    Complete by:  As directed      Increase activity slowly    Complete by:  As directed           Current Discharge Medication List    CONTINUE these medications which have NOT CHANGED   Details  albuterol (PROVENTIL) (2.5 MG/3ML) 0.083% nebulizer solution Take 3 mLs (2.5 mg total) by nebulization every 4 (four) hours as needed for wheezing or shortness of  breath. Qty: 75 mL, Refills: 12    allopurinol (ZYLOPRIM) 100 MG tablet Take 1 tablet by mouth daily.    aspirin 81 MG chewable tablet Chew 1 tablet (81 mg total) by mouth daily. Qty: 30 tablet, Refills: 0    carvedilol (COREG) 6.25 MG tablet Take 1 tablet (6.25 mg total) by mouth 2 (two) times daily with a meal. Qty: 180 tablet, Refills: 3    colchicine 0.6 MG tablet Take 1 tablet (0.6 mg total) by mouth daily. Qty: 20 tablet, Refills: 0    ferrous sulfate 325 (65 FE) MG tablet Take 325 mg by mouth at bedtime.     Fluticasone-Salmeterol (ADVAIR DISKUS) 250-50 MCG/DOSE AEPB Inhale 1 puff into the lungs daily. Qty: 60 each, Refills: 1    furosemide (LASIX) 40 MG tablet Take 1 tablet (40 mg total) by mouth daily as needed for fluid or edema. Qty: 30 tablet, Refills: 0    gabapentin (NEURONTIN) 300 MG capsule Take 300 mg by mouth 3 (three) times daily as needed (for pain/neuropathy).     pantoprazole (PROTONIX) 40 MG tablet Take 1 tablet (40 mg total) by mouth daily. Qty: 60 tablet, Refills: 5    pravastatin (PRAVACHOL) 40 MG tablet Take 1 tablet by mouth at bedtime.     tiotropium (SPIRIVA HANDIHALER) 18 MCG inhalation capsule Place 1 capsule (18 mcg total) into inhaler and inhale daily. Qty: 30 capsule, Refills: 12    VENTOLIN HFA 108 (90 BASE) MCG/ACT inhaler Inhale 1 puff into the lungs every 6 (six) hours as needed for wheezing or shortness of breath.     warfarin (COUMADIN) 1 MG tablet Take 3.5 tablets (3.5 mg total) by mouth daily at 6 PM. Qty: 60 tablet, Refills: 0    zolpidem (AMBIEN) 10 MG tablet Take 0.5 tablets (5 mg total) by mouth at bedtime as needed for sleep. Qty: 30 tablet, Refills: 0       No Known Allergies Follow-up Information    Follow up with [facility].   Specialty:  Emergency Medicine   Contact information:   [id number] [place] [phone]       The results of significant  diagnostics from this hospitalization (including imaging, microbiology, ancillary and laboratory) are listed below for reference.    Significant Diagnostic Studies: X-ray Chest Pa And Lateral  04/17/2015  CLINICAL DATA:  Sob increasingly worse over last few months, heme positive stools. Pt stated he was being treated by cardiologist who wants to schedule surgery as soon as breathing/stools clear up. History of asthma, chronic mild cough, COPD, prostate cancer, hypertension, aortic valve stenosis and cardiomyopathy. EXAM: CHEST  2 VIEW COMPARISON:  03/25/2015 FINDINGS: Cardiac silhouette is mildly enlarged. No mediastinal or hilar masses or evidence of adenopathy. Clear lungs.  No pleural effusion or pneumothorax. Bony thorax is demineralized but intact. IMPRESSION: No acute cardiopulmonary disease. Electronically Signed   By: Lajean Manes M.D.   On: 04/17/2015 20:51   Dg Chest 2 View  03/25/2015  CLINICAL DATA:  Shortness of breath and congestion this morning EXAM: CHEST  2 VIEW COMPARISON:  March 22, 2015 FINDINGS: The heart size and mediastinal contours are stable. There is no focal infiltrate, pulmonary edema, or pleural effusion. Degenerative joint changes of the spine are identified. IMPRESSION: No active cardiopulmonary disease. Electronically Signed   By: Abelardo Diesel M.D.   On: 03/25/2015 10:44   US Renal  04/16/2015  CLINICAL DATA:  70 year old male with stage III chronic renal disease. Hyperuricemia. Previous prostatectomy. Initial encounter. EXAM: RENAL / URINARY TRACT ULTRASOUND COMPLETE COMPARISON:  None. FINDINGS: Right Kidney: Length: 10.9 cm. Generalized renal cortical thinning. Echogenicity within normal limits. No mass or hydronephrosis visualized. Left Kidney: Length: 12.5 cm. Generalized renal cortical thinning. Superimposed 7 cm simple midpole exophytic cyst (image 45). Echogenicity within normal limits. No mass or hydronephrosis visualized. Bladder: Diminutive bladder, within normal  limits. IMPRESSION: No acute findings. Bilateral renal cortical atrophy. 7 cm simple left renal cyst. Electronically Signed   By: Genevie Ann M.D.   On: 04/16/2015 13:58   Dg Chest Portable 1 View  03/22/2015  CLINICAL DATA:  Short of breath.  Tachycardia. EXAM: PORTABLE CHEST 1 VIEW COMPARISON:  03/02/2015. FINDINGS: Cardiac silhouette is normal in size. No mediastinal or hilar masses or convincing adenopathy. Study is degraded by motion. There is irregular bronchial wall thickening most evident in the right lung base. This may be chronic accentuated by motion. Acute bronchitis should be considered likely in the proper clinical setting. There is no lung consolidation to suggest pneumonia and no evidence of pulmonary edema. No pleural effusion or pneumothorax. The bony thorax is grossly intact. IMPRESSION: 1. No evidence of pneumonia or pulmonary edema. 2. Bronchial wall thickening to the right lung base. This may reflect acute bronchitis. Electronically Signed   By: Lajean Manes M.D.   On: 03/22/2015 08:07    Labs: Basic Metabolic Panel:  Recent Labs Lab 04/16/15 1418 04/17/15 1112 04/18/15 0603  NA 140 141 140  K 3.8 3.6 3.8  CL 100* 102 103  CO2 31 33* 31  GLUCOSE 121* 123* 104*  BUN 21* 18 13  CREATININE 1.35* 1.13 1.04  CALCIUM 8.6* 8.4* 8.5*   Liver Function Tests:  Recent Labs Lab 04/17/15 1112  AST 19  ALT 15*  ALKPHOS 73  BILITOT 0.5  PROT 6.4*  ALBUMIN 2.8*   No results for input(s): LIPASE, AMYLASE in the last 168 hours. No results for input(s): AMMONIA in the last 168 hours. CBC:  Recent Labs Lab 04/16/15 1418 04/17/15 1112 04/18/15 0603 04/19/15 0640  WBC 11.2* 8.6 9.8 8.5  NEUTROABS  --  6.7  --   --   HGB 6.3* 6.2* 8.3* 8.9*  HCT 20.3* 20.4* 26.1* 27.8*  MCV 97.6 99.0 94.9 93.6  PLT 329 350 351 375    Signed: Kelvyn Schunk. MD [facility] 04/19/2015, 1:27 PM   By signing my name below, I, Rosalie Doctor, attest that this documentation  has been prepared under the direction and in the presence of [facility]. MD Electronically Signed: Rosalie Doctor, Scribe. 04/19/2015  I, Dr. Kathie Dike, personally performed the services described in this documentaiton. All medical record entries made by the scribe were at my direction and in my presence. I have reviewed the chart and agree that the record reflects my personal performance and is accurate and complete  Kathie Dike, MD, 04/19/2015 1:27 PM

## 2015-04-21 ENCOUNTER — Encounter: Payer: Self-pay | Admitting: Gastroenterology

## 2015-04-21 ENCOUNTER — Encounter (HOSPITAL_COMMUNITY): Payer: Self-pay | Admitting: Gastroenterology

## 2015-04-21 ENCOUNTER — Inpatient Hospital Stay (HOSPITAL_COMMUNITY): Admission: RE | Admit: 2015-04-21 | Payer: Medicare PPO | Source: Ambulatory Visit

## 2015-04-21 ENCOUNTER — Other Ambulatory Visit: Payer: Self-pay

## 2015-04-21 DIAGNOSIS — D649 Anemia, unspecified: Secondary | ICD-10-CM

## 2015-04-21 NOTE — Telephone Encounter (Signed)
APPT MADE AND LETTER SENT  °

## 2015-04-21 NOTE — Telephone Encounter (Signed)
Pt is aware that the referral has been made to Hematology and to get the blood work done on 04/25/2015.

## 2015-04-21 NOTE — Telephone Encounter (Signed)
Referral sent and labs faxed to [facility].  Called pt and LMOM

## 2015-04-25 ENCOUNTER — Telehealth: Payer: Self-pay | Admitting: Adult Health

## 2015-04-25 NOTE — Telephone Encounter (Signed)
Pt is needing to have his procedure r/s, he was in the hospital and missed it. Daughter requests it be on Monday

## 2015-04-28 LAB — PROTIME-INR: INR: 1.2 — AB (ref 0.9–1.1)

## 2015-04-30 ENCOUNTER — Encounter (HOSPITAL_COMMUNITY): Payer: Self-pay | Admitting: Oncology

## 2015-04-30 ENCOUNTER — Encounter (HOSPITAL_COMMUNITY): Payer: Medicare PPO | Attending: Oncology | Admitting: Oncology

## 2015-04-30 VITALS — BP 112/54 | HR 94 | Temp 97.7°F | Resp 20 | Ht 66.0 in | Wt 244.0 lb

## 2015-04-30 DIAGNOSIS — I129 Hypertensive chronic kidney disease with stage 1 through stage 4 chronic kidney disease, or unspecified chronic kidney disease: Secondary | ICD-10-CM | POA: Diagnosis not present

## 2015-04-30 DIAGNOSIS — Z9889 Other specified postprocedural states: Secondary | ICD-10-CM | POA: Diagnosis not present

## 2015-04-30 DIAGNOSIS — D500 Iron deficiency anemia secondary to blood loss (chronic): Secondary | ICD-10-CM

## 2015-04-30 DIAGNOSIS — Z79899 Other long term (current) drug therapy: Secondary | ICD-10-CM | POA: Diagnosis not present

## 2015-04-30 DIAGNOSIS — J45909 Unspecified asthma, uncomplicated: Secondary | ICD-10-CM | POA: Insufficient documentation

## 2015-04-30 DIAGNOSIS — I480 Paroxysmal atrial fibrillation: Secondary | ICD-10-CM | POA: Diagnosis not present

## 2015-04-30 DIAGNOSIS — Z7901 Long term (current) use of anticoagulants: Secondary | ICD-10-CM | POA: Insufficient documentation

## 2015-04-30 DIAGNOSIS — K922 Gastrointestinal hemorrhage, unspecified: Secondary | ICD-10-CM

## 2015-04-30 DIAGNOSIS — E7800 Pure hypercholesterolemia, unspecified: Secondary | ICD-10-CM | POA: Diagnosis not present

## 2015-04-30 DIAGNOSIS — K625 Hemorrhage of anus and rectum: Secondary | ICD-10-CM | POA: Diagnosis not present

## 2015-04-30 DIAGNOSIS — N183 Chronic kidney disease, stage 3 (moderate): Secondary | ICD-10-CM | POA: Insufficient documentation

## 2015-04-30 DIAGNOSIS — Z87891 Personal history of nicotine dependence: Secondary | ICD-10-CM | POA: Diagnosis not present

## 2015-04-30 DIAGNOSIS — I4891 Unspecified atrial fibrillation: Secondary | ICD-10-CM

## 2015-04-30 DIAGNOSIS — D649 Anemia, unspecified: Secondary | ICD-10-CM | POA: Diagnosis not present

## 2015-04-30 DIAGNOSIS — I429 Cardiomyopathy, unspecified: Secondary | ICD-10-CM | POA: Diagnosis not present

## 2015-04-30 DIAGNOSIS — Z8546 Personal history of malignant neoplasm of prostate: Secondary | ICD-10-CM | POA: Diagnosis not present

## 2015-04-30 DIAGNOSIS — Z8249 Family history of ischemic heart disease and other diseases of the circulatory system: Secondary | ICD-10-CM

## 2015-04-30 DIAGNOSIS — F1021 Alcohol dependence, in remission: Secondary | ICD-10-CM

## 2015-04-30 DIAGNOSIS — J449 Chronic obstructive pulmonary disease, unspecified: Secondary | ICD-10-CM | POA: Diagnosis not present

## 2015-04-30 DIAGNOSIS — M109 Gout, unspecified: Secondary | ICD-10-CM | POA: Diagnosis not present

## 2015-04-30 DIAGNOSIS — I350 Nonrheumatic aortic (valve) stenosis: Secondary | ICD-10-CM | POA: Diagnosis not present

## 2015-04-30 DIAGNOSIS — Z7982 Long term (current) use of aspirin: Secondary | ICD-10-CM | POA: Diagnosis not present

## 2015-04-30 DIAGNOSIS — K921 Melena: Secondary | ICD-10-CM

## 2015-04-30 DIAGNOSIS — Z8042 Family history of malignant neoplasm of prostate: Secondary | ICD-10-CM

## 2015-04-30 LAB — CBC
HCT: 30.3 % — ABNORMAL LOW (ref 39.0–52.0)
Hemoglobin: 9.2 g/dL — ABNORMAL LOW (ref 13.0–17.0)
MCH: 29 pg (ref 26.0–34.0)
MCHC: 30.4 g/dL (ref 30.0–36.0)
MCV: 95.6 fL (ref 78.0–100.0)
PLATELETS: 473 10*3/uL — AB (ref 150–400)
RBC: 3.17 MIL/uL — ABNORMAL LOW (ref 4.22–5.81)
RDW: 14.3 % (ref 11.5–15.5)
WBC: 13.8 10*3/uL — ABNORMAL HIGH (ref 4.0–10.5)

## 2015-04-30 LAB — IRON AND TIBC
Iron: 21 ug/dL — ABNORMAL LOW (ref 45–182)
SATURATION RATIOS: 6 % — AB (ref 17.9–39.5)
TIBC: 326 ug/dL (ref 250–450)
UIBC: 305 ug/dL

## 2015-04-30 LAB — RETICULOCYTES
RBC.: 3.17 MIL/uL — AB (ref 4.22–5.81)
RETIC COUNT ABSOLUTE: 88.8 10*3/uL (ref 19.0–186.0)
RETIC CT PCT: 2.8 % (ref 0.4–3.1)

## 2015-04-30 LAB — PROTIME-INR
INR: 1.13 (ref 0.00–1.49)
Prothrombin Time: 14.7 seconds (ref 11.6–15.2)

## 2015-04-30 LAB — FERRITIN: FERRITIN: 49 ng/mL (ref 24–336)

## 2015-04-30 LAB — SEDIMENTATION RATE: Sed Rate: 128 mm/hr — ABNORMAL HIGH (ref 0–16)

## 2015-04-30 LAB — LACTATE DEHYDROGENASE: LDH: 156 U/L (ref 98–192)

## 2015-04-30 LAB — TSH: TSH: 0.156 u[IU]/mL — AB (ref 0.350–4.500)

## 2015-04-30 LAB — C-REACTIVE PROTEIN: CRP: 8.4 mg/dL — ABNORMAL HIGH (ref <1.0)

## 2015-04-30 NOTE — Assessment & Plan Note (Addendum)
Normocytic, normochromic anemia with preservation of WBC and platelet count with history of GI blood loss in the setting of vitamin K antagonist anticoagulation and antiplatelet therapy with ASA for atrial fibrillation requiring PRBC transfusions x 6; S/P complete GI work-up including EGD by Dr. Oneida Alar on 05/31/2014 showing 2 duodenal ulcers, mild erosive gastritis, and H. Pylori, colonoscopy by Dr. Laural Golden on 08/25/2014 showing a diverticular bleed and hemorrhoidal bleed, and a pill cam on 04/19/2015 by Dr. Oneida Alar revealing occasional small bowel erosions.  Another factor that may be playing a part in this patient's anemia is mechanical damage to RBCs from severe aortic stenosis that is followed by cardiology.  Labs today: CBC differential, LDH, ESR, CRP, iron/TIBC, ferritin, haptoglobin, reticulocyte count, erythropoietin level, SPEP plus IFE, light chain assay, occult stool cards 3, free/total testosterone, TSH, PT/INR, and peripheral smear review by pathologist.  He is to discontinue his PO iron, in light of upcoming stool card testing.  Iron deficit calculation using the goal of a hemoglobin of 13 g/dL results in a deficit of approximately 1000 mg of iron.  As a result, we will get him set-up for 1 dose of Injectafer.  Given his past EtOHism, I will order an [place] of liver to evaluate for cirrhosis of liver, as this can participate in anemia as well.  Return in 2-3 weeks for follow-up.

## 2015-04-30 NOTE — Progress Notes (Signed)
Ryan Shea's reason for visit today are for labs as scheduled per MD orders.  Venipuncture performed with a 23 gauge butterfly needle to [facility] tolerated venipuncture well and without incident; questions were answered and patient was discharged.

## 2015-04-30 NOTE — Patient Instructions (Addendum)
[place] at [facility] Discharge Instructions  RECOMMENDATIONS MADE BY THE CONSULTANT AND ANY TEST RESULTS WILL BE SENT TO YOUR REFERRING PHYSICIAN.  Exam and discussion today with Kirby Crigler, PA and Dr. Whitney Muse. Lab work today You can stop taking your iron pills  Injectafer 1 dose  Ultrasound of your liver Return in 2-3 weeks for follow-up Please call the clinic if you have any questions or concerns    Thank you for choosing [facility] at [facility] to provide your oncology and hematology care.  To afford each patient quality time with our provider, please arrive at least 15 minutes before your scheduled appointment time.   Beginning January 23rd 2017 lab work for the [facility] will be done in the  Main lab at [facility] on 1st floor. If you have a lab appointment with the [facility] please come in thru the  Main Entrance and check in at the main information desk  You need to re-schedule your appointment should you arrive 10 or more minutes late.  We strive to give you quality time with our providers, and arriving late affects you and other patients whose appointments are after yours.  Also, if you no show three or more times for appointments you may be dismissed from the clinic at the providers discretion.     Again, thank you for choosing [facility].  Our hope is that these requests will decrease the amount of time that you wait before being seen by our physicians.       _____________________________________________________________  Should you have questions after your visit to [facility], please contact our office at [phone] between the hours of 8:30 a.m. and 4:30 p.m.  Voicemails left after 4:30 p.m. will not be returned until the following business day.  For prescription refill requests, have your pharmacy contact our office.

## 2015-04-30 NOTE — Progress Notes (Signed)
[facility] Consultation   Name: Ryan Shea      MRN: [id number]  Date: 04/30/2015 Time:1:19 PM   REFERRING PHYSICIAN:  Barney Drain, MD (GI)  REASON FOR CONSULT: Normocytic, transfusion dependent anemia   DIAGNOSIS:  Normocytic, normochromic anemia with preservation of WBC and platelet count on vitamin K antagonist.  HISTORY OF PRESENT ILLNESS:   Ryan Shea is a 70 year old white American with a past medical history significant for rectal bleeding, paroxysmal atrial fibrillation on chronic vitamin K antagonists anticoagulation, severe aortic stenosis, NSTEMI, multiple duodenal ulcers, morbid obesity, hypertension, high cholesterol, COPD, H/O chronic kidney disease stage III, and cardiomyopathy who is referred to the [facility] for evaluation of normocytic anemia requiring transfusions.  I personally reviewed and went over laboratory results with the patient.  The results are noted within this dictation.  Most recent CBC from 04/19/2015 demonstrates a normal white blood cell count, normal platelet count, normocytic, normochromic anemia. He status post an anemia panel in 04/18/2015 shows a low-normal vitamin B12 level at 298, folate at 21.1, TIBC of 337, low iron saturation of 5%, low serum iron at 17, and a ferritin of 34. Reticulocyte count show a reticulocytosis at 13.3%.  His anemia dates back to at least March 2016 at which point time he had a hemoglobin of 4.3  I personally reviewed and went over radiographic studies with the patient.  The results are noted within this dictation.    Chart reviewed. He's been admitted to the [facility] number occasions for acute GI blood loss. He is status post upper endoscopy by Ryan Drain, MD (GI) on 05/31/2014 demonstrating 2 duodenal ulcers in the setting of anticoagulation in addition to mild nonerosive gastritis. Stomach biopsies demonstrate H. pylori gastritis.  It appears as though the patient's  received 6 units of packed red blood cells over the past 12 months.  He received 4 units of blood on his first hospitalization in March 2016 and then the patient reports that he received 2 units with his most recent hospitalization. He also knows that he received 2 units of fresh frozen plasma.  He is S/P a camera capsule study on 04/18/2014 by Ryan Shea as well.  This demonstrated occasional small bowel erosions due to ASA use without any active bleeding.  On 08/25/2014, the patient underwent a colonoscopy by Ryan [facility]a showing hemorrhoidal/diverticular bleed.  She reports that in March 2016 he noted dark, tarry, sticky, black stools. He didn't think too much of this symptom until he heard someone else discussing dark stools being a sign of blood in stool. As a result, he called EMS and was transported to [facility] for further evaluation. He reports at that time he was on anticoagulation with Coumadin for atrial fibrillation.    He denies any blood in his stool at this time. He denies any dark sticky stools. He admits to repeat an black stools with each hospitalization. He denies any chest pain, abdominal pain, B symptoms, weight loss, change in appetite, hematuria, pica, and phagophagia.  He notes his breathing is at baseline.  In addition to Coumadin anticoagulation, he is also on aspirin. He also takes ferrous sulfate 325 mg at bedtime. He is tolerating oral iron without any complaints.   PAST MEDICAL HISTORY:   Past Medical History  Diagnosis Date  . Asthma   . Hypertension   . COPD (chronic obstructive pulmonary disease) ([place])   . Cancer ([place])  Prostate   . Helicobacter pylori gastritis MAR 2016  . Multiple duodenal ulcers MAR 2016  . Atrial flutter ([place])   . Aortic valve stenosis   . Cardiomyopathy ([place])   . Mitral regurgitation   . Gout     ALLERGIES: No Known Allergies    MEDICATIONS: I have reviewed the patient's current medications.    Current Outpatient  Prescriptions on File Prior to Visit  Medication Sig Dispense Refill  . albuterol (PROVENTIL) (2.5 MG/3ML) 0.083% nebulizer solution Take 3 mLs (2.5 mg total) by nebulization every 4 (four) hours as needed for wheezing or shortness of breath. 75 mL 12  . allopurinol (ZYLOPRIM) 100 MG tablet Take 1 tablet by mouth daily.    Marland Kitchen aspirin 81 MG chewable tablet Chew 1 tablet (81 mg total) by mouth daily. 30 tablet 0  . carvedilol (COREG) 6.25 MG tablet Take 1 tablet (6.25 mg total) by mouth 2 (two) times daily with a meal. 180 tablet 3  . colchicine 0.6 MG tablet Take 1 tablet (0.6 mg total) by mouth daily. 20 tablet 0  . ferrous sulfate 325 (65 FE) MG tablet Take 325 mg by mouth at bedtime.     . Fluticasone-Salmeterol (ADVAIR DISKUS) 250-50 MCG/DOSE AEPB Inhale 1 puff into the lungs daily. 60 each 1  . furosemide (LASIX) 40 MG tablet Take 1 tablet (40 mg total) by mouth daily as needed for fluid or edema. 30 tablet 0  . gabapentin (NEURONTIN) 300 MG capsule Take 300 mg by mouth 3 (three) times daily as needed (for pain/neuropathy).     . pantoprazole (PROTONIX) 40 MG tablet Take 1 tablet (40 mg total) by mouth daily. 60 tablet 5  . pravastatin (PRAVACHOL) 40 MG tablet Take 1 tablet by mouth at bedtime.     Marland Kitchen tiotropium (SPIRIVA HANDIHALER) 18 MCG inhalation capsule Place 1 capsule (18 mcg total) into inhaler and inhale daily. 30 capsule 12  . VENTOLIN HFA 108 (90 BASE) MCG/ACT inhaler Inhale 1 puff into the lungs every 6 (six) hours as needed for wheezing or shortness of breath.     . warfarin (COUMADIN) 1 MG tablet Take 3.5 tablets (3.5 mg total) by mouth daily at 6 PM. (Patient taking differently: Take 2.5 mg by mouth daily at 6 PM. HOLD CRITICAL LOW HGB 04/17/15) 60 tablet 0  . zolpidem (AMBIEN) 10 MG tablet Take 0.5 tablets (5 mg total) by mouth at bedtime as needed for sleep. 30 tablet 0   No current facility-administered medications on file prior to visit.     PAST SURGICAL HISTORY Past Surgical  History  Procedure Laterality Date  . Prostate surgery    . Hernia repair    . Esophagogastroduodenoscopy N/A 05/31/2014    H PYLORI GASTRITIS, DUODENAL ULCERS  . Colonoscopy    . Colonoscopy N/A 08/25/2014    Procedure: COLONOSCOPY;  Surgeon: Rogene Houston, MD;  [facility];  Service: Endoscopy;  Laterality: N/A;  . Cardiac catheterization N/A 03/26/2015    Procedure: Right/Left Heart Cath and Coronary Angiography;  Surgeon: Burnell Blanks, MD;  Location: [facility];  Service: Cardiovascular;  Laterality: N/A;  . Givens capsule study N/A 04/18/2015    Procedure: GIVENS CAPSULE STUDY;  Surgeon: Danie Binder, MD;  [facility];  Service: Endoscopy;  Laterality: N/A;    FAMILY HISTORY: Family History  Problem Relation Age of Onset  . Stroke Mother   . Cancer Brother   . Cancer Other   .  Colon cancer Neg Hx   . Colon polyps Neg Hx   Mother is deceased at the age of 32 from recurrent strokes Father deceased in his 6s from heart disease His one brother who is deceased from prostate cancer. He passed away in his 42s. He has 2 brothers who are still living, [name] who has diabetes and a history of prostate cancer and Eddie Dibbles who also has diabetes. His 2 sisters who are living, Joycelyn Schmid and [place]. Joycelyn Schmid has Alzheimer's. He has 3 children, 2 daughters and 1 son. Daughters ages are 32 and 65. Both are healthy. His son is 37 years old and healthy. His 8 grandchildren and 5 great-grandchildren. All healthy.  SOCIAL HISTORY:  reports that he quit smoking about 36 years ago. His smoking use included Cigarettes. He started smoking about 55 years ago. He has a 40 pack-year smoking history. He has quit using smokeless tobacco. He reports that he uses illicit drugs (Methaqualone). He reports that he does not drink alcohol.  Pubis to the history of drinking alcohol quitting about 8 years ago taking up to a half a case per day. He's been married for 50 years. He  does have a previous divorce. 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  PERFORMANCE STATUS: The patient's performance status is 1 - Symptomatic but completely ambulatory  PHYSICAL EXAM: Most Recent Vital Signs: Blood pressure 112/54, pulse 94, temperature 97.7 F (36.5 C), temperature source Oral, resp. rate 20, height 5' 6" (1.676 m), weight 244 lb (110.678 kg), SpO2 95 %. General appearance: alert, cooperative, appears stated age, no distress, morbidly obese and Accompanied by his wife Head: Normocephalic, without obvious abnormality, atraumatic Eyes: negative findings: lids and lashes normal, conjunctivae and sclerae normal and corneas clear Throat: normal findings: lips normal without lesions, buccal mucosa normal, tongue midline and normal and oropharynx pink & moist without lesions or evidence of thrush and abnormal findings: dentition: upper and lower dentures Neck: no adenopathy, supple, symmetrical, trachea midline, thyroid not enlarged, symmetric, no tenderness/mass/nodules and Thick neck Lungs: diminished breath sounds bilaterally and wheezes apex - bilateral Heart: regular rate and rhythm, S1, S2 normal and systolic murmur: systolic ejection 2/6, blowing at 2nd right intercostal space Abdomen: normal findings: bowel sounds normal, liver span normal to percussion, soft, non-tender and spleen non-palpable and abnormal findings:  obese Extremities: no edema, redness or tenderness in the calves or thighs Skin: Skin color, texture, turgor normal. No rashes or lesions Lymph nodes: Cervical, supraclavicular, and axillary nodes normal. Neurologic: Alert and oriented X 3, normal strength and tone. Normal symmetric reflexes. Normal coordination and gait  LABORATORY DATA:  CBC    Component Value Date/Time   WBC 8.5 04/19/2015 0640   RBC 2.97* 04/19/2015 0640   RBC 2.06* 04/17/2015 1115   HGB 8.9* 04/19/2015 0640   HCT 27.8* 04/19/2015 0640   PLT 375  04/19/2015 0640   MCV 93.6 04/19/2015 0640   MCH 30.0 04/19/2015 0640   MCHC 32.0 04/19/2015 0640   RDW 17.3* 04/19/2015 0640   LYMPHSABS 1.0 04/17/2015 1112   MONOABS 0.7 04/17/2015 1112   EOSABS 0.1 04/17/2015 1112   BASOSABS 0.0 04/17/2015 1112      Chemistry      Component Value Date/Time   NA 140 04/18/2015 0603   K 3.8 04/18/2015 0603   CL 103 04/18/2015 0603   CO2 31 04/18/2015 0603   BUN 13 04/18/2015 0603   CREATININE 1.04 04/18/2015 0603  Component Value Date/Time   CALCIUM 8.5* 04/18/2015 0603   ALKPHOS 73 04/17/2015 1112   AST 19 04/17/2015 1112   ALT 15* 04/17/2015 1112   BILITOT 0.5 04/17/2015 1112     Lab Results  Component Value Date   IRON 17* 04/18/2015   TIBC 337 04/18/2015   FERRITIN 34 04/18/2015   Lab Results  Component Value Date   VITAMINB12 298 04/18/2015   Lab Results  Component Value Date   FOLATE 21.1 04/18/2015     RADIOGRAPHY: No results found.     PATHOLOGY:    Stomach, biopsy - HELICOBACTER PYLORI GASTRITIS. - NO EVIDENCE OF INTESTINAL METAPLASIA, DYSPLASIA, OR MALIGNANCY. - SEE COMMENT. Microscopic Comment A Warthin-Starry stain is performed to determine the possibility of the presence of Helicobacter pylori. Organisms of Helicobacter pylori are identified on the Warthin-Starry stain. (HCL:ds 06/04/14) Aldona Bar MD Pathologist, Electronic Signature (Case signed 06/04/2014)  ASSESSMENT/PLAN:   Anemia Chronic anticoagulation Transfusion requirement GI bleed H. Pylori  Normocytic, normochromic anemia with preservation of WBC and platelet count with history of GI blood loss in the setting of vitamin K antagonist anticoagulation and antiplatelet therapy with ASA for atrial fibrillation requiring PRBC transfusions x 6; S/P complete GI work-up including EGD by Ryan [facility] on 05/31/2014 showing 2 duodenal ulcers, mild erosive gastritis, and H. Pylori, colonoscopy by Ryan [facility] on 08/25/2014 showing a diverticular  bleed and hemorrhoidal bleed, and a pill cam on 04/19/2015 by Ryan [facility] revealing occasional small bowel erosions.  Another factor that may be playing a part in this patient's anemia is mechanical damage to RBCs from severe aortic stenosis that is followed by cardiology.  Labs today: CBC differential, LDH, ESR, CRP, iron/TIBC, ferritin, haptoglobin, reticulocyte count, erythropoietin level, SPEP plus IFE, light chain assay, occult stool cards 3, free/total testosterone, TSH, PT/INR, and peripheral smear review by pathologist.  Iron deficit calculation using the goal of a hemoglobin of 13 g/dL results in a deficit of approximately 1000 mg of iron.  As a result, we will get him set-up for 1 dose of Injectafer.  Given his past EtOHism, I will order an [place] of liver to evaluate for cirrhosis of liver.  Return in 2-3 weeks for follow-up.  All questions were answered. The patient knows to call the clinic with any problems, questions or concerns. We can certainly see the patient much sooner if necessary.  This note is electronically signed by: Molli Hazard, MD   04/30/2015 1:19 PM

## 2015-05-01 LAB — IGG, IGA, IGM
IGA: 217 mg/dL (ref 61–437)
IgG (Immunoglobin G), Serum: 913 mg/dL (ref 700–1600)
IgM, Serum: 84 mg/dL (ref 20–172)

## 2015-05-01 LAB — TESTOSTERONE: TESTOSTERONE: 177 ng/dL — AB (ref 348–1197)

## 2015-05-01 LAB — TESTOSTERONE, FREE: TESTOSTERONE FREE: 3.2 pg/mL — AB (ref 6.6–18.1)

## 2015-05-01 LAB — PROTEIN ELECTROPHORESIS, SERUM
A/G RATIO SPE: 0.8 (ref 0.7–1.7)
ALBUMIN ELP: 2.7 g/dL — AB (ref 2.9–4.4)
ALPHA-1-GLOBULIN: 0.3 g/dL (ref 0.0–0.4)
ALPHA-2-GLOBULIN: 1 g/dL (ref 0.4–1.0)
BETA GLOBULIN: 1.1 g/dL (ref 0.7–1.3)
Gamma Globulin: 0.8 g/dL (ref 0.4–1.8)
Globulin, Total: 3.2 g/dL (ref 2.2–3.9)
Total Protein ELP: 5.9 g/dL — ABNORMAL LOW (ref 6.0–8.5)

## 2015-05-01 LAB — HAPTOGLOBIN: Haptoglobin: 357 mg/dL — ABNORMAL HIGH (ref 34–200)

## 2015-05-01 LAB — KAPPA/LAMBDA LIGHT CHAINS
KAPPA FREE LGHT CHN: 55.01 mg/L — AB (ref 3.30–19.40)
Kappa, lambda light chain ratio: 1.15 (ref 0.26–1.65)
LAMDA FREE LIGHT CHAINS: 47.72 mg/L — AB (ref 5.71–26.30)

## 2015-05-01 LAB — PATHOLOGIST SMEAR REVIEW

## 2015-05-01 LAB — ERYTHROPOIETIN: Erythropoietin: 34.2 m[IU]/mL — ABNORMAL HIGH (ref 2.6–18.5)

## 2015-05-02 ENCOUNTER — Telehealth (HOSPITAL_COMMUNITY): Payer: Self-pay | Admitting: Hematology & Oncology

## 2015-05-02 LAB — IMMUNOFIXATION ELECTROPHORESIS
IGM, SERUM: 81 mg/dL (ref 20–172)
IgA: 232 mg/dL (ref 61–437)
IgG (Immunoglobin G), Serum: 925 mg/dL (ref 700–1600)
Total Protein ELP: 6 g/dL (ref 6.0–8.5)

## 2015-05-02 NOTE — Telephone Encounter (Signed)
AUTH NOT REQUIRED FOR [id number] [facility] [phone]

## 2015-05-05 LAB — PROTIME-INR: INR: 1.4 — AB (ref 0.9–1.1)

## 2015-05-06 ENCOUNTER — Encounter (HOSPITAL_BASED_OUTPATIENT_CLINIC_OR_DEPARTMENT_OTHER): Payer: Medicare PPO

## 2015-05-06 ENCOUNTER — Encounter (HOSPITAL_COMMUNITY): Payer: Self-pay

## 2015-05-06 DIAGNOSIS — D649 Anemia, unspecified: Secondary | ICD-10-CM | POA: Diagnosis not present

## 2015-05-06 LAB — TESTOSTERONE, % FREE: TESTOSTERONE-% FREE: 1 % — AB (ref 0.2–0.7)

## 2015-05-06 MED ORDER — SODIUM CHLORIDE 0.9 % IV SOLN
750.0000 mg | Freq: Once | INTRAVENOUS | Status: AC
  Administered 2015-05-06: 750 mg via INTRAVENOUS
  Filled 2015-05-06: qty 15

## 2015-05-06 MED ORDER — SODIUM CHLORIDE 0.9% FLUSH: 10.0000 mL | Freq: Once | INTRAVENOUS | Status: DC

## 2015-05-06 MED ORDER — SODIUM CHLORIDE 0.9 % IV SOLN
Freq: Once | INTRAVENOUS | Status: AC
  Administered 2015-05-06: 15:00:00 via INTRAVENOUS

## 2015-05-06 NOTE — Progress Notes (Signed)
Tolerated well

## 2015-05-06 NOTE — Patient Instructions (Signed)
injectafer todayFerric carboxymaltose injection What is this medicine? FERRIC CARBOXYMALTOSE (ferr-ik car-box-ee-mol-toes) is an iron complex. Iron is used to make healthy red blood cells, which carry oxygen and nutrients throughout the body. This medicine is used to treat anemia in people with chronic kidney disease or people who cannot take iron by mouth. This medicine may be used for other purposes; ask your health care provider or pharmacist if you have questions. What should I tell my health care provider before I take this medicine? They need to know if you have any of these conditions: -anemia not caused by low iron levels -high levels of iron in the blood -liver disease -an unusual or allergic reaction to iron, other medicines, foods, dyes, or preservatives -pregnant or trying to get pregnant -breast-feeding How should I use this medicine? This medicine is for infusion into a vein. It is given by a health care professional in a hospital or clinic setting. Talk to your pediatrician regarding the use of this medicine in children. Special care may be needed. Overdosage: If you think you have taken too much of this medicine contact a poison control center or emergency room at once. NOTE: This medicine is only for you. Do not share this medicine with others. What if I miss a dose? It is important not to miss your dose. Call your doctor or health care professional if you are unable to keep an appointment. What may interact with this medicine? Do not take this medicine with any of the following medications: -deferoxamine -dimercaprol -other iron products This medicine may also interact with the following medications: -chloramphenicol -deferasirox This list may not describe all possible interactions. Give your health care provider a list of all the medicines, herbs, non-prescription drugs, or dietary supplements you use. Also tell them if you smoke, drink alcohol, or use illegal drugs.  Some items may interact with your medicine. What should I watch for while using this medicine? Visit your doctor or health care professional regularly. Tell your doctor if your symptoms do not start to get better or if they get worse. You may need blood work done while you are taking this medicine. You may need to follow a special diet. Talk to your doctor. Foods that contain iron include: whole grains/cereals, dried fruits, beans, or peas, leafy green vegetables, and organ meats (liver, kidney). What side effects may I notice from receiving this medicine? Side effects that you should report to your doctor or health care professional as soon as possible: -allergic reactions like skin rash, itching or hives, swelling of the face, lips, or tongue -breathing problems -changes in blood pressure -feeling faint or lightheaded, falls -flushing, sweating, or hot feelings Side effects that usually do not require medical attention (Report these to your doctor or health care professional if they continue or are bothersome.): -changes in taste -constipation -dizziness -headache -nausea -pain, redness, or irritation at site where injected -vomiting This list may not describe all possible side effects. Call your doctor for medical advice about side effects. You may report side effects to FDA at 1-800-FDA-1088. Where should I keep my medicine? This drug is given in a hospital or clinic and will not be stored at home. NOTE: This sheet is a summary. It may not cover all possible information. If you have questions about this medicine, talk to your doctor, pharmacist, or health care provider.    2016, Elsevier/Gold Standard. (2011-10-08 15:36:34)

## 2015-05-07 ENCOUNTER — Ambulatory Visit (HOSPITAL_COMMUNITY)
Admission: RE | Admit: 2015-05-07 | Discharge: 2015-05-07 | Disposition: A | Discharge status: Home / Self Care | Payer: Medicare PPO | Source: Ambulatory Visit | Attending: Oncology | Admitting: Oncology

## 2015-05-07 ENCOUNTER — Other Ambulatory Visit (HOSPITAL_COMMUNITY): Payer: Self-pay | Admitting: *Deleted

## 2015-05-07 DIAGNOSIS — D500 Iron deficiency anemia secondary to blood loss (chronic): Secondary | ICD-10-CM | POA: Diagnosis present

## 2015-05-07 LAB — OCCULT BLOOD X 1 CARD TO LAB, STOOL
FECAL OCCULT BLD: NEGATIVE
FECAL OCCULT BLD: NEGATIVE
FECAL OCCULT BLD: POSITIVE — AB

## 2015-05-08 ENCOUNTER — Telehealth: Payer: Self-pay | Admitting: Gastroenterology

## 2015-05-08 ENCOUNTER — Encounter (INDEPENDENT_AMBULATORY_CARE_PROVIDER_SITE_OTHER): Payer: Medicare PPO | Admitting: Gastroenterology

## 2015-05-08 NOTE — Progress Notes (Signed)
   Subjective:    Patient ID: Ryan [facility], male    DOB: [birth year], 70 y.o.   MRN: [id number]    NO SHOW. NO CALL.  Shade Flood, MD  HPI   PT DENIES FEVER, CHILLS, HEMATOCHEZIA, HEMATEMESIS, nausea, vomiting, melena, diarrhea, CHEST PAIN, SHORTNESS OF BREATH,  CHANGE IN BOWEL IN HABITS, constipation, abdominal pain, problems swallowing, problems with sedation, heartburn or indigestion.  Past Medical History  Diagnosis Date  . Asthma   . Hypertension   . COPD (chronic obstructive pulmonary disease) ([place])   . Cancer [facility])     Prostate   . Helicobacter pylori gastritis MAR 2016  . Multiple duodenal ulcers MAR 2016  . Atrial flutter ([place])   . Aortic valve stenosis   . Cardiomyopathy ([place])   . Mitral regurgitation   . Gout     Past Surgical History  Procedure Laterality Date  . Prostate surgery    . Hernia repair    . Esophagogastroduodenoscopy N/A 05/31/2014    H PYLORI GASTRITIS, DUODENAL ULCERS  . Colonoscopy    . Colonoscopy N/A 08/25/2014    Rehman:examination performed to cecum. moderate number of diverticula at sigmoid colon without stigmata of bleed small external hemorrhoids and anal papillae  . Cardiac catheterization N/A 03/26/2015    Procedure: Right/Left Heart Cath and Coronary Angiography;  Surgeon: Burnell Blanks, MD;  Location: [facility];  Service: Cardiovascular;  Laterality: N/A;  . Givens capsule study N/A 04/18/2015    Procedure: GIVENS CAPSULE STUDY;  Surgeon: Danie Binder, MD;  [facility];  Service: Endoscopy;  Laterality: N/A;    No Known Allergies    Review of Systems PER HPI OTHERWISE ALL SYSTEMS ARE NEGATIVE.    Objective:   Physical Exam        Assessment & Plan:

## 2015-05-08 NOTE — Telephone Encounter (Signed)
Pt was a no show

## 2015-05-09 NOTE — Telephone Encounter (Signed)
REVIEWED-NO ADDITIONAL RECOMMENDATIONS. 

## 2015-05-12 ENCOUNTER — Telehealth: Payer: Self-pay | Admitting: Adult Health

## 2015-05-12 NOTE — Telephone Encounter (Signed)
Wife called they missed apt with Dr Gardenia Phlegm will call to reschedule

## 2015-05-12 NOTE — Telephone Encounter (Signed)
Please call this pt concerning the apt he missed w/ Dr. Cyndia Bent

## 2015-05-14 ENCOUNTER — Encounter (HOSPITAL_COMMUNITY): Payer: Self-pay | Admitting: Hematology & Oncology

## 2015-05-14 ENCOUNTER — Encounter (HOSPITAL_COMMUNITY): Payer: Medicare PPO | Attending: Oncology | Admitting: Hematology & Oncology

## 2015-05-14 VITALS — BP 163/74 | HR 93 | Temp 97.6°F | Resp 20 | Wt 242.6 lb

## 2015-05-14 DIAGNOSIS — Z87891 Personal history of nicotine dependence: Secondary | ICD-10-CM | POA: Insufficient documentation

## 2015-05-14 DIAGNOSIS — K922 Gastrointestinal hemorrhage, unspecified: Secondary | ICD-10-CM | POA: Diagnosis not present

## 2015-05-14 DIAGNOSIS — J449 Chronic obstructive pulmonary disease, unspecified: Secondary | ICD-10-CM | POA: Insufficient documentation

## 2015-05-14 DIAGNOSIS — R195 Other fecal abnormalities: Secondary | ICD-10-CM | POA: Diagnosis not present

## 2015-05-14 DIAGNOSIS — D500 Iron deficiency anemia secondary to blood loss (chronic): Secondary | ICD-10-CM

## 2015-05-14 DIAGNOSIS — K921 Melena: Secondary | ICD-10-CM

## 2015-05-14 DIAGNOSIS — I480 Paroxysmal atrial fibrillation: Secondary | ICD-10-CM | POA: Insufficient documentation

## 2015-05-14 DIAGNOSIS — Z8546 Personal history of malignant neoplasm of prostate: Secondary | ICD-10-CM | POA: Insufficient documentation

## 2015-05-14 DIAGNOSIS — D649 Anemia, unspecified: Secondary | ICD-10-CM | POA: Diagnosis not present

## 2015-05-14 DIAGNOSIS — Z79899 Other long term (current) drug therapy: Secondary | ICD-10-CM | POA: Insufficient documentation

## 2015-05-14 DIAGNOSIS — K625 Hemorrhage of anus and rectum: Secondary | ICD-10-CM | POA: Insufficient documentation

## 2015-05-14 DIAGNOSIS — Z7982 Long term (current) use of aspirin: Secondary | ICD-10-CM | POA: Insufficient documentation

## 2015-05-14 DIAGNOSIS — J45909 Unspecified asthma, uncomplicated: Secondary | ICD-10-CM | POA: Insufficient documentation

## 2015-05-14 DIAGNOSIS — Z9889 Other specified postprocedural states: Secondary | ICD-10-CM | POA: Insufficient documentation

## 2015-05-14 DIAGNOSIS — I350 Nonrheumatic aortic (valve) stenosis: Secondary | ICD-10-CM | POA: Insufficient documentation

## 2015-05-14 DIAGNOSIS — E611 Iron deficiency: Secondary | ICD-10-CM

## 2015-05-14 DIAGNOSIS — N183 Chronic kidney disease, stage 3 (moderate): Secondary | ICD-10-CM | POA: Insufficient documentation

## 2015-05-14 DIAGNOSIS — I429 Cardiomyopathy, unspecified: Secondary | ICD-10-CM | POA: Insufficient documentation

## 2015-05-14 DIAGNOSIS — E7800 Pure hypercholesterolemia, unspecified: Secondary | ICD-10-CM | POA: Insufficient documentation

## 2015-05-14 DIAGNOSIS — I129 Hypertensive chronic kidney disease with stage 1 through stage 4 chronic kidney disease, or unspecified chronic kidney disease: Secondary | ICD-10-CM | POA: Insufficient documentation

## 2015-05-14 DIAGNOSIS — Z7901 Long term (current) use of anticoagulants: Secondary | ICD-10-CM | POA: Insufficient documentation

## 2015-05-14 DIAGNOSIS — M109 Gout, unspecified: Secondary | ICD-10-CM | POA: Insufficient documentation

## 2015-05-14 MED ORDER — IRON POLYSACCH CMPLX-B12-FA 150-0.025-1 MG PO CAPS: 1.0000 | ORAL_CAPSULE | Freq: Two times a day (BID) | ORAL | Status: DC

## 2015-05-14 NOTE — Patient Instructions (Signed)
..  [place] at [facility] Discharge Instructions  RECOMMENDATIONS MADE BY THE CONSULTANT AND ANY TEST RESULTS WILL BE SENT TO YOUR REFERRING PHYSICIAN.  One of your stool cards showed blood We will see you back in 6 weeks   If your blood work is not better we will have to give you IV iron Also if you can not tolerate the iron by mouth we will give you IV iron Let us know if you need to have surgery  Thank you for choosing [place] at [facility] to provide your oncology and hematology care.  To afford each patient quality time with our provider, please arrive at least 15 minutes before your scheduled appointment time.   Beginning January 23rd 2017 lab work for the [facility] will be done in the  Main lab at [facility] on 1st floor. If you have a lab appointment with the [facility] please come in thru the  Main Entrance and check in at the main information desk  You need to re-schedule your appointment should you arrive 10 or more minutes late.  We strive to give you quality time with our providers, and arriving late affects you and other patients whose appointments are after yours.  Also, if you no show three or more times for appointments you may be dismissed from the clinic at the providers discretion.     Again, thank you for choosing [facility].  Our hope is that these requests will decrease the amount of time that you wait before being seen by our physicians.       _____________________________________________________________  Should you have questions after your visit to [facility], please contact our office at [phone] between the hours of 8:30 a.m. and 4:30 p.m.  Voicemails left after 4:30 p.m. will not be returned until the following business day.  For prescription refill requests, have your pharmacy contact our office.         Resources For Cancer Patients and their  Caregivers ? American Cancer Society: Can assist with transportation, wigs, general needs, runs Look Good Feel Better.        218-408-0528 ? Cancer Care: Provides financial assistance, online support groups, medication/co-pay assistance.  1-800-813-HOPE 813-714-5160) ? [facility] Assists [place] cancer patients and their families through emotional , educational and financial support.  228-721-1603 ? [place] DSS Where to apply for food stamps, Medicaid and utility assistance. 732-255-5687 ? [facility]: Transportation to medical appointments. (321)118-3185 ? Social Security Administration: May apply for disability if have a Stage IV cancer. 610-058-2132 936-682-9539 ? [employer], Disability and Transit Services: Assists with nutrition, care and transit needs. ([phone] 740-6734

## 2015-05-14 NOTE — Progress Notes (Signed)
[facility] Hematology/Oncology Consultation   Name: Ryan Shea      MRN: [id number]  Date: 05/14/2015 Time:10:02 AM   REFERRING PHYSICIAN:  Barney Drain, MD (GI)  REASON FOR CONSULT: Normocytic, transfusion dependent anemia   DIAGNOSIS:  Normocytic, normochromic anemia with preservation of WBC and platelet count on vitamin K antagonist.  HISTORY OF PRESENT ILLNESS:   Ryan Shea is a 70 year old white American with a past medical history significant for rectal bleeding, paroxysmal atrial fibrillation on chronic vitamin K antagonists anticoagulation, severe aortic stenosis, NSTEMI, multiple duodenal ulcers, morbid obesity, hypertension, high cholesterol, COPD, H/O chronic kidney disease stage III, and cardiomyopathy who is referred to the [facility] for evaluation of normocytic anemia requiring transfusions. He status post an anemia panel in 04/18/2015 shows a low-normal vitamin B12 level at 298, folate at 21.1, TIBC of 337, low iron saturation of 5%, low serum iron at 17, and a ferritin of 34. Reticulocyte count show a reticulocytosis at 13.3%.  His anemia dates back to at least March 2016 at which point time he had a hemoglobin of 4.3.  He's been admitted to the hospital number occasions for acute GI blood loss. He is status post upper endoscopy by Barney Drain, MD (GI) on 05/31/2014 demonstrating 2 duodenal ulcers in the setting of anticoagulation in addition to mild nonerosive gastritis. Stomach biopsies demonstrate H. pylori gastritis.  It appears as though the patient's received 6 units of packed red blood cells over the past 12 months.  He received 4 units of blood on his first hospitalization in March 2016 and then the patient reports that he received 2 units with his most recent hospitalization. He also knows that he received 2 units of fresh frozen plasma.  He is S/P a camera capsule study on 04/18/2014 by Dr. Oneida Alar as well.  This demonstrated occasional  small bowel erosions due to ASA use without any active bleeding.  On 08/25/2014, the patient underwent a colonoscopy by Dr. Laural Golden showing hemorrhoidal/diverticular bleed.  Ryan Shea is accompanied by his wife. I have personally reviewed and went over laboratory studies with the patient and his wife from his last visit. He received one dose of IV injectafer on 2/28. He denies any difficulty.   Reports being "stopped up" with the weather. He does not have an inhaler.  PAST MEDICAL HISTORY:   Past Medical History  Diagnosis Date  . Asthma   . Hypertension   . COPD (chronic obstructive pulmonary disease) ([place])   . Cancer [facility])     Prostate   . Helicobacter pylori gastritis MAR 2016  . Multiple duodenal ulcers MAR 2016  . Atrial flutter ([place])   . Aortic valve stenosis   . Cardiomyopathy ([place])   . Mitral regurgitation   . Gout     ALLERGIES: No Known Allergies    MEDICATIONS: I have reviewed the patient's current medications.    Current Outpatient Prescriptions on File Prior to Visit  Medication Sig Dispense Refill  . albuterol (PROVENTIL) (2.5 MG/3ML) 0.083% nebulizer solution Take 3 mLs (2.5 mg total) by nebulization every 4 (four) hours as needed for wheezing or shortness of breath. 75 mL 12  . allopurinol (ZYLOPRIM) 100 MG tablet Take 1 tablet by mouth daily.    Marland Kitchen aspirin 81 MG chewable tablet Chew 1 tablet (81 mg total) by mouth daily. 30 tablet 0  . carvedilol (COREG) 6.25 MG tablet Take 1 tablet (6.25 mg total)  by mouth 2 (two) times daily with a meal. 180 tablet 3  . colchicine 0.6 MG tablet Take 1 tablet (0.6 mg total) by mouth daily. 20 tablet 0  . ferrous sulfate 325 (65 FE) MG tablet Take 325 mg by mouth at bedtime.     . Fluticasone-Salmeterol (ADVAIR DISKUS) 250-50 MCG/DOSE AEPB Inhale 1 puff into the lungs daily. 60 each 1  . furosemide (LASIX) 40 MG tablet Take 1 tablet (40 mg total) by mouth daily as needed for fluid or edema. 30 tablet 0  . gabapentin (NEURONTIN) 300  MG capsule Take 300 mg by mouth 3 (three) times daily as needed (for pain/neuropathy).     . pantoprazole (PROTONIX) 40 MG tablet Take 1 tablet (40 mg total) by mouth daily. 60 tablet 5  . pravastatin (PRAVACHOL) 40 MG tablet Take 1 tablet by mouth at bedtime.     Marland Kitchen tiotropium (SPIRIVA HANDIHALER) 18 MCG inhalation capsule Place 1 capsule (18 mcg total) into inhaler and inhale daily. 30 capsule 12  . VENTOLIN HFA 108 (90 BASE) MCG/ACT inhaler Inhale 1 puff into the lungs every 6 (six) hours as needed for wheezing or shortness of breath.     . warfarin (COUMADIN) 1 MG tablet Take 3.5 tablets (3.5 mg total) by mouth daily at 6 PM. (Patient taking differently: Take 2.5 mg by mouth daily at 6 PM. HOLD CRITICAL LOW HGB 04/17/15) 60 tablet 0  . zolpidem (AMBIEN) 10 MG tablet Take 0.5 tablets (5 mg total) by mouth at bedtime as needed for sleep. 30 tablet 0   No current facility-administered medications on file prior to visit.     PAST SURGICAL HISTORY Past Surgical History  Procedure Laterality Date  . Prostate surgery    . Hernia repair    . Esophagogastroduodenoscopy N/A 05/31/2014    H PYLORI GASTRITIS, DUODENAL ULCERS  . Colonoscopy    . Colonoscopy N/A 08/25/2014    Rehman:examination performed to cecum. moderate number of diverticula at sigmoid colon without stigmata of bleed small external hemorrhoids and anal papillae  . Cardiac catheterization N/A 03/26/2015    Procedure: Right/Left Heart Cath and Coronary Angiography;  Surgeon: Burnell Blanks, MD;  Location: [place] [facility];  Service: Cardiovascular;  Laterality: N/A;  . Givens capsule study N/A 04/18/2015    Procedure: GIVENS CAPSULE STUDY;  Surgeon: Danie Binder, MD;  Location: [facility];  Service: Endoscopy;  Laterality: N/A;    FAMILY HISTORY: Family History  Problem Relation Age of Onset  . Stroke Mother   . Cancer Brother   . Cancer Other   . Colon cancer Neg Hx   . Colon polyps Neg Hx   Mother is deceased at  the age of 61 from recurrent strokes Father deceased in his 32s from heart disease His one brother who is deceased from prostate cancer. He passed away in his 11s. He has 2 brothers who are still living, [name] who has diabetes and a history of prostate cancer and Eddie Dibbles who also has diabetes. His 2 sisters who are living, Joycelyn Schmid and [place]. Joycelyn Schmid has Alzheimer's. He has 3 children, 2 daughters and 1 son. Daughters ages are 23 and 68. Both are healthy. His son is 37 years old and healthy. His 8 grandchildren and 5 great-grandchildren. All healthy.  SOCIAL HISTORY:  reports that he quit smoking about 36 years ago. His smoking use included Cigarettes. He started smoking about 55 years ago. He has a 40 pack-year smoking history. He has quit  using smokeless tobacco. He reports that he uses illicit drugs (Methaqualone). He reports that he does not drink alcohol.  Pubis to the history of drinking alcohol quitting about 8 years ago taking up to a half a case per day. He's been married for 50 years. He does have a previous divorce. 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  PERFORMANCE STATUS: The patient's performance status is 1 - Symptomatic but completely ambulatory  PHYSICAL EXAM: Most Recent Vital Signs: Blood pressure 163/74, pulse 93, temperature 97.6 F (36.4 C), temperature source Oral, resp. rate 20, weight 242 lb 9.6 oz (110.043 kg). General appearance: alert, cooperative, appears stated age, no distress, morbidly obese and Accompanied by his wife. Wears glasses. Head: Normocephalic, without obvious abnormality, atraumatic Eyes: negative findings: lids and lashes normal, conjunctivae and sclerae normal and corneas clear Throat: normal findings: lips normal without lesions, buccal mucosa normal, tongue midline and normal and oropharynx pink & moist without lesions or evidence of thrush and abnormal findings: dentition: upper and lower dentures Neck: no  adenopathy, supple, symmetrical, trachea midline, thyroid not enlarged, symmetric, no tenderness/mass/nodules and Thick neck Lungs: Coarse breath sounds. Heart: regular rate and rhythm, S1, S2 normal and systolic murmur: systolic ejection 2/6, blowing at 2nd right intercostal space Abdomen: normal findings: bowel sounds normal, liver span normal to percussion, soft, non-tender and spleen non-palpable and abnormal findings:  obese Extremities: no edema, redness or tenderness in the calves or thighs Skin: Skin color, texture, turgor normal. No rashes or lesions. Burn on scalp consistent with dermatology removal. Lymph nodes: Cervical, supraclavicular, and axillary nodes normal. Neurologic: Alert and oriented X 3, normal strength and tone. Normal symmetric reflexes. Normal coordination and gait  LABORATORY DATA:  I have reviewed the data as listed. CBC    Component Value Date/Time   WBC 13.8* 04/30/2015 1300   RBC 3.17* 04/30/2015 1300   RBC 3.17* 04/30/2015 1300   HGB 9.2* 04/30/2015 1300   HCT 30.3* 04/30/2015 1300   PLT 473* 04/30/2015 1300   MCV 95.6 04/30/2015 1300   MCH 29.0 04/30/2015 1300   MCHC 30.4 04/30/2015 1300   RDW 14.3 04/30/2015 1300   LYMPHSABS 1.0 04/17/2015 1112   MONOABS 0.7 04/17/2015 1112   EOSABS 0.1 04/17/2015 1112   BASOSABS 0.0 04/17/2015 1112      Chemistry      Component Value Date/Time   NA 140 04/18/2015 0603   K 3.8 04/18/2015 0603   CL 103 04/18/2015 0603   CO2 31 04/18/2015 0603   BUN 13 04/18/2015 0603   CREATININE 1.04 04/18/2015 0603      Component Value Date/Time   CALCIUM 8.5* 04/18/2015 0603   ALKPHOS 73 04/17/2015 1112   AST 19 04/17/2015 1112   ALT 15* 04/17/2015 1112   BILITOT 0.5 04/17/2015 1112     Lab Results  Component Value Date   IRON 21* 04/30/2015   TIBC 326 04/30/2015   FERRITIN 49 04/30/2015   Lab Results  Component Value Date   VITAMINB12 298 04/18/2015   Lab Results  Component Value Date   FOLATE 21.1  04/18/2015      Results for TOMMIE, BOHLKEN (MRN [id number])   Ref. Range 04/30/2015 13:00  Total Protein ELP Latest Ref Range: 6.0-8.5 g/dL 5.9 (L)  Albumin ELP Latest Ref Range: 2.9-4.4 g/dL 2.7 (L)  Globulin, Total Latest Ref Range: 2.2-3.9 g/dL 3.2  A/G Ratio Latest Ref Range: 0.7-1.7  0.8  Alpha-1-Globulin Latest Ref  Range: 0.0-0.4 g/dL 0.3  Alpha-2-Globulin Latest Ref Range: 0.4-1.0 g/dL 1.0  Beta Globulin Latest Ref Range: 0.7-1.3 g/dL 1.1  Gamma Globulin Latest Ref Range: 0.4-1.8 g/dL 0.8  M-SPIKE, % Latest Ref Range: Not Observed g/dL Not Observed  SPE Interp. Unknown Comment  Comment Unknown Comment  IgG (Immunoglobin G), Serum Latest Ref Range: [phone] mg/dL 913  IgA Latest Ref Range: 61-437 mg/dL 217  IgM, Serum Latest Ref Range: 20-172 mg/dL 84  Kappa free light chain Latest Ref Range: 3.30-19.40 mg/L 55.01 (H)  Lamda free light chains Latest Ref Range: 5.71-26.30 mg/L 47.72 (H)  Kappa, lamda light chain ratio Latest Ref Range: 0.26-1.65  1.15   Results for CARLEE, TESFAYE (MRN [id number])   Ref. Range 04/30/2015 13:00  Haptoglobin Latest Ref Range: 34-200 mg/dL 357 (H)  Erythropoietin Latest Ref Range: 2.6-18.5 mIU/mL 34.2 (H)  Sed Rate Latest Ref Range: 0-16 mm/hr 128 (H)  Prothrombin Time Latest Ref Range: 11.6-15.2 seconds 14.7  INR Latest Ref Range: 0.00-1.49  1.13  Testosterone Latest Ref Range: [phone] ng/dL 177 (L)  Testosterone Free Latest Ref Range: 6.6-18.1 pg/mL 3.2 (L)  Testosterone-% Free Latest Ref Range: 0.2-0.7 % 1.0 (L)  TSH Latest Ref Range: 0.350-4.500 uIU/mL 0.156 (L)     RADIOGRAPHY: No results found.     PATHOLOGY:    Stomach, biopsy - HELICOBACTER PYLORI GASTRITIS. - NO EVIDENCE OF INTESTINAL METAPLASIA, DYSPLASIA, OR MALIGNANCY. - SEE COMMENT. Microscopic Comment A Warthin-Starry stain is performed to determine the possibility of the presence of Helicobacter pylori. Organisms of Helicobacter pylori are identified on the  Warthin-Starry stain. (HCL:ds 06/04/14) Aldona Bar MD Pathologist, Electronic Signature (Case signed 06/04/2014)  ASSESSMENT/PLAN:  Anemia Chronic anticoagulation Transfusion requirement GI bleed Thorough GI evaluation including pill cam with duodenal ulcers and diverticular bleed H. Pylori ESR at 128 Elevated inflammatory markers Iron deficiency with hemoccult positive stool Hypogonadism   I have recommended return in 6 weeks with repeat labs including CBC, iron studies and ferritin.  I suspect he has ongoing GI related blood loss and counts will have to be monitored closely moving forward. He also continues on coumadin.  B12 is borderline and in the future we may need to consider B12 replacement.  The cause of his anemia however is overall multifactorial including from hypogonadism.   He will return in 6 weeks for follow up and labs, including repeat CBC and iron studies.   All questions were answered. The patient knows to call the clinic with any problems, questions or concerns. We can certainly see the patient much sooner if necessary.  This document serves as a record of services personally performed by Ancil Linsey, MD. It was created on her behalf by Arlyce Harman, a trained medical scribe. The creation of this record is based on the scribe's personal observations and the provider's statements to them. This document has been checked and approved by the attending provider.  I have reviewed the above documentation for accuracy and completeness, and I agree with the above.  This note is electronically signed by:  Molli Hazard, MD   05/14/2015 10:02 AM

## 2015-05-15 LAB — PROTIME-INR: INR: 1.2 — AB (ref 0.9–1.1)

## 2015-05-21 ENCOUNTER — Encounter: Payer: Self-pay | Admitting: Surgery

## 2015-05-21 ENCOUNTER — Institutional Professional Consult (permissible substitution) (INDEPENDENT_AMBULATORY_CARE_PROVIDER_SITE_OTHER): Payer: Medicare PPO | Admitting: Surgery

## 2015-05-21 ENCOUNTER — Other Ambulatory Visit: Payer: Self-pay | Admitting: *Deleted

## 2015-05-21 VITALS — BP 140/70 | HR 94 | Resp 20 | Ht 66.0 in | Wt 242.0 lb

## 2015-05-21 DIAGNOSIS — I350 Nonrheumatic aortic (valve) stenosis: Secondary | ICD-10-CM | POA: Diagnosis not present

## 2015-05-23 ENCOUNTER — Encounter (HOSPITAL_COMMUNITY): Payer: Medicare PPO

## 2015-05-24 ENCOUNTER — Encounter: Payer: Self-pay | Admitting: Surgery

## 2015-05-24 NOTE — Progress Notes (Signed)
Cardiothoracic Surgery Consultation    PCP is PETERSON, Linden Dolin, MD Referring Provider is Lendon Colonel, NP  Chief Complaint  Patient presents with  . Aortic Stenosis    Surgical eval, Cardiac Cath 03/26/15, ECHO 03/24/15    HPI:  The patient is a 70 year old gentleman with a history of COPD, atrial flutter, hypertension, hyperlipidemia, stage 3 CKD, and GI blood loss with a history of duodenal ulcers and H. Pylori gastritis and chronic anemia who was admitted in January with acute respiratory failure and chest pain. He ruled in for a NSTEMI felt to be due to demand ischemia in the setting of pneumonia and respiratory failure. An echo on 03/24/2015 showed severe AS with a peak velocity of 415 cm/s and  a mean gradient of 36 mm Hg. He underwent cardiac cath on 03/26/2015 showing no significant coronary disease and severe AS with a mean gradient of 33 mm Hg and an AVA of 0.95 cm2.  He has been admitted to the hospital several times for acute GI blood loss and has received 6 units of blood over the past year. He had an EGD in 05/2014 showing two duodenal ulcers and H. Pylori gastritis. He had a capsule endoscopy 04/2014 that showed occasional small bowel erosions without active bleeding. Colonoscopy in 08/2014 showed diverticular disease and small hemorrhoids without evidence of bleeding. He saw Dr. Whitney Muse from Heme/Onc who felt that his anemia was multifactorial likely from ongoing GI blood loss as well as from hypogonadism.  Past Medical History  Diagnosis Date  . Asthma   . Hypertension   . COPD (chronic obstructive pulmonary disease) ([place])   . Cancer [facility])     Prostate   . Helicobacter pylori gastritis MAR 2016  . Multiple duodenal ulcers MAR 2016  . Atrial flutter ([place])   . Aortic valve stenosis   . Cardiomyopathy ([place])   . Mitral regurgitation   . Gout     Past Surgical History  Procedure Laterality Date  . Prostate surgery    . Hernia repair    .  Esophagogastroduodenoscopy N/A 05/31/2014    H PYLORI GASTRITIS, DUODENAL ULCERS  . Colonoscopy    . Colonoscopy N/A 08/25/2014    Rehman:examination performed to cecum. moderate number of diverticula at sigmoid colon without stigmata of bleed small external hemorrhoids and anal papillae  . Cardiac catheterization N/A 03/26/2015    Procedure: Right/Left Heart Cath and Coronary Angiography;  Surgeon: Burnell Blanks, MD;  Location: [place] [facility];  Service: Cardiovascular;  Laterality: N/A;  . Givens capsule study N/A 04/18/2015    Procedure: GIVENS CAPSULE STUDY;  Surgeon: Danie Binder, MD;  Location: [facility];  Service: Endoscopy;  Laterality: N/A;    Family History  Problem Relation Age of Onset  . Stroke Mother   . Cancer Brother   . Cancer Other   . Colon cancer Neg Hx   . Colon polyps Neg Hx     Social History Social History  Substance Use Topics  . Smoking status: Former Smoker -- 2.00 packs/day for 20 years    Types: Cigarettes    Start date: 09/01/1959    Quit date: 09/01/1978  . Smokeless tobacco: Former Systems developer     Comment: quit 30 years ago  . Alcohol Use: No    Current Outpatient Prescriptions  Medication Sig Dispense Refill  . albuterol (PROVENTIL) (2.5 MG/3ML) 0.083% nebulizer solution Take 3 mLs (2.5 mg total) by nebulization every 4 (four)  hours as needed for wheezing or shortness of breath. 75 mL 12  . allopurinol (ZYLOPRIM) 100 MG tablet Take 1 tablet by mouth daily.    Marland Kitchen aspirin 81 MG chewable tablet Chew 1 tablet (81 mg total) by mouth daily. 30 tablet 0  . carvedilol (COREG) 6.25 MG tablet Take 1 tablet (6.25 mg total) by mouth 2 (two) times daily with a meal. 180 tablet 3  . colchicine 0.6 MG tablet Take 1 tablet (0.6 mg total) by mouth daily. 20 tablet 0  . ferrous sulfate 325 (65 FE) MG tablet Take 325 mg by mouth at bedtime.     . Fluticasone-Salmeterol (ADVAIR DISKUS) 250-50 MCG/DOSE AEPB Inhale 1 puff into the lungs daily. 60 each 1  .  furosemide (LASIX) 40 MG tablet Take 1 tablet (40 mg total) by mouth daily as needed for fluid or edema. 30 tablet 0  . gabapentin (NEURONTIN) 300 MG capsule Take 300 mg by mouth 3 (three) times daily as needed (for pain/neuropathy).     . gabapentin (NEURONTIN) 600 MG tablet     . Iron Polysacch Cmplx-B12-FA 150-0.025-1 MG CAPS Take 1 capsule by mouth 2 (two) times daily. 60 each 0  . pantoprazole (PROTONIX) 40 MG tablet Take 1 tablet (40 mg total) by mouth daily. 60 tablet 5  . pravastatin (PRAVACHOL) 40 MG tablet Take 1 tablet by mouth at bedtime.     Marland Kitchen tiotropium (SPIRIVA HANDIHALER) 18 MCG inhalation capsule Place 1 capsule (18 mcg total) into inhaler and inhale daily. 30 capsule 12  . VENTOLIN HFA 108 (90 BASE) MCG/ACT inhaler Inhale 1 puff into the lungs every 6 (six) hours as needed for wheezing or shortness of breath.     . warfarin (COUMADIN) 1 MG tablet Take 3.5 tablets (3.5 mg total) by mouth daily at 6 PM. (Patient taking differently: Take 2.5 mg by mouth daily at 6 PM. HOLD CRITICAL LOW HGB 04/17/15) 60 tablet 0  . zolpidem (AMBIEN) 10 MG tablet Take 0.5 tablets (5 mg total) by mouth at bedtime as needed for sleep. 30 tablet 0   No current facility-administered medications for this visit.    No Known Allergies  Review of Systems  Constitutional: Positive for fever, activity change and fatigue. Negative for chills and unexpected weight change.  HENT: Negative.  Negative for dental problem.   Eyes: Negative.   Respiratory: Positive for shortness of breath.   Cardiovascular: Positive for palpitations and leg swelling. Negative for chest pain.  Gastrointestinal: Negative.   Endocrine: Negative.   Genitourinary: Negative.   Musculoskeletal: Negative.   Skin: Negative.   Allergic/Immunologic: Negative.   Neurological: Negative.   Hematological: Bruises/bleeds easily.  Psychiatric/Behavioral: Negative.     BP 140/70 mmHg  Pulse 94  Resp 20  Ht 5\' 6"  (1.676 m)  Wt 242 lb  (109.77 kg)  BMI 39.08 kg/m2  SpO2 95% Physical Exam  Constitutional: He is oriented to person, place, and time. He appears well-developed and well-nourished. No distress.  HENT:  Head: Normocephalic and atraumatic.  Mouth/Throat: Oropharynx is clear and moist.  Eyes: EOM are normal. Pupils are equal, round, and reactive to light.  Neck: Normal range of motion. Neck supple. No thyromegaly present.  Cardiovascular: Normal rate, regular rhythm and intact distal pulses.   Murmur heard. 3/6 systolic murmur at RSB  Pulmonary/Chest: Effort normal and breath sounds normal. No respiratory distress. He has no rales.  Abdominal: Soft. Bowel sounds are normal. He exhibits no distension and no mass. There is  no tenderness.  Musculoskeletal: Normal range of motion. He exhibits no edema.  Lymphadenopathy:    He has no cervical adenopathy.  Neurological: He is alert and oriented to person, place, and time. He has normal strength. No cranial nerve deficit or sensory deficit.  Skin: Skin is warm and dry.  Psychiatric: He has a normal mood and affect.     Diagnostic Tests:   *[facility]*  [address] [place], [place] [address]  [phone]  ------------------------------------------------------------------- Transthoracic Echocardiography  Patient: Carlie, Franchina MR #: [id number] Study Date: 03/24/2015 Gender: M Age: 31 Height: 167.6 cm Weight: 106.1 kg BSA: 2.27 m^2 Pt. Status: Room: 3S03C  ORDERING Peter Martinique, M.D. SONOGRAPHER Jimmy Reel, RDCS PERFORMING [facility], Inpatient  cc:  ------------------------------------------------------------------- LV EF: 55% - 60%  ------------------------------------------------------------------- Indications: CAD of native vessels  414.01.  ------------------------------------------------------------------- History: PMH: Atrial flutter. Aortic stenosis. Chronic obstructive pulmonary disease. Risk factors: Hypertension. Dyslipidemia.  ------------------------------------------------------------------- Study Conclusions  - Left ventricle: The cavity size was normal. There was mild  concentric hypertrophy. Systolic function was normal. The  estimated ejection fraction was in the range of 55% to 60%. Wall  motion was normal; there were no regional wall motion  abnormalities. Doppler parameters are consistent with abnormal  left ventricular relaxation (grade 1 diastolic dysfunction). - Aortic valve: There was severe stenosis.  Transthoracic echocardiography. M-mode, complete 2D, spectral Doppler, and color Doppler. Birthdate: Patient birthdate: 06-29-1945. Age: Patient is 70 yr old. Sex: Gender: male. BMI: 37.8 kg/m^2. Blood pressure: 126/68 Patient status: Inpatient. Study date: Study date: 03/24/2015. Study time: 10:46 AM. Location: ICU/CCU  -------------------------------------------------------------------  ------------------------------------------------------------------- Left ventricle: The cavity size was normal. There was mild concentric hypertrophy. Systolic function was normal. The estimated ejection fraction was in the range of 55% to 60%. Wall motion was normal; there were no regional wall motion abnormalities. Doppler parameters are consistent with abnormal left ventricular relaxation (grade 1 diastolic dysfunction).  ------------------------------------------------------------------- Aortic valve: Moderately thickened, mildly calcified leaflets. Doppler: There was severe stenosis. There was no significant regurgitation. Mean gradient (S): 36 mm Hg. Peak gradient (S): 69 mm  Hg.  ------------------------------------------------------------------- Aorta: The aorta was normal, not dilated, and non-diseased.  ------------------------------------------------------------------- Mitral valve: Mildly thickened leaflets . Doppler: There was no significant regurgitation. Peak gradient (D): 6 mm Hg.  ------------------------------------------------------------------- Left atrium: The atrium was at the upper limits of normal in size.  ------------------------------------------------------------------- Atrial septum: Poorly visualized.  ------------------------------------------------------------------- Right ventricle: The cavity size was normal. Wall thickness was normal. Systolic function was normal.  ------------------------------------------------------------------- Pulmonic valve: Poorly visualized.  ------------------------------------------------------------------- Tricuspid valve: Structurally normal valve. Leaflet separation was normal. Doppler: Transvalvular velocity was within the normal range. There was no regurgitation.  ------------------------------------------------------------------- Right atrium: The atrium was normal in size.  ------------------------------------------------------------------- Pericardium: There was no pericardial effusion.  ------------------------------------------------------------------- Systemic veins: Inferior vena cava: The vessel was normal in size. The respirophasic diameter changes were in the normal range (>= 50%), consistent with normal central venous pressure.  ------------------------------------------------------------------- Post procedure conclusions Ascending Aorta:  - The aorta was normal, not dilated, and non-diseased.  ------------------------------------------------------------------- Measurements  Left ventricle Value  Reference LV ID, ED, PLAX chordal 49.1 mm 43 - 52 LV ID, ES, PLAX chordal 34.5 mm 23 - 38 LV fx shortening, PLAX chordal 30 % >=29 LV PW thickness, ED 10.2 mm --------- IVS/LV PW ratio, ED 1.08 <=1.3 LV e&', lateral 6 cm/s --------- LV E/e&', lateral 19.67 --------- LV s&', lateral 7.2 cm/s --------- LV e&', medial  7.24 cm/s --------- LV E/e&', medial 16.3 --------- LV e&', average 6.62 cm/s --------- LV E/e&', average 17.82 ---------  Ventricular septum Value Reference IVS thickness, ED 11 mm ---------  LVOT Value Reference LVOT ID, S 24 mm --------- LVOT area 4.52 cm^2 ---------  Aortic valve Value Reference Aortic valve peak velocity, S 415 cm/s --------- Aortic valve mean velocity, S 280 cm/s --------- Aortic valve VTI, S 83 cm --------- Aortic mean gradient, S 36 mm Hg --------- Aortic peak gradient, S 69 mm Hg ---------  Aorta Value Reference Aortic root ID, ED 32 mm --------- Ascending aorta ID, A-P, S 33 mm ---------  Left atrium Value Reference LA ID, A-P, ES 40 mm --------- LA ID/bsa, A-P 1.76 cm/m^2 <=2.2 LA volume, S 105 ml --------- LA  volume/bsa, S 46.2 ml/m^2 --------- LA volume, ES, 1-p A4C 96 ml --------- LA volume/bsa, ES, 1-p A4C 42.3 ml/m^2 --------- LA volume, ES, 1-p A2C 107 ml --------- LA volume/bsa, ES, 1-p A2C 47.1 ml/m^2 ---------  Mitral valve Value Reference Mitral E-wave peak velocity 118 cm/s --------- Mitral A-wave peak velocity 154 cm/s --------- Mitral deceleration time 194 ms 150 - 230 Mitral peak gradient, D 6 mm Hg --------- Mitral E/A ratio, peak 0.8 ---------  Systemic veins Value Reference Estimated CVP 3 mm Hg ---------  Right ventricle Value Reference RV s&', lateral, S 13 cm/s ---------  Legend: (L) and (H) mark values outside specified reference range.  ------------------------------------------------------------------- Prepared and Electronically Authenticated by  Darlin Coco, MD 2017-01-16T14:56:10  Burnell Blanks, MD (Primary)      Procedures    Right/Left Heart Cath and Coronary Angiography    Conclusion    1. No angiographic evidence of CAD 2. Severe aortic valve stenosis (mean gradient 33 mmHg, peak gradient 39 mmHg, AVA 0.95 cm2).  3. Elevated troponin likely due to demand ischemia with severe AS and acute COPD exacerbation.   Recommendations: He has no evidence of CAD or plaque rupture. Elevated troponin likely due to demand ischemia given aortic stenosis and acute COPD exacerbation. Will hydrate overnight. Consider outpatient referral to CT surgery once COPD exacerbation resolved to discuss AVR.     Indications    NSTEMI (non-ST elevated myocardial infarction) ([place]) [I21.4 (ICD-10-CM)]    Aortic stenosis [I35.0 (ICD-10-CM)]    Technique and Indications    Estimated blood loss <50 mL. Indication: 70 yo male with history of atrial fibrillation, COPD admitted with COPD exacerbation. Troponin mildly elevated. Pt found to have severe aortic valve stenosis during this admission.   Procedure: The risks, benefits, complications, treatment options, and expected outcomes were discussed with the patient. The patient and/or family concurred with the proposed plan, giving informed consent. The patient was brought to the cath lab after IV hydration was begun and oral premedication was given. The patient was further sedated with Versed and Fentanyl. The right groin was prepped and draped in the usual manner. Using the modified Seldinger access technique, a 5 French sheath was placed in the right femoral artery and a 7 French sheath was placed in the right femoral vein. A balloon tipped catheter was used to perform a right heart catheterization. Standard diagnostic catheters were used to perform selective coronary angiography. I crossed the aortic valve with a JR4 catheter and straight wire. No left ventricular angiogram was performed.   There were no immediate complications. The patient was taken to the recovery area in stable condition.    Coronary Findings    Dominance: Right   Left Anterior Descending  .  Vessel is large. Vessel is angiographically normal.   . First Diagonal Branch   The vessel is small in size.   . First Septal Branch   The vessel is small in size.     Left Circumflex  . Vessel is moderate in size.   . First Obtuse Marginal Branch   The vessel is moderate in size.   . Third Obtuse Marginal Branch   The vessel is small in size.     Right Coronary Artery  . Vessel is moderate in size. Vessel is angiographically normal.   . Right Posterior Descending Artery   The vessel is small in size.       Right Heart Pressures LV EDP is normal.    Left Heart     Aortic Valve There is severe aortic valve stenosis.    Coronary Diagrams    Diagnostic Diagram            Implants     No implant documentation for this case.    PACS Images    Show images for Cardiac catheterization     Link to Procedure Log    Procedure Log      Hemo Data       Most Recent Value   Fick Cardiac Output  6.4 L/min   Fick Cardiac Output Index  3.03 (L/min)/BSA   Aortic Mean Gradient  33.1 mmHg   Aortic Peak Gradient  39 mmHg   Aortic Valve Area  0.95   Aortic Value Area Index  0.45 cm2/BSA   RA A Wave  8 mmHg   RA V Wave  8 mmHg   RA Mean  7 mmHg   RV Systolic Pressure  33 mmHg   RV Diastolic Pressure  5 mmHg   RV EDP  10 mmHg   PA Systolic Pressure  26 mmHg   PA Diastolic Pressure  12 mmHg   PA Mean  14 mmHg   PW A Wave  12 mmHg   PW V Wave  10 mmHg   PW Mean  10 mmHg   AO Systolic Pressure  123XX123 mmHg   AO Diastolic Pressure  68 mmHg   AO Mean  87 mmHg   LV Systolic Pressure  [id number] mmHg   LV Diastolic Pressure  13 mmHg   LV EDP  17 mmHg   Arterial Occlusion Pressure Extended Systolic Pressure  123456 mmHg   Arterial Occlusion Pressure Extended Diastolic Pressure  62 mmHg   Arterial Occlusion Pressure Extended Mean Pressure  86 mmHg   Left Ventricular Apex Extended Systolic Pressure  0000000 mmHg   Left Ventricular Apex Extended Diastolic Pressure  11 mmHg   Left Ventricular Apex Extended EDP Pressure  16 mmHg   QP/QS  1   TPVR Index  4.61 HRUI   TSVR Index  28.69 HRUI   PVR SVR Ratio  0.05   TPVR/TSVR Ratio  0.16    Impression:  He has stage D severe, symptomatic aortic stenosis with progressive shortness of breath with mild activity, NYHA class 2, fatigue and leg swelling. He had a recent admission for CAP and respiratory failure that may have been exacerbated by his severe AS. He also has a history of paroxysmal atrial flutter on anticoagulation. I agree that AVR is indicated in this patient. He is  at low risk for surgery and not a TAVR candidate. I would plan to use a tissue valve at age 22 and would clip his left atrial appendage because he  may need to get off anticoagulation if he continues to have GI blood loss and that will decrease his risk of stroke. I discussed the operative procedure with the patient and family including alternatives, benefits and risks; including but not limited to bleeding, blood transfusion, infection, stroke, myocardial infarction, graft failure, heart block requiring a permanent pacemaker, organ dysfunction, and death.  Ryan Shea understands and agrees to proceed.    Plan:  AVR using a pericardial valve and clipping of the left atrial appendage.    Gaye Pollack, MD [facility] [phone]

## 2015-05-26 ENCOUNTER — Other Ambulatory Visit: Payer: Self-pay | Admitting: *Deleted

## 2015-05-26 DIAGNOSIS — I350 Nonrheumatic aortic (valve) stenosis: Secondary | ICD-10-CM

## 2015-05-29 LAB — PROTIME-INR
INR: 1.7 — AB (ref 0.9–1.1)
INR: 1.7 — AB (ref 0.9–1.1)

## 2015-06-04 ENCOUNTER — Encounter: Payer: Self-pay | Admitting: Surgery

## 2015-06-04 ENCOUNTER — Ambulatory Visit (HOSPITAL_COMMUNITY)
Admission: RE | Admit: 2015-06-04 | Discharge: 2015-06-04 | Disposition: A | Discharge status: Home / Self Care | Payer: Medicare PPO | Source: Ambulatory Visit | Attending: Surgery | Admitting: Surgery

## 2015-06-04 DIAGNOSIS — I350 Nonrheumatic aortic (valve) stenosis: Secondary | ICD-10-CM | POA: Insufficient documentation

## 2015-06-04 LAB — PULMONARY FUNCTION TEST
DL/VA % pred: 103 %
DL/VA: 4.48 ml/min/mmHg/L
DLCO UNC: 14.55 ml/min/mmHg
DLCO unc % pred: 54 %
FEF 25-75 Post: 0.33 L/sec
FEF 25-75 Pre: 0.53 L/sec
FEF2575-%Change-Post: -38 %
FEF2575-%Pred-Post: 15 %
FEF2575-%Pred-Pre: 25 %
FEV1-%CHANGE-POST: -8 %
FEV1-%PRED-PRE: 38 %
FEV1-%Pred-Post: 35 %
FEV1-POST: 0.97 L
FEV1-Pre: 1.06 L
FEV1FVC-%Change-Post: 7 %
FEV1FVC-%Pred-Pre: 75 %
FEV6-%Change-Post: -14 %
FEV6-%PRED-POST: 46 %
FEV6-%Pred-Pre: 53 %
FEV6-POST: 1.62 L
FEV6-PRE: 1.89 L
FEV6FVC-%CHANGE-POST: 1 %
FEV6FVC-%PRED-POST: 106 %
FEV6FVC-%PRED-PRE: 104 %
FVC-%Change-Post: -14 %
FVC-%PRED-POST: 43 %
FVC-%Pred-Pre: 51 %
FVC-Post: 1.64 L
FVC-Pre: 1.92 L
PRE FEV6/FVC RATIO: 99 %
Post FEV1/FVC ratio: 59 %
Post FEV6/FVC ratio: 100 %
Pre FEV1/FVC ratio: 55 %
RV % PRED: 176 %
RV: 3.97 L
TLC % PRED: 92 %
TLC: 5.75 L

## 2015-06-04 MED ORDER — ALBUTEROL SULFATE (2.5 MG/3ML) 0.083% IN NEBU
2.5000 mg | INHALATION_SOLUTION | Freq: Once | RESPIRATORY_TRACT | Status: AC
  Administered 2015-06-04: 2.5 mg via RESPIRATORY_TRACT

## 2015-06-04 NOTE — Progress Notes (Unsigned)
Patient ID: Ryan Shea, male   DOB: 05-14-1945, 70 y.o.   MRN: [id number] RISK SCORES About the STS Risk Calculator Procedure: AV Replacement  Risk of Mortality: 2.668%  Morbidity or Mortality: 18.518%  Long Length of Stay: 8.146%  Short Length of Stay: 31.444%  Permanent Stroke: 0.959%  Prolonged Ventilation: 12.385%  DSW Infection: 0.509%  Renal Failure: 3.996%  Reoperation: 7.893%  PRINT  CLEAR

## 2015-06-06 ENCOUNTER — Other Ambulatory Visit: Payer: Self-pay | Admitting: *Deleted

## 2015-06-06 DIAGNOSIS — I350 Nonrheumatic aortic (valve) stenosis: Secondary | ICD-10-CM

## 2015-06-09 LAB — PROTIME-INR
INR: 3.3 — AB (ref 0.9–1.1)
INR: 3.7 — AB (ref 0.9–1.1)

## 2015-06-10 LAB — COMPLETE METABOLIC PANEL WITH GFR
ALT: 12 U/L (ref 9–46)
AST: 14 U/L (ref 10–35)
Albumin: 3.6 g/dL (ref 3.6–5.1)
Alkaline Phosphatase: 67 U/L (ref 40–115)
BILIRUBIN TOTAL: 0.3 mg/dL (ref 0.2–1.2)
BUN: 17 mg/dL (ref 7–25)
CO2: 32 mmol/L — AB (ref 20–31)
CREATININE: 1.07 mg/dL (ref 0.70–1.18)
Calcium: 8.6 mg/dL (ref 8.6–10.3)
Chloride: 100 mmol/L (ref 98–110)
GFR, EST AFRICAN AMERICAN: 81 mL/min (ref >=60)
GFR, Est Non African American: 70 mL/min (ref >=60)
Glucose, Bld: 101 mg/dL — ABNORMAL HIGH (ref 65–99)
Potassium: 3.9 mmol/L (ref 3.5–5.3)
Sodium: 140 mmol/L (ref 135–146)
TOTAL PROTEIN: 6.2 g/dL (ref 6.1–8.1)

## 2015-06-11 ENCOUNTER — Ambulatory Visit (HOSPITAL_COMMUNITY)
Admission: RE | Admit: 2015-06-11 | Discharge: 2015-06-11 | Disposition: A | Discharge status: Home / Self Care | Payer: Medicare PPO | Source: Ambulatory Visit | Attending: Surgery | Admitting: Surgery

## 2015-06-11 ENCOUNTER — Ambulatory Visit (HOSPITAL_COMMUNITY): Payer: Medicare PPO

## 2015-06-11 DIAGNOSIS — J432 Centrilobular emphysema: Secondary | ICD-10-CM | POA: Insufficient documentation

## 2015-06-11 DIAGNOSIS — R918 Other nonspecific abnormal finding of lung field: Secondary | ICD-10-CM | POA: Insufficient documentation

## 2015-06-11 DIAGNOSIS — K5730 Diverticulosis of large intestine without perforation or abscess without bleeding: Secondary | ICD-10-CM | POA: Insufficient documentation

## 2015-06-11 DIAGNOSIS — I350 Nonrheumatic aortic (valve) stenosis: Secondary | ICD-10-CM

## 2015-06-11 DIAGNOSIS — K753 Granulomatous hepatitis, not elsewhere classified: Secondary | ICD-10-CM | POA: Diagnosis not present

## 2015-06-11 IMAGING — CT CT CTA ABD/PEL W/CM AND/OR W/O CM
1 series · 1 of 9 positions shown · IV contrast (isovue)
Comparison: None.

CLINICAL DATA: 69-year-old male with history of severe aortic
stenosis. Preprocedural study prior to potential transcatheter
aortic valve replacement (TAVR).

EXAM:
CT ANGIOGRAPHY CHEST, ABDOMEN AND PELVIS
TECHNIQUE: Multidetector CT imaging through the chest, abdomen and pelvis was
performed using the standard protocol during bolus administration of
intravenous contrast. Multiplanar reconstructed images and MIPs were
obtained and reviewed to evaluate the vascular anatomy.
CONTRAST:  70 mL of Isovue 370.

[Series 293: — · 0.35mm/px · 1 of 9 slices shown]
[im 5/9]
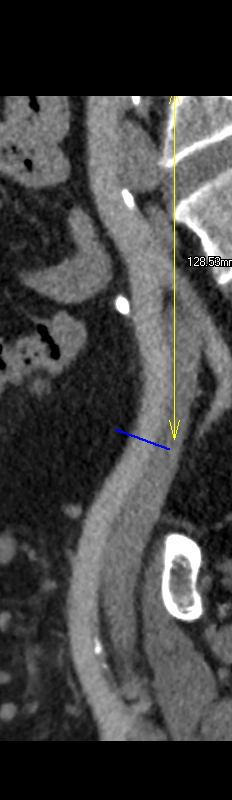

[1 of 9 positions shown; findings below may reference images not displayed]

FINDINGS: CTA CHEST FINDINGS

Mediastinum/Lymph Nodes: Heart size is normal. There is no
significant pericardial fluid, thickening or pericardial
calcification. There is atherosclerosis of the thoracic aorta, the
great vessels of the mediastinum and the coronary arteries,
including calcified atherosclerotic plaque in the left main and left
anterior descending coronary arteries. Severe thickening
calcification of the aortic valve. Mild calcifications of the mitral
valve. No pathologically enlarged mediastinal or hilar lymph nodes.
Esophagus is unremarkable in appearance. Heterogeneously enlarged
thyroid gland with multiple nodules, the largest of which is in the
anterior aspect of the right lobe of the gland measuring
approximately 1.9 x 2.6 cm (image 16 of series 501). No axillary
lymphadenopathy.

Lungs/Pleura: In the medial aspect of the left lower lobe there are
several clustered micronodules measuring up to 5 mm in size (image
97 of series 507), favored to be a related to areas of mucoid
impaction within terminal bronchioles. No other more suspicious
appearing pulmonary nodules or masses are noted. Mild diffuse
bronchial wall thickening with mild centrilobular emphysema. No
acute consolidative airspace disease. No pleural effusions.

Musculoskeletal/Soft Tissues: There are no aggressive appearing
lytic or blastic lesions noted in the visualized portions of the
skeleton.

CTA ABDOMEN AND PELVIS FINDINGS

Hepatobiliary: Several small calcified granulomas are noted in the
liver. No suspicious cystic or solid hepatic lesions. No intra or
extrahepatic biliary ductal dilatation. Gallbladder is normal in
appearance.

Pancreas: No pancreatic mass. No pancreatic ductal dilatation. No
pancreatic or peripancreatic fluid or inflammatory changes.

Spleen: Unremarkable.

Adrenals/Urinary Tract: Bilateral adrenal glands are normal in
appearance. Multifocal cortical thinning in the kidneys bilaterally,
indicative of mild post infectious or inflammatory scarring.
Exophytic 5.6 cm low-attenuation lesion in the interpolar region of
the left kidney is compatible with a simple cyst. No suspicious
renal lesions. No hydroureteronephrosis. Urinary bladder is normal
in appearance.

Stomach/Bowel: The appearance of the stomach is normal. There is no
pathologic dilatation of small bowel or colon. Numerous colonic
diverticulae are noted, particularly in the sigmoid colon, without
surrounding inflammatory changes to suggest an acute diverticulitis
at this time. Normal appendix.

Vascular/Lymphatic: Atherosclerosis throughout the abdominal and
pelvic vasculature, without evidence of aneurysm or dissection.
Vascular findings and measurements pertinent to potential TAVR
procedure, as detailed below. Single renal arteries bilaterally. No
lymphadenopathy noted in the abdomen or pelvis.

Reproductive: Postoperative changes of prostatectomy are noted.

Other: No significant volume of ascites.  No pneumoperitoneum.

Musculoskeletal: There are no aggressive appearing lytic or blastic
lesions noted in the visualized portions of the skeleton.

VASCULAR MEASUREMENTS PERTINENT TO TAVR:

AORTA:

Minimal Aortic Diameter -  11 x 13 mm

Severity of Aortic Calcification -  moderate

RIGHT PELVIS:

Right Common Iliac Artery -

Minimal Diameter - 9.4 x 10.1 mm

Tortuosity - mild

Calcification - mild

Right External Iliac Artery -

Minimal Diameter - 8.6 x 6.3 mm

Tortuosity - mild

Calcification - none

Right Common Femoral Artery -

Minimal Diameter - 8.6 x 7.1 mm

Tortuosity - mild

Calcification - mild

LEFT PELVIS:

Left Common Iliac Artery -

Minimal Diameter - 9.6 x 8.2 mm

Tortuosity - mild

Calcification - mild

Left External Iliac Artery -

Minimal Diameter - 9.5 x 9.1 mm

Tortuosity - mild

Calcification - none

Left Common Femoral Artery -

Minimal Diameter - 8.9 x 7.3 mm

Tortuosity - mild

Calcification - mild

Review of the MIP images confirms the above findings.
IMPRESSION: 1. Vascular findings and measurements pertinent to potential TAVR
procedure, as detailed above. This patient appears to have suitable
pelvic arterial access bilaterally.
2. Severe thickening calcification of the aortic valve, compatible
with the reported clinical history of severe aortic stenosis.
3. Cluster of small nodules in the left lower lobe measuring 5 mm or
less in size. No follow-up needed if patient is low-risk (and has no
known or suspected primary neoplasm). Non-contrast chest CT can be
considered in 12 months if patient is high-risk. This recommendation
follows the consensus statement: Guidelines for Management of
Incidental Pulmonary Nodules Detected on CT Images:From the
[HOSPITAL] 4116; published online before print
(10.1148/[id number]).
4. Mild diffuse bronchial wall thickening with mild centrilobular
emphysema; imaging findings suggestive of underlying COPD.
5. Colonic diverticulosis without evidence of acute diverticulitis
at this time.
6. Additional incidental findings, as above.

## 2015-06-11 MED ORDER — IOPAMIDOL (ISOVUE-370) INJECTION 76%
INTRAVENOUS | Status: AC
  Filled 2015-06-11: qty 50

## 2015-06-11 MED ORDER — IOPAMIDOL (ISOVUE-370) INJECTION 76%
75.0000 mL | Freq: Once | INTRAVENOUS | Status: AC | PRN
  Administered 2015-06-11: 100 mL via INTRAVENOUS

## 2015-06-11 MED ORDER — IOHEXOL 350 MG/ML SOLN
80.0000 mL | Freq: Once | INTRAVENOUS | Status: AC | PRN
  Administered 2015-06-11: 80 mL via INTRAVENOUS

## 2015-06-11 MED ORDER — IOPAMIDOL (ISOVUE-370) INJECTION 76%
INTRAVENOUS | Status: AC
  Filled 2015-06-11: qty 100

## 2015-06-12 ENCOUNTER — Encounter: Payer: Self-pay | Admitting: Thoracic Surgery (Cardiothoracic Vascular Surgery)

## 2015-06-12 ENCOUNTER — Institutional Professional Consult (permissible substitution) (INDEPENDENT_AMBULATORY_CARE_PROVIDER_SITE_OTHER): Payer: Medicare PPO | Admitting: Thoracic Surgery (Cardiothoracic Vascular Surgery)

## 2015-06-12 VITALS — BP 144/74 | HR 85 | Resp 18 | Ht 66.0 in | Wt 242.0 lb

## 2015-06-12 DIAGNOSIS — I350 Nonrheumatic aortic (valve) stenosis: Secondary | ICD-10-CM

## 2015-06-12 NOTE — Progress Notes (Signed)
[place] SURGERY CONSULTATION REPORT  Referring Provider is Lendon Colonel, NP  Primary Cardiologist is [name], MD PCP is Shade Flood, MD  Chief Complaint  Patient presents with  . Aortic Stenosis    2nd TAVR eval....to review all studies    HPI:  Patient is 70 year old obese white male with history of aortic stenosis, congestive heart failure, hypertension, COPD, atrial flutter on warfarin anticoagulation, recurrent GI bleeding, and hyperlipidemia who has been referred for a second surgical opinion to discuss treatment options for management of severe symptomatic aortic stenosis.  The patient was first diagnosed with congestive heart failure in 2015. He was referred for cardiology consult and evaluated by Dr. Bronson Ing 04/23/2013. At that time he was found to be in atrial flutter. He was started on a beta blocker and warfarin anticoagulation.  At that time an echocardiogram revealed normal left ventricular systolic function with ejection fraction estimated 50-55%. There was moderate aortic stenosis with peak velocity across the aortic valve reported 3.1 m/s corresponding to mean transvalvular gradient estimated 20 mmHg.  He was seen in follow-up several months later which time he was doing much better on medical therapy.  Approximately one year ago he suffered an upper GI bleed while he was anticoagulated on Coumadin.  He required a total of 4 units of packed red blood cells for severe acute blood loss anemia.  EGD performed at that time revealed 2 duodenal ulcers and H. Pylori gastritis.  In June 2016 he suffered a second episode of GI bleeding related to external hemorrhoids. Colonoscopy revealed mild diverticular disease and small external hemorrhoids.  He did not require transfusion at that time.  In December 2016 the patient was hospitalized briefly with acute respiratory failure felt secondary  to COPD exacerbation.  He was hospitalized again in early January of this year with acute on chronic respiratory failure with hypoxia. He was treated for presumed COPD exacerbation and pneumonia but ruled in for an acute non-ST segment elevation myocardial infarction with peak troponin measured 1.41.  Echocardiogram performed at that time revealed severe aortic stenosis with peak velocity across the aortic valve measured 4.1 m per sec and corresponding to mean transvalvular gradient estimated 36 mmHg.  Left ventricular systolic function remained normal with ejection fraction estimated at 55-60%.  Diagnostic cardiac catheterization was notable for mild nonobstructive coronary artery disease but confirmed the presence of severe aortic stenosis with peak to peak and mean transvalvular gradients measured 39 and 33 mmHg, respectively, corresponding to aortic valve area calculated 0.95 cm.  He recovered uneventfully and was discharged home but readmitted in early February with symptomatic anemia and Hemoccult positive stool. He required 2 units packed red blood cells.  He underwent capsule endoscopy that reportedly demonstrated small bowel erosions felt secondary to aspirin use.  The patient was referred for surgical consultation to discuss treatment options for management of severe aortic stenosis. He was initially evaluated by Dr. Cyndia Bent on 05/24/2015. Since then he has undergone pulmonary function tests which reveal findings consistent with severe COPD. He has been referred for a second surgical opinion to discuss treatment options further.  The patient is married and lives in [place] with his wife. He has been retired for many years. He lives a somewhat sedentary lifestyle. He states that he is limited primarily by exertional shortness of breath. He has a long history of exertional shortness of breath that has progressed substantially over the last 2 years.  He now gets short of breath with moderate low-level  activity. He overall has much less energy than he used to and he gets fatigued easily.  He denies any recent episodes of resting shortness of breath but he does experience intermittent episodes of PND. He has never had any exertional chest pain or chest tightness. He cannot lie flat when he sleeps in bed. He experiences some chronic bilateral lower extremity edema. He has not had palpitations, dizzy spells, or syncope.  He has not had any recent episodes of hematochezia, hematemesis, or melena over the past month. He remains anticoagulated using warfarin.  Past Medical History  Diagnosis Date  . Asthma   . Hypertension   . COPD (chronic obstructive pulmonary disease) ([place])   . Cancer [facility])     Prostate   . Helicobacter pylori gastritis MAR 2016  . Multiple duodenal ulcers MAR 2016  . Atrial flutter ([place])   . Aortic valve stenosis   . Cardiomyopathy ([place])   . Mitral regurgitation   . Gout     Past Surgical History  Procedure Laterality Date  . Prostate surgery    . Hernia repair    . Esophagogastroduodenoscopy N/A 05/31/2014    H PYLORI GASTRITIS, DUODENAL ULCERS  . Colonoscopy    . Colonoscopy N/A 08/25/2014    Rehman:examination performed to cecum. moderate number of diverticula at sigmoid colon without stigmata of bleed small external hemorrhoids and anal papillae  . Cardiac catheterization N/A 03/26/2015    Procedure: Right/Left Heart Cath and Coronary Angiography;  Surgeon: Burnell Blanks, MD;  Location: [facility];  Service: Cardiovascular;  Laterality: N/A;  . Givens capsule study N/A 04/18/2015    Procedure: GIVENS CAPSULE STUDY;  Surgeon: Danie Binder, MD;  Location: [facility];  Service: Endoscopy;  Laterality: N/A;    Family History  Problem Relation Age of Onset  . Stroke Mother   . Cancer Brother   . Cancer Other   . Colon cancer Neg Hx   . Colon polyps Neg Hx     Social History   Social History  . Marital Status: Married    Spouse Name: N/A    . Number of Children: N/A  . Years of Education: N/A   Occupational History  . Not on file.   Social History Main Topics  . Smoking status: Former Smoker -- 2.00 packs/day for 20 years    Types: Cigarettes    Start date: 09/01/1959    Quit date: 09/01/1978  . Smokeless tobacco: Former Systems developer     Comment: quit 30 years ago  . Alcohol Use: No  . Drug Use: Yes    Special: Methaqualone  . Sexual Activity: Not on file   Other Topics Concern  . Not on file   Social History Narrative    Current Outpatient Prescriptions  Medication Sig Dispense Refill  . albuterol (PROVENTIL) (2.5 MG/3ML) 0.083% nebulizer solution Take 3 mLs (2.5 mg total) by nebulization every 4 (four) hours as needed for wheezing or shortness of breath. 75 mL 12  . allopurinol (ZYLOPRIM) 100 MG tablet Take 1 tablet by mouth daily.    Marland Kitchen aspirin 81 MG chewable tablet Chew 1 tablet (81 mg total) by mouth daily. 30 tablet 0  . carvedilol (COREG) 6.25 MG tablet Take 1 tablet (6.25 mg total) by mouth 2 (two) times daily with a meal. 180 tablet 3  . colchicine 0.6 MG tablet Take 1 tablet (0.6 mg total) by mouth daily. Rossie  tablet 0  . ferrous sulfate 325 (65 FE) MG tablet Take 325 mg by mouth at bedtime.     . Fluticasone-Salmeterol (ADVAIR DISKUS) 250-50 MCG/DOSE AEPB Inhale 1 puff into the lungs daily. 60 each 1  . furosemide (LASIX) 40 MG tablet Take 1 tablet (40 mg total) by mouth daily as needed for fluid or edema. 30 tablet 0  . gabapentin (NEURONTIN) 300 MG capsule Take 300 mg by mouth 3 (three) times daily as needed (for pain/neuropathy).     . gabapentin (NEURONTIN) 600 MG tablet     . Iron Polysacch Cmplx-B12-FA 150-0.025-1 MG CAPS Take 1 capsule by mouth 2 (two) times daily. 60 each 0  . pantoprazole (PROTONIX) 40 MG tablet Take 1 tablet (40 mg total) by mouth daily. 60 tablet 5  . pravastatin (PRAVACHOL) 40 MG tablet Take 1 tablet by mouth at bedtime.     Marland Kitchen tiotropium (SPIRIVA HANDIHALER) 18 MCG inhalation capsule  Place 1 capsule (18 mcg total) into inhaler and inhale daily. 30 capsule 12  . VENTOLIN HFA 108 (90 BASE) MCG/ACT inhaler Inhale 1 puff into the lungs every 6 (six) hours as needed for wheezing or shortness of breath.     . warfarin (COUMADIN) 1 MG tablet Take 3.5 tablets (3.5 mg total) by mouth daily at 6 PM. (Patient taking differently: Take 2.5 mg by mouth daily at 6 PM. OR AS DIRECTED) 60 tablet 0  . zolpidem (AMBIEN) 10 MG tablet Take 0.5 tablets (5 mg total) by mouth at bedtime as needed for sleep. 30 tablet 0   No current facility-administered medications for this visit.    No Known Allergies    Review of Systems:   General:  normal appetite, decreased energy, no weight gain, no weight loss, no fever  Cardiac:  no chest pain with exertion, no chest pain at rest, + SOB with exertion, no resting SOB, + PND, + orthopnea, no palpitations, + arrhythmia, no atrial fibrillation, + LE edema, no dizzy spells, no syncope  Respiratory:  + chronic shortness of breath, no home oxygen, no productive cough, + dry cough, no bronchitis, + wheezing, no hemoptysis, no asthma, no pain with inspiration or cough, + "borderline" sleep apnea, no CPAP at night  GI:   no difficulty swallowing, no reflux, no frequent heartburn, no hiatal hernia, no abdominal pain, no constipation, no diarrhea, no hematochezia, no hematemesis, no melena  GU:   no dysuria,  no frequency, no urinary tract infection, no hematuria, no enlarged prostate, no kidney stones, no kidney disease  Vascular:  no pain suggestive of claudication, no pain in feet, occasional leg cramps, no varicose veins, no DVT, no non-healing foot ulcer  Neuro:   no stroke, no TIA's, no seizures, no headaches, no temporary blindness one eye,  no slurred speech, no peripheral neuropathy, no chronic pain, no instability of gait, no memory/cognitive dysfunction  Musculoskeletal: mild arthritis, no joint swelling, no myalgias, no difficulty walking, normal mobility     Skin:   no rash, no itching, no skin infections, no pressure sores or ulcerations  Psych:   no anxiety, no depression, no nervousness, no unusual recent stress  Eyes:   no blurry vision, no floaters, no recent vision changes, + wears glasses or contacts  ENT:   no hearing loss, edentulous, + dentures, last saw dentist many years ago  Hematologic:  + easy bruising, + abnormal bleeding, no clotting disorder, no frequent epistaxis  Endocrine:  no diabetes, does not check CBG's at home  Physical Exam:   BP 144/74 mmHg  Pulse 85  Resp 18  Ht 5\' 6"  (1.676 m)  Wt 242 lb (109.77 kg)  BMI 39.08 kg/m2  SpO2 93%  General:  Very obese,  well-appearing  HEENT:  Unremarkable   Neck:   no JVD, no bruits, no adenopathy   Chest:   clear to auscultation, symmetrical breath sounds, no wheezes, no rhonchi   CV:   RRR, grade III/VI crescendo/decrescendo murmur heard best at LSB,  no diastolic murmur  Abdomen:  soft, non-tender, no masses   Extremities:  warm, well-perfused, pulses diminished, + mild LE edema  Rectal/GU  Deferred  Neuro:   Grossly non-focal and symmetrical throughout  Skin:   Clean and dry, no rashes, no breakdown   Diagnostic Tests:  Transthoracic Echocardiography  Patient: Lamarrion, Bonardi MR #: [id number] Study Date: 03/24/2015 Gender: M Age: 76 Height: 167.6 cm Weight: 106.1 kg BSA: 2.27 m^2 Pt. Status: Room: 3S03C  ORDERING Peter Martinique, M.D. SONOGRAPHER Jimmy Reel, RDCS PERFORMING [facility], Inpatient  cc:  ------------------------------------------------------------------- LV EF: 55% - 60%  ------------------------------------------------------------------- Indications: CAD of native vessels 414.01.  ------------------------------------------------------------------- History: PMH: Atrial flutter. Aortic stenosis. Chronic obstructive pulmonary disease. Risk factors:  Hypertension. Dyslipidemia.  ------------------------------------------------------------------- Study Conclusions  - Left ventricle: The cavity size was normal. There was mild  concentric hypertrophy. Systolic function was normal. The  estimated ejection fraction was in the range of 55% to 60%. Wall  motion was normal; there were no regional wall motion  abnormalities. Doppler parameters are consistent with abnormal  left ventricular relaxation (grade 1 diastolic dysfunction). - Aortic valve: There was severe stenosis.  Transthoracic echocardiography. M-mode, complete 2D, spectral Doppler, and color Doppler. Birthdate: Patient birthdate: 1945-04-24. Age: Patient is 70 yr old. Sex: Gender: male. BMI: 37.8 kg/m^2. Blood pressure: 126/68 Patient status: Inpatient. Study date: Study date: 03/24/2015. Study time: 10:46 AM. Location: ICU/CCU  -------------------------------------------------------------------  ------------------------------------------------------------------- Left ventricle: The cavity size was normal. There was mild concentric hypertrophy. Systolic function was normal. The estimated ejection fraction was in the range of 55% to 60%. Wall motion was normal; there were no regional wall motion abnormalities. Doppler parameters are consistent with abnormal left ventricular relaxation (grade 1 diastolic dysfunction).  ------------------------------------------------------------------- Aortic valve: Moderately thickened, mildly calcified leaflets. Doppler: There was severe stenosis. There was no significant regurgitation. Mean gradient (S): 36 mm Hg. Peak gradient (S): 69 mm Hg.  ------------------------------------------------------------------- Aorta: The aorta was normal, not dilated, and non-diseased.  ------------------------------------------------------------------- Mitral valve: Mildly thickened leaflets . Doppler:  There was no significant regurgitation. Peak gradient (D): 6 mm Hg.  ------------------------------------------------------------------- Left atrium: The atrium was at the upper limits of normal in size.  ------------------------------------------------------------------- Atrial septum: Poorly visualized.  ------------------------------------------------------------------- Right ventricle: The cavity size was normal. Wall thickness was normal. Systolic function was normal.  ------------------------------------------------------------------- Pulmonic valve: Poorly visualized.  ------------------------------------------------------------------- Tricuspid valve: Structurally normal valve. Leaflet separation was normal. Doppler: Transvalvular velocity was within the normal range. There was no regurgitation.  ------------------------------------------------------------------- Right atrium: The atrium was normal in size.  ------------------------------------------------------------------- Pericardium: There was no pericardial effusion.  ------------------------------------------------------------------- Systemic veins: Inferior vena cava: The vessel was normal in size. The respirophasic diameter changes were in the normal range (>= 50%), consistent with normal central venous pressure.  ------------------------------------------------------------------- Post procedure conclusions Ascending Aorta:  - The aorta was normal, not dilated, and non-diseased.  ------------------------------------------------------------------- Measurements  Left ventricle Value Reference LV ID, ED, PLAX chordal 49.1 mm 43 - 52 LV ID, ES, PLAX chordal 34.5  mm 23 - 38 LV fx shortening, PLAX chordal 30 % >=29 LV PW thickness, ED 10.2 mm --------- IVS/LV PW  ratio, ED 1.08 <=1.3 LV e&', lateral 6 cm/s --------- LV E/e&', lateral 19.67 --------- LV s&', lateral 7.2 cm/s --------- LV e&', medial 7.24 cm/s --------- LV E/e&', medial 16.3 --------- LV e&', average 6.62 cm/s --------- LV E/e&', average 17.82 ---------  Ventricular septum Value Reference IVS thickness, ED 11 mm ---------  LVOT Value Reference LVOT ID, S 24 mm --------- LVOT area 4.52 cm^2 ---------  Aortic valve Value Reference Aortic valve peak velocity, S 415 cm/s --------- Aortic valve mean velocity, S 280 cm/s --------- Aortic valve VTI, S 83 cm --------- Aortic mean gradient, S 36 mm Hg --------- Aortic peak gradient, S 69 mm Hg ---------  Aorta Value Reference Aortic root ID, ED 32 mm --------- Ascending aorta ID, A-P, S 33 mm ---------  Left atrium Value Reference LA ID, A-P, ES 40 mm --------- LA ID/bsa, A-P 1.76 cm/m^2 <=2.2 LA volume, S 105 ml --------- LA volume/bsa, S 46.2 ml/m^2 --------- LA volume, ES, 1-p A4C 96 ml --------- LA volume/bsa, ES, 1-p A4C 42.3 ml/m^2 --------- LA volume, ES, 1-p A2C 107 ml --------- LA volume/bsa, ES,  1-p A2C 47.1 ml/m^2 ---------  Mitral valve Value Reference Mitral E-wave peak velocity 118 cm/s --------- Mitral A-wave peak velocity 154 cm/s --------- Mitral deceleration time 194 ms 150 - 230 Mitral peak gradient, D 6 mm Hg --------- Mitral E/A ratio, peak 0.8 ---------  Systemic veins Value Reference Estimated CVP 3 mm Hg ---------  Right ventricle Value Reference RV s&', lateral, S 13 cm/s ---------  Legend: (L) and (H) mark values outside specified reference range.  ------------------------------------------------------------------- Prepared and Electronically Authenticated by  Darlin Coco, MD 2017-01-16T14:56:10   CARDIAC CATHETERIZATION Procedures    Right/Left Heart Cath and Coronary Angiography    Conclusion    1. No angiographic evidence of CAD 2. Severe aortic valve stenosis (mean gradient 33 mmHg, peak gradient 39 mmHg, AVA 0.95 cm2).  3. Elevated troponin likely due to demand ischemia with severe AS and acute COPD exacerbation.   Recommendations: He has no evidence of CAD or plaque rupture. Elevated troponin likely due to demand ischemia given aortic stenosis and acute COPD exacerbation. Will hydrate overnight. Consider outpatient referral to CT surgery once COPD exacerbation resolved to discuss AVR.     Indications    NSTEMI (non-ST elevated myocardial infarction) ([place]) [I21.4 (ICD-10-CM)]   Aortic stenosis [I35.0 (ICD-10-CM)]    Technique and Indications    Estimated blood loss <50 mL. Indication: 70 yo male with history of atrial fibrillation, COPD admitted with COPD exacerbation. Troponin mildly elevated. Pt found to have severe aortic valve stenosis during this  admission.   Procedure: The risks, benefits, complications, treatment options, and expected outcomes were discussed with the patient. The patient and/or family concurred with the proposed plan, giving informed consent. The patient was brought to the cath lab after IV hydration was begun and oral premedication was given. The patient was further sedated with Versed and Fentanyl. The right groin was prepped and draped in the usual manner. Using the modified Seldinger access technique, a 5 French sheath was placed in the right femoral artery and a 7 French sheath was placed in the right femoral vein. A balloon tipped catheter was used to perform a right heart catheterization. Standard diagnostic catheters were used to perform selective coronary angiography. I crossed the aortic valve with a JR4 catheter and straight wire.  No left ventricular angiogram was performed.   There were no immediate complications. The patient was taken to the recovery area in stable condition.    Coronary Findings    Dominance: Right   Left Anterior Descending  . Vessel is large. Vessel is angiographically normal.   . First Diagonal Branch   The vessel is small in size.   . First Septal Branch   The vessel is small in size.     Left Circumflex  . Vessel is moderate in size.   . First Obtuse Marginal Branch   The vessel is moderate in size.   . Third Obtuse Marginal Branch   The vessel is small in size.     Right Coronary Artery  . Vessel is moderate in size. Vessel is angiographically normal.   . Right Posterior Descending Artery   The vessel is small in size.       Right Heart Pressures LV EDP is normal.    Left Heart    Aortic Valve There is severe aortic valve stenosis.    Coronary Diagrams    Diagnostic Diagram            Implants     No implant documentation for this case.    PACS Images    Show images for Cardiac catheterization     Link to Procedure Log    Procedure Log        Hemo Data       Most Recent Value   Fick Cardiac Output  6.4 L/min   Fick Cardiac Output Index  3.03 (L/min)/BSA   Aortic Mean Gradient  33.1 mmHg   Aortic Peak Gradient  39 mmHg   Aortic Valve Area  0.95   Aortic Value Area Index  0.45 cm2/BSA   RA A Wave  8 mmHg   RA V Wave  8 mmHg   RA Mean  7 mmHg   RV Systolic Pressure  33 mmHg   RV Diastolic Pressure  5 mmHg   RV EDP  10 mmHg   PA Systolic Pressure  26 mmHg   PA Diastolic Pressure  12 mmHg   PA Mean  14 mmHg   PW A Wave  12 mmHg   PW V Wave  10 mmHg   PW Mean  10 mmHg   AO Systolic Pressure  123XX123 mmHg   AO Diastolic Pressure  68 mmHg   AO Mean  87 mmHg   LV Systolic Pressure  [id number] mmHg   LV Diastolic Pressure  13 mmHg   LV EDP  17 mmHg   Arterial Occlusion Pressure Extended Systolic Pressure  123456 mmHg   Arterial Occlusion Pressure Extended Diastolic Pressure  62 mmHg   Arterial Occlusion Pressure Extended Mean Pressure  86 mmHg   Left Ventricular Apex Extended Systolic Pressure  0000000 mmHg   Left Ventricular Apex Extended Diastolic Pressure  11 mmHg   Left Ventricular Apex Extended EDP Pressure  16 mmHg   QP/QS  1   TPVR Index  4.61 HRUI   TSVR Index  28.69 HRUI   PVR SVR Ratio  0.05   TPVR/TSVR Ratio  0.16     Cardiac TAVR CT  TECHNIQUE: The patient was scanned on a Philips 256 scanner. A 120 kV retrospective scan was triggered in the descending thoracic aorta at 111 HU's. Gantry rotation speed was 270 msecs and collimation was .9 mm. No beta blockade or nitro were given. The 3D data set was reconstructed in  5% intervals of the R-R cycle. Systolic and diastolic phases were analyzed on a dedicated work station using MPR, MIP and VRT modes. The patient received 80 cc of contrast.  FINDINGS: Aortic Valve: Trileaflet moderately calcified leaflets. Small area of calcification at the base of the right leaflet  Aorta: Mild calcification of the aortic arch No  aneurysm or debris  Sinotubular Junction: 32 mm  Ascending Thoracic Aorta: 34 mm  Aortic Arch: 28 mm  Descending Thoracic Aorta: 23 mm  Sinus of Valsalva Measurements:  Non-coronary: 35 mm  Right -coronary: 33 mm  Left -coronary: 34 mm  Coronary Artery Height above Annulus:  Left Main: 14 mm  Right Coronary: 18 mm  Virtual Basal Annulus Measurements:  Maximum/Minimum Diameter: 28 x 26 mm  Perimeter: 89 mm  Area: 618 mm2  Coronary Arteries: Sufficient height above annulus for delivery  Optimum Fluoroscopic Angle for Delivery: LAO 6 degrees Cranial 0 degrees  IMPRESSION: 1) Calcified tri-leaflet aortic valve with annulus of 618 mm2 suitable for 29 mm Sapien 3 valve  2) Sufficient coronary height for delivery  3) No aortic root pathology or aneurysm  4) Optimum angiographic angle for delivery LAO 6 degrees Cranial 0 degrees  5) No LAA thrombus  [place]    Pulmonary Function Tests  Baseline      Post-bronchodilator  FVC  1.92 L  (51% predicted) FVC  1.64 L  (43% predicted) FEV1  1.06 L  (38% predicted) FEV1  0.97 L  (35% predicted) FEF25-75 0.53 L  (25% predicted) FEF25-75 0.33 L  (15% predicted)  TLC  7.75 L  (92% predicted) RV  3.97 L  (176% predicted) DLCO  54% predicted    STS Risk Calculator  Procedure    AVR  Risk of Mortality   2.8% Morbidity or Mortality  19.4% Prolonged LOS   8.6% Short LOS    30.3% Permanent Stroke   1.0% Prolonged Vent Support  13.0% DSW Infection    0.5% Renal Failure    4.3% Reoperation    8.0%    Impression:  The patient has stage D severe symptomatic aortic stenosis. He has a long history of progressive symptoms of shortness of breath that are multifactorial but clearly include symptoms of chronic diastolic congestive heart failure.  He was hospitalized in January with an acute exacerbation of chronic respiratory failure with findings consistent with class IV  congestive heart failure and COPD exacerbation. At present the patient describes symptoms consistent with class III CHF. I have personally reviewed the patient's most recent echocardiogram, diagnostic cardiac catheterization, and CT angiograms. Transthoracic echocardiogram confirmed the presence of severe aortic stenosis with peak velocity across the aortic valve measured greater than 4 m/s. There is severe thickening with restricted leaflet mobility and calcification involving all 3 leaflets of the patient's aortic valve. Left ventricular systolic function remains preserved. Diagnostic cardiac catheterization confirms the presence of severe aortic stenosis and reveals nonobstructive coronary artery disease. Risks associated with conventional surgical aortic valve replacement would be at least moderately elevated because of the patient's age, severe COPD, obesity, and recurrent GI bleeding episodes. Cardiac gated CT angiogram of the heart reveals findings consistent with severe aortic stenosis with anatomical characteristics suitable for transcatheter aortic valve replacement without any significant complicating features.  CT angiogram of the aortic and iliac vessels has not been officially reported but demonstrates findings consistent with what appears to be adequate pelvic vascular access for TAVR via transfemoral approach.  The patient might also be considered a  candidate for Watchman device because of his numerous episodes of recurrent GI bleeding on warfarin therapy for atrial flutter.   Plan:  The patient and his wife were counseled at length regarding treatment alternatives for management of severe symptomatic aortic stenosis. Alternative approaches such as conventional aortic valve replacement, transcatheter aortic valve replacement, and palliative medical therapy were compared and contrasted at length.  The risks associated with conventional surgical aortic valve replacement were been discussed in  detail, as were expectations for post-operative convalescence following both conventional surgery and TAVR. Long-term prognosis with medical therapy was discussed. This discussion was placed in the context of the patient's own specific clinical presentation and past medical history.  Questions about the long-term durability of TAVR were discussed.  All of their questions been addressed.  The patient desires to proceed with TAVR as an alternative to moderate risk conventional surgical aortic valve replacement in the near future.  He has tentatively been scheduled for surgery on Tuesday, 07/08/2015.  The patient has been instructed to stop taking warfarin 5 days prior to surgery.  Following the decision to proceed with transcatheter aortic valve replacement, a discussion has been held regarding what types of management strategies would be attempted intraoperatively in the event of life-threatening complications, including whether or not the patient would be considered a candidate for the use of cardiopulmonary bypass and/or conversion to open sternotomy for attempted surgical intervention.  The patient has been advised of a variety of complications that might develop including but not limited to risks of death, stroke, paravalvular leak, aortic dissection or other major vascular complications, aortic annulus rupture, device embolization, cardiac rupture or perforation, mitral regurgitation, acute myocardial infarction, arrhythmia, heart block or bradycardia requiring permanent pacemaker placement, congestive heart failure, respiratory failure, renal failure, pneumonia, infection, other late complications related to structural valve deterioration or migration, or other complications that might ultimately cause a temporary or permanent loss of functional independence or other long term morbidity.  The patient provides full informed consent for the procedure as described and all questions were  answered.       Valentina Gu. Roxy Manns, MD 06/12/2015 3:11 PM

## 2015-06-12 NOTE — Patient Instructions (Signed)
Patient has been instructed to stop taking Coumadin (warfarin) 5 days prior to surgery  Patient should continue taking all other medications without change through the day before surgery.  Patient should have nothing to eat or drink after midnight the night before surgery.  On the morning of surgery patient should take only Coreg and Protonix with a sip of water.

## 2015-06-13 ENCOUNTER — Other Ambulatory Visit: Payer: Self-pay | Admitting: *Deleted

## 2015-06-13 DIAGNOSIS — I350 Nonrheumatic aortic (valve) stenosis: Secondary | ICD-10-CM

## 2015-06-16 ENCOUNTER — Ambulatory Visit: Payer: Medicare PPO | Attending: Surgery | Admitting: Physical Therapy

## 2015-06-16 ENCOUNTER — Encounter: Payer: Self-pay | Admitting: Physical Therapy

## 2015-06-16 DIAGNOSIS — R293 Abnormal posture: Secondary | ICD-10-CM | POA: Diagnosis present

## 2015-06-16 DIAGNOSIS — M6281 Muscle weakness (generalized): Secondary | ICD-10-CM | POA: Insufficient documentation

## 2015-06-16 DIAGNOSIS — R262 Difficulty in walking, not elsewhere classified: Secondary | ICD-10-CM | POA: Diagnosis not present

## 2015-06-16 NOTE — Therapy (Signed)
[place], [place], [address] Phone: [phone]   Fax:  [phone]  Physical Therapy Evaluation  Patient Details  Name: Ryan Shea MRN: [id number] Date of Birth: 1945/07/15 Referring Provider: Dr. Gilford Raid  Encounter Date: 06/16/2015      PT End of Session - 06/16/15 1446    Visit Number 1   PT Start Time [id number]   PT Stop Time 1350   PT Time Calculation (min) 45 min      Past Medical History  Diagnosis Date  . Asthma   . Hypertension   . COPD (chronic obstructive pulmonary disease) ([place])   . Cancer [facility])     Prostate   . Helicobacter pylori gastritis MAR 2016  . Multiple duodenal ulcers MAR 2016  . Atrial flutter ([place])   . Aortic valve stenosis   . Cardiomyopathy ([place])   . Mitral regurgitation   . Gout     Past Surgical History  Procedure Laterality Date  . Prostate surgery    . Hernia repair    . Esophagogastroduodenoscopy N/A 05/31/2014    H PYLORI GASTRITIS, DUODENAL ULCERS  . Colonoscopy    . Colonoscopy N/A 08/25/2014    Rehman:examination performed to cecum. moderate number of diverticula at sigmoid colon without stigmata of bleed small external hemorrhoids and anal papillae  . Cardiac catheterization N/A 03/26/2015    Procedure: Right/Left Heart Cath and Coronary Angiography;  Surgeon: Burnell Blanks, MD;  Location: [place] [facility] LAB;  Service: Cardiovascular;  Laterality: N/A;  . Givens capsule study N/A 04/18/2015    Procedure: GIVENS CAPSULE STUDY;  Surgeon: Danie Binder, MD;  Location: [facility];  Service: Endoscopy;  Laterality: N/A;    There were no vitals filed for this visit.       Subjective Assessment - 06/16/15 1508    Subjective exertional SOB for about 2 years now and has continued to progress and has significantly reduced his mobility within the last year, also reports overall fatigue but denies chest pain, dizziness or leg pain   Patient  Stated Goals improve overall mobility   Currently in Pain? No/denies            [facility] PT Assessment - 06/16/15 0001    Assessment   Medical Diagnosis severe aortic stenosis   Referring Provider Dr. Gilford Raid   Onset Date/Surgical Date 06/12/15   Precautions   Precautions None   Restrictions   Weight Bearing Restrictions No   Balance Screen   Has the patient fallen in the past 6 months No   Has the patient had a decrease in activity level because of a fear of falling?  No   Is the patient reluctant to leave their home because of a fear of falling?  No   Home Environment   Living Environment Private residence   Living Arrangements Spouse/significant other   Home Access Level entry   [place] Tub bench   Prior Function   Level of [place]  no longer able to garden or hunting   Posture/Postural Control   Posture/Postural Control Postural limitations   Postural Limitations Forward head;Rounded Shoulders   ROM / Strength   AROM / PROM / Strength AROM;Strength   AROM   Overall AROM Comments grossly WNL throughout   Strength   Overall Strength Comments grossly 4-5/5 with hips the weaknest muscle group   Strength Assessment Site Hand   Right/Left  hand Right;Left   Right Hand Grip (lbs) 56  R hand dominant   Left Hand Grip (lbs) 45          [facility] Pre-Surgical Assessment - 06/16/15 0001    5 Meter Walk Test- trial 1 5 sec   5 Meter Walk Test- trial 2 4 sec.    5 Meter Walk Test- trial 3 5 sec.  </= 6 sec WNL, SOB rated 3/10 by patient   5 meter walk test average 4.67 sec   Timed Up & Go Test trial  10 sec.   Comments </= 12 sec WNL   4 Stage Balance Test tolerated for:  10 sec.   4 Stage Balance Test Position 4   comment not indicative of high fall risk   Sit To Stand Test- trial 1 12 sec.   Comment </= 12.6 sec WNL   ADL/IADL Independent with: Bathing;Dressing;Finances   ADL/IADL Needs Assistance with: Yard work;Meal  prep   ADL/IADL Fraility Index Vulnerable   6 Minute Walk- Baseline yes   BP (mmHg) 162/82 mmHg   HR (bpm) 82   02 Sat (%RA) 97 %   Modified Borg Scale for Dyspnea 0- Nothing at all   Perceived Rate of Exertion (Borg) 6-   6 Minute Walk Post Test yes   BP (mmHg) (!) 186/98 mmHg  recovered to baseline after 5 minutes   HR (bpm) 103   02 Sat (%RA) 90 %   Modified Borg Scale for Dyspnea 5- Strong or hard breathing   Perceived Rate of Exertion (Borg) 13- Somewhat hard   Aerobic Endurance Distance Walked 555   Endurance additional comments Pt ambulated about 2:00 and 370' before requiring a rest break due to 7/10 SOB, O2 89% and HR 110 bpm. It took 45 seconds for O2 to recover to >90% and pt was able to resume walking after 2 minutes. Pt able to ambulate another minute before stopping the test at 5:00 with vital as noted above.                                      Plan - 06/16/15 1555    Clinical Impression Statement Pt is a 70 yo male presenting to OP PT for evaluation prior to likely TAVR surgery scheduled in May 2017 due to severe aortic stenosis. Pt is present with wife and daughter for PT eval. Pt and family report that pt has had at least a 2 year hx of exertional SOB and it has progressively worsened and became activity limiting over the past year. Pt can no longer hunt and fish or garden like he used to. Pt presents with ROM WNL, good muscle strength although he tested mildly weak in his hips, good balance and walking speed. He did not score at a high fall risk. Pt was primarily limited with aerobic endurance in 6 minute walk test. Pt was able to ambulate 370' in 2 minutes and this resulted in 7/10 SOB, 89% O2 and 110 bpm. It took 45 seconds for O2 to return to above 90% and 2 minutes for patient ro resume walking. He was then able to ambulate an additional minute (185') before requiring another seated rest due to SOB. Pre BP was 162/82 and post BP was 186/98.  Pt's BP retuend to baseline after 5 minute seated rest.    PT Frequency One time visit   Consulted and Agree with  Plan of Care Patient;Family member/caregiver      Patient was found to have the following deficits and impairments:     Visit Diagnosis: Difficulty in walking, not elsewhere classified - Plan: PT plan of care cert/re-cert  Muscle weakness (generalized) - Plan: PT plan of care cert/re-cert  Abnormal posture - Plan: PT plan of care cert/re-cert      G-Codes - [id number]    Functional Assessment Tool Used 6 minute walk 555'   Functional Limitation Mobility: Walking and moving around   Mobility: Walking and Moving Around Current Status [phone]) At least 40 percent but less than 60 percent impaired, limited or restricted   Mobility: Walking and Moving Around Goal Status [phone]) At least 40 percent but less than 60 percent impaired, limited or restricted   Mobility: Walking and Moving Around Discharge Status [phone]) At least 40 percent but less than 60 percent impaired, limited or restricted       Problem List Patient Active Problem List   Diagnosis Date Noted  . Blood in stool   . NSTEMI (non-ST elevated myocardial infarction) ([place]) 03/22/2015  . CKD (chronic kidney disease) stage 3, GFR 30-59 ml/min 03/22/2015  . Morbid obesity ([place]) 03/22/2015  . Elevated troponin   . Acute renal failure ([place]) 08/24/2014  . Chronic anticoagulation 08/24/2014  . GI bleed 08/23/2014  . Anemia 08/23/2014  . Rectal bleeding 08/23/2014  . Colon cancer screening 07/11/2014  . Multiple duodenal ulcers 05/30/2014  . Aortic stenosis 04/23/2013  . Mitral regurgitation 04/23/2013  . Cardiomyopathy ([place]) 04/23/2013  . Paroxysmal atrial flutter ([place]) 04/23/2013  . HTN (hypertension) 04/20/2013  . High cholesterol 04/20/2013  . COPD (chronic obstructive pulmonary disease) ([place]) 04/20/2013  . Prostate cancer ([place]) 04/20/2013  . Backache, unspecified 04/20/2013  . Body mass index  40.0-44.9, adult ([place]) 04/20/2013    Cody, PT 06/16/2015, 4:13 PM  [place], [place], [address] Phone: [phone]   Fax:  [phone]  Name: Ryan Shea MRN: [id number] Date of Birth: Jun 13, 1945

## 2015-06-25 ENCOUNTER — Other Ambulatory Visit (HOSPITAL_COMMUNITY): Payer: Medicare PPO

## 2015-06-26 ENCOUNTER — Ambulatory Visit (HOSPITAL_COMMUNITY): Payer: Medicare PPO | Admitting: Hematology & Oncology

## 2015-06-26 LAB — PROTIME-INR: INR: 1.1 (ref 0.9–1.1)

## 2015-06-26 NOTE — Progress Notes (Signed)
This encounter was created in error - please disregard.

## 2015-06-30 LAB — PROTIME-INR: INR: 1.4 — AB (ref 0.9–1.1)

## 2015-07-02 ENCOUNTER — Encounter: Payer: Self-pay | Admitting: Surgery

## 2015-07-02 ENCOUNTER — Ambulatory Visit (INDEPENDENT_AMBULATORY_CARE_PROVIDER_SITE_OTHER): Payer: Medicare PPO | Admitting: Surgery

## 2015-07-02 VITALS — BP 147/84 | HR 92 | Resp 20 | Ht 66.0 in | Wt 142.0 lb

## 2015-07-02 DIAGNOSIS — I350 Nonrheumatic aortic (valve) stenosis: Secondary | ICD-10-CM

## 2015-07-02 NOTE — Progress Notes (Signed)
HPI:  The patient returns for follow up of his severe aortic stenosis. I initially saw him on 05/24/2015 and his STS PROM for open surgical AVR was 2.7%. I thought that he was probably in the low risk group for open surgery but his PFT's had not been done yet and these show severe obstruction with an FEV1 of 1.06 (38% predicted) and a severe diffusion defect with a DLCO of 54% predicted. He had a long history of heavy smoking but quit 30 years ago but has been exposed to second hand smoke ever since from his family. He continues to have exertional shortness of breath, fatigue and leg edema.   Current Outpatient Prescriptions  Medication Sig Dispense Refill  . albuterol (PROVENTIL) (2.5 MG/3ML) 0.083% nebulizer solution Take 3 mLs (2.5 mg total) by nebulization every 4 (four) hours as needed for wheezing or shortness of breath. 75 mL 12  . allopurinol (ZYLOPRIM) 100 MG tablet Take 100 mg by mouth daily.     Marland Kitchen aspirin 81 MG chewable tablet Chew 1 tablet (81 mg total) by mouth daily. (Patient not taking: Reported on 07/02/2015) 30 tablet 0  . carvedilol (COREG) 6.25 MG tablet Take 1 tablet (6.25 mg total) by mouth 2 (two) times daily with a meal. 180 tablet 3  . colchicine 0.6 MG tablet Take 1 tablet (0.6 mg total) by mouth daily. 20 tablet 0  . ferrous sulfate 325 (65 FE) MG tablet Take 325 mg by mouth at bedtime.     . Fluticasone-Salmeterol (ADVAIR DISKUS) 250-50 MCG/DOSE AEPB Inhale 1 puff into the lungs daily. 60 each 1  . furosemide (LASIX) 40 MG tablet Take 1 tablet (40 mg total) by mouth daily as needed for fluid or edema. 30 tablet 0  . gabapentin (NEURONTIN) 600 MG tablet Take 300 mg by mouth 3 (three) times daily as needed (for pain/neuropathy).     . Iron Polysacch Cmplx-B12-FA 150-0.025-1 MG CAPS Take 1 capsule by mouth 2 (two) times daily. 60 each 0  . pantoprazole (PROTONIX) 40 MG tablet Take 1 tablet (40 mg total) by mouth daily. 60 tablet 5  . pravastatin (PRAVACHOL) 40 MG  tablet Take 1 tablet by mouth at bedtime.     . VENTOLIN HFA 108 (90 BASE) MCG/ACT inhaler Inhale 1 puff into the lungs every 6 (six) hours as needed for wheezing or shortness of breath. Reported on 07/02/2015    . warfarin (COUMADIN) 1 MG tablet Take 3.5 tablets (3.5 mg total) by mouth daily at 6 PM. (Patient not taking: Reported on 07/02/2015) 60 tablet 0  . zolpidem (AMBIEN) 10 MG tablet Take 0.5 tablets (5 mg total) by mouth at bedtime as needed for sleep. 30 tablet 0   No current facility-administered medications for this visit.     Physical Exam: BP 147/84 mmHg  Pulse 92  Resp 20  Ht 5\' 6"  (1.676 m)  Wt 142 lb (64.411 kg)  BMI 22.93 kg/m2  SpO2 96% He looks chronically ill but ambulatory and mentally sharp Cardiac exam shows a regular rate and rhythm with a 3/6 systolic murmur at the RSB Lungs are clear Mild bilateral ankle edema   Diagnostic Tests:  ADDENDUM REPORT: 06/11/2015 15:11 EXAM: OVER-READ INTERPRETATION CT CHEST The following report is an over-read performed by radiologist Dr. Norlene Duel [facility] on 06/11/2015. This over-read does not include interpretation of cardiac or coronary anatomy or pathology. The coronary calcification interpretation by the cardiologist is attached. COMPARISON: None FINDINGS: Mediastinum:  The visualized lower airway appears patent. The visualized portions of the esophagus are normal. No mediastinal or hilar adenopathy identified. No axillary adenopathy. Lungs/Pleura: Moderate changes of centrilobular emphysema identified. There is no pleural fluid identified. No suspicious nodule or mass noted. Upper Abdomen: Calcified granulomas identified within the liver. Musculoskeletal: Degenerative disc disease is noted within the thoracic spine. IMPRESSION: 1. Emphysema. 2. Prior granulomatous disease. Electronically Signed  By: Kerby Moors M.D.  On: 06/11/2015 15:11     Study Result     CLINICAL DATA:  Aortic stenosis  EXAM: Cardiac TAVR CT  TECHNIQUE: The patient was scanned on a Philips 256 scanner. A 120 kV retrospective scan was triggered in the descending thoracic aorta at 111 HU's. Gantry rotation speed was 270 msecs and collimation was .9 mm. No beta blockade or nitro were given. The 3D data set was reconstructed in 5% intervals of the R-R cycle. Systolic and diastolic phases were analyzed on a dedicated work station using MPR, MIP and VRT modes. The patient received 80 cc of contrast.  FINDINGS: Aortic Valve: Trileaflet moderately calcified leaflets. Small area of calcification at the base of the right leaflet  Aorta: Mild calcification of the aortic arch No aneurysm or debris  Sinotubular Junction: 32 mm  Ascending Thoracic Aorta: 34 mm  Aortic Arch: 28 mm  Descending Thoracic Aorta: 23 mm  Sinus of Valsalva Measurements:  Non-coronary: 35 mm  Right -coronary: 33 mm  Left -coronary: 34 mm  Coronary Artery Height above Annulus:  Left Main: 14 mm  Right Coronary: 18 mm  Virtual Basal Annulus Measurements:  Maximum/Minimum Diameter: 28 x 26 mm  Perimeter: 89 mm  Area: 618 mm2  Coronary Arteries: Sufficient height above annulus for delivery  Optimum Fluoroscopic Angle for Delivery: LAO 6 degrees Cranial 0 degrees  IMPRESSION: 1) Calcified tri-leaflet aortic valve with annulus of 618 mm2 suitable for 29 mm Sapien 3 valve  2) Sufficient coronary height for delivery  3) No aortic root pathology or aneurysm  4) Optimum angiographic angle for delivery LAO 6 degrees Cranial 0 degrees  5) No LAA thrombus  Jenkins Rouge  EXAM: Cardiac CTA  MEDICATIONS: Sub lingual nitro. 4mg  and lopressor mg  TECHNIQUE: The patient was scanned on a Philips [id number] slice scanner. Gantry rotation speed was 270 msecs. Collimation was .37mm. A 100 kV prospective scan was triggered in the descending thoracic aorta at 111  HU's with 5% padding centered around 78% of the R-R interval. Average HR during the scan was bpm. The 3D data set was interpreted on a dedicated work station using MPR, MIP and VRT modes. A total of 80cc of contrast was used.  FINDINGS: Non-cardiac: See separate report from [facility]. No significant findings on limited lung and soft tissue windows.  Calcium Score:  Coronary Arteries: Right dominant with no anomalies  LM:  LAD:  D1:  D2:  Circumflex:  OM1:  OM2:  RCA:  PDA:  PLA:  IMPRESSION: Jenkins Rouge  Electronically Signed: By: Jenkins Rouge M.D. On: 06/11/2015 13:49     CLINICAL DATA: 70 year old male with history of severe aortic stenosis. Preprocedural study prior to potential transcatheter aortic valve replacement (TAVR).  EXAM: CT ANGIOGRAPHY CHEST, ABDOMEN AND PELVIS  TECHNIQUE: Multidetector CT imaging through the chest, abdomen and pelvis was performed using the standard protocol during bolus administration of intravenous contrast. Multiplanar reconstructed images and MIPs were obtained and reviewed to evaluate the vascular anatomy.  CONTRAST: 70 mL of Isovue 370.  COMPARISON: None.  FINDINGS:  CTA CHEST FINDINGS  Mediastinum/Lymph Nodes: Heart size is normal. There is no significant pericardial fluid, thickening or pericardial calcification. There is atherosclerosis of the thoracic aorta, the great vessels of the mediastinum and the coronary arteries, including calcified atherosclerotic plaque in the left main and left anterior descending coronary arteries. Severe thickening calcification of the aortic valve. Mild calcifications of the mitral valve. No pathologically enlarged mediastinal or hilar lymph nodes. Esophagus is unremarkable in appearance. Heterogeneously enlarged thyroid gland with multiple nodules, the largest of which is in the anterior aspect of the right lobe of the gland  measuring approximately 1.9 x 2.6 cm (image 16 of series 501). No axillary lymphadenopathy.  Lungs/Pleura: In the medial aspect of the left lower lobe there are several clustered micronodules measuring up to 5 mm in size (image 97 of series 507), favored to be a related to areas of mucoid impaction within terminal bronchioles. No other more suspicious appearing pulmonary nodules or masses are noted. Mild diffuse bronchial wall thickening with mild centrilobular emphysema. No acute consolidative airspace disease. No pleural effusions.  Musculoskeletal/Soft Tissues: There are no aggressive appearing lytic or blastic lesions noted in the visualized portions of the skeleton.  CTA ABDOMEN AND PELVIS FINDINGS  Hepatobiliary: Several small calcified granulomas are noted in the liver. No suspicious cystic or solid hepatic lesions. No intra or extrahepatic biliary ductal dilatation. Gallbladder is normal in appearance.  Pancreas: No pancreatic mass. No pancreatic ductal dilatation. No pancreatic or peripancreatic fluid or inflammatory changes.  Spleen: Unremarkable.  Adrenals/Urinary Tract: Bilateral adrenal glands are normal in appearance. Multifocal cortical thinning in the kidneys bilaterally, indicative of mild post infectious or inflammatory scarring. Exophytic 5.6 cm low-attenuation lesion in the interpolar region of the left kidney is compatible with a simple cyst. No suspicious renal lesions. No hydroureteronephrosis. Urinary bladder is normal in appearance.  Stomach/Bowel: The appearance of the stomach is normal. There is no pathologic dilatation of small bowel or colon. Numerous colonic diverticulae are noted, particularly in the sigmoid colon, without surrounding inflammatory changes to suggest an acute diverticulitis at this time. Normal appendix.  Vascular/Lymphatic: Atherosclerosis throughout the abdominal and pelvic vasculature, without evidence of aneurysm  or dissection. Vascular findings and measurements pertinent to potential TAVR procedure, as detailed below. Single renal arteries bilaterally. No lymphadenopathy noted in the abdomen or pelvis.  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  Other: No significant volume of ascites. No pneumoperitoneum.  Musculoskeletal: There are no aggressive appearing lytic or blastic lesions noted in the visualized portions of the skeleton.  VASCULAR MEASUREMENTS PERTINENT TO TAVR:  AORTA:  Minimal Aortic Diameter - 11 x 13 mm  Severity of Aortic Calcification - moderate  RIGHT PELVIS:  Right Common Iliac Artery -  Minimal Diameter - 9.4 x 10.1 mm  Tortuosity - mild  Calcification - mild  Right External Iliac Artery -  Minimal Diameter - 8.6 x 6.3 mm  Tortuosity - mild  Calcification - none  Right Common Femoral Artery -  Minimal Diameter - 8.6 x 7.1 mm  Tortuosity - mild  Calcification - mild  LEFT PELVIS:  Left Common Iliac Artery -  Minimal Diameter - 9.6 x 8.2 mm  Tortuosity - mild  Calcification - mild  Left External Iliac Artery -  Minimal Diameter - 9.5 x 9.1 mm  Tortuosity - mild  Calcification - none  Left Common Femoral Artery -  Minimal Diameter - 8.9 x 7.3 mm  Tortuosity - mild  Calcification - mild  Review of  the MIP images confirms the above findings.  IMPRESSION: 1. Vascular findings and measurements pertinent to potential TAVR procedure, as detailed above. This patient appears to have suitable pelvic arterial access bilaterally. 2. Severe thickening calcification of the aortic valve, compatible with the reported clinical history of severe aortic stenosis. 3. Cluster of small nodules in the left lower lobe measuring 5 mm or less in size. No follow-up needed if patient is low-risk (and has no known or suspected primary neoplasm). Non-contrast chest CT can be considered in 12  months if patient is high-risk. This recommendation follows the consensus statement: Guidelines for Management of Incidental Pulmonary Nodules Detected on CT Images:From the Fleischner Society 2017; published online before print (10.1148/radiol.IJ:2314499). 4. Mild diffuse bronchial wall thickening with mild centrilobular emphysema; imaging findings suggestive of underlying COPD. 5. Colonic diverticulosis without evidence of acute diverticulitis at this time. 6. Additional incidental findings, as above.   Electronically Signed  By: Vinnie Langton M.D.  On: 06/16/2015 11:03  Impression:  He has severe symptomatic aortic stenosis with exertional shortness of breath and fatigue due to chronic diastolic heart failure, NYHA class III. I have personally reviewed his CT scans and he appears to be a candidate for a transfemoral TAVR. The patient and his wife were counseled at length regarding treatment alternatives for management of severe symptomatic aortic stenosis. Alternative approaches such as conventional aortic valve replacement, transcatheter aortic valve replacement, and palliative medical therapy were compared and contrasted at length. The risks associated with conventional surgical aortic valve replacement were been discussed in detail, as were expectations for post-operative convalescence following both conventional surgery and TAVR. Long-term prognosis with medical therapy was discussed.  All of their questions been addressed. The patient desires to proceed with TAVR as an alternative to moderate risk conventional surgical aortic valve replacement. He has stopped taking warfarin and aspirin as of yesterday for surgery.  Following the decision to proceed with transcatheter aortic valve replacement, a discussion has been held regarding what types of management strategies would be attempted intraoperatively in the event of life-threatening complications, including whether or not the  patient would be considered a candidate for the use of cardiopulmonary bypass and/or conversion to open sternotomy for attempted surgical intervention. The patient has been advised of a variety of complications that might develop including but not limited to risks of death, stroke, paravalvular leak, aortic dissection or other major vascular complications, aortic annulus rupture, device embolization, cardiac rupture or perforation, mitral regurgitation, acute myocardial infarction, arrhythmia, heart block or bradycardia requiring permanent pacemaker placement, congestive heart failure, respiratory failure, renal failure, pneumonia, infection, other late complications related to structural valve deterioration or migration, or other complications that might ultimately cause a temporary or permanent loss of functional independence or other long term morbidity. The patient provides full informed consent for the procedure as described and all questions were answered.   Plan:  Transfemoral TAVR on Tuesday, 07/08/2015.  Gaye Pollack, MD [facility] [phone]

## 2015-07-04 ENCOUNTER — Ambulatory Visit (HOSPITAL_COMMUNITY)
Admission: RE | Admit: 2015-07-04 | Discharge: 2015-07-04 | Disposition: A | Discharge status: Home / Self Care | Payer: Medicare PPO | Source: Ambulatory Visit | Attending: Cardiovascular Disease | Admitting: Cardiovascular Disease

## 2015-07-04 ENCOUNTER — Encounter (HOSPITAL_COMMUNITY)
Admission: RE | Admit: 2015-07-04 | Discharge: 2015-07-04 | Disposition: A | Discharge status: Home / Self Care | Payer: Medicare PPO | Source: Ambulatory Visit | Attending: Cardiovascular Disease | Admitting: Cardiovascular Disease

## 2015-07-04 VITALS — BP 134/56 | HR 92 | Temp 97.8°F | Resp 20 | Ht 66.0 in | Wt 245.4 lb

## 2015-07-04 DIAGNOSIS — Z01818 Encounter for other preprocedural examination: Secondary | ICD-10-CM | POA: Insufficient documentation

## 2015-07-04 DIAGNOSIS — Z01810 Encounter for preprocedural cardiovascular examination: Secondary | ICD-10-CM | POA: Insufficient documentation

## 2015-07-04 DIAGNOSIS — I350 Nonrheumatic aortic (valve) stenosis: Secondary | ICD-10-CM

## 2015-07-04 DIAGNOSIS — Z01812 Encounter for preprocedural laboratory examination: Secondary | ICD-10-CM | POA: Insufficient documentation

## 2015-07-04 DIAGNOSIS — R9431 Abnormal electrocardiogram [ECG] [EKG]: Secondary | ICD-10-CM | POA: Insufficient documentation

## 2015-07-04 LAB — URINALYSIS, ROUTINE W REFLEX MICROSCOPIC
Bilirubin Urine: NEGATIVE
Glucose, UA: NEGATIVE mg/dL
Hgb urine dipstick: NEGATIVE
KETONES UR: NEGATIVE mg/dL
LEUKOCYTES UA: NEGATIVE
NITRITE: NEGATIVE
Protein, ur: NEGATIVE mg/dL
Specific Gravity, Urine: 1.023 (ref 1.005–1.030)
pH: 5.5 (ref 5.0–8.0)

## 2015-07-04 LAB — COMPREHENSIVE METABOLIC PANEL
ALBUMIN: 3.4 g/dL — AB (ref 3.5–5.0)
ALT: 13 U/L — ABNORMAL LOW (ref 17–63)
ANION GAP: 13 (ref 5–15)
AST: 17 U/L (ref 15–41)
Alkaline Phosphatase: 64 U/L (ref 38–126)
BILIRUBIN TOTAL: 0.3 mg/dL (ref 0.3–1.2)
BUN: 14 mg/dL (ref 6–20)
CHLORIDE: 109 mmol/L (ref 101–111)
CO2: 20 mmol/L — ABNORMAL LOW (ref 22–32)
Calcium: 9.1 mg/dL (ref 8.9–10.3)
Creatinine, Ser: 1.04 mg/dL (ref 0.61–1.24)
GFR calc Af Amer: >60 mL/min (ref >60)
GLUCOSE: 116 mg/dL — AB (ref 65–99)
POTASSIUM: 4.1 mmol/L (ref 3.5–5.1)
Sodium: 142 mmol/L (ref 135–145)
TOTAL PROTEIN: 6.5 g/dL (ref 6.5–8.1)

## 2015-07-04 LAB — PROTIME-INR
INR: 1.19 (ref 0.00–1.49)
PROTHROMBIN TIME: 15.3 s — AB (ref 11.6–15.2)

## 2015-07-04 LAB — CBC
HEMATOCRIT: 39.4 % (ref 39.0–52.0)
Hemoglobin: 12.7 g/dL — ABNORMAL LOW (ref 13.0–17.0)
MCH: 28.7 pg (ref 26.0–34.0)
MCHC: 32.2 g/dL (ref 30.0–36.0)
MCV: 88.9 fL (ref 78.0–100.0)
PLATELETS: 199 10*3/uL (ref 150–400)
RBC: 4.43 MIL/uL (ref 4.22–5.81)
RDW: 14.5 % (ref 11.5–15.5)
WBC: 7.9 10*3/uL (ref 4.0–10.5)

## 2015-07-04 LAB — BLOOD GAS, ARTERIAL
ACID-BASE EXCESS: 2.7 mmol/L — AB (ref 0.0–2.0)
BICARBONATE: 27.1 meq/L — AB (ref 20.0–24.0)
Drawn by: 206361
FIO2: 0.21
O2 Saturation: 95.7 %
PATIENT TEMPERATURE: 98.6
TCO2: 28.4 mmol/L (ref 0–100)
pCO2 arterial: 44.4 mmHg (ref 35.0–45.0)
pH, Arterial: 7.403 (ref 7.350–7.450)
pO2, Arterial: 83.3 mmHg (ref 80.0–100.0)

## 2015-07-04 LAB — APTT: aPTT: 29 seconds (ref 24–37)

## 2015-07-04 LAB — TYPE AND SCREEN
ABO/RH(D): O POS
Antibody Screen: NEGATIVE

## 2015-07-04 LAB — SURGICAL PCR SCREEN
MRSA, PCR: NEGATIVE
STAPHYLOCOCCUS AUREUS: NEGATIVE

## 2015-07-04 MED ORDER — CHLORHEXIDINE GLUCONATE 4 % EX LIQD: 30.0000 mL | CUTANEOUS | Status: DC

## 2015-07-04 NOTE — Progress Notes (Signed)
This patient scored at an elevated risk for obstructive sleep apnea during a presurgical testing using the STOP BANG TOOL

## 2015-07-04 NOTE — Progress Notes (Signed)
Type and screen day of surgery, pt transfused 04/2015 at [facility]

## 2015-07-04 NOTE — Pre-Procedure Instructions (Addendum)
    Ryan Shea  07/04/2015      [facility] - [place], [place] [address] [place] [address] Phone: [phone] Fax: [phone]  [facility] [place], [address] [address] Phone: [phone] Fax: [place], [place] [address] [place] [address] Phone: [phone] Fax: [phone]    Your procedure is scheduled on Jul 08, 2015.  Report to [facility] Admitting at 5:30 A.M.  Call this number if you have problems the morning of surgery:  [phone]   Remember:  Do not eat food or drink liquids after midnight.  Take these medicines the morning of surgery with A SIP OF WATER :allopurinol (ZYLOPRIM),    colchicine, ADVAIR DISKUS, pantoprazole (PROTONIX)   STOP NSAID'S (ADVIL, IBUPROFEN), HERBAL MEDICATIONS   Do not wear jewelry.  Do not wear lotions, powders, or colognes.  You may NOT wear deodorant.  Men may shave face and neck only.  Do not bring valuables to the hospital.  [facility] is not responsible for any belongings or valuables.  Contacts, dentures or bridgework may not be worn into surgery.  Leave your suitcase in the car.  After surgery it may be brought to your room.  For patients admitted to the hospital, discharge time will be determined by your treatment team.   Name and phone number of your driver:    Special instructions:  "Preparing for surgery"  Please read over the following fact sheets that you were given. Pain Booklet, Coughing and Deep Breathing, Blood Transfusion Information and Surgical Site Infection Prevention

## 2015-07-05 LAB — HEMOGLOBIN A1C
HEMOGLOBIN A1C: 5.4 % (ref 4.8–5.6)
MEAN PLASMA GLUCOSE: 108 mg/dL

## 2015-07-07 MED ORDER — DEXTROSE 5 % IV SOLN
0.0000 ug/min | INTRAVENOUS | Status: DC
  Filled 2015-07-07: qty 4

## 2015-07-07 MED ORDER — NITROGLYCERIN IN D5W 200-5 MCG/ML-% IV SOLN
2.0000 ug/min | INTRAVENOUS | Status: DC
  Filled 2015-07-07: qty 250

## 2015-07-07 MED ORDER — NOREPINEPHRINE BITARTRATE 1 MG/ML IV SOLN
0.0000 ug/min | INTRAVENOUS | Status: AC
  Administered 2015-07-08: 2 ug/min via INTRAVENOUS
  Filled 2015-07-07: qty 4

## 2015-07-07 MED ORDER — DEXMEDETOMIDINE HCL IN NACL 400 MCG/100ML IV SOLN
0.1000 ug/kg/h | INTRAVENOUS | Status: AC
  Administered 2015-07-08: .4 ug/kg/h via INTRAVENOUS
  Filled 2015-07-07: qty 100

## 2015-07-07 MED ORDER — VANCOMYCIN HCL 10 G IV SOLR
1500.0000 mg | INTRAVENOUS | Status: AC
  Administered 2015-07-08: 1500 mg via INTRAVENOUS
  Filled 2015-07-07: qty 1500

## 2015-07-07 MED ORDER — DOPAMINE-DEXTROSE 3.2-5 MG/ML-% IV SOLN
0.0000 ug/kg/min | INTRAVENOUS | Status: DC
  Filled 2015-07-07: qty 250

## 2015-07-07 MED ORDER — DEXTROSE 5 % IV SOLN
1.5000 g | INTRAVENOUS | Status: DC
  Administered 2015-07-08: 1.5 g via INTRAVENOUS
  Filled 2015-07-07: qty 1.5

## 2015-07-07 MED ORDER — INSULIN REGULAR HUMAN 100 UNIT/ML IJ SOLN
INTRAMUSCULAR | Status: DC
  Filled 2015-07-07: qty 2.5

## 2015-07-07 MED ORDER — POTASSIUM CHLORIDE 2 MEQ/ML IV SOLN
80.0000 meq | INTRAVENOUS | Status: DC
  Filled 2015-07-07: qty 40

## 2015-07-07 MED ORDER — DEXTROSE 5 % IV SOLN
30.0000 ug/min | INTRAVENOUS | Status: DC
  Filled 2015-07-07: qty 2

## 2015-07-07 MED ORDER — MAGNESIUM SULFATE 50 % IJ SOLN
40.0000 meq | INTRAMUSCULAR | Status: DC
  Filled 2015-07-07: qty 10

## 2015-07-07 MED ORDER — SODIUM CHLORIDE 0.9 % IV SOLN
INTRAVENOUS | Status: DC
  Filled 2015-07-07: qty 30

## 2015-07-07 NOTE — Progress Notes (Signed)
Anesthesia Chart Review: Patient is a 70 year old male scheduled for TAVR, transfemoral approach on 07/08/15 by Dr. Angelena Form.  History includes severe AS, paroxysmal afib/flutter, NSTEMI (type II 03/2015 in the setting of PNA), HTN, HLD, severe COPD, remote former smoker (ongiong second hand smoke exposure), asthma, GI bleed with duodenal ulcers (GI Dr. Oneida Alar), normocytic anemia (hematologist is Dr. Whitney Muse), prostate cancer, hernia repair. Admission for GI bleed HGB 6.3 s/p transfusion 2/9-2/11/17. PCP is Dr. Holly Bodily. Cardiologist is Dr. Bronson Ing.  Meds include albuterol, allopurinol, Coreg, colchicine, 65 Fe, Advair, Lasix, Neurontin, Protonix, pravastatin, warfarin (on hold since 07/01/15), Ambien.  03/24/15 Echo: Study Conclusions - Left ventricle: The cavity size was normal. There was mild concentric hypertrophy. Systolic function was normal. The estimated ejection fraction was in the range of 55% to 60%. Wall motion was normal; there were no regional wall motion abnormalities. Doppler parameters are consistent with abnormal left ventricular relaxation (grade 1 diastolic dysfunction). - Aortic valve: There was severe stenosis. Mean gradient (S): 36 mm Hg. Peak gradient (S): 69 mm Hg.  03/26/15 Cardiac cath: 1. No angiographic evidence of CAD 2. Severe aortic valve stenosis (mean gradient 33 mmHg, peak gradient 39 mmHg, AVA 0.95 cm2).  3. Elevated troponin likely due to demand ischemia with severe AS and acute COPD exacerbation.  Recommendations: He has no evidence of CAD or plaque rupture. Elevated troponin likely due to demand ischemia given aortic stenosis and acute COPD exacerbation. Will hydrate overnight. Consider outpatient referral to CT surgery once COPD exacerbation resolved to discuss AVR.   Preoperative EKG, cardiac CT, CTA chest/abd/pelvis, and CXR noted.   06/04/15 PFTs:  Baseline     Post-bronchodilator FVC  1.92 L (51% predicted) FVC1.64 L (43%  predicted) FEV1 1.06 L (38% predicted) FEV10.97 L (35% predicted) FEF25-75 0.53 L (25% predicted) FEF25-750.33 L (15% predicted) TLC 7.75 L (92% predicted) RV 3.97 L (176% predicted) DLCO 54% predicted  Preoperative labs noted. H/H 12.7/39.4. Cr 1.04. INR 1.19. A1c 5.4. He needs another T&S on the day of surgery (had transfusion 04/2015 at [facility]).  If no acute changes then I anticipate that he can proceed as planned.  George Hugh [facility]/Anesthesiology Phone [phone] 07/07/2015 10:10 AM

## 2015-07-07 NOTE — H&P (Signed)
[address]       [place] [address]             [phone]       Cardiothoracic Surgery History and Physical    PCP is PETERSON, Linden Dolin, MD Referring Provider is Lendon Colonel, NP  Chief Complaint  Patient presents with  . Aortic Stenosis    Surgical eval, Cardiac Cath 03/26/15, ECHO 03/24/15    HPI:  The patient is a 70 year old gentleman with a history of COPD, atrial flutter, hypertension, hyperlipidemia, stage 3 CKD, and GI blood loss with a history of duodenal ulcers and H. Pylori gastritis and chronic anemia who was admitted in January with acute respiratory failure and chest pain. He ruled in for a NSTEMI felt to be due to demand ischemia in the setting of pneumonia and respiratory failure. An echo on 03/24/2015 showed severe AS with a peak velocity of 415 cm/s and a mean gradient of 36 mm Hg. He underwent cardiac cath on 03/26/2015 showing no significant coronary disease and severe AS with a mean gradient of 33 mm Hg and an AVA of 0.95 cm2.  He has been admitted to the hospital several times for acute GI blood loss and has received 6 units of blood over the past year. He had an EGD in 05/2014 showing two duodenal ulcers and H. Pylori gastritis. He had a capsule endoscopy 04/2014 that showed occasional small bowel erosions without active bleeding. Colonoscopy in 08/2014 showed diverticular disease and small hemorrhoids without evidence of bleeding. He saw Dr. Whitney Muse from Heme/Onc who felt that his anemia was multifactorial likely from ongoing GI blood loss as well as from hypogonadism.  I initially saw him on 05/24/2015 and his STS PROM for open surgical AVR was 2.7%. I thought that he was probably in the low risk group for open surgery but his PFT's had not been done yet and these show severe obstruction with an FEV1 of 1.06 (38% predicted) and a severe diffusion defect with a DLCO of 54% predicted. He had a long history of heavy smoking but  quit 30 years ago but has been exposed to second hand smoke ever since from his family. He continues to have exertional shortness of breath, fatigue and leg edema.   Past Medical History  Diagnosis Date  . Asthma   . Hypertension   . COPD (chronic obstructive pulmonary disease) ([place])   . Cancer [facility])     Prostate   . Helicobacter pylori gastritis MAR 2016  . Multiple duodenal ulcers MAR 2016  . Atrial flutter ([place])   . Aortic valve stenosis   . Cardiomyopathy ([place])   . Mitral regurgitation   . Gout     Past Surgical History  Procedure Laterality Date  . Prostate surgery    . Hernia repair    . Esophagogastroduodenoscopy N/A 05/31/2014    H PYLORI GASTRITIS, DUODENAL ULCERS  . Colonoscopy    . Colonoscopy N/A 08/25/2014    Rehman:examination performed to cecum. moderate number of diverticula at sigmoid colon without stigmata of bleed small external hemorrhoids and anal papillae  . Cardiac catheterization N/A 03/26/2015    Procedure: Right/Left Heart Cath and Coronary Angiography; Surgeon: Burnell Blanks, MD; Location: [place] [facility] LAB; Service: Cardiovascular; Laterality: N/A;  . Givens capsule study N/A 04/18/2015    Procedure: GIVENS CAPSULE STUDY; Surgeon: Danie Binder, MD; Location: [facility]; Service: Endoscopy; Laterality: N/A;  Family History  Problem Relation Age of Onset  . Stroke Mother   . Cancer Brother   . Cancer Other   . Colon cancer Neg Hx   . Colon polyps Neg Hx     Social History Social History  Substance Use Topics  . Smoking status: Former Smoker -- 2.00 packs/day for 20 years    Types: Cigarettes    Start date: 09/01/1959    Quit date: 09/01/1978  . Smokeless tobacco: Former Systems developer     Comment: quit 30 years ago  . Alcohol Use: No    Current Outpatient Prescriptions  Medication  Sig Dispense Refill  . albuterol (PROVENTIL) (2.5 MG/3ML) 0.083% nebulizer solution Take 3 mLs (2.5 mg total) by nebulization every 4 (four) hours as needed for wheezing or shortness of breath. 75 mL 12  . allopurinol (ZYLOPRIM) 100 MG tablet Take 1 tablet by mouth daily.    Marland Kitchen aspirin 81 MG chewable tablet Chew 1 tablet (81 mg total) by mouth daily. 30 tablet 0  . carvedilol (COREG) 6.25 MG tablet Take 1 tablet (6.25 mg total) by mouth 2 (two) times daily with a meal. 180 tablet 3  . colchicine 0.6 MG tablet Take 1 tablet (0.6 mg total) by mouth daily. 20 tablet 0  . ferrous sulfate 325 (65 FE) MG tablet Take 325 mg by mouth at bedtime.     . Fluticasone-Salmeterol (ADVAIR DISKUS) 250-50 MCG/DOSE AEPB Inhale 1 puff into the lungs daily. 60 each 1  . furosemide (LASIX) 40 MG tablet Take 1 tablet (40 mg total) by mouth daily as needed for fluid or edema. 30 tablet 0  . gabapentin (NEURONTIN) 300 MG capsule Take 300 mg by mouth 3 (three) times daily as needed (for pain/neuropathy).     . gabapentin (NEURONTIN) 600 MG tablet     . Iron Polysacch Cmplx-B12-FA 150-0.025-1 MG CAPS Take 1 capsule by mouth 2 (two) times daily. 60 each 0  . pantoprazole (PROTONIX) 40 MG tablet Take 1 tablet (40 mg total) by mouth daily. 60 tablet 5  . pravastatin (PRAVACHOL) 40 MG tablet Take 1 tablet by mouth at bedtime.     Marland Kitchen tiotropium (SPIRIVA HANDIHALER) 18 MCG inhalation capsule Place 1 capsule (18 mcg total) into inhaler and inhale daily. 30 capsule 12  . VENTOLIN HFA 108 (90 BASE) MCG/ACT inhaler Inhale 1 puff into the lungs every 6 (six) hours as needed for wheezing or shortness of breath.     . warfarin (COUMADIN) 1 MG tablet Take 3.5 tablets (3.5 mg total) by mouth daily at 6 PM. (Patient taking differently: Take 2.5 mg by mouth daily at 6 PM. HOLD CRITICAL LOW HGB 04/17/15) 60 tablet 0  . zolpidem (AMBIEN) 10 MG tablet Take 0.5  tablets (5 mg total) by mouth at bedtime as needed for sleep. 30 tablet 0   No current facility-administered medications for this visit.    No Known Allergies  Review of Systems  Constitutional: Positive for fever, activity change and fatigue. Negative for chills and unexpected weight change.  HENT: Negative. Negative for dental problem.  Eyes: Negative.  Respiratory: Positive for shortness of breath.  Cardiovascular: Positive for palpitations and leg swelling. Negative for chest pain.  Gastrointestinal: Negative.  Endocrine: Negative.  Genitourinary: Negative.  Musculoskeletal: Negative.  Skin: Negative.  Allergic/Immunologic: Negative.  Neurological: Negative.  Hematological: Bruises/bleeds easily.  Psychiatric/Behavioral: Negative.    BP 140/70 mmHg  Pulse 94  Resp 20  Ht 5\' 6"  (1.676 m)  Wt 242 lb (  109.77 kg)  BMI 39.08 kg/m2  SpO2 95% Physical Exam  Constitutional: He is oriented to person, place, and time. He appears well-developed and well-nourished. No distress.  HENT:  Head: Normocephalic and atraumatic.  Mouth/Throat: Oropharynx is clear and moist.  Eyes: EOM are normal. Pupils are equal, round, and reactive to light.  Neck: Normal range of motion. Neck supple. No thyromegaly present.  Cardiovascular: Normal rate, regular rhythm and intact distal pulses.  Murmur heard. 3/6 systolic murmur at RSB  Pulmonary/Chest: Effort normal and breath sounds normal. No respiratory distress. He has no rales.  Abdominal: Soft. Bowel sounds are normal. He exhibits no distension and no mass. There is no tenderness.  Musculoskeletal: Normal range of motion. He exhibits no edema.  Lymphadenopathy:   He has no cervical adenopathy.  Neurological: He is alert and oriented to person, place, and time. He has normal strength. No cranial nerve deficit or sensory deficit.  Skin: Skin is warm and dry.  Psychiatric: He has a normal mood and affect.      Diagnostic Tests:   *[facility]*  [address] [place], [place] [address]  [phone]  ------------------------------------------------------------------- Transthoracic Echocardiography  Patient: Givanni, Behringer MR #: [id number] Study Date: 03/24/2015 Gender: M Age: 26 Height: 167.6 cm Weight: 106.1 kg BSA: 2.27 m^2 Pt. Status: Room: 3S03C  ORDERING Peter Martinique, M.D. SONOGRAPHER Jimmy Reel, RDCS PERFORMING [facility], Inpatient  cc:  ------------------------------------------------------------------- LV EF: 55% - 60%  ------------------------------------------------------------------- Indications: CAD of native vessels 414.01.  ------------------------------------------------------------------- History: PMH: Atrial flutter. Aortic stenosis. Chronic obstructive pulmonary disease. Risk factors: Hypertension. Dyslipidemia.  ------------------------------------------------------------------- Study Conclusions  - Left ventricle: The cavity size was normal. There was mild  concentric hypertrophy. Systolic function was normal. The  estimated ejection fraction was in the range of 55% to 60%. Wall  motion was normal; there were no regional wall motion  abnormalities. Doppler parameters are consistent with abnormal  left ventricular relaxation (grade 1 diastolic dysfunction). - Aortic valve: There was severe stenosis.  Transthoracic echocardiography. M-mode, complete 2D, spectral Doppler, and color Doppler. Birthdate: Patient birthdate: January 22, 1946. Age: Patient is 70 yr old. Sex: Gender: male. BMI: 37.8 kg/m^2. Blood pressure: 126/68 Patient status: Inpatient. Study date: Study date: 03/24/2015. Study time: 10:46 AM. Location:  ICU/CCU  -------------------------------------------------------------------  ------------------------------------------------------------------- Left ventricle: The cavity size was normal. There was mild concentric hypertrophy. Systolic function was normal. The estimated ejection fraction was in the range of 55% to 60%. Wall motion was normal; there were no regional wall motion abnormalities. Doppler parameters are consistent with abnormal left ventricular relaxation (grade 1 diastolic dysfunction).  ------------------------------------------------------------------- Aortic valve: Moderately thickened, mildly calcified leaflets. Doppler: There was severe stenosis. There was no significant regurgitation. Mean gradient (S): 36 mm Hg. Peak gradient (S): 69 mm Hg.  ------------------------------------------------------------------- Aorta: The aorta was normal, not dilated, and non-diseased.  ------------------------------------------------------------------- Mitral valve: Mildly thickened leaflets . Doppler: There was no significant regurgitation. Peak gradient (D): 6 mm Hg.  ------------------------------------------------------------------- Left atrium: The atrium was at the upper limits of normal in size.  ------------------------------------------------------------------- Atrial septum: Poorly visualized.  ------------------------------------------------------------------- Right ventricle: The cavity size was normal. Wall thickness was normal. Systolic function was normal.  ------------------------------------------------------------------- Pulmonic valve: Poorly visualized.  ------------------------------------------------------------------- Tricuspid valve: Structurally normal valve. Leaflet separation was normal. Doppler: Transvalvular velocity was within the normal range. There was no  regurgitation.  ------------------------------------------------------------------- Right atrium: The atrium was normal in size.  ------------------------------------------------------------------- Pericardium: There was no pericardial effusion.  -------------------------------------------------------------------  Systemic veins: Inferior vena cava: The vessel was normal in size. The respirophasic diameter changes were in the normal range (>= 50%), consistent with normal central venous pressure.  ------------------------------------------------------------------- Post procedure conclusions Ascending Aorta:  - The aorta was normal, not dilated, and non-diseased.  ------------------------------------------------------------------- Measurements  Left ventricle Value Reference LV ID, ED, PLAX chordal 49.1 mm 43 - 52 LV ID, ES, PLAX chordal 34.5 mm 23 - 38 LV fx shortening, PLAX chordal 30 % >=29 LV PW thickness, ED 10.2 mm --------- IVS/LV PW ratio, ED 1.08 <=1.3 LV e&', lateral 6 cm/s --------- LV E/e&', lateral 19.67 --------- LV s&', lateral 7.2 cm/s --------- LV e&', medial 7.24 cm/s --------- LV E/e&', medial 16.3 --------- LV e&', average 6.62 cm/s --------- LV E/e&', average 17.82 ---------  Ventricular septum Value Reference IVS thickness, ED 11 mm ---------  LVOT Value Reference LVOT ID, S 24 mm --------- LVOT area 4.52 cm^2 ---------  Aortic  valve Value Reference Aortic valve peak velocity, S 415 cm/s --------- Aortic valve mean velocity, S 280 cm/s --------- Aortic valve VTI, S 83 cm --------- Aortic mean gradient, S 36 mm Hg --------- Aortic peak gradient, S 69 mm Hg ---------  Aorta Value Reference Aortic root ID, ED 32 mm --------- Ascending aorta ID, A-P, S 33 mm ---------  Left atrium Value Reference LA ID, A-P, ES 40 mm --------- LA ID/bsa, A-P 1.76 cm/m^2 <=2.2 LA volume, S 105 ml --------- LA volume/bsa, S 46.2 ml/m^2 --------- LA volume, ES, 1-p A4C 96 ml --------- LA volume/bsa, ES, 1-p A4C 42.3 ml/m^2 --------- LA volume, ES, 1-p A2C 107 ml --------- LA volume/bsa, ES, 1-p A2C 47.1 ml/m^2 ---------  Mitral valve Value Reference Mitral E-wave peak velocity 118 cm/s --------- Mitral A-wave peak velocity 154 cm/s --------- Mitral deceleration time 194 ms 150 - 230 Mitral peak gradient, D 6 mm Hg --------- Mitral E/A ratio, peak 0.8 ---------  Systemic veins Value Reference Estimated CVP 3 mm Hg ---------  Right ventricle Value Reference RV s&', lateral, S 13 cm/s ---------  Legend: (L) and (H) mark values outside specified reference  range.  ------------------------------------------------------------------- Prepared and Electronically Authenticated by  Darlin Coco, MD 2017-01-16T14:56:10  Burnell Blanks, MD (Primary)      Procedures    Right/Left Heart Cath and Coronary Angiography    Conclusion    1. No angiographic evidence of CAD 2. Severe aortic valve stenosis (mean gradient 33 mmHg, peak gradient 39 mmHg, AVA 0.95 cm2).  3. Elevated troponin likely due to demand ischemia with severe AS and acute COPD exacerbation.   Recommendations: He has no evidence of CAD or plaque rupture. Elevated troponin likely due to demand ischemia given aortic stenosis and acute COPD exacerbation. Will hydrate overnight. Consider outpatient referral to CT surgery once COPD exacerbation resolved to discuss AVR.     Indications    NSTEMI (non-ST elevated myocardial infarction) ([place]) [I21.4 (ICD-10-CM)]   Aortic stenosis [I35.0 (ICD-10-CM)]    Technique and Indications    Estimated blood loss <50 mL. Indication: 70 yo male with history of atrial fibrillation, COPD admitted with COPD exacerbation. Troponin mildly elevated. Pt found to have severe aortic valve stenosis during this admission.   Procedure: The risks, benefits, complications, treatment options, and expected outcomes were discussed with the patient. The patient and/or family concurred with the proposed plan, giving informed consent. The patient was brought to the cath lab after IV hydration was begun and oral premedication was given. The patient was further sedated with Versed and  Fentanyl. The right groin was prepped and draped in the usual manner. Using the modified Seldinger access technique, a 5 French sheath was placed in the right femoral artery and a 7 French sheath was placed in the right femoral vein. A balloon tipped catheter was used to perform a right heart catheterization. Standard diagnostic catheters were used to  perform selective coronary angiography. I crossed the aortic valve with a JR4 catheter and straight wire. No left ventricular angiogram was performed.   There were no immediate complications. The patient was taken to the recovery area in stable condition.    Coronary Findings    Dominance: Right   Left Anterior Descending  . Vessel is large. Vessel is angiographically normal.   . First Diagonal Branch   The vessel is small in size.   . First Septal Branch   The vessel is small in size.     Left Circumflex  . Vessel is moderate in size.   . First Obtuse Marginal Branch   The vessel is moderate in size.   . Third Obtuse Marginal Branch   The vessel is small in size.     Right Coronary Artery  . Vessel is moderate in size. Vessel is angiographically normal.   . Right Posterior Descending Artery   The vessel is small in size.       Right Heart Pressures LV EDP is normal.    Left Heart    Aortic Valve There is severe aortic valve stenosis.    Coronary Diagrams    Diagnostic Diagram            Implants    No implant documentation for this case.    PACS Images    Show images for Cardiac catheterization     Link to Procedure Log    Procedure Log      Hemo Data       Most Recent Value   Fick Cardiac Output  6.4 L/min   Fick Cardiac Output Index  3.03 (L/min)/BSA   Aortic Mean Gradient  33.1 mmHg   Aortic Peak Gradient  39 mmHg   Aortic Valve Area  0.95   Aortic Value Area Index  0.45 cm2/BSA   RA A Wave  8 mmHg   RA V Wave  8 mmHg   RA Mean  7 mmHg   RV Systolic Pressure  33 mmHg   RV Diastolic Pressure  5 mmHg   RV EDP  10 mmHg   PA Systolic Pressure  26 mmHg   PA Diastolic Pressure  12 mmHg   PA Mean  14 mmHg   PW A Wave  12 mmHg   PW V Wave  10 mmHg   PW Mean  10  mmHg   AO Systolic Pressure  123XX123 mmHg   AO Diastolic Pressure  68 mmHg   AO Mean  87 mmHg   LV Systolic Pressure  A999333 mmHg   LV Diastolic Pressure  13 mmHg   LV EDP  17 mmHg   Arterial Occlusion Pressure Extended Systolic Pressure  123456 mmHg   Arterial Occlusion Pressure Extended Diastolic Pressure  62 mmHg   Arterial Occlusion Pressure Extended Mean Pressure  86 mmHg   Left Ventricular Apex Extended Systolic Pressure  0000000 mmHg   Left Ventricular Apex Extended Diastolic Pressure  11 mmHg   Left Ventricular Apex Extended EDP Pressure  16 mmHg   QP/QS  1   TPVR Index  4.61 HRUI   TSVR Index  28.69 HRUI   PVR SVR Ratio  0.05   TPVR/TSVR Ratio  0.16    ADDENDUM REPORT: 06/11/2015 15:11 EXAM: OVER-READ INTERPRETATION CT CHEST The following report is an over-read performed by radiologist Dr. Norlene Duel [facility] on 06/11/2015. This over-read does not include interpretation of cardiac or coronary anatomy or pathology. The coronary calcification interpretation by the cardiologist is attached. COMPARISON: None FINDINGS: Mediastinum: The visualized lower airway appears patent. The visualized portions of the esophagus are normal. No mediastinal or hilar adenopathy identified. No axillary adenopathy. Lungs/Pleura: Moderate changes of centrilobular emphysema identified. There is no pleural fluid identified. No suspicious nodule or mass noted. Upper Abdomen: Calcified granulomas identified within the liver. Musculoskeletal: Degenerative disc disease is noted within the thoracic spine. IMPRESSION: 1. Emphysema. 2. Prior granulomatous disease. Electronically Signed  By: Kerby Moors M.D.  On: 06/11/2015 15:11     Study Result     CLINICAL DATA: Aortic stenosis  EXAM: Cardiac TAVR CT  TECHNIQUE: The patient was scanned on a Philips 256 scanner. A 120  kV retrospective scan was triggered in the descending thoracic aorta at 111 HU's. Gantry rotation speed was 270 msecs and collimation was .9 mm. No beta blockade or nitro were given. The 3D data set was reconstructed in 5% intervals of the R-R cycle. Systolic and diastolic phases were analyzed on a dedicated work station using MPR, MIP and VRT modes. The patient received 80 cc of contrast.  FINDINGS: Aortic Valve: Trileaflet moderately calcified leaflets. Small area of calcification at the base of the right leaflet  Aorta: Mild calcification of the aortic arch No aneurysm or debris  Sinotubular Junction: 32 mm  Ascending Thoracic Aorta: 34 mm  Aortic Arch: 28 mm  Descending Thoracic Aorta: 23 mm  Sinus of Valsalva Measurements:  Non-coronary: 35 mm  Right -coronary: 33 mm  Left -coronary: 34 mm  Coronary Artery Height above Annulus:  Left Main: 14 mm  Right Coronary: 18 mm  Virtual Basal Annulus Measurements:  Maximum/Minimum Diameter: 28 x 26 mm  Perimeter: 89 mm  Area: 618 mm2  Coronary Arteries: Sufficient height above annulus for delivery  Optimum Fluoroscopic Angle for Delivery: LAO 6 degrees Cranial 0 degrees  IMPRESSION: 1) Calcified tri-leaflet aortic valve with annulus of 618 mm2 suitable for 29 mm Sapien 3 valve  2) Sufficient coronary height for delivery  3) No aortic root pathology or aneurysm  4) Optimum angiographic angle for delivery LAO 6 degrees Cranial 0 degrees  5) No LAA thrombus  Jenkins Rouge  EXAM: Cardiac CTA  MEDICATIONS: Sub lingual nitro. 4mg  and lopressor mg  TECHNIQUE: The patient was scanned on a Philips [id number] slice scanner. Gantry rotation speed was 270 msecs. Collimation was .84mm. A 100 kV prospective scan was triggered in the descending thoracic aorta at 111 HU's with 5% padding centered around 78% of the R-R interval. Average HR during the scan was bpm. The 3D data set  was interpreted on a dedicated work station using MPR, MIP and VRT modes. A total of 80cc of contrast was used.  FINDINGS: Non-cardiac: See separate report from [facility]. No significant findings on limited lung and soft tissue windows.  Calcium Score:  Coronary Arteries: Right dominant with no anomalies  LM:  LAD:  D1:  D2:  Circumflex:  OM1:  OM2:  RCA:  PDA:  PLA:  IMPRESSION: Jenkins Rouge  Electronically Signed: By: Jenkins Rouge M.D. On: 06/11/2015 13:49     CLINICAL DATA: 70 year old male with history of  severe aortic stenosis. Preprocedural study prior to potential transcatheter aortic valve replacement (TAVR).  EXAM: CT ANGIOGRAPHY CHEST, ABDOMEN AND PELVIS  TECHNIQUE: Multidetector CT imaging through the chest, abdomen and pelvis was performed using the standard protocol during bolus administration of intravenous contrast. Multiplanar reconstructed images and MIPs were obtained and reviewed to evaluate the vascular anatomy.  CONTRAST: 70 mL of Isovue 370.  COMPARISON: None.  FINDINGS: CTA CHEST FINDINGS  Mediastinum/Lymph Nodes: Heart size is normal. There is no significant pericardial fluid, thickening or pericardial calcification. There is atherosclerosis of the thoracic aorta, the great vessels of the mediastinum and the coronary arteries, including calcified atherosclerotic plaque in the left main and left anterior descending coronary arteries. Severe thickening calcification of the aortic valve. Mild calcifications of the mitral valve. No pathologically enlarged mediastinal or hilar lymph nodes. Esophagus is unremarkable in appearance. Heterogeneously enlarged thyroid gland with multiple nodules, the largest of which is in the anterior aspect of the right lobe of the gland measuring approximately 1.9 x 2.6 cm (image 16 of series 501). No axillary lymphadenopathy.  Lungs/Pleura: In the medial  aspect of the left lower lobe there are several clustered micronodules measuring up to 5 mm in size (image 97 of series 507), favored to be a related to areas of mucoid impaction within terminal bronchioles. No other more suspicious appearing pulmonary nodules or masses are noted. Mild diffuse bronchial wall thickening with mild centrilobular emphysema. No acute consolidative airspace disease. No pleural effusions.  Musculoskeletal/Soft Tissues: There are no aggressive appearing lytic or blastic lesions noted in the visualized portions of the skeleton.  CTA ABDOMEN AND PELVIS FINDINGS  Hepatobiliary: Several small calcified granulomas are noted in the liver. No suspicious cystic or solid hepatic lesions. No intra or extrahepatic biliary ductal dilatation. Gallbladder is normal in appearance.  Pancreas: No pancreatic mass. No pancreatic ductal dilatation. No pancreatic or peripancreatic fluid or inflammatory changes.  Spleen: Unremarkable.  Adrenals/Urinary Tract: Bilateral adrenal glands are normal in appearance. Multifocal cortical thinning in the kidneys bilaterally, indicative of mild post infectious or inflammatory scarring. Exophytic 5.6 cm low-attenuation lesion in the interpolar region of the left kidney is compatible with a simple cyst. No suspicious renal lesions. No hydroureteronephrosis. Urinary bladder is normal in appearance.  Stomach/Bowel: The appearance of the stomach is normal. There is no pathologic dilatation of small bowel or colon. Numerous colonic diverticulae are noted, particularly in the sigmoid colon, without surrounding inflammatory changes to suggest an acute diverticulitis at this time. Normal appendix.  Vascular/Lymphatic: Atherosclerosis throughout the abdominal and pelvic vasculature, without evidence of aneurysm or dissection. Vascular findings and measurements pertinent to potential TAVR procedure, as detailed below. Single renal  arteries bilaterally. No lymphadenopathy noted in the abdomen or pelvis.  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  Other: No significant volume of ascites. No pneumoperitoneum.  Musculoskeletal: There are no aggressive appearing lytic or blastic lesions noted in the visualized portions of the skeleton.  VASCULAR MEASUREMENTS PERTINENT TO TAVR:  AORTA:  Minimal Aortic Diameter - 11 x 13 mm  Severity of Aortic Calcification - moderate  RIGHT PELVIS:  Right Common Iliac Artery -  Minimal Diameter - 9.4 x 10.1 mm  Tortuosity - mild  Calcification - mild  Right External Iliac Artery -  Minimal Diameter - 8.6 x 6.3 mm  Tortuosity - mild  Calcification - none  Right Common Femoral Artery -  Minimal Diameter - 8.6 x 7.1 mm  Tortuosity - mild  Calcification - mild  LEFT  PELVIS:  Left Common Iliac Artery -  Minimal Diameter - 9.6 x 8.2 mm  Tortuosity - mild  Calcification - mild  Left External Iliac Artery -  Minimal Diameter - 9.5 x 9.1 mm  Tortuosity - mild  Calcification - none  Left Common Femoral Artery -  Minimal Diameter - 8.9 x 7.3 mm  Tortuosity - mild  Calcification - mild  Review of the MIP images confirms the above findings.  IMPRESSION: 1. Vascular findings and measurements pertinent to potential TAVR procedure, as detailed above. This patient appears to have suitable pelvic arterial access bilaterally. 2. Severe thickening calcification of the aortic valve, compatible with the reported clinical history of severe aortic stenosis. 3. Cluster of small nodules in the left lower lobe measuring 5 mm or less in size. No follow-up needed if patient is low-risk (and has no known or suspected primary neoplasm). Non-contrast chest CT can be considered in 12 months if patient is high-risk. This recommendation follows the consensus statement: Guidelines for Management of Incidental  Pulmonary Nodules Detected on CT Images:From the Fleischner Society 2017; published online before print (10.1148/radiol.IJ:2314499). 4. Mild diffuse bronchial wall thickening with mild centrilobular emphysema; imaging findings suggestive of underlying COPD. 5. Colonic diverticulosis without evidence of acute diverticulitis at this time. 6. Additional incidental findings, as above.   Electronically Signed  By: Vinnie Langton M.D.  On: 06/16/2015 11:03       Impression:       He has severe symptomatic aortic stenosis with exertional shortness of breath and fatigue due to chronic diastolic heart failure, NYHA class III. I have personally reviewed his CT scans and he appears to be a candidate for a transfemoral TAVR. The patient and his wife were counseled at length regarding treatment alternatives for management of severe symptomatic aortic stenosis. Alternative approaches such as conventional aortic valve replacement, transcatheter aortic valve replacement, and palliative medical therapy were compared and contrasted at length. The risks associated with conventional surgical aortic valve replacement were been discussed in detail, as were expectations for post-operative convalescence following both conventional surgery and TAVR. Long-term prognosis with medical therapy was discussed.  All of their questions been addressed. The patient desires to proceed with TAVR as an alternative to moderate risk conventional surgical aortic valve replacement. He has stopped taking warfarin and aspirin as of yesterday for surgery.  Following the decision to proceed with transcatheter aortic valve replacement, a discussion has been held regarding what types of management strategies would be attempted intraoperatively in the event of life-threatening complications, including whether or not the patient would be considered a candidate for the use of cardiopulmonary bypass and/or conversion to open  sternotomy for attempted surgical intervention. The patient has been advised of a variety of complications that might develop including but not limited to risks of death, stroke, paravalvular leak, aortic dissection or other major vascular complications, aortic annulus rupture, device embolization, cardiac rupture or perforation, mitral regurgitation, acute myocardial infarction, arrhythmia, heart block or bradycardia requiring permanent pacemaker placement, congestive heart failure, respiratory failure, renal failure, pneumonia, infection, other late complications related to structural valve deterioration or migration, or other complications that might ultimately cause a temporary or permanent loss of functional independence or other long term morbidity. The patient provides full informed consent for the procedure as described and all questions were answered.   Plan:  Transfemoral TAVR on Tuesday, 07/08/2015.  Gaye Pollack, MD [facility] [phone]

## 2015-07-08 ENCOUNTER — Inpatient Hospital Stay (HOSPITAL_COMMUNITY)
Admission: RE | Admit: 2015-07-08 | Discharge: 2015-07-10 | DRG: 267 | Disposition: A | Discharge status: Home / Self Care | Payer: Medicare PPO | Source: Ambulatory Visit | Attending: Cardiovascular Disease | Admitting: Cardiovascular Disease

## 2015-07-08 ENCOUNTER — Inpatient Hospital Stay (HOSPITAL_COMMUNITY): Payer: Medicare PPO | Admitting: Certified Registered"

## 2015-07-08 ENCOUNTER — Inpatient Hospital Stay (HOSPITAL_COMMUNITY): Payer: Medicare PPO

## 2015-07-08 ENCOUNTER — Encounter (HOSPITAL_COMMUNITY): Payer: Self-pay | Admitting: Certified Registered"

## 2015-07-08 ENCOUNTER — Encounter (HOSPITAL_COMMUNITY)
Admission: RE | Disposition: A | Discharge status: Home / Self Care | Payer: Self-pay | Source: Ambulatory Visit | Attending: Cardiovascular Disease

## 2015-07-08 DIAGNOSIS — Z87891 Personal history of nicotine dependence: Secondary | ICD-10-CM | POA: Diagnosis not present

## 2015-07-08 DIAGNOSIS — Z7901 Long term (current) use of anticoagulants: Secondary | ICD-10-CM | POA: Diagnosis not present

## 2015-07-08 DIAGNOSIS — I4892 Unspecified atrial flutter: Secondary | ICD-10-CM | POA: Diagnosis present

## 2015-07-08 DIAGNOSIS — I252 Old myocardial infarction: Secondary | ICD-10-CM

## 2015-07-08 DIAGNOSIS — J449 Chronic obstructive pulmonary disease, unspecified: Secondary | ICD-10-CM | POA: Diagnosis present

## 2015-07-08 DIAGNOSIS — Z952 Presence of prosthetic heart valve: Secondary | ICD-10-CM

## 2015-07-08 DIAGNOSIS — E785 Hyperlipidemia, unspecified: Secondary | ICD-10-CM | POA: Diagnosis present

## 2015-07-08 DIAGNOSIS — I130 Hypertensive heart and chronic kidney disease with heart failure and stage 1 through stage 4 chronic kidney disease, or unspecified chronic kidney disease: Secondary | ICD-10-CM | POA: Diagnosis present

## 2015-07-08 DIAGNOSIS — I429 Cardiomyopathy, unspecified: Secondary | ICD-10-CM | POA: Diagnosis present

## 2015-07-08 DIAGNOSIS — Z7982 Long term (current) use of aspirin: Secondary | ICD-10-CM | POA: Diagnosis not present

## 2015-07-08 DIAGNOSIS — Z7902 Long term (current) use of antithrombotics/antiplatelets: Secondary | ICD-10-CM

## 2015-07-08 DIAGNOSIS — Z006 Encounter for examination for normal comparison and control in clinical research program: Secondary | ICD-10-CM | POA: Diagnosis not present

## 2015-07-08 DIAGNOSIS — I350 Nonrheumatic aortic (valve) stenosis: Secondary | ICD-10-CM | POA: Diagnosis present

## 2015-07-08 DIAGNOSIS — Z954 Presence of other heart-valve replacement: Secondary | ICD-10-CM | POA: Diagnosis not present

## 2015-07-08 DIAGNOSIS — I359 Nonrheumatic aortic valve disorder, unspecified: Secondary | ICD-10-CM | POA: Diagnosis not present

## 2015-07-08 DIAGNOSIS — N183 Chronic kidney disease, stage 3 (moderate): Secondary | ICD-10-CM | POA: Diagnosis present

## 2015-07-08 HISTORY — PX: TEE WITHOUT CARDIOVERSION: SHX5443

## 2015-07-08 HISTORY — PX: TRANSCATHETER AORTIC VALVE REPLACEMENT, TRANSFEMORAL: SHX6400

## 2015-07-08 LAB — POCT I-STAT 3, ART BLOOD GAS (G3+)
ACID-BASE EXCESS: 3 mmol/L — AB (ref 0.0–2.0)
Acid-Base Excess: 1 mmol/L (ref 0.0–2.0)
Acid-base deficit: 1 mmol/L (ref 0.0–2.0)
Bicarbonate: 29.3 mEq/L — ABNORMAL HIGH (ref 20.0–24.0)
Bicarbonate: 29.9 mEq/L — ABNORMAL HIGH (ref 20.0–24.0)
Bicarbonate: 27.3 mEq/L — ABNORMAL HIGH (ref 20.0–24.0)
O2 Saturation: 100 %
O2 Saturation: 100 %
O2 Saturation: 95 %
PH ART: 7.294 — AB (ref 7.350–7.450)
PH ART: 7.382 (ref 7.350–7.450)
PH ART: 7.246 — AB (ref 7.350–7.450)
PO2 ART: 494 mmHg — AB (ref 80.0–100.0)
TCO2: 31 mmol/L (ref 0–100)
TCO2: 31 mmol/L (ref 0–100)
TCO2: 29 mmol/L (ref 0–100)
pCO2 arterial: 49.2 mmHg — ABNORMAL HIGH (ref 35.0–45.0)
pCO2 arterial: 59.2 mmHg (ref 35.0–45.0)
pCO2 arterial: 62.6 mmHg (ref 35.0–45.0)
pO2, Arterial: 555 mmHg — ABNORMAL HIGH (ref 80.0–100.0)
pO2, Arterial: 86 mmHg (ref 80.0–100.0)

## 2015-07-08 LAB — CREATININE, SERUM: Creatinine, Ser: 1.06 mg/dL (ref 0.61–1.24)

## 2015-07-08 LAB — POCT I-STAT 4, (NA,K, GLUC, HGB,HCT)
GLUCOSE: 141 mg/dL — AB (ref 65–99)
HCT: 37 % — ABNORMAL LOW (ref 39.0–52.0)
Hemoglobin: 12.6 g/dL — ABNORMAL LOW (ref 13.0–17.0)
Potassium: 4 mmol/L (ref 3.5–5.1)
SODIUM: 139 mmol/L (ref 135–145)

## 2015-07-08 LAB — CBC
HEMATOCRIT: 35.1 % — AB (ref 39.0–52.0)
HEMATOCRIT: 33.7 % — AB (ref 39.0–52.0)
HEMOGLOBIN: 11.4 g/dL — AB (ref 13.0–17.0)
Hemoglobin: 10.8 g/dL — ABNORMAL LOW (ref 13.0–17.0)
MCH: 29.2 pg (ref 26.0–34.0)
MCH: 28.2 pg (ref 26.0–34.0)
MCHC: 32.5 g/dL (ref 30.0–36.0)
MCHC: 32 g/dL (ref 30.0–36.0)
MCV: 90 fL (ref 78.0–100.0)
MCV: 88 fL (ref 78.0–100.0)
PLATELETS: 146 10*3/uL — AB (ref 150–400)
Platelets: 145 10*3/uL — ABNORMAL LOW (ref 150–400)
RBC: 3.9 MIL/uL — AB (ref 4.22–5.81)
RBC: 3.83 MIL/uL — ABNORMAL LOW (ref 4.22–5.81)
RDW: 14.3 % (ref 11.5–15.5)
RDW: 14.3 % (ref 11.5–15.5)
WBC: 11 10*3/uL — ABNORMAL HIGH (ref 4.0–10.5)
WBC: 12.2 10*3/uL — AB (ref 4.0–10.5)

## 2015-07-08 LAB — POCT I-STAT, CHEM 8
BUN: 20 mg/dL (ref 6–20)
BUN: 16 mg/dL (ref 6–20)
CALCIUM ION: 1.23 mmol/L (ref 1.13–1.30)
CREATININE: 0.8 mg/dL (ref 0.61–1.24)
Calcium, Ion: 1.24 mmol/L (ref 1.13–1.30)
Chloride: 104 mmol/L (ref 101–111)
Chloride: 104 mmol/L (ref 101–111)
Creatinine, Ser: 1 mg/dL (ref 0.61–1.24)
GLUCOSE: 112 mg/dL — AB (ref 65–99)
GLUCOSE: 112 mg/dL — AB (ref 65–99)
HCT: 34 % — ABNORMAL LOW (ref 39.0–52.0)
HEMATOCRIT: 38 % — AB (ref 39.0–52.0)
HEMOGLOBIN: 11.6 g/dL — AB (ref 13.0–17.0)
Hemoglobin: 12.9 g/dL — ABNORMAL LOW (ref 13.0–17.0)
POTASSIUM: 4.2 mmol/L (ref 3.5–5.1)
Potassium: 4.9 mmol/L (ref 3.5–5.1)
Sodium: 143 mmol/L (ref 135–145)
Sodium: 142 mmol/L (ref 135–145)
TCO2: 29 mmol/L (ref 0–100)
TCO2: 29 mmol/L (ref 0–100)

## 2015-07-08 LAB — PREPARE RBC (CROSSMATCH)

## 2015-07-08 LAB — APTT: aPTT: 33 seconds (ref 24–37)

## 2015-07-08 LAB — PROTIME-INR
INR: 1.38 (ref 0.00–1.49)
PROTHROMBIN TIME: 17 s — AB (ref 11.6–15.2)

## 2015-07-08 LAB — MAGNESIUM: MAGNESIUM: 1.8 mg/dL (ref 1.7–2.4)

## 2015-07-08 LAB — GLUCOSE, CAPILLARY: GLUCOSE-CAPILLARY: 137 mg/dL — AB (ref 65–99)

## 2015-07-08 SURGERY — IMPLANTATION, AORTIC VALVE, TRANSCATHETER, FEMORAL APPROACH
Anesthesia: General

## 2015-07-08 MED ORDER — FE FUMARATE-B12-VIT C-FA-IFC PO CAPS
1.0000 | ORAL_CAPSULE | Freq: Two times a day (BID) | ORAL | Status: DC
  Administered 2015-07-09 – 2015-07-10 (×3): 1 via ORAL
  Filled 2015-07-08 (×3): qty 1

## 2015-07-08 MED ORDER — METOPROLOL TARTRATE 5 MG/5ML IV SOLN
INTRAVENOUS | Status: DC | PRN
  Administered 2015-07-08: 5 mg via INTRAVENOUS

## 2015-07-08 MED ORDER — EPINEPHRINE HCL 0.1 MG/ML IJ SOSY
PREFILLED_SYRINGE | INTRAMUSCULAR | Status: DC | PRN
  Administered 2015-07-08: 2 ug via INTRAVENOUS
  Administered 2015-07-08: 4 ug via INTRAVENOUS
  Administered 2015-07-08: 1 ug via INTRAVENOUS

## 2015-07-08 MED ORDER — MIDAZOLAM HCL 5 MG/5ML IJ SOLN
INTRAMUSCULAR | Status: DC | PRN
  Administered 2015-07-08 (×2): 1 mg via INTRAVENOUS

## 2015-07-08 MED ORDER — 0.9 % SODIUM CHLORIDE (POUR BTL) OPTIME
TOPICAL | Status: DC | PRN
  Administered 2015-07-08: 1000 mL

## 2015-07-08 MED ORDER — SODIUM CHLORIDE 0.9 % IV SOLN: INTRAVENOUS | Status: DC

## 2015-07-08 MED ORDER — MIDAZOLAM HCL 2 MG/2ML IJ SOLN: 2.0000 mg | INTRAMUSCULAR | Status: DC | PRN

## 2015-07-08 MED ORDER — HEPARIN SODIUM (PORCINE) 1000 UNIT/ML IJ SOLN
INTRAMUSCULAR | Status: AC
  Filled 2015-07-08: qty 1

## 2015-07-08 MED ORDER — PROPOFOL 10 MG/ML IV BOLUS
INTRAVENOUS | Status: DC | PRN
Start: 2015-07-08 — End: 2015-07-08
  Administered 2015-07-08 (×2): 30 mg via INTRAVENOUS

## 2015-07-08 MED ORDER — SODIUM CHLORIDE 0.9 % IV SOLN
INTRAVENOUS | Status: DC | PRN
  Administered 2015-07-08: 500 mL

## 2015-07-08 MED ORDER — PROPOFOL 10 MG/ML IV BOLUS
INTRAVENOUS | Status: AC
  Filled 2015-07-08: qty 20

## 2015-07-08 MED ORDER — SODIUM CHLORIDE 0.9 % IV SOLN
INTRAVENOUS | Status: DC
  Filled 2015-07-08: qty 2.5

## 2015-07-08 MED ORDER — CHLORHEXIDINE GLUCONATE 4 % EX LIQD: 60.0000 mL | Freq: Once | CUTANEOUS | Status: DC

## 2015-07-08 MED ORDER — LACTATED RINGERS IV SOLN
INTRAVENOUS | Status: DC | PRN
  Administered 2015-07-08 (×2): via INTRAVENOUS

## 2015-07-08 MED ORDER — DIPHENHYDRAMINE HCL 50 MG/ML IJ SOLN
INTRAMUSCULAR | Status: DC | PRN
  Administered 2015-07-08: 25 mg via INTRAVENOUS

## 2015-07-08 MED ORDER — PROTAMINE SULFATE 10 MG/ML IV SOLN
INTRAVENOUS | Status: AC
  Filled 2015-07-08: qty 25

## 2015-07-08 MED ORDER — PRAVASTATIN SODIUM 40 MG PO TABS
40.0000 mg | ORAL_TABLET | Freq: Every day | ORAL | Status: DC
  Administered 2015-07-08 – 2015-07-09 (×2): 40 mg via ORAL
  Filled 2015-07-08 (×2): qty 1

## 2015-07-08 MED ORDER — PHENYLEPHRINE HCL 10 MG/ML IJ SOLN
INTRAMUSCULAR | Status: DC | PRN
  Administered 2015-07-08 (×3): 80 ug via INTRAVENOUS

## 2015-07-08 MED ORDER — HEPARIN SODIUM (PORCINE) 1000 UNIT/ML IJ SOLN
INTRAMUSCULAR | Status: DC | PRN
  Administered 2015-07-08: 14000 [IU] via INTRAVENOUS

## 2015-07-08 MED ORDER — ONDANSETRON HCL 4 MG/2ML IJ SOLN
INTRAMUSCULAR | Status: DC | PRN
  Administered 2015-07-08: 4 mg via INTRAVENOUS

## 2015-07-08 MED ORDER — CHLORHEXIDINE GLUCONATE 0.12 % MT SOLN: 15.0000 mL | Freq: Once | OROMUCOSAL | Status: DC

## 2015-07-08 MED ORDER — MORPHINE SULFATE (PF) 2 MG/ML IV SOLN: 1.0000 mg | INTRAVENOUS | Status: AC | PRN

## 2015-07-08 MED ORDER — FENTANYL CITRATE (PF) 100 MCG/2ML IJ SOLN
INTRAMUSCULAR | Status: DC | PRN
  Administered 2015-07-08 (×2): 25 ug via INTRAVENOUS
  Administered 2015-07-08: 150 ug via INTRAVENOUS

## 2015-07-08 MED ORDER — CARVEDILOL 6.25 MG PO TABS
6.2500 mg | ORAL_TABLET | Freq: Two times a day (BID) | ORAL | Status: DC
  Administered 2015-07-08: 6.25 mg via ORAL
  Filled 2015-07-08: qty 1

## 2015-07-08 MED ORDER — IODIXANOL 320 MG/ML IV SOLN
INTRAVENOUS | Status: DC | PRN
  Administered 2015-07-08: 89.5 mL via INTRAVENOUS

## 2015-07-08 MED ORDER — VASOPRESSIN 20 UNIT/ML IV SOLN
INTRAVENOUS | Status: DC | PRN
  Administered 2015-07-08 (×3): 1 [IU] via INTRAVENOUS

## 2015-07-08 MED ORDER — ASPIRIN EC 325 MG PO TBEC
325.0000 mg | DELAYED_RELEASE_TABLET | Freq: Every day | ORAL | Status: DC
  Administered 2015-07-09: 325 mg via ORAL
  Filled 2015-07-08: qty 1

## 2015-07-08 MED ORDER — ONDANSETRON HCL 4 MG/2ML IJ SOLN
INTRAMUSCULAR | Status: AC
  Filled 2015-07-08: qty 2

## 2015-07-08 MED ORDER — SUGAMMADEX SODIUM 500 MG/5ML IV SOLN
INTRAVENOUS | Status: AC
  Filled 2015-07-08: qty 5

## 2015-07-08 MED ORDER — METOPROLOL TARTRATE 25 MG/10 ML ORAL SUSPENSION: 12.5000 mg | Freq: Two times a day (BID) | ORAL | Status: DC

## 2015-07-08 MED ORDER — POTASSIUM CHLORIDE 10 MEQ/50ML IV SOLN: 10.0000 meq | INTRAVENOUS | Status: DC

## 2015-07-08 MED ORDER — PROTAMINE SULFATE 10 MG/ML IV SOLN
INTRAVENOUS | Status: DC | PRN
  Administered 2015-07-08 (×2): 20 mg via INTRAVENOUS
  Administered 2015-07-08: 30 mg via INTRAVENOUS
  Administered 2015-07-08 (×2): 20 mg via INTRAVENOUS
  Administered 2015-07-08 (×2): 30 mg via INTRAVENOUS

## 2015-07-08 MED ORDER — ASPIRIN 81 MG PO CHEW: 81.0000 mg | CHEWABLE_TABLET | Freq: Every day | ORAL | Status: DC

## 2015-07-08 MED ORDER — COLCHICINE 0.6 MG PO TABS
0.6000 mg | ORAL_TABLET | Freq: Every day | ORAL | Status: DC
  Administered 2015-07-09 – 2015-07-10 (×2): 0.6 mg via ORAL
  Filled 2015-07-08 (×2): qty 1

## 2015-07-08 MED ORDER — INSULIN REGULAR BOLUS VIA INFUSION
0.0000 [IU] | Freq: Three times a day (TID) | INTRAVENOUS | Status: DC
  Filled 2015-07-08: qty 10

## 2015-07-08 MED ORDER — CHLORHEXIDINE GLUCONATE 0.12 % MT SOLN: 15.0000 mL | OROMUCOSAL | Status: DC

## 2015-07-08 MED ORDER — CLOPIDOGREL BISULFATE 75 MG PO TABS
75.0000 mg | ORAL_TABLET | Freq: Every day | ORAL | Status: DC
  Administered 2015-07-09: 75 mg via ORAL
  Filled 2015-07-08: qty 1

## 2015-07-08 MED ORDER — MIDAZOLAM HCL 2 MG/2ML IJ SOLN
INTRAMUSCULAR | Status: AC
  Filled 2015-07-08: qty 2

## 2015-07-08 MED ORDER — CEFUROXIME SODIUM 1.5 G IJ SOLR
1.5000 g | Freq: Two times a day (BID) | INTRAMUSCULAR | Status: DC
  Administered 2015-07-08 – 2015-07-09 (×2): 1.5 g via INTRAVENOUS
  Filled 2015-07-08 (×3): qty 1.5

## 2015-07-08 MED ORDER — ALBUTEROL SULFATE (2.5 MG/3ML) 0.083% IN NEBU
INHALATION_SOLUTION | RESPIRATORY_TRACT | Status: AC
  Administered 2015-07-08: 2.5 mg via RESPIRATORY_TRACT
  Filled 2015-07-08: qty 3

## 2015-07-08 MED ORDER — ACETAMINOPHEN 160 MG/5ML PO SOLN: 1000.0000 mg | Freq: Four times a day (QID) | ORAL | Status: DC

## 2015-07-08 MED ORDER — MORPHINE SULFATE (PF) 2 MG/ML IV SOLN: 2.0000 mg | INTRAVENOUS | Status: DC | PRN

## 2015-07-08 MED ORDER — INSULIN ASPART 100 UNIT/ML ~~LOC~~ SOLN
0.0000 [IU] | SUBCUTANEOUS | Status: DC
  Administered 2015-07-08: 2 [IU] via SUBCUTANEOUS

## 2015-07-08 MED ORDER — ACETAMINOPHEN 500 MG PO TABS
1000.0000 mg | ORAL_TABLET | Freq: Four times a day (QID) | ORAL | Status: DC
  Administered 2015-07-08 – 2015-07-09 (×3): 1000 mg via ORAL
  Filled 2015-07-08 (×4): qty 2

## 2015-07-08 MED ORDER — ACETAMINOPHEN 650 MG RE SUPP: 650.0000 mg | Freq: Once | RECTAL | Status: DC

## 2015-07-08 MED ORDER — CALCIUM CHLORIDE 10 % IV SOLN
INTRAVENOUS | Status: DC | PRN
  Administered 2015-07-08 (×2): 250 mg via INTRAVENOUS

## 2015-07-08 MED ORDER — SUGAMMADEX SODIUM 500 MG/5ML IV SOLN
INTRAVENOUS | Status: DC | PRN
  Administered 2015-07-08: 220 mg via INTRAVENOUS

## 2015-07-08 MED ORDER — ROCURONIUM BROMIDE 100 MG/10ML IV SOLN
INTRAVENOUS | Status: DC | PRN
Start: 2015-07-08 — End: 2015-07-08
  Administered 2015-07-08 (×2): 10 mg via INTRAVENOUS
  Administered 2015-07-08: 50 mg via INTRAVENOUS

## 2015-07-08 MED ORDER — FERROUS SULFATE 325 (65 FE) MG PO TABS
325.0000 mg | ORAL_TABLET | Freq: Every day | ORAL | Status: DC
  Administered 2015-07-08 – 2015-07-09 (×2): 325 mg via ORAL
  Filled 2015-07-08 (×2): qty 1

## 2015-07-08 MED ORDER — ALLOPURINOL 100 MG PO TABS
100.0000 mg | ORAL_TABLET | Freq: Every day | ORAL | Status: DC
  Administered 2015-07-09 – 2015-07-10 (×2): 100 mg via ORAL
  Filled 2015-07-08 (×2): qty 1

## 2015-07-08 MED ORDER — FAMOTIDINE IN NACL 20-0.9 MG/50ML-% IV SOLN
20.0000 mg | Freq: Two times a day (BID) | INTRAVENOUS | Status: DC
  Administered 2015-07-08: 20 mg via INTRAVENOUS
  Filled 2015-07-08: qty 50

## 2015-07-08 MED ORDER — ALBUTEROL SULFATE (2.5 MG/3ML) 0.083% IN NEBU
2.5000 mg | INHALATION_SOLUTION | Freq: Once | RESPIRATORY_TRACT | Status: AC
  Administered 2015-07-08: 2.5 mg via RESPIRATORY_TRACT
  Filled 2015-07-08: qty 3

## 2015-07-08 MED ORDER — ASPIRIN 81 MG PO CHEW: 324.0000 mg | CHEWABLE_TABLET | Freq: Every day | ORAL | Status: DC

## 2015-07-08 MED ORDER — ALBUMIN HUMAN 5 % IV SOLN
INTRAVENOUS | Status: DC | PRN
Start: 2015-07-08 — End: 2015-07-08
  Administered 2015-07-08 (×2): via INTRAVENOUS

## 2015-07-08 MED ORDER — HYDROCORTISONE NA SUCCINATE PF 100 MG IJ SOLR
INTRAMUSCULAR | Status: DC | PRN
Start: 2015-07-08 — End: 2015-07-08
  Administered 2015-07-08: 100 mg via INTRAVENOUS

## 2015-07-08 MED ORDER — MAGNESIUM SULFATE 4 GM/100ML IV SOLN
4.0000 g | Freq: Once | INTRAVENOUS | Status: DC
  Filled 2015-07-08: qty 100

## 2015-07-08 MED ORDER — PHENYLEPHRINE HCL 10 MG/ML IJ SOLN
0.0000 ug/min | INTRAVENOUS | Status: DC
  Filled 2015-07-08: qty 2

## 2015-07-08 MED ORDER — ONDANSETRON HCL 4 MG/2ML IJ SOLN: 4.0000 mg | Freq: Four times a day (QID) | INTRAMUSCULAR | Status: DC | PRN

## 2015-07-08 MED ORDER — FENTANYL CITRATE (PF) 250 MCG/5ML IJ SOLN
INTRAMUSCULAR | Status: AC
  Filled 2015-07-08: qty 5

## 2015-07-08 MED ORDER — FLUTICASONE FUROATE-VILANTEROL 200-25 MCG/INH IN AEPB
1.0000 | INHALATION_SPRAY | Freq: Every day | RESPIRATORY_TRACT | Status: DC
  Administered 2015-07-09 – 2015-07-10 (×2): 1 via RESPIRATORY_TRACT
  Filled 2015-07-08: qty 28

## 2015-07-08 MED ORDER — EPHEDRINE SULFATE 50 MG/ML IJ SOLN
INTRAMUSCULAR | Status: DC | PRN
  Administered 2015-07-08 (×2): 10 mg via INTRAVENOUS

## 2015-07-08 MED ORDER — LACTATED RINGERS IV SOLN
INTRAVENOUS | Status: DC | PRN
  Administered 2015-07-08: 07:00:00 via INTRAVENOUS

## 2015-07-08 MED ORDER — PANTOPRAZOLE SODIUM 40 MG PO TBEC: 40.0000 mg | DELAYED_RELEASE_TABLET | Freq: Every day | ORAL | Status: DC

## 2015-07-08 MED ORDER — SODIUM CHLORIDE 0.9 % IV SOLN
INTRAVENOUS | Status: DC
  Administered 2015-07-08: 13:00:00 via INTRAVENOUS

## 2015-07-08 MED ORDER — ACETAMINOPHEN 325 MG PO TABS
650.0000 mg | ORAL_TABLET | Freq: Once | ORAL | Status: AC
  Administered 2015-07-08: 650 mg via ORAL
  Filled 2015-07-08: qty 2

## 2015-07-08 MED ORDER — METOPROLOL TARTRATE 12.5 MG HALF TABLET
12.5000 mg | ORAL_TABLET | Freq: Two times a day (BID) | ORAL | Status: DC
  Administered 2015-07-08 – 2015-07-09 (×2): 12.5 mg via ORAL
  Filled 2015-07-08 (×2): qty 1

## 2015-07-08 MED ORDER — DEXMEDETOMIDINE HCL IN NACL 200 MCG/50ML IV SOLN
0.1000 ug/kg/h | INTRAVENOUS | Status: DC
  Filled 2015-07-08: qty 50

## 2015-07-08 MED ORDER — TRAMADOL HCL 50 MG PO TABS: 50.0000 mg | ORAL_TABLET | ORAL | Status: DC | PRN

## 2015-07-08 MED ORDER — METOPROLOL TARTRATE 5 MG/5ML IV SOLN
2.5000 mg | INTRAVENOUS | Status: DC | PRN
Start: 2015-07-08 — End: 2015-07-09

## 2015-07-08 MED ORDER — IRON POLYSACCH CMPLX-B12-FA 150-0.025-1 MG PO CAPS: 1.0000 | ORAL_CAPSULE | Freq: Two times a day (BID) | ORAL | Status: DC

## 2015-07-08 MED ORDER — ALBUTEROL SULFATE (2.5 MG/3ML) 0.083% IN NEBU: 2.5000 mg | INHALATION_SOLUTION | RESPIRATORY_TRACT | Status: DC | PRN

## 2015-07-08 MED ORDER — LACTATED RINGERS IV SOLN: 500.0000 mL | Freq: Once | INTRAVENOUS | Status: DC | PRN

## 2015-07-08 MED ORDER — ACETAMINOPHEN 160 MG/5ML PO SOLN: 650.0000 mg | Freq: Once | ORAL | Status: DC

## 2015-07-08 MED ORDER — GABAPENTIN 600 MG PO TABS: 300.0000 mg | ORAL_TABLET | Freq: Three times a day (TID) | ORAL | Status: DC | PRN

## 2015-07-08 MED ORDER — VANCOMYCIN HCL IN DEXTROSE 1-5 GM/200ML-% IV SOLN
1000.0000 mg | Freq: Once | INTRAVENOUS | Status: AC
  Administered 2015-07-08: 1000 mg via INTRAVENOUS
  Filled 2015-07-08: qty 200

## 2015-07-08 MED ORDER — CARVEDILOL 3.125 MG PO TABS
ORAL_TABLET | ORAL | Status: AC
  Administered 2015-07-08: 6.25 mg via ORAL
  Filled 2015-07-08: qty 2

## 2015-07-08 MED ORDER — NITROGLYCERIN IN D5W 200-5 MCG/ML-% IV SOLN: 0.0000 ug/min | INTRAVENOUS | Status: DC

## 2015-07-08 MED ORDER — OXYCODONE HCL 5 MG PO TABS: 5.0000 mg | ORAL_TABLET | ORAL | Status: DC | PRN

## 2015-07-08 MED ORDER — METOPROLOL TARTRATE 5 MG/5ML IV SOLN
INTRAVENOUS | Status: AC
  Filled 2015-07-08: qty 5

## 2015-07-08 MED ORDER — ALBUMIN HUMAN 5 % IV SOLN: 250.0000 mL | INTRAVENOUS | Status: DC | PRN

## 2015-07-08 MED FILL — Heparin Sodium (Porcine) Inj 1000 Unit/ML: INTRAMUSCULAR | Qty: 30 | Status: AC

## 2015-07-08 MED FILL — Magnesium Sulfate Inj 50%: INTRAMUSCULAR | Qty: 10 | Status: AC

## 2015-07-08 MED FILL — Potassium Chloride Inj 2 mEq/ML: INTRAVENOUS | Qty: 40 | Status: AC

## 2015-07-08 SURGICAL SUPPLY — 90 items
ADAPTER UNIV SWAN GANZ BIP (ADAPTER) ×1 IMPLANT
ADAPTER UNV SWAN GANZ BIP (ADAPTER) ×2
BAG BANDED W/RUBBER/TAPE 36X54 (MISCELLANEOUS) ×3 IMPLANT
BAG DECANTER FOR FLEXI CONT (MISCELLANEOUS) IMPLANT
BAG SNAP BAND KOVER 36X36 (MISCELLANEOUS) ×6 IMPLANT
BLADE OSCILLATING /SAGITTAL (BLADE) IMPLANT
BLADE STERNUM SYSTEM 6 (BLADE) ×3 IMPLANT
BLADE SURG ROTATE 9660 (MISCELLANEOUS) IMPLANT
CABLE PACING FASLOC BIEGE (MISCELLANEOUS) ×3 IMPLANT
CABLE PACING FASLOC BLUE (MISCELLANEOUS) ×3 IMPLANT
CANNULA FEM VENOUS REMOTE 22FR (CANNULA) IMPLANT
CANNULA OPTISITE PERFUSION 16F (CANNULA) IMPLANT
CANNULA OPTISITE PERFUSION 18F (CANNULA) IMPLANT
CATH DIAG EXPO 6F AL2 (CATHETERS) ×3 IMPLANT
CATH DIAG EXPO 6F VENT PIG 145 (CATHETERS) ×6 IMPLANT
CATH S G BIP PACING (SET/KITS/TRAYS/PACK) ×3 IMPLANT
CLIP TI MEDIUM 24 (CLIP) ×3 IMPLANT
CLIP TI WIDE RED SMALL 24 (CLIP) ×3 IMPLANT
COVER BACK TABLE 24X17X13 BIG (DRAPES) ×3 IMPLANT
COVER DOME SNAP 22 D (MISCELLANEOUS) ×3 IMPLANT
COVER MAYO STAND STRL (DRAPES) ×3 IMPLANT
COVER TABLE BACK 60X90 (DRAPES) ×3 IMPLANT
CRADLE DONUT ADULT HEAD (MISCELLANEOUS) ×3 IMPLANT
DERMABOND ADVANCED (GAUZE/BANDAGES/DRESSINGS) ×4
DERMABOND ADVANCED .7 DNX12 (GAUZE/BANDAGES/DRESSINGS) ×2 IMPLANT
DRAPE INCISE IOBAN 66X45 STRL (DRAPES) IMPLANT
DRAPE SLUSH MACHINE 52X66 (DRAPES) ×3 IMPLANT
DRAPE TABLE COVER HEAVY DUTY (DRAPES) ×3 IMPLANT
DRSG TEGADERM 4X4.75 (GAUZE/BANDAGES/DRESSINGS) ×3 IMPLANT
ELECT REM PT RETURN 9FT ADLT (ELECTROSURGICAL) ×6
ELECTRODE REM PT RTRN 9FT ADLT (ELECTROSURGICAL) ×2 IMPLANT
FELT TEFLON 6X6 (MISCELLANEOUS) ×3 IMPLANT
FEMORAL VENOUS CANN RAP (CANNULA) IMPLANT
GAUZE SPONGE 4X4 12PLY STRL (GAUZE/BANDAGES/DRESSINGS) ×3 IMPLANT
GLOVE BIO SURGEON STRL SZ8 (GLOVE) ×6 IMPLANT
GLOVE EUDERMIC 7 POWDERFREE (GLOVE) ×6 IMPLANT
GLOVE ORTHO TXT STRL SZ7.5 (GLOVE) ×6 IMPLANT
GOWN STRL REUS W/ TWL LRG LVL3 (GOWN DISPOSABLE) ×3 IMPLANT
GOWN STRL REUS W/ TWL XL LVL3 (GOWN DISPOSABLE) ×6 IMPLANT
GOWN STRL REUS W/TWL LRG LVL3 (GOWN DISPOSABLE) ×6
GOWN STRL REUS W/TWL XL LVL3 (GOWN DISPOSABLE) ×12
GUIDEWIRE SAF TJ AMPL .035X180 (WIRE) ×3 IMPLANT
GUIDEWIRE SAFE TJ AMPLATZ EXST (WIRE) ×3 IMPLANT
GUIDEWIRE STRAIGHT .035 260CM (WIRE) ×3 IMPLANT
INSERT FOGARTY 61MM (MISCELLANEOUS) ×3 IMPLANT
INSERT FOGARTY SM (MISCELLANEOUS) ×6 IMPLANT
INSERT FOGARTY XLG (MISCELLANEOUS) IMPLANT
KIT BASIN OR (CUSTOM PROCEDURE TRAY) ×3 IMPLANT
KIT DILATOR VASC 18G NDL (KITS) IMPLANT
KIT HEART LEFT (KITS) ×3 IMPLANT
KIT ROOM TURNOVER OR (KITS) ×3 IMPLANT
KIT SUCTION CATH 14FR (SUCTIONS) ×6 IMPLANT
NEEDLE PERC 18GX7CM (NEEDLE) ×3 IMPLANT
NS IRRIG 1000ML POUR BTL (IV SOLUTION) ×9 IMPLANT
PACK AORTA (CUSTOM PROCEDURE TRAY) ×3 IMPLANT
PAD ARMBOARD 7.5X6 YLW CONV (MISCELLANEOUS) ×6 IMPLANT
PAD ELECT DEFIB RADIOL ZOLL (MISCELLANEOUS) ×3 IMPLANT
SET MICROPUNCTURE 5F STIFF (MISCELLANEOUS) ×3 IMPLANT
SHEATH AVANTI 11CM 8FR (MISCELLANEOUS) ×3 IMPLANT
SHEATH PINNACLE 6F 10CM (SHEATH) ×6 IMPLANT
SLEEVE REPOSITIONING LENGTH 30 (MISCELLANEOUS) ×3 IMPLANT
SPONGE LAP 4X18 X RAY DECT (DISPOSABLE) ×3 IMPLANT
STOPCOCK MORSE 400PSI 3WAY (MISCELLANEOUS) ×3 IMPLANT
SUT ETHIBOND X763 2 0 SH 1 (SUTURE) ×3 IMPLANT
SUT GORETEX CV 4 TH 22 36 (SUTURE) ×3 IMPLANT
SUT GORETEX CV4 TH-18 (SUTURE) ×9 IMPLANT
SUT GORETEX TH-18 36 INCH (SUTURE) ×6 IMPLANT
SUT PROLENE 3 0 SH1 36 (SUTURE) IMPLANT
SUT PROLENE 4 0 RB 1 (SUTURE) ×2
SUT PROLENE 4-0 RB1 .5 CRCL 36 (SUTURE) ×1 IMPLANT
SUT PROLENE 5 0 C 1 36 (SUTURE) ×6 IMPLANT
SUT PROLENE 6 0 C 1 30 (SUTURE) ×6 IMPLANT
SUT SILK  1 MH (SUTURE) ×2
SUT SILK 1 MH (SUTURE) ×1 IMPLANT
SUT SILK 2 0 SH CR/8 (SUTURE) IMPLANT
SUT VIC AB 2-0 CT1 27 (SUTURE) ×2
SUT VIC AB 2-0 CT1 TAPERPNT 27 (SUTURE) ×1 IMPLANT
SUT VIC AB 2-0 CTX 36 (SUTURE) IMPLANT
SUT VIC AB 3-0 SH 8-18 (SUTURE) ×6 IMPLANT
SYR 30ML LL (SYRINGE) ×6 IMPLANT
SYR 50ML LL SCALE MARK (SYRINGE) ×3 IMPLANT
SYRINGE 10CC LL (SYRINGE) ×9 IMPLANT
TOWEL OR 17X26 10 PK STRL BLUE (TOWEL DISPOSABLE) ×6 IMPLANT
TRANSDUCER W/STOPCOCK (MISCELLANEOUS) ×12 IMPLANT
TRAY FOLEY IC TEMP SENS 16FR (CATHETERS) ×3 IMPLANT
TUBING HIGH PRESSURE 120CM (CONNECTOR) ×3 IMPLANT
VALVE HEART TRANSCATH SZ3 29MM (Prosthesis & Implant Heart) ×3 IMPLANT
WIRE .035 3MM-J 145CM (WIRE) ×3 IMPLANT
WIRE AMPLATZ SS-J .035X180CM (WIRE) ×3 IMPLANT
WIRE BENTSON .035X145CM (WIRE) ×3 IMPLANT

## 2015-07-08 NOTE — Progress Notes (Signed)
Patient ID: Ryan Shea, male   DOB: 1945/10/30, 70 y.o.   MRN: [id number]  SICU Evening Rounds:   Hemodynamically stable, sinus 80's  Awake, alert and neuro intact   Urine output ok   Groin sites hemostatic. No hematoma   CBC    Component Value Date/Time   WBC 12.2* 07/08/2015 1700   RBC 3.83* 07/08/2015 1700   RBC 3.17* 04/30/2015 1300   HGB 10.8* 07/08/2015 1700   HCT 33.7* 07/08/2015 1700   PLT 146* 07/08/2015 1700   MCV 88.0 07/08/2015 1700   MCH 28.2 07/08/2015 1700   MCHC 32.0 07/08/2015 1700   RDW 14.3 07/08/2015 1700   LYMPHSABS 1.0 04/17/2015 1112   MONOABS 0.7 04/17/2015 1112   EOSABS 0.1 04/17/2015 1112   BASOSABS 0.0 04/17/2015 1112     BMET    Component Value Date/Time   NA 142 07/08/2015 1658   K 4.9 07/08/2015 1658   CL 104 07/08/2015 1658   CO2 20* 07/04/2015 1122   GLUCOSE 112* 07/08/2015 1658   BUN 16 07/08/2015 1658   CREATININE 1.06 07/08/2015 1700   CREATININE 1.07 06/09/2015 1426   CALCIUM 9.1 07/04/2015 1122   GFRNONAA >60 07/08/2015 1700   GFRNONAA 70 06/09/2015 1426   GFRAA >60 07/08/2015 1700   GFRAA 81 06/09/2015 1426     A/P:  Stable postop course. Continue current plans

## 2015-07-08 NOTE — Anesthesia Postprocedure Evaluation (Signed)
Anesthesia Post Note  Patient: MARKEZ ULRICH  Procedure(s) Performed: Procedure(s) (LRB): TRANSCATHETER AORTIC VALVE REPLACEMENT, TRANSFEMORAL (N/A) TRANSESOPHAGEAL ECHOCARDIOGRAM (TEE) (N/A)  Patient location during evaluation: ICU Anesthesia Type: General Level of consciousness: awake and alert Pain management: pain level controlled Vital Signs Assessment: post-procedure vital signs reviewed and stable Respiratory status: spontaneous breathing, nonlabored ventilation, respiratory function stable and patient connected to nasal cannula oxygen Cardiovascular status: blood pressure returned to baseline and stable Postop Assessment: no signs of nausea or vomiting Anesthetic complications: no    Last Vitals:  Filed Vitals:   07/08/15 2000 07/08/15 2100  BP: 91/55 96/69  Pulse: 76 88  Temp:    Resp: 16 19    Last Pain:  Filed Vitals:   07/08/15 2106  PainSc: 0-No pain                 Nels Munn A

## 2015-07-08 NOTE — Progress Notes (Signed)
  Echocardiogram Echocardiogram Transesophageal has been performed.  Jennette Dubin 07/08/2015, 10:14 AM

## 2015-07-08 NOTE — Anesthesia Preprocedure Evaluation (Addendum)
Anesthesia Evaluation  Patient identified by MRN, date of birth, ID band Patient awake    Reviewed: Allergy & Precautions, NPO status , Patient's Chart, lab work & pertinent test results, reviewed documented beta blocker date and time   History of Anesthesia Complications Negative for: history of anesthetic complications  Airway Mallampati: III  TM Distance: >3 FB Neck ROM: Full    Dental  (+) Edentulous Upper, Edentulous Lower, Dental Advisory Given   Pulmonary asthma , COPD, former smoker,     + wheezing      Cardiovascular hypertension, Pt. on medications and Pt. on home beta blockers + Past MI  + Valvular Problems/Murmurs AS and MR  Rhythm:Regular Rate:Normal     Neuro/Psych negative neurological ROS  negative psych ROS   GI/Hepatic GERD  Medicated and Controlled,  Endo/Other  Morbid obesity  Renal/GU CRFRenal disease     Musculoskeletal   Abdominal   Peds  Hematology   Anesthesia Other Findings   Reproductive/Obstetrics                            Anesthesia Physical Anesthesia Plan  ASA: IV  Anesthesia Plan: General   Post-op Pain Management:    Induction: Intravenous  Airway Management Planned: Oral ETT  Additional Equipment: Ultrasound Guidance Line Placement, Arterial line and TEE  Intra-op Plan:   Post-operative Plan: Possible Post-op intubation/ventilation  Informed Consent: I have reviewed the patients History and Physical, chart, labs and discussed the procedure including the risks, benefits and alternatives for the proposed anesthesia with the patient or authorized representative who has indicated his/her understanding and acceptance.   Dental advisory given  Plan Discussed with: CRNA and Surgeon  Anesthesia Plan Comments:         Anesthesia Quick Evaluation

## 2015-07-08 NOTE — Transfer of Care (Signed)
Immediate Anesthesia Transfer of Care Note  Patient: Ryan Shea  Procedure(s) Performed: Procedure(s): TRANSCATHETER AORTIC VALVE REPLACEMENT, TRANSFEMORAL (N/A) TRANSESOPHAGEAL ECHOCARDIOGRAM (TEE) (N/A)  Patient Location: ICU  Anesthesia Type:General  Level of Consciousness: lethargic and responds to stimulation  Airway & Oxygen Therapy: Patient Spontanous Breathing and Patient connected to nasal cannula oxygen  Post-op Assessment: Report given to RN  Post vital signs: Reviewed and stable  Last Vitals:  Filed Vitals:   07/08/15 0557  BP: 150/81  Pulse: 99  Temp: 37.2 C  Resp: 20    Last Pain: There were no vitals filed for this visit.    Patients Stated Pain Goal: 3 ([id number])  Complications: No apparent anesthesia complications

## 2015-07-08 NOTE — Anesthesia Procedure Notes (Addendum)
Procedure Name: Intubation Date/Time: 07/08/2015 8:14 AM Performed by: Sampson Si E Pre-anesthesia Checklist: Patient identified, Emergency Drugs available, Suction available, Patient being monitored and Timeout performed Patient Re-evaluated:Patient Re-evaluated prior to inductionOxygen Delivery Method: Circle system utilized Preoxygenation: Pre-oxygenation with 100% oxygen Intubation Type: IV induction Ventilation: Mask ventilation without difficulty, Oral airway inserted - appropriate to patient size and Two handed mask ventilation required Laryngoscope Size: Mac and 4 Grade View: Grade I Tube type: Oral Tube size: 8.0 mm Number of attempts: 1 Airway Equipment and Method: Stylet Placement Confirmation: ETT inserted through vocal cords under direct vision,  positive ETCO2 and breath sounds checked- equal and bilateral Secured at: 23 cm Tube secured with: Tape Dental Injury: Teeth and Oropharynx as per pre-operative assessment    Central Venous Catheter Insertion Performed by: anesthesiologist Patient location: Pre-op. Preanesthetic checklist: patient identified, IV checked, site marked, risks and benefits discussed, surgical consent, monitors and equipment checked, pre-op evaluation, timeout performed and anesthesia consent Position: Trendelenburg Lidocaine 1% used for infiltration Landmarks identified and Seldinger technique used Catheter size: 8 Fr Central line was placed.Double lumen Procedure performed using ultrasound guided technique. Attempts: 1 Following insertion, dressing applied, line sutured and Biopatch. Post procedure assessment: blood return through all ports, free fluid flow and no air. Patient tolerated the procedure well with no immediate complications.

## 2015-07-08 NOTE — Interval H&P Note (Signed)
History and Physical Interval Note:  07/08/2015 5:49 AM  Ryan Shea  has presented today for surgery, with the diagnosis of SEVERE AS  The various methods of treatment have been discussed with the patient and family. After consideration of risks, benefits and other options for treatment, the patient has consented to  Procedure(s): TRANSCATHETER AORTIC VALVE REPLACEMENT, TRANSFEMORAL (N/A) TRANSESOPHAGEAL ECHOCARDIOGRAM (TEE) (N/A) as a surgical intervention .  The patient's history has been reviewed, patient examined, no change in status, stable for surgery.  I have reviewed the patient's chart and labs.  Questions were answered to the patient's satisfaction.     Gaye Pollack

## 2015-07-08 NOTE — Op Note (Signed)
[facility]   MULTIDISCIPLINARY HEART VALVE TEAM   TAVR OPERATIVE NOTE   Date of Procedure:  07/08/2015  Preoperative Diagnosis: Severe Aortic Stenosis   Postoperative Diagnosis: Same   Procedure:    Transcatheter Aortic Valve Replacement - Percutaneous Right Transfemoral Approach  Edwards Sapien 3 THV (size 29 mm, model # 9600TFX, serial # [id number])   Co-Surgeons:  Gaye Pollack, MD and Lauree Chandler, MD      Anesthesiologist:  Laurie Panda, MD  Echocardiographer:  Ena Dawley, MD  Pre-operative Echo Findings:   severe aortic stenosis   normal left ventricular systolic function   Post-operative Echo Findings:  no paravalvular leak  normal left ventricular systolic function    BRIEF CLINICAL NOTE AND INDICATIONS FOR SURGERY  The patient is a 70 year old gentleman with a history of COPD, atrial flutter, hypertension, hyperlipidemia, stage 3 CKD, and GI blood loss with a history of duodenal ulcers and H. Pylori gastritis and chronic anemia who was admitted in January with acute respiratory failure and chest pain. He ruled in for a NSTEMI felt to be due to demand ischemia in the setting of pneumonia and respiratory failure. An echo on 03/24/2015 showed severe AS with a peak velocity of 415 cm/s and a mean gradient of 36 mm Hg. He underwent cardiac cath on 03/26/2015 showing no significant coronary disease and severe AS with a mean gradient of 33 mm Hg and an AVA of 0.95 cm2.  During the course of the patient's preoperative work up they have been evaluated comprehensively by a multidisciplinary team of specialists coordinated through the [facility] in the [facility].  They have been demonstrated to suffer from symptomatic severe aortic stenosis as noted above. The patient has been counseled extensively as to the relative risks and benefits of all options for the treatment of severe aortic stenosis  including long term medical therapy, conventional surgery for aortic valve replacement, and transcatheter aortic valve replacement.  The patient has been independently evaluated by two cardiac surgeons including myself and Dr. Roxy Manns, and they are felt to be at intermediate risk for conventional surgical aortic valve replacement due to his severe COPD.  Based upon review of all of the patient's preoperative diagnostic tests they are felt to be candidate for transcatheter aortic valve replacement using the transfemoral approach as an alternative to higher risk conventional surgery.    Following the decision to proceed with transcatheter aortic valve replacement, a discussion has been held regarding what types of management strategies would be attempted intraoperatively in the event of life-threatening complications, including whether or not the patient would be considered a candidate for the use of cardiopulmonary bypass and/or conversion to open sternotomy for attempted surgical intervention.  The patient has been advised of a variety of complications that might develop peculiar to this approach including but not limited to risks of death, stroke, paravalvular leak, aortic dissection or other major vascular complications, aortic annulus rupture, device embolization, cardiac rupture or perforation, acute myocardial infarction, arrhythmia, heart block or bradycardia requiring permanent pacemaker placement, congestive heart failure, respiratory failure, renal failure, pneumonia, infection, other late complications related to structural valve deterioration or migration, or other complications that might ultimately cause a temporary or permanent loss of functional independence or other long term morbidity.  The patient provides full informed consent for the procedure as described and all questions were answered preoperatively.    DETAILS OF THE OPERATIVE PROCEDURE  PREPARATION:  The patient is brought to the  operating room on the above mentioned date and central monitoring was established by the anesthesia team including placement of Swan-Ganz catheter and radial arterial line. The patient is placed in the supine position on the operating table.  Intravenous antibiotics are administered.  General endotracheal anesthesia is induced uneventfully.  A Foley catheter is placed.  Baseline transesophageal echocardiogram was performed. The patient's chest, abdomen, both groins, and both lower extremities are prepared and draped in a sterile manner. A time out procedure is performed.   Peripheral and transfemoral access were performed by Dr. Angelena Form and will be dictated in his note. BAV was not performed in this patient.    TRANSCATHETER HEART VALVE DEPLOYMENT:   An Edwards Sapien 3 transcatheter heart valve (size 29 mm, model #9600TFX, serial [id number]) was prepared and crimped per manufacturer's guidelines, and the proper orientation of the valve is confirmed on the [employer] delivery system. The valve was advanced through the introducer sheath using normal technique until in an appropriate position in the abdominal aorta beyond the sheath tip. The balloon was then retracted and using the fine-tuning wheel was centered on the valve. The valve was then advanced across the aortic arch using appropriate flexion of the catheter. The valve was carefully positioned across the aortic valve annulus. The Commander catheter was retracted using normal technique. Once final position of the valve has been confirmed by angiographic assessment, the valve is deployed while temporarily holding ventilation and during rapid ventricular pacing to maintain systolic blood pressure < 50 mmHg and pulse pressure < 10 mmHg. The balloon inflation is held for >3 seconds after reaching full deployment volume. Once the balloon has fully deflated the balloon is retracted into the ascending aorta and valve function is assessed using  echocardiography. There is felt to be no paravalvular leak and no central aortic insufficiency.  The patient's hemodynamic recovery following valve deployment is good.  The deployment balloon and guidewire are both removed. Echo demostrated acceptable post-procedural gradients, stable mitral valve function, and no aortic insufficiency.    PROCEDURE COMPLETION:   The sheath was removed and femoral artery closure performed by Dr Angelena Form. Please see his separate report for details.  Protamine was administered once femoral arterial repair was complete. The temporary pacemaker, pigtail catheters and femoral sheaths were removed with manual pressure used for hemostasis.   The patient tolerated the procedure well and is transported to the surgical intensive care in stable condition. There were no immediate intraoperative complications. All sponge instrument and needle counts are verified correct at completion of the operation.   No blood products were administered during the operation.  The patient received a total of 89.5 mL of intravenous contrast during the procedure.   Gaye Pollack, MD 07/08/2015

## 2015-07-08 NOTE — CV Procedure (Signed)
[facility]  TAVR OPERATIVE NOTE   Date of Procedure:  07/08/2015  Preoperative Diagnosis: Severe Aortic Stenosis   Postoperative Diagnosis: Same   Procedure:    Transcatheter Aortic Valve Replacement - Transfemoral Approach  Edwards Sapien 3 THV (size 29 mm, model # 9600TFX, serial # [id number])   Co-Surgeons:  Gaye Pollack, MD and Lauree Chandler, MD  Anesthesiologist:  Ermalene Postin  Echocardiographer:  Meda Coffee  Pre-operative Echo Findings:  Severe aortic stenosis  Normal left ventricular systolic function  Post-operative Echo Findings:  No paravalvular leak  Normal left ventricular systolic function  BRIEF CLINICAL NOTE AND INDICATIONS FOR SURGERY  Patient is 70 year old obese white male with history of aortic stenosis, congestive heart failure, hypertension, COPD, atrial flutter on warfarin anticoagulation, recurrent GI bleeding, and hyperlipidemia who is here today for TAVr. The patient was first diagnosed with congestive heart failure in 2015. He was referred for cardiology consult and evaluated by Dr. Bronson Ing 04/23/2013. At that time he was found to be in atrial flutter. He was started on a beta blocker and warfarin anticoagulation. At that time an echocardiogram revealed normal left ventricular systolic function with ejection fraction estimated 50-55%. There was moderate aortic stenosis with peak velocity across the aortic valve reported 3.1 m/s corresponding to mean transvalvular gradient estimated 20 mmHg. He was seen in follow-up several months later which time he was doing much better on medical therapy. Approximately one year ago he suffered an upper GI bleed while he was anticoagulated on Coumadin. He required a total of 4 units of packed red blood cells for severe acute blood loss anemia. EGD performed at that time revealed 2 duodenal ulcers and H. Pylori gastritis. In June 2016 he suffered a second episode of GI bleeding related to external  hemorrhoids. Colonoscopy revealed mild diverticular disease and small external hemorrhoids. He did not require transfusion at that time. In December 2016 the patient was hospitalized briefly with acute respiratory failure felt secondary to COPD exacerbation. He was hospitalized again in early January of this year with acute on chronic respiratory failure with hypoxia. He was treated for presumed COPD exacerbation and pneumonia but ruled in for an acute non-ST segment elevation myocardial infarction with peak troponin measured 1.41. Echocardiogram performed at that time revealed severe aortic stenosis with peak velocity across the aortic valve measured 4.1 m per sec and corresponding to mean transvalvular gradient estimated 36 mmHg. Left ventricular systolic function remained normal with ejection fraction estimated at 55-60%. Diagnostic cardiac catheterization was notable for mild nonobstructive coronary artery disease but confirmed the presence of severe aortic stenosis with peak to peak and mean transvalvular gradients measured 39 and 33 mmHg, respectively, corresponding to aortic valve area calculated 0.95 cm. He recovered uneventfully and was discharged home but readmitted in early February with symptomatic anemia and Hemoccult positive stool. He required 2 units packed red blood cells. He underwent capsule endoscopy that reportedly demonstrated small bowel erosions felt secondary to aspirin use. He has been seen by both of our CT surgeons on the TAVR team. He is felt to be high risk for conventional AVR due to severe COPD.   During the course of the patient's preoperative work up they have been evaluated comprehensively by a multidisciplinary team of specialists coordinated through the [facility] in the [facility].  They have been demonstrated to suffer from symptomatic severe aortic stenosis as noted above. The patient has been counseled  extensively as to the relative risks  and benefits of all options for the treatment of severe aortic stenosis including long term medical therapy, conventional surgery for aortic valve replacement, and transcatheter aortic valve replacement.  The patient has been independently evaluated by two cardiac surgeons including Dr Roxy Manns and Dr. Cyndia Bent, and they are felt to be at high risk for conventional surgical aortic valve replacement. Both surgeons indicated the patient would be a poor candidate for conventional surgery. Based upon review of all of the patient's preoperative diagnostic tests they are felt to be candidate for transcatheter aortic valve replacement using the transfemoral approach as an alternative to high risk conventional surgery.    Following the decision to proceed with transcatheter aortic valve replacement, a discussion has been held regarding what types of management strategies would be attempted intraoperatively in the event of life-threatening complications, including whether or not the patient would be considered a candidate for the use of cardiopulmonary bypass and/or conversion to open sternotomy for attempted surgical intervention.  The patient has been advised of a variety of complications that might develop peculiar to this approach including but not limited to risks of death, stroke, paravalvular leak, aortic dissection or other major vascular complications, aortic annulus rupture, device embolization, cardiac rupture or perforation, acute myocardial infarction, arrhythmia, heart block or bradycardia requiring permanent pacemaker placement, congestive heart failure, respiratory failure, renal failure, pneumonia, infection, other late complications related to structural valve deterioration or migration, or other complications that might ultimately cause a temporary or permanent loss of functional independence or other long term morbidity.  The patient provides full informed consent for the  procedure as described and all questions were answered preoperatively.    DETAILS OF THE OPERATIVE PROCEDURE  PREPARATION:   The patient is brought to the operating room on the above mentioned date and central monitoring was established by the anesthesia team including placement of Swan-Ganz catheter and radial arterial line. The patient is placed in the supine position on the operating table.  Intravenous antibiotics are administered. General endotracheal anesthesia is induced uneventfully. A Foley catheter is placed.  Baseline transesophageal echocardiogram was performed. The patient's chest, abdomen, both groins, and both lower extremities are prepared and draped in a sterile manner. A time out procedure is performed.   PERIPHERAL ACCESS:   Using the modified Seldinger technique, femoral arterial and venous access were obtained with placement of 6 Fr sheaths on the left side.  A pigtail diagnostic catheter was passed through the femoral arterial sheath under fluoroscopic guidance into the aortic root.  A temporary transvenous pacemaker catheter was passed through the femoral venous sheath under fluoroscopic guidance into the right ventricle.  The pacemaker was tested to ensure stable lead placement and pacemaker capture. Aortic root angiography was performed in order to determine the optimal angiographic angle for valve deployment.  TRANSFEMORAL ACCESS:  A percutaneous approach was used to cannulate the femoral artery. A micropuncture needle was used to access the right femoral artery and a micropuncture catheter was placed over a wire. Contrast was injected to confirm appropriate location of the arterial stick in the femoral artery. A 6 French sheath was placed in the right femoral artery. A double ProGlide pre-closure technique was used to deliver 2 ProGlide devices. The patient was heparinized systemically and ACT verified > 250 seconds.    A 16 Fr transfemoral E-sheath was introduced into  the right femoral artery after progressively dilating over an Amplatz superstiff wire. An AL-1 catheter was used to direct a straight-tip exchange length wire across the  native aortic valve into the left ventricle. This was exchanged out for a pigtail catheter and position was confirmed in the LV apex. Simultaneous LV and Ao pressures were recorded.  The pigtail catheter was then exchanged for an Amplatz Extra-stiff wire in the LV apex. No BAV performed.    TRANSCATHETER HEART VALVE DEPLOYMENT:  An Edwards Sapien 3 THV (size 29 mm) was prepared and crimped per manufacturer's guidelines, and the proper orientation of the valve is confirmed on the [employer] delivery system. The valve was advanced through the introducer sheath using normal technique until in an appropriate position in the abdominal aorta beyond the sheath tip. The balloon was then retracted and using the fine-tuning wheel was centered on the valve. The valve was then advanced across the aortic arch using appropriate flexion of the catheter. The valve was carefully positioned across the aortic valve annulus. The Commander catheter was retracted using normal technique. Once final position of the valve has been confirmed by angiographic assessment, the valve is deployed while temporarily holding ventilation and during rapid ventricular pacing to maintain systolic blood pressure < 50 mmHg and pulse pressure < 10 mmHg. The balloon inflation is held for >3 seconds after reaching full deployment volume. Once the balloon has fully deflated the balloon is retracted into the ascending aorta and valve function is assessed using TEE. There is felt to be no paravalvular leak and no central aortic insufficiency.  The patient's hemodynamic recovery following valve deployment is good.  The deployment balloon and guidewire are both removed. Echo demostrated acceptable post-procedural gradients, stable mitral valve function, and no AI.    PROCEDURE  COMPLETION:  The sheath was then removed the ProGlide sutures was pulled down to the vessel and cut. Protamine was administered once femoral arterial closure was complete. The temporary pacemaker, pigtail catheters and femoral sheaths were removed with manual pressure used for hemostasis.   The patient tolerated the procedure well and is transported to the surgical intensive care in stable condition. There were no immediate intraoperative complications. All sponge instrument and needle counts are verified correct at completion of the operation.   No blood products were administered during the operation.  The patient received a total of  89.5 mL of intravenous contrast during the procedure.  Bharat Antillon MD 07/08/2015 10:31 AM

## 2015-07-09 ENCOUNTER — Inpatient Hospital Stay (HOSPITAL_COMMUNITY): Payer: Medicare PPO

## 2015-07-09 ENCOUNTER — Encounter (HOSPITAL_COMMUNITY): Payer: Self-pay | Admitting: Cardiovascular Disease

## 2015-07-09 ENCOUNTER — Other Ambulatory Visit: Payer: Self-pay | Admitting: *Deleted

## 2015-07-09 DIAGNOSIS — Z954 Presence of other heart-valve replacement: Secondary | ICD-10-CM

## 2015-07-09 DIAGNOSIS — I359 Nonrheumatic aortic valve disorder, unspecified: Secondary | ICD-10-CM

## 2015-07-09 DIAGNOSIS — I350 Nonrheumatic aortic (valve) stenosis: Principal | ICD-10-CM

## 2015-07-09 LAB — BASIC METABOLIC PANEL WITH GFR
Anion gap: 5 (ref 5–15)
BUN: 16 mg/dL (ref 6–20)
CO2: 30 mmol/L (ref 22–32)
Calcium: 8.4 mg/dL — ABNORMAL LOW (ref 8.9–10.3)
Chloride: 105 mmol/L (ref 101–111)
Creatinine, Ser: 1.18 mg/dL (ref 0.61–1.24)
GFR calc Af Amer: >60 mL/min
GFR calc non Af Amer: >60 mL/min
Glucose, Bld: 92 mg/dL (ref 65–99)
Potassium: 4.2 mmol/L (ref 3.5–5.1)
Sodium: 140 mmol/L (ref 135–145)

## 2015-07-09 LAB — CBC
HEMATOCRIT: 31.7 % — AB (ref 39.0–52.0)
HEMOGLOBIN: 10.2 g/dL — AB (ref 13.0–17.0)
MCH: 28.8 pg (ref 26.0–34.0)
MCHC: 32.2 g/dL (ref 30.0–36.0)
MCV: 89.5 fL (ref 78.0–100.0)
Platelets: 138 10*3/uL — ABNORMAL LOW (ref 150–400)
RBC: 3.54 MIL/uL — ABNORMAL LOW (ref 4.22–5.81)
RDW: 14.3 % (ref 11.5–15.5)
WBC: 9.1 10*3/uL (ref 4.0–10.5)

## 2015-07-09 LAB — ECHOCARDIOGRAM COMPLETE
HEIGHTINCHES: 66 in
Weight: 4010.61 oz

## 2015-07-09 LAB — MAGNESIUM: Magnesium: 1.8 mg/dL (ref 1.7–2.4)

## 2015-07-09 LAB — GLUCOSE, CAPILLARY
GLUCOSE-CAPILLARY: 100 mg/dL — AB (ref 65–99)
GLUCOSE-CAPILLARY: 87 mg/dL (ref 65–99)
Glucose-Capillary: 144 mg/dL — ABNORMAL HIGH (ref 65–99)
Glucose-Capillary: 90 mg/dL (ref 65–99)

## 2015-07-09 LAB — PROTIME-INR
INR: 1.18 (ref 0.00–1.49)
Prothrombin Time: 15.1 seconds (ref 11.6–15.2)

## 2015-07-09 LAB — TRYPTASE: Tryptase: 10.8 ug/L (ref 2.2–13.2)

## 2015-07-09 MED ORDER — MOVING RIGHT ALONG BOOK
Freq: Once | Status: AC
  Administered 2015-07-09: 14:00:00
  Filled 2015-07-09: qty 1

## 2015-07-09 MED ORDER — CLOPIDOGREL BISULFATE 75 MG PO TABS
75.0000 mg | ORAL_TABLET | Freq: Every day | ORAL | Status: DC
  Administered 2015-07-10: 75 mg via ORAL
  Filled 2015-07-09: qty 1

## 2015-07-09 MED ORDER — ASPIRIN 81 MG PO CHEW
81.0000 mg | CHEWABLE_TABLET | Freq: Every day | ORAL | Status: DC
  Administered 2015-07-10: 81 mg via ORAL
  Filled 2015-07-09: qty 1

## 2015-07-09 MED ORDER — ACETAMINOPHEN 325 MG PO TABS: 650.0000 mg | ORAL_TABLET | Freq: Four times a day (QID) | ORAL | Status: DC | PRN

## 2015-07-09 MED ORDER — PANTOPRAZOLE SODIUM 40 MG PO TBEC
40.0000 mg | DELAYED_RELEASE_TABLET | Freq: Every day | ORAL | Status: DC
  Administered 2015-07-10: 40 mg via ORAL
  Filled 2015-07-09: qty 1

## 2015-07-09 MED ORDER — ZOLPIDEM TARTRATE 5 MG PO TABS: 5.0000 mg | ORAL_TABLET | Freq: Every evening | ORAL | Status: DC | PRN

## 2015-07-09 MED ORDER — TRAMADOL HCL 50 MG PO TABS: 50.0000 mg | ORAL_TABLET | ORAL | Status: DC | PRN

## 2015-07-09 MED ORDER — SODIUM CHLORIDE 0.9 % IV SOLN: 250.0000 mL | INTRAVENOUS | Status: DC | PRN

## 2015-07-09 MED ORDER — BISACODYL 5 MG PO TBEC: 10.0000 mg | DELAYED_RELEASE_TABLET | Freq: Every day | ORAL | Status: DC | PRN

## 2015-07-09 MED ORDER — BISACODYL 10 MG RE SUPP: 10.0000 mg | Freq: Every day | RECTAL | Status: DC | PRN

## 2015-07-09 MED ORDER — CARVEDILOL 6.25 MG PO TABS
6.2500 mg | ORAL_TABLET | Freq: Two times a day (BID) | ORAL | Status: DC
  Administered 2015-07-09 – 2015-07-10 (×2): 6.25 mg via ORAL
  Filled 2015-07-09 (×2): qty 1

## 2015-07-09 MED ORDER — DOCUSATE SODIUM 100 MG PO CAPS
200.0000 mg | ORAL_CAPSULE | Freq: Every day | ORAL | Status: DC
  Administered 2015-07-09: 100 mg via ORAL
  Filled 2015-07-09 (×2): qty 2

## 2015-07-09 MED ORDER — ONDANSETRON HCL 4 MG/2ML IJ SOLN: 4.0000 mg | Freq: Four times a day (QID) | INTRAMUSCULAR | Status: DC | PRN

## 2015-07-09 MED ORDER — SODIUM CHLORIDE 0.9% FLUSH: 3.0000 mL | INTRAVENOUS | Status: DC | PRN

## 2015-07-09 MED ORDER — SODIUM CHLORIDE 0.9% FLUSH
3.0000 mL | Freq: Two times a day (BID) | INTRAVENOUS | Status: DC
Start: 2015-07-09 — End: 2015-07-10
  Administered 2015-07-09 – 2015-07-10 (×2): 3 mL via INTRAVENOUS

## 2015-07-09 MED ORDER — ONDANSETRON HCL 4 MG PO TABS: 4.0000 mg | ORAL_TABLET | Freq: Four times a day (QID) | ORAL | Status: DC | PRN

## 2015-07-09 NOTE — Progress Notes (Signed)
  Echocardiogram 2D Echocardiogram has been performed.  Ryan Shea 07/09/2015, 12:09 PM

## 2015-07-09 NOTE — Care Management Note (Signed)
Case Management Note  Patient Details  Name: Ryan Shea MRN: [id number] Date of Birth: Feb 25, 1946  Subjective/Objective:     Pt OOB to chair, reports he feels well and is looking forward to increased activity level as he recovers from surgery.  Lives with spouse who will provide 24/7 assistance when he is discharged.                       Expected Discharge Plan:  Home/Self Care  Discharge planning Services  CM Consult  Status of Service:  In process, will continue to follow  Girard Cooter, RN 07/09/2015, 10:39 AM

## 2015-07-09 NOTE — Progress Notes (Signed)
     SUBJECTIVE: No dyspnea or chest pain.   BP 113/59 mmHg  Pulse 86  Temp(Src) 97.8 F (36.6 C) (Oral)  Resp 12  Ht 5\' 6"  (1.676 m)  Wt 250 lb 10.6 oz (113.7 kg)  BMI 40.48 kg/m2  SpO2 96%  Intake/Output Summary (Last 24 hours) at 07/09/15 0837 Last data filed at 07/09/15 0600  Gross per 24 hour  Intake   3190 ml  Output    795 ml  Net   2395 ml    PHYSICAL EXAM General: Well developed, well nourished, in no acute distress. Alert and oriented x 3.  Psych:  Good affect, responds appropriately Neck: No JVD. No masses noted.  Lungs: Clear bilaterally with no wheezes or rhonci noted.  Heart: RRR with no murmurs noted. Abdomen: Bowel sounds are present. Soft, non-tender.  Extremities: No lower extremity edema.   LABS: Basic Metabolic Panel:  Recent Labs  07/08/15 1658 07/08/15 1700 07/09/15 0255  NA 142  --  140  K 4.9  --  4.2  CL 104  --  105  CO2  --   --  30  GLUCOSE 112*  --  92  BUN 16  --  16  CREATININE 1.00 1.06 1.18  CALCIUM  --   --  8.4*  MG  --  1.8 1.8   CBC:  Recent Labs  07/08/15 1700 07/09/15 0255  WBC 12.2* 9.1  HGB 10.8* 10.2*  HCT 33.7* 31.7*  MCV 88.0 89.5  PLT 146* 138*   Current Meds: . acetaminophen  1,000 mg Oral Q6H   Or  . acetaminophen (TYLENOL) oral liquid 160 mg/5 mL  1,000 mg Per Tube Q6H  . allopurinol  100 mg Oral Daily  . aspirin EC  325 mg Oral Daily   Or  . aspirin  324 mg Per Tube Daily  . cefUROXime (ZINACEF)  IV  1.5 g Intravenous Q12H  . chlorhexidine  15 mL Mouth/Throat NOW  . clopidogrel  75 mg Oral Q breakfast  . colchicine  0.6 mg Oral Daily  . famotidine (PEPCID) IV  20 mg Intravenous Q12H  . ferrous [id number] C-folic acid  1 capsule Oral BID WC  . ferrous sulfate  325 mg Oral QHS  . fluticasone furoate-vilanterol  1 puff Inhalation Daily  . magnesium sulfate  4 g Intravenous Once  . metoprolol tartrate  12.5 mg Oral BID   Or  . metoprolol tartrate  12.5 mg Per Tube BID  . [START ON  07/10/2015] pantoprazole  40 mg Oral Daily  . pravastatin  40 mg Oral QHS     ASSESSMENT AND PLAN:  1. Severe aortic valve stenosis: He is POD # 1 s/p TAVR. He is stable this am. He is on ASA and Plavix. Will arrange echo today. Will transfer to telemetry unit. Likely d/c home tomorrow.      Ryan Shea  5/3/20178:37 AM

## 2015-07-09 NOTE — Progress Notes (Signed)
Patient arrived to unit from 2S. Family and belongings at bedside. No complaints at this time.

## 2015-07-09 NOTE — Progress Notes (Addendum)
1 Day Post-Op Procedure(s) (LRB): TRANSCATHETER AORTIC VALVE REPLACEMENT, TRANSFEMORAL (N/A) TRANSESOPHAGEAL ECHOCARDIOGRAM (TEE) (N/A) Subjective:  No complaints, ambulated  Objective: Vital signs in last 24 hours: Temp:  [97.5 F (36.4 C)-97.8 F (36.6 C)] 97.8 F (36.6 C) (05/03 0449) Pulse Rate:  [76-96] 86 (05/03 0600) Cardiac Rhythm:  [-] Normal sinus rhythm (05/03 0400) Resp:  [12-19] 12 (05/03 0600) BP: (89-123)/(48-72) 113/59 mmHg (05/03 0600) SpO2:  [93 %-100 %] 96 % (05/03 0741) Arterial Line BP: (53-185)/(42-59) 53/43 mmHg (05/02 2200) Weight:  [113.7 kg (250 lb 10.6 oz)] 113.7 kg (250 lb 10.6 oz) (05/03 0500)  Hemodynamic parameters for last 24 hours:    Intake/Output from previous day: 05/02 0701 - 05/03 0700 In: 3190 [P.O.:240; I.V.:2150; IV Piggyback:800] Out: 795 [Urine:795] Intake/Output this shift:    General appearance: alert and cooperative Neurologic: intact Heart: regular rate and rhythm, S1, S2 normal, no murmur, click, rub or gallop Lungs: clear to auscultation bilaterally Extremities: extremities normal, atraumatic, no cyanosis or edema Wound: both groin sites look good.  Lab Results:  Recent Labs  07/08/15 1700 07/09/15 0255  WBC 12.2* 9.1  HGB 10.8* 10.2*  HCT 33.7* 31.7*  PLT 146* 138*   BMET:  Recent Labs  07/08/15 1658 07/08/15 1700 07/09/15 0255  NA 142  --  140  K 4.9  --  4.2  CL 104  --  105  CO2  --   --  30  GLUCOSE 112*  --  92  BUN 16  --  16  CREATININE 1.00 1.06 1.18  CALCIUM  --   --  8.4*    PT/INR:  Recent Labs  07/09/15 0255  LABPROT 15.1  INR 1.18   ABG    Component Value Date/Time   PHART 7.246* 07/08/2015 1126   HCO3 27.3* 07/08/2015 1126   TCO2 29 07/08/2015 1658   ACIDBASEDEF 1.0 07/08/2015 1126   O2SAT 95.0 07/08/2015 1126   CBG (last 3)   Recent Labs  07/08/15 1132 07/08/15 2244 07/09/15 0356  GLUCAP 137* 144* 100*   CXR: ok  Assessment/Plan: S/P Procedure(s)  (LRB): TRANSCATHETER AORTIC VALVE REPLACEMENT, TRANSFEMORAL (N/A) TRANSESOPHAGEAL ECHOCARDIOGRAM (TEE) (N/A)  POD 1 He has been hemodynamically stable in sinus rhythm. Plan 2D echo today. DC foley Will leave central line in today for access since he has poor veins and should be able to remove it tomorrow. He looks great and is ambulating, feels well so should be able to go home tomorrow if no changes. ASA and Plavix ordered for valve. He was on ASA 81 mg and coumadin preop for atrial flutter history but has hx of GI bleeding and chronic multifactorial anemia. Will discuss with Dr. Angelena Form.  Transfer to 2W  LOS: 1 day    Gaye Pollack 07/09/2015

## 2015-07-10 LAB — BASIC METABOLIC PANEL
ANION GAP: 10 (ref 5–15)
BUN: 10 mg/dL (ref 6–20)
CO2: 29 mmol/L (ref 22–32)
Calcium: 8.9 mg/dL (ref 8.9–10.3)
Chloride: 103 mmol/L (ref 101–111)
Creatinine, Ser: 0.98 mg/dL (ref 0.61–1.24)
GFR calc Af Amer: >60 mL/min (ref >60)
GLUCOSE: 95 mg/dL (ref 65–99)
POTASSIUM: 3.7 mmol/L (ref 3.5–5.1)
Sodium: 142 mmol/L (ref 135–145)

## 2015-07-10 LAB — CBC
HEMATOCRIT: 34 % — AB (ref 39.0–52.0)
HEMOGLOBIN: 10.8 g/dL — AB (ref 13.0–17.0)
MCH: 28.2 pg (ref 26.0–34.0)
MCHC: 31.8 g/dL (ref 30.0–36.0)
MCV: 88.8 fL (ref 78.0–100.0)
Platelets: 144 10*3/uL — ABNORMAL LOW (ref 150–400)
RBC: 3.83 MIL/uL — ABNORMAL LOW (ref 4.22–5.81)
RDW: 14.3 % (ref 11.5–15.5)
WBC: 9.1 10*3/uL (ref 4.0–10.5)

## 2015-07-10 MED ORDER — ZOLPIDEM TARTRATE 5 MG PO TABS
5.0000 mg | ORAL_TABLET | Freq: Every evening | ORAL | Status: DC | PRN
  Filled 2015-07-10: qty 1

## 2015-07-10 MED ORDER — SODIUM CHLORIDE 0.9% FLUSH
10.0000 mL | INTRAVENOUS | Status: DC | PRN
  Administered 2015-07-10: 20 mL
  Filled 2015-07-10: qty 40

## 2015-07-10 NOTE — Progress Notes (Signed)
CARDIAC REHAB PHASE I   PRE:  Rate/Rhythm: 90 SR  BP:  Sitting: 163/83        SaO2: 100 RA  MODE:  Ambulation: 360 ft   POST:  Rate/Rhythm: 104 ST  BP:  Sitting: 195/98, 164/78 after 2 minutes rest         SaO2: 100 RA  Pt ambulated 360 ft on RA, independent, brisk, steady gait, tolerated well. Pt c/o some DOE, denies any other complaints, declined rest stop. Cardiac surgery discharge education completed with pt and family at bedside. Reviewed IS, activity progression, exercise, heart healthy diet, sodium restrictions, daily weights and phase 2 cardiac rehab. Pt verbalized understanding. Pt agrees to phase 2 cardiac rehab referral, will send to [facility] per pt request. Pt to edge of bed per pt request after walk, call bell within reach, eager for discharge.   [id number] Lenna Sciara, RN, BSN 07/10/2015 11:52 AM

## 2015-07-10 NOTE — Discharge Summary (Signed)
Discharge Summary    Patient ID: Ryan Shea,  MRN: [id number], DOB/AGE: Jan 17, 1946 70 y.o.  Admit date: 07/08/2015 Discharge date: 07/10/2015  Primary Care Provider: Shade Flood Primary Cardiologist: Dr. Angelena Form  Discharge Diagnoses    Active Problems:   Severe aortic stenosis   Allergies Allergies  Allergen Reactions  . Gabapentin Other (See Comments)    Capsule causes bad heartburn    Diagnostic Studies/Procedures  Echo s/p TAVR 07/09/15 - Left ventricle: Wall thickness was increased in a pattern of  moderate LVH. Systolic function was normal. The estimated  ejection fraction was in the range of 55% to 60%. Doppler  parameters are consistent with abnormal left ventricular  relaxation (grade 1 diastolic dysfunction). - Aortic valve: Normal appearing 29 mm Sapien 3 valve with no  perivalvular regurgitation. Valve area (VTI): 1.47 cm^2. Valve  area (Vmax): 1.35 cm^2. Valve area (Vmean): 1.31 cm^2. - Mitral valve: There was mild regurgitation. Valve area by  continuity equation (using LVOT flow): 1.37 cm^2. - Left atrium: The atrium was mildly dilated. - Atrial septum: No defect or patent foramen ovale was identified. - Pulmonary arteries: PA peak pressure: 35 mm Hg (S).  _____________  TAVR 07/08/15 (operative report in Epic).     History of Present Illness   Ryan Shea is a 70 year old male with a past medical history of Aortic stenosis, CHF, hypertension, COPD, and atrial flutter on warfarin, hyperlipidemia, and recurrent GI bleed.   Hospital Course  CHF was first diagnosed with CHF in February 2015 at that time he had EF of 50-55% with moderate aortic stenosis with peak velocity across aortic valve reported at 1.80m/s corresponding to a mean transvalvular gradient estimated at 20 mmHg.  He was hospitalized for acute respiratory failure in December 2016. Respiratory failure felt to be secondary to COPD exacerbation.  He was hospitalized again in  January 2017, again with acute respiratory failure, at that time he ruled in for acute non-ST segment elevation myocardial infarction.Echo was done and revealed severe aortic stenosis with peak velocity across the aortic valve measured 4.1 m per sec, and a mean transvalvular gradient estimated at 80mmHg. CT surgery was consults would and felt he was too high risk for conventional surgical aortic valve replacement. ecision was then made to proceed with transcatheter aortic valve replacement.   He was admitted on 07/08/15 for planned transcatheter aortic valve replacement. He underwent successful transaortic valve replacement on 07/08/15, he was stable during his postop course.   His post TAVR echo revealed no paravalvular leak. He will resume Coumadin and aspirin discharge. Plavix was DC'd as patient is on warfarin.    He ambulated 360 feet on room air with steady gait, tolerated well. He did have some dyspnea on exertion, however was able to remain on room air.  Patient was seen by Dr.McAlhany and deemed suitable for discharge. _____________  Discharge Vitals Blood pressure 165/80, pulse 102, temperature 98.3 F (36.8 C), temperature source Oral, resp. rate 18, height 5\' 6"  (1.676 m), weight 245 lb 8 oz (111.358 kg), SpO2 97 %.  Filed Weights   07/08/15 0557 07/09/15 0500 07/10/15 0620  Weight: 245 lb (111.131 kg) 250 lb 10.6 oz (113.7 kg) 245 lb 8 oz (111.358 kg)    Labs & Radiologic Studies     CBC  Recent Labs  07/09/15 0255 07/10/15 0550  WBC 9.1 9.1  HGB 10.2* 10.8*  HCT 31.7* 34.0*  MCV 89.5 88.8  PLT 138* 144*  Basic Metabolic Panel  Recent Labs  07/08/15 1700 07/09/15 0255 07/10/15 0550  NA  --  140 142  K  --  4.2 3.7  CL  --  105 103  CO2  --  30 29  GLUCOSE  --  92 95  BUN  --  16 10  CREATININE 1.06 1.18 0.98  CALCIUM  --  8.4* 8.9  MG 1.8 1.8  --     Dg Chest 2 View  07/04/2015  CLINICAL DATA:  Preop for aortic valve replacement EXAM: CHEST  2 VIEW  COMPARISON:  04/17/2015 FINDINGS: Cardiomediastinal silhouette is stable. No acute infiltrate or pleural effusion. No pulmonary edema. Prominent fat pad in right cardiophrenic angle again noted. Stable degenerative changes thoracic spine. IMPRESSION: No active cardiopulmonary disease. Electronically Signed   By: Lahoma Crocker M.D.   On: 07/04/2015 12:26   Ct Coronary Morp W/cta Cor W/score W/ca W/cm &/or Wo/cm  06/11/2015  ADDENDUM REPORT: 06/11/2015 15:11 EXAM: OVER-READ INTERPRETATION  CT CHEST The following report is an over-read performed by radiologist Dr. Norlene Duel [facility] on 06/11/2015. This over-read does not include interpretation of cardiac or coronary anatomy or pathology. The coronary calcification interpretation by the cardiologist is attached. COMPARISON:  None FINDINGS: Mediastinum: The visualized lower airway appears patent. The visualized portions of the esophagus are normal. No mediastinal or hilar adenopathy identified. No axillary adenopathy. Lungs/Pleura: Moderate changes of centrilobular emphysema identified. There is no pleural fluid identified. No suspicious nodule or mass noted. Upper Abdomen: Calcified granulomas identified within the liver. Musculoskeletal: Degenerative disc disease is noted within the thoracic spine. IMPRESSION: 1. Emphysema. 2. Prior granulomatous disease. Electronically Signed   By: Kerby Moors M.D.   On: 06/11/2015 15:11  06/11/2015  CLINICAL DATA:  Aortic stenosis EXAM: Cardiac TAVR CT TECHNIQUE: The patient was scanned on a Philips 256 scanner. A 120 kV retrospective scan was triggered in the descending thoracic aorta at 111 HU's. Gantry rotation speed was 270 msecs and collimation was .9 mm. No beta blockade or nitro were given. The 3D data set was reconstructed in 5% intervals of the R-R cycle. Systolic and diastolic phases were analyzed on a dedicated work station using MPR, MIP and VRT modes. The patient received 80 cc of contrast.  FINDINGS: Aortic Valve: Trileaflet moderately calcified leaflets. Small area of calcification at the base of the right leaflet Aorta:  Mild calcification of the aortic arch No aneurysm or debris Sinotubular Junction:  32 mm Ascending Thoracic Aorta:  34 mm Aortic Arch:  28 mm Descending Thoracic Aorta:  23 mm Sinus of Valsalva Measurements: Non-coronary:  35 mm Right -coronary:  33 mm Left -coronary:  34 mm Coronary Artery Height above Annulus: Left Main:  14 mm Right Coronary:  18 mm Virtual Basal Annulus Measurements: Maximum/Minimum Diameter:  28 x 26 mm Perimeter:  89 mm Area:  618 mm2 Coronary Arteries:  Sufficient height above annulus for delivery Optimum Fluoroscopic Angle for Delivery: LAO 6 degrees Cranial 0 degrees IMPRESSION: 1) Calcified tri-leaflet aortic valve with annulus of 618 mm2 suitable for 29 mm Sapien 3 valve 2) Sufficient coronary height for delivery 3) No aortic root pathology or aneurysm 4) Optimum angiographic angle for delivery LAO 6 degrees Cranial 0 degrees 5) No LAA thrombus [place] EXAM: Cardiac CTA MEDICATIONS: Sub lingual nitro. 4mg  and lopressor mg TECHNIQUE: The patient was scanned on a Philips [id number] slice scanner. Gantry rotation speed was 270 msecs. Collimation was .39mm. A 100 kV prospective scan  was triggered in the descending thoracic aorta at 111 HU's with 5% padding centered around 78% of the R-R interval. Average HR during the scan was bpm. The 3D data set was interpreted on a dedicated work station using MPR, MIP and VRT modes. A total of 80cc of contrast was used. FINDINGS: Non-cardiac: See separate report from [facility]. No significant findings on limited lung and soft tissue windows. Calcium Score: Coronary Arteries: Right dominant with no anomalies LM: LAD: D1: D2: Circumflex: OM1: OM2: RCA: PDA: PLA: IMPRESSION: [place] Electronically Signed: By: Jenkins Rouge M.D. On: 06/11/2015 13:49   Dg Chest Port 1 View  07/09/2015  CLINICAL DATA:  Aortic  valve replacement. EXAM: PORTABLE CHEST 1 VIEW COMPARISON:  07/08/2015 . FINDINGS: Right IJ line in stable position. Prior cardiac valve repair. Stable cardiomegaly. No pulmonary venous congestion. Low lung volumes with mild basilar atelectasis. Small left pleural effusion. No pneumothorax. IMPRESSION: 1. Right IJ line in stable position. 2. Aortic valve replacement. Stable cardiomegaly. No pulmonary venous congestion. 3. Left lower lobe subsegmental atelectasis and small left pleural effusion cannot be excluded. Electronically Signed   By: Marcello Moores  Register   On: 07/09/2015 07:47   Dg Chest Port 1 View  07/08/2015  CLINICAL DATA:  Post transcatheter aortic valve replacement EXAM: PORTABLE CHEST 1 VIEW COMPARISON:  07/04/2015 FINDINGS: Cardiomegaly is noted. Right IJ central line with tip in upper SVC. Aortic valve transcatheter prosthesis. No infiltrate or pulmonary edema. Mild left basilar atelectasis. No pneumothorax. IMPRESSION: Right IJ central line in place. No pneumothorax. Cardiomegaly again noted. Aortic valve prosthesis. Electronically Signed   By: Lahoma Crocker M.D.   On: 07/08/2015 11:20   Ct Angio Chest Aorta W/cm &/or Wo/cm  06/16/2015  CLINICAL DATA:  70 year old male with history of severe aortic stenosis. Preprocedural study prior to potential transcatheter aortic valve replacement (TAVR). EXAM: CT ANGIOGRAPHY CHEST, ABDOMEN AND PELVIS TECHNIQUE: Multidetector CT imaging through the chest, abdomen and pelvis was performed using the standard protocol during bolus administration of intravenous contrast. Multiplanar reconstructed images and MIPs were obtained and reviewed to evaluate the vascular anatomy. CONTRAST:  70 mL of Isovue 370. COMPARISON:  None. FINDINGS: CTA CHEST FINDINGS Mediastinum/Lymph Nodes: Heart size is normal. There is no significant pericardial fluid, thickening or pericardial calcification. There is atherosclerosis of the thoracic aorta, the great vessels of the mediastinum and  the coronary arteries, including calcified atherosclerotic plaque in the left main and left anterior descending coronary arteries. Severe thickening calcification of the aortic valve. Mild calcifications of the mitral valve. No pathologically enlarged mediastinal or hilar lymph nodes. Esophagus is unremarkable in appearance. Heterogeneously enlarged thyroid gland with multiple nodules, the largest of which is in the anterior aspect of the right lobe of the gland measuring approximately 1.9 x 2.6 cm (image 16 of series 501). No axillary lymphadenopathy. Lungs/Pleura: In the medial aspect of the left lower lobe there are several clustered micronodules measuring up to 5 mm in size (image 97 of series 507), favored to be a related to areas of mucoid impaction within terminal bronchioles. No other more suspicious appearing pulmonary nodules or masses are noted. Mild diffuse bronchial wall thickening with mild centrilobular emphysema. No acute consolidative airspace disease. No pleural effusions. Musculoskeletal/Soft Tissues: There are no aggressive appearing lytic or blastic lesions noted in the visualized portions of the skeleton. CTA ABDOMEN AND PELVIS FINDINGS Hepatobiliary: Several small calcified granulomas are noted in the liver. No suspicious cystic or solid hepatic lesions. No intra or extrahepatic  biliary ductal dilatation. Gallbladder is normal in appearance. Pancreas: No pancreatic mass. No pancreatic ductal dilatation. No pancreatic or peripancreatic fluid or inflammatory changes. Spleen: Unremarkable. Adrenals/Urinary Tract: Bilateral adrenal glands are normal in appearance. Multifocal cortical thinning in the kidneys bilaterally, indicative of mild post infectious or inflammatory scarring. Exophytic 5.6 cm low-attenuation lesion in the interpolar region of the left kidney is compatible with a simple cyst. No suspicious renal lesions. No hydroureteronephrosis. Urinary bladder is normal in appearance.  Stomach/Bowel: The appearance of the stomach is normal. There is no pathologic dilatation of small bowel or colon. Numerous colonic diverticulae are noted, particularly in the sigmoid colon, without surrounding inflammatory changes to suggest an acute diverticulitis at this time. Normal appendix. Vascular/Lymphatic: Atherosclerosis throughout the abdominal and pelvic vasculature, without evidence of aneurysm or dissection. Vascular findings and measurements pertinent to potential TAVR procedure, as detailed below. Single renal arteries bilaterally. No lymphadenopathy noted in the abdomen or pelvis. 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 Other: No significant volume of ascites.  No pneumoperitoneum. Musculoskeletal: There are no aggressive appearing lytic or blastic lesions noted in the visualized portions of the skeleton. VASCULAR MEASUREMENTS PERTINENT TO TAVR: AORTA: Minimal Aortic Diameter -  11 x 13 mm Severity of Aortic Calcification -  moderate RIGHT PELVIS: Right Common Iliac Artery - Minimal Diameter - 9.4 x 10.1 mm Tortuosity - mild Calcification - mild Right External Iliac Artery - Minimal Diameter - 8.6 x 6.3 mm Tortuosity - mild Calcification - none Right Common Femoral Artery - Minimal Diameter - 8.6 x 7.1 mm Tortuosity - mild Calcification - mild LEFT PELVIS: Left Common Iliac Artery - Minimal Diameter - 9.6 x 8.2 mm Tortuosity - mild Calcification - mild Left External Iliac Artery - Minimal Diameter - 9.5 x 9.1 mm Tortuosity - mild Calcification - none Left Common Femoral Artery - Minimal Diameter - 8.9 x 7.3 mm Tortuosity - mild Calcification - mild Review of the MIP images confirms the above findings. IMPRESSION: 1. Vascular findings and measurements pertinent to potential TAVR procedure, as detailed above. This patient appears to have suitable pelvic arterial access bilaterally. 2. Severe thickening calcification of the aortic valve, compatible with the reported clinical  history of severe aortic stenosis. 3. Cluster of small nodules in the left lower lobe measuring 5 mm or less in size. No follow-up needed if patient is low-risk (and has no known or suspected primary neoplasm). Non-contrast chest CT can be considered in 12 months if patient is high-risk. This recommendation follows the consensus statement: Guidelines for Management of Incidental Pulmonary Nodules Detected on CT Images:From the Fleischner Society 2017; published online before print (10.1148/radiol.IJ:2314499). 4. Mild diffuse bronchial wall thickening with mild centrilobular emphysema; imaging findings suggestive of underlying COPD. 5. Colonic diverticulosis without evidence of acute diverticulitis at this time. 6. Additional incidental findings, as above. Electronically Signed   By: Vinnie Langton M.D.   On: 06/16/2015 11:03   Ct Angio Abd/pel W/ And/or W/o  06/16/2015  CLINICAL DATA:  70 year old male with history of severe aortic stenosis. Preprocedural study prior to potential transcatheter aortic valve replacement (TAVR). EXAM: CT ANGIOGRAPHY CHEST, ABDOMEN AND PELVIS TECHNIQUE: Multidetector CT imaging through the chest, abdomen and pelvis was performed using the standard protocol during bolus administration of intravenous contrast. Multiplanar reconstructed images and MIPs were obtained and reviewed to evaluate the vascular anatomy. CONTRAST:  70 mL of Isovue 370. COMPARISON:  None. FINDINGS: CTA CHEST FINDINGS Mediastinum/Lymph Nodes: Heart size is normal. There is no significant pericardial  fluid, thickening or pericardial calcification. There is atherosclerosis of the thoracic aorta, the great vessels of the mediastinum and the coronary arteries, including calcified atherosclerotic plaque in the left main and left anterior descending coronary arteries. Severe thickening calcification of the aortic valve. Mild calcifications of the mitral valve. No pathologically enlarged mediastinal or hilar lymph  nodes. Esophagus is unremarkable in appearance. Heterogeneously enlarged thyroid gland with multiple nodules, the largest of which is in the anterior aspect of the right lobe of the gland measuring approximately 1.9 x 2.6 cm (image 16 of series 501). No axillary lymphadenopathy. Lungs/Pleura: In the medial aspect of the left lower lobe there are several clustered micronodules measuring up to 5 mm in size (image 97 of series 507), favored to be a related to areas of mucoid impaction within terminal bronchioles. No other more suspicious appearing pulmonary nodules or masses are noted. Mild diffuse bronchial wall thickening with mild centrilobular emphysema. No acute consolidative airspace disease. No pleural effusions. Musculoskeletal/Soft Tissues: There are no aggressive appearing lytic or blastic lesions noted in the visualized portions of the skeleton. CTA ABDOMEN AND PELVIS FINDINGS Hepatobiliary: Several small calcified granulomas are noted in the liver. No suspicious cystic or solid hepatic lesions. No intra or extrahepatic biliary ductal dilatation. Gallbladder is normal in appearance. Pancreas: No pancreatic mass. No pancreatic ductal dilatation. No pancreatic or peripancreatic fluid or inflammatory changes. Spleen: Unremarkable. Adrenals/Urinary Tract: Bilateral adrenal glands are normal in appearance. Multifocal cortical thinning in the kidneys bilaterally, indicative of mild post infectious or inflammatory scarring. Exophytic 5.6 cm low-attenuation lesion in the interpolar region of the left kidney is compatible with a simple cyst. No suspicious renal lesions. No hydroureteronephrosis. Urinary bladder is normal in appearance. Stomach/Bowel: The appearance of the stomach is normal. There is no pathologic dilatation of small bowel or colon. Numerous colonic diverticulae are noted, particularly in the sigmoid colon, without surrounding inflammatory changes to suggest an acute diverticulitis at this time.  Normal appendix. Vascular/Lymphatic: Atherosclerosis throughout the abdominal and pelvic vasculature, without evidence of aneurysm or dissection. Vascular findings and measurements pertinent to potential TAVR procedure, as detailed below. Single renal arteries bilaterally. No lymphadenopathy noted in the abdomen or pelvis. 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 Other: No significant volume of ascites.  No pneumoperitoneum. Musculoskeletal: There are no aggressive appearing lytic or blastic lesions noted in the visualized portions of the skeleton. VASCULAR MEASUREMENTS PERTINENT TO TAVR: AORTA: Minimal Aortic Diameter -  11 x 13 mm Severity of Aortic Calcification -  moderate RIGHT PELVIS: Right Common Iliac Artery - Minimal Diameter - 9.4 x 10.1 mm Tortuosity - mild Calcification - mild Right External Iliac Artery - Minimal Diameter - 8.6 x 6.3 mm Tortuosity - mild Calcification - none Right Common Femoral Artery - Minimal Diameter - 8.6 x 7.1 mm Tortuosity - mild Calcification - mild LEFT PELVIS: Left Common Iliac Artery - Minimal Diameter - 9.6 x 8.2 mm Tortuosity - mild Calcification - mild Left External Iliac Artery - Minimal Diameter - 9.5 x 9.1 mm Tortuosity - mild Calcification - none Left Common Femoral Artery - Minimal Diameter - 8.9 x 7.3 mm Tortuosity - mild Calcification - mild Review of the MIP images confirms the above findings. IMPRESSION: 1. Vascular findings and measurements pertinent to potential TAVR procedure, as detailed above. This patient appears to have suitable pelvic arterial access bilaterally. 2. Severe thickening calcification of the aortic valve, compatible with the reported clinical history of severe aortic stenosis. 3. Cluster of small nodules in  the left lower lobe measuring 5 mm or less in size. No follow-up needed if patient is low-risk (and has no known or suspected primary neoplasm). Non-contrast chest CT can be considered in 12 months if patient is  high-risk. This recommendation follows the consensus statement: Guidelines for Management of Incidental Pulmonary Nodules Detected on CT Images:From the Fleischner Society 2017; published online before print (10.1148/radiol.SG:5268862). 4. Mild diffuse bronchial wall thickening with mild centrilobular emphysema; imaging findings suggestive of underlying COPD. 5. Colonic diverticulosis without evidence of acute diverticulitis at this time. 6. Additional incidental findings, as above. Electronically Signed   By: Vinnie Langton M.D.   On: 06/16/2015 11:03    Disposition   Pt is being discharged home today in good condition.  Follow-up Plans & Appointments  Follow up with Jory Sims on 5/15. He will follow up with Dr. Angelena Form in one month, and have Echo done at that time.      Discharge Medications   Current Discharge Medication List    CONTINUE these medications which have NOT CHANGED   Details  albuterol (PROVENTIL) (2.5 MG/3ML) 0.083% nebulizer solution Take 3 mLs (2.5 mg total) by nebulization every 4 (four) hours as needed for wheezing or shortness of breath. Qty: 75 mL, Refills: 12    allopurinol (ZYLOPRIM) 100 MG tablet Take 100 mg by mouth daily.     aspirin 81 MG chewable tablet Chew 1 tablet (81 mg total) by mouth daily. Qty: 30 tablet, Refills: 0    carvedilol (COREG) 6.25 MG tablet Take 1 tablet (6.25 mg total) by mouth 2 (two) times daily with a meal. Qty: 180 tablet, Refills: 3    colchicine 0.6 MG tablet Take 1 tablet (0.6 mg total) by mouth daily. Qty: 20 tablet, Refills: 0    ferrous sulfate 325 (65 FE) MG tablet Take 325 mg by mouth at bedtime.     Fluticasone-Salmeterol (ADVAIR DISKUS) 250-50 MCG/DOSE AEPB Inhale 1 puff into the lungs daily. Qty: 60 each, Refills: 1    furosemide (LASIX) 40 MG tablet Take 1 tablet (40 mg total) by mouth daily as needed for fluid or edema. Qty: 30 tablet, Refills: 0    gabapentin (NEURONTIN) 600 MG tablet Take 300 mg by  mouth 3 (three) times daily as needed (for pain/neuropathy).     Iron Polysacch Cmplx-[id number] 150-0.025-1 MG CAPS Take 1 capsule by mouth 2 (two) times daily. Qty: 60 each, Refills: 0    pantoprazole (PROTONIX) 40 MG tablet Take 1 tablet (40 mg total) by mouth daily. Qty: 60 tablet, Refills: 5    pravastatin (PRAVACHOL) 40 MG tablet Take 1 tablet by mouth at bedtime.     VENTOLIN HFA 108 (90 BASE) MCG/ACT inhaler Inhale 1 puff into the lungs every 6 (six) hours as needed for wheezing or shortness of breath. Reported on 07/02/2015    warfarin (COUMADIN) 1 MG tablet Take 3.5 tablets (3.5 mg total) by mouth daily at 6 PM. Qty: 60 tablet, Refills: 0    zolpidem (AMBIEN) 10 MG tablet Take 0.5 tablets (5 mg total) by mouth at bedtime as needed for sleep. Qty: 30 tablet, Refills: 0           Outstanding Labs/Studies    Duration of Discharge Encounter   Greater than 30 minutes including physician time.  Signed, Arbutus Leas NP 07/10/2015, 11:07 AM

## 2015-07-10 NOTE — Progress Notes (Signed)
2 Days Post-Op Procedure(s) (LRB): TRANSCATHETER AORTIC VALVE REPLACEMENT, TRANSFEMORAL (N/A) TRANSESOPHAGEAL ECHOCARDIOGRAM (TEE) (N/A) Subjective:  No complaints, ready to go home.  Objective: Vital signs in last 24 hours: Temp:  [98.3 F (36.8 C)-98.5 F (36.9 C)] 98.3 F (36.8 C) (05/04 0557) Pulse Rate:  [88-105] 102 (05/04 0811) Cardiac Rhythm:  [-] Normal sinus rhythm (05/03 2030) Resp:  [16-20] 18 (05/04 0557) BP: (120-165)/(51-80) 165/80 mmHg (05/04 0811) SpO2:  [95 %-99 %] 99 % (05/04 0557) Weight:  [111.358 kg (245 lb 8 oz)] 111.358 kg (245 lb 8 oz) (05/04 0620)  Hemodynamic parameters for last 24 hours:    Intake/Output from previous day: 05/03 0701 - 05/04 0700 In: 785 [P.O.:460; IV Piggyback:50] Out: 2850 [Urine:2850] Intake/Output this shift:    General appearance: alert and cooperative Neurologic: intact Heart: regular rate and rhythm, S1, S2 normal, no murmur, click, rub or gallop Lungs: clear to auscultation bilaterally Extremities: extremities normal, atraumatic, no cyanosis or edema Wound: groin sites ok  Lab Results:  Recent Labs  07/09/15 0255 07/10/15 0550  WBC 9.1 9.1  HGB 10.2* 10.8*  HCT 31.7* 34.0*  PLT 138* 144*   BMET:  Recent Labs  07/09/15 0255 07/10/15 0550  NA 140 142  K 4.2 3.7  CL 105 103  CO2 30 29  GLUCOSE 92 95  BUN 16 10  CREATININE 1.18 0.98  CALCIUM 8.4* 8.9    PT/INR:  Recent Labs  07/09/15 0255  LABPROT 15.1  INR 1.18   ABG    Component Value Date/Time   PHART 7.246* 07/08/2015 1126   HCO3 27.3* 07/08/2015 1126   TCO2 29 07/08/2015 1658   ACIDBASEDEF 1.0 07/08/2015 1126   O2SAT 95.0 07/08/2015 1126   CBG (last 3)   Recent Labs  07/09/15 0356 07/09/15 0928 07/09/15 [facility]*  [address], [place] [address]   [phone]  ------------------------------------------------------------------- Transthoracic Echocardiography  Patient: Ryan Shea, Padmanabhan MR #: [id number] Study Date: 07/09/2015 Gender: M Age: 70 Height: 167.6 cm Weight: 113.7 kg BSA: 2.36 m^2 Pt. Status: Room: 2S03C  SONOGRAPHER Diamond Nickel ADMITTING Lauree Chandler ATTENDING Sonny Dandy, Christopher REFERRING McAlhany, Christopher PERFORMING [facility], Inpatient  cc:  ------------------------------------------------------------------- LV EF: 55% - 60%  ------------------------------------------------------------------- Indications: Aortic stenosis 424.1.  ------------------------------------------------------------------- History: PMH: Asthma. Cancer. Cardiomyopathy. Status post TAVR. Atrial flutter. Chronic obstructive pulmonary disease. Risk factors: Hypertension.  ------------------------------------------------------------------- Study Conclusions  - Left ventricle: Wall thickness was increased in a pattern of  moderate LVH. Systolic function was normal. The estimated  ejection fraction was in the range of 55% to 60%. Doppler  parameters are consistent with abnormal left ventricular  relaxation (grade 1 diastolic dysfunction). - Aortic valve: Normal appearing 29 mm Sapien 3 valve with no  perivalvular regurgitation. Valve area (VTI): 1.47 cm^2. Valve  area (Vmax): 1.35 cm^2. Valve area (Vmean): 1.31 cm^2. - Mitral valve: There was mild regurgitation. Valve area by  continuity equation (using LVOT flow): 1.37 cm^2. - Left atrium: The atrium was mildly dilated. - Atrial septum: No defect or patent foramen ovale was identified. - Pulmonary arteries: PA peak pressure: 35 mm Hg (S).  Transthoracic echocardiography. M-mode, complete 2D, spectral Doppler, and color Doppler. Birthdate:  Patient birthdate: 1945-03-19. Age: Patient is 70 yr old. Sex: Gender: male. BMI: 40.5 kg/m^2. Blood pressure: 113/59 Patient status: Inpatient. Study date: Study date: 07/09/2015. Study time: 11:24 AM. Location: ICU/CCU  -------------------------------------------------------------------  -------------------------------------------------------------------  Left ventricle: Wall thickness was increased in a pattern of moderate LVH. Systolic function was normal. The estimated ejection fraction was in the range of 55% to 60%. Doppler parameters are consistent with abnormal left ventricular relaxation (grade 1 diastolic dysfunction).  ------------------------------------------------------------------- Aortic valve: Normal appearing 29 mm Sapien 3 valve with no perivalvular regurgitation. Doppler: VTI ratio of LVOT to aortic valve: 0.42. Valve area (VTI): 1.47 cm^2. Indexed valve area (VTI): 0.62 cm^2/m^2. Peak velocity ratio of LVOT to aortic valve: 0.39. Valve area (Vmax): 1.35 cm^2. Indexed valve area (Vmax): 0.57 cm^2/m^2. Mean velocity ratio of LVOT to aortic valve: 0.38. Valve area (Vmean): 1.31 cm^2. Indexed valve area (Vmean): 0.56 cm^2/m^2.  Mean gradient (S): 7 mm Hg. Peak gradient (S): 14 mm Hg.  ------------------------------------------------------------------- Mitral valve: Mildly thickened leaflets . Doppler: There was mild regurgitation. Valve area by continuity equation (using LVOT flow): 1.37 cm^2. Indexed valve area by continuity equation (using LVOT flow): 0.58 cm^2/m^2. Mean gradient (D): 8 mm Hg. Peak gradient (D): 7 mm Hg.  ------------------------------------------------------------------- Left atrium: The atrium was mildly dilated.  ------------------------------------------------------------------- Atrial septum: No defect or patent foramen ovale was  identified.  ------------------------------------------------------------------- Right ventricle: The cavity size was normal. Wall thickness was normal. Systolic function was normal.  ------------------------------------------------------------------- Tricuspid valve: Doppler: There was mild regurgitation.  ------------------------------------------------------------------- Right atrium: The atrium was normal in size.  ------------------------------------------------------------------- Pericardium: The pericardium was normal in appearance.  ------------------------------------------------------------------- Systemic veins: Inferior vena cava: The vessel was normal in size. The respirophasic diameter changes were in the normal range (>= 50%), consistent with normal central venous pressure.  ------------------------------------------------------------------- Measurements  Left ventricle Value Reference LV ID, ED, PLAX chordal (L) 39.7 mm 43 - 52 LV ID, ES, PLAX chordal 29.1 mm 23 - 38 LV fx shortening, PLAX chordal (L) 27 % >=29 LV PW thickness, ED 15 mm --------- IVS/LV PW ratio, ED 0.96 <=1.3 Stroke volume, 2D 53 ml --------- Stroke volume/bsa, 2D 22 ml/m^2 ---------  Ventricular septum Value Reference IVS thickness, ED 14.4 mm ---------  LVOT Value Reference LVOT ID, S 21 mm --------- LVOT area 3.46 cm^2 --------- LVOT peak velocity, S 71.8 cm/s --------- LVOT mean velocity, S 45.9 cm/s  --------- LVOT VTI, S 15.3 cm --------- LVOT peak gradient, S 2 mm Hg ---------  Aortic valve Value Reference Aortic valve peak velocity, S 184 cm/s --------- Aortic valve mean velocity, S 121 cm/s --------- Aortic valve VTI, S 36.1 cm --------- Aortic mean gradient, S 7 mm Hg --------- Aortic peak gradient, S 14 mm Hg --------- VTI ratio, LVOT/AV 0.42 --------- Aortic valve area, VTI 1.47 cm^2 --------- Aortic valve area/bsa, VTI 0.62 cm^2/m^2 --------- Velocity ratio, peak, LVOT/AV 0.39 --------- Aortic valve area, peak velocity 1.35 cm^2 --------- Aortic valve area/bsa, peak 0.57 cm^2/m^2 --------- velocity Velocity ratio, mean, LVOT/AV 0.38 --------- Aortic valve area, mean velocity 1.31 cm^2 --------- Aortic valve area/bsa, mean 0.56 cm^2/m^2 --------- velocity  Left atrium Value Reference LA ID, A-P, ES 39 mm --------- LA ID/bsa, A-P 1.65 cm/m^2 <=2.2 LA volume, S 113 ml --------- LA volume/bsa, S 48 ml/m^2 --------- LA volume, ES, 1-p A4C 105 ml --------- LA volume/bsa, ES, 1-p A4C 44.6 ml/m^2 --------- LA volume, ES, 1-p A2C 110 ml --------- LA volume/bsa, ES, 1-p A2C 46.7 ml/m^2 ---------  Mitral valve Value Reference Mitral E-wave peak velocity  136 cm/s --------- Mitral A-wave peak velocity 192 cm/s --------- Mitral mean velocity, D 127 cm/s --------- Mitral deceleration time 225 ms 150 - 230 Mitral mean gradient, D 8 mm  Hg --------- Mitral peak gradient, D 7 mm Hg --------- Mitral E/A ratio, peak 0.8 --------- Mitral valve area, LVOT 1.37 cm^2 --------- continuity Mitral valve area/bsa, LVOT 0.58 cm^2/m^2 --------- continuity Mitral annulus VTI, D 38.6 cm ---------  Pulmonary arteries Value Reference PA pressure, S, DP (H) 35 mm Hg <=30  Tricuspid valve Value Reference Tricuspid regurg peak velocity 262 cm/s --------- Tricuspid peak RV-RA gradient 27 mm Hg ---------  Systemic veins Value Reference Estimated CVP 8 mm Hg ---------  Right ventricle Value Reference RV pressure, S, DP (H) 35 mm Hg <=30  Legend: (L) and (H) mark values outside specified reference range.  ------------------------------------------------------------------- Prepared and Electronically Authenticated by  Jenkins Rouge, M.D. 2017-05-03T12:36:13  Assessment/Plan: S/P Procedure(s) (LRB): TRANSCATHETER AORTIC VALVE REPLACEMENT, TRANSFEMORAL (N/A) TRANSESOPHAGEAL ECHOCARDIOGRAM (TEE) (N/A) POD 2   He has been hemodynamically stable in sinus rhythm. Echo looked fine. I do not see any paravalvular leak and gradient low as expected. Plan home today with follow up as arranged by Dr. Angelena Form.   LOS: 2 days    Gaye Pollack 07/10/2015

## 2015-07-10 NOTE — Progress Notes (Signed)
     SUBJECTIVE: He has no complaints this am. No chest pain or dyspnea.   BP 149/59 mmHg  Pulse 96  Temp(Src) 98.3 F (36.8 C) (Oral)  Resp 18  Ht 5\' 6"  (1.676 m)  Wt 245 lb 8 oz (111.358 kg)  BMI 39.64 kg/m2  SpO2 99%  Intake/Output Summary (Last 24 hours) at 07/10/15 0725 Last data filed at 07/10/15 0548  Gross per 24 hour  Intake    785 ml  Output   2850 ml  Net  -2065 ml    PHYSICAL EXAM General: Well developed, well nourished, in no acute distress. Alert and oriented x 3.  Psych:  Good affect, responds appropriately Neck: No JVD. No masses noted.  Lungs: Clear bilaterally with no wheezes or rhonci noted.  Heart: RRR with no murmurs noted. Abdomen: Bowel sounds are present. Soft, non-tender.  Extremities: No lower extremity edema.    LABS: Basic Metabolic Panel:  Recent Labs  07/08/15 1700 07/09/15 0255 07/10/15 0550  NA  --  140 142  K  --  4.2 3.7  CL  --  105 103  CO2  --  30 29  GLUCOSE  --  92 95  BUN  --  16 10  CREATININE 1.06 1.18 0.98  CALCIUM  --  8.4* 8.9  MG 1.8 1.8  --    CBC:  Recent Labs  07/09/15 0255 07/10/15 0550  WBC 9.1 9.1  HGB 10.2* 10.8*  HCT 31.7* 34.0*  MCV 89.5 88.8  PLT 138* 144*   Current Meds: . allopurinol  100 mg Oral Daily  . aspirin  81 mg Oral Daily  . carvedilol  6.25 mg Oral BID WC  . clopidogrel  75 mg Oral Daily  . colchicine  0.6 mg Oral Daily  . docusate sodium  200 mg Oral Daily  . ferrous [id number] C-folic acid  1 capsule Oral BID WC  . ferrous sulfate  325 mg Oral QHS  . fluticasone furoate-vilanterol  1 puff Inhalation Daily  . pantoprazole  40 mg Oral QAC breakfast  . pravastatin  40 mg Oral QHS  . sodium chloride flush  3 mL Intravenous Q12H   Echo 07/09/15: Left ventricle: Wall thickness was increased in a pattern of  moderate LVH. Systolic function was normal. The estimated  ejection fraction was in the range of 55% to 60%. Doppler  parameters are consistent with abnormal  left ventricular  relaxation (grade 1 diastolic dysfunction). - Aortic valve: Normal appearing 29 mm Sapien 3 valve with no  perivalvular regurgitation. Valve area (VTI): 1.47 cm^2. Valve  area (Vmax): 1.35 cm^2. Valve area (Vmean): 1.31 cm^2. - Mitral valve: There was mild regurgitation. Valve area by  continuity equation (using LVOT flow): 1.37 cm^2. - Left atrium: The atrium was mildly dilated. - Atrial septum: No defect or patent foramen ovale was identified. - Pulmonary arteries: PA peak pressure: 35 mm Hg (S).   ASSESSMENT AND PLAN:   1. Severe aortic valve stenosis: He is POD # 2 s/p TAVR. He is stable this am. He is on ASA and Plavix. Echo with normally functioning AVR. Will d/c home today. He will need one week follow up in our [place] office with Jory Sims, NP and one month f/u with me with echo.       Savon Cobbs  5/4/20177:25 AM

## 2015-07-11 MED FILL — Insulin Regular (Human) Inj 100 Unit/ML: INTRAMUSCULAR | Qty: 250 | Status: AC

## 2015-07-12 LAB — TYPE AND SCREEN
ABO/RH(D): O POS
Antibody Screen: NEGATIVE
UNIT DIVISION: 0
UNIT DIVISION: 0

## 2015-07-14 LAB — PROTIME-INR: INR: 1.2 — AB (ref 0.9–1.1)

## 2015-07-17 LAB — PROTIME-INR
INR: 1.1 (ref 0.9–1.1)
INR: 1.2 — AB (ref 0.9–1.1)
INR: 1.7 — AB (ref 0.9–1.1)

## 2015-07-21 ENCOUNTER — Encounter: Payer: Self-pay | Admitting: Adult Health

## 2015-07-21 ENCOUNTER — Ambulatory Visit (INDEPENDENT_AMBULATORY_CARE_PROVIDER_SITE_OTHER): Payer: Medicare PPO | Admitting: Adult Health

## 2015-07-21 VITALS — BP 144/76 | HR 97 | Ht 66.0 in | Wt 241.0 lb

## 2015-07-21 DIAGNOSIS — E7800 Pure hypercholesterolemia, unspecified: Secondary | ICD-10-CM | POA: Diagnosis not present

## 2015-07-21 DIAGNOSIS — I350 Nonrheumatic aortic (valve) stenosis: Secondary | ICD-10-CM | POA: Diagnosis not present

## 2015-07-21 DIAGNOSIS — I10 Essential (primary) hypertension: Secondary | ICD-10-CM

## 2015-07-21 MED ORDER — CARVEDILOL 6.25 MG PO TABS: 9.3750 mg | ORAL_TABLET | Freq: Two times a day (BID) | ORAL | Status: AC

## 2015-07-21 NOTE — Progress Notes (Signed)
Cardiology Office Note   Date:  07/21/2015   ID:  Ryan Shea, [name] 1945-12-03, MRN [id number]  PCP:  Shade Flood, MD  Cardiologist: Mc[facility]/  Jory Sims, NP   Chief Complaint  Patient presents with  . Cardiac Valve Problem    S/P TAVR      History of Present Illness: Ryan Shea is a 70 y.o. male who presents for ongoing assessment and management of severe aortic valve stenosis, status post TAVR, CHF, hypertension, COPD, atrial flutter on Coumadin therapy, hyperlipidemia, and recurrent GI bleed.he underwent cardiothoracic surgery on 07/08/2015,by Dr.Bartle. His post TAVR echo revealed no paravalvular leak. He  resumed Coumadin and aspirin discharge. Plavix was DC'd as patient is on warfarin. He continued to have some mild dyspnea on exertion.  He comes today "feeling like a new man" with increased energy, no dyspnea, and no complaints of pain. He is medically compliant. Heart rate is elevated however with mildly blood pressure. He is very grateful to Dr. Merlene Pulling and Dr. Caffie Pinto. Coumadin is being followed by his primary care physician in [place].  Past Medical History  Diagnosis Date  . Asthma   . Hypertension   . COPD (chronic obstructive pulmonary disease) ([place])   . Cancer [facility])     Prostate   . Helicobacter pylori gastritis MAR 2016  . Multiple duodenal ulcers MAR 2016  . Atrial flutter ([place])   . Aortic valve stenosis   . Cardiomyopathy ([place])   . Mitral regurgitation   . Gout     Past Surgical History  Procedure Laterality Date  . Prostate surgery    . Hernia repair    . Esophagogastroduodenoscopy N/A 05/31/2014    H PYLORI GASTRITIS, DUODENAL ULCERS  . Colonoscopy    . Colonoscopy N/A 08/25/2014    Rehman:examination performed to cecum. moderate number of diverticula at sigmoid colon without stigmata of bleed small external hemorrhoids and anal papillae  . Cardiac catheterization N/A 03/26/2015    Procedure: Right/Left Heart Cath and Coronary  Angiography;  Surgeon: Burnell Blanks, MD;  Location: [facility];  Service: Cardiovascular;  Laterality: N/A;  . Givens capsule study N/A 04/18/2015    Procedure: GIVENS CAPSULE STUDY;  Surgeon: Danie Binder, MD;  Location: [facility];  Service: Endoscopy;  Laterality: N/A;  . Transcatheter aortic valve replacement, transfemoral N/A 07/08/2015    Procedure: TRANSCATHETER AORTIC VALVE REPLACEMENT, TRANSFEMORAL;  Surgeon: Burnell Blanks, MD;  Location: [facility];  Service: Open Heart Surgery;  Laterality: N/A;  . Tee without cardioversion N/A 07/08/2015    Procedure: TRANSESOPHAGEAL ECHOCARDIOGRAM (TEE);  Surgeon: Burnell Blanks, MD;  Location: [facility];  Service: Open Heart Surgery;  Laterality: N/A;     Current Outpatient Prescriptions  Medication Sig Dispense Refill  . albuterol (PROVENTIL) (2.5 MG/3ML) 0.083% nebulizer solution Take 3 mLs (2.5 mg total) by nebulization every 4 (four) hours as needed for wheezing or shortness of breath. 75 mL 12  . allopurinol (ZYLOPRIM) 100 MG tablet Take 100 mg by mouth daily.     Marland Kitchen aspirin 81 MG chewable tablet Chew 1 tablet (81 mg total) by mouth daily. 30 tablet 0  . colchicine 0.6 MG tablet Take 1 tablet (0.6 mg total) by mouth daily. 20 tablet 0  . ferrous sulfate 325 (65 FE) MG tablet Take 325 mg by mouth at bedtime.     . Fluticasone-Salmeterol (ADVAIR DISKUS) 250-50 MCG/DOSE AEPB Inhale 1 puff into the lungs daily. 60 each 1  .  gabapentin (NEURONTIN) 600 MG tablet Take 300 mg by mouth 3 (three) times daily as needed (for pain/neuropathy).     . Iron Polysacch Cmplx-B12-FA 150-0.025-1 MG CAPS Take 1 capsule by mouth 2 (two) times daily. 60 each 0  . pantoprazole (PROTONIX) 40 MG tablet Take 1 tablet (40 mg total) by mouth daily. 60 tablet 5  . pravastatin (PRAVACHOL) 40 MG tablet Take 1 tablet by mouth at bedtime.     . VENTOLIN HFA 108 (90 BASE) MCG/ACT inhaler Inhale 1 puff into the lungs every 6 (six) hours as needed for  wheezing or shortness of breath. Reported on 07/02/2015    . warfarin (COUMADIN) 1 MG tablet Take 3.5 tablets (3.5 mg total) by mouth daily at 6 PM. 60 tablet 0  . zolpidem (AMBIEN) 10 MG tablet Take 0.5 tablets (5 mg total) by mouth at bedtime as needed for sleep. 30 tablet 0  . carvedilol (COREG) 6.25 MG tablet Take 1.5 tablets (9.375 mg total) by mouth 2 (two) times daily. 180 tablet 3   No current facility-administered medications for this visit.    Allergies:   Gabapentin    Social History:  The patient  reports that he quit smoking about 36 years ago. His smoking use included Cigarettes. He started smoking about 55 years ago. He has a 40 pack-year smoking history. He has quit using smokeless tobacco. He reports that he uses illicit drugs (Methaqualone). He reports that he does not drink alcohol.   Family History:  The patient's family history includes Cancer in his brother and other; Stroke in his mother. There is no history of Colon cancer or Colon polyps.    ROS: All other systems are reviewed and negative. Unless otherwise mentioned in H&P    PHYSICAL EXAM: VS:  BP 144/76 mmHg  Pulse 97  Ht 5\' 6"  (1.676 m)  Wt 241 lb (109.317 kg)  BMI 38.92 kg/m2  SpO2 94% , BMI Body mass index is 38.92 kg/(m^2). GEN: Well nourished, well developed, in no acute distress HEENT: normal Neck: no JVD, carotid bruits, or masses Cardiac: RRR; diastolic murmur, no murmurs, rubs, or gallops,no edema  Respiratory:  Clear to auscultation bilaterally, normal work of breathing GI: soft, nontender, nondistended, + BS MS: no deformity or atrophy Skin: warm and dry, no rash Neuro:  Strength and sensation are intact Psych: euthymic mood, full affect  Recent Labs: 04/30/2015: TSH 0.156* 07/04/2015: ALT 13* 07/09/2015: Magnesium 1.8 07/10/2015: BUN 10; Creatinine, Ser 0.98; Hemoglobin 10.8*; Platelets 144*; Potassium 3.7; Sodium 142    Lipid Panel    Component Value Date/Time   CHOL 166 03/26/2015 0416    TRIG 71 03/26/2015 0416   HDL 46 03/26/2015 0416   CHOLHDL 3.6 03/26/2015 0416   VLDL 14 03/26/2015 0416   LDLCALC 106* 03/26/2015 0416      Wt Readings from Last 3 Encounters:  07/21/15 241 lb (109.317 kg)  07/10/15 245 lb 8 oz (111.358 kg)  07/04/15 245 lb 6.4 oz (111.313 kg)     ASSESSMENT AND PLAN:  1.  Severe aortic valve stenosis:he is status post TAVR by Dr. Dema Severin and Dr. Angelena Form. Heart rate is elevated today with mild elevation in blood pressure. I will increase his carvedilol from 6.25 mg twice a day to 9.375 mg twice a day. The patient will continue followup by surgery and primary care.  2. Paroxysmal atrial flutter:he will continue Coumadin therapy, and be followed by primary care for ongoing dosing and management.  3. Hypertension:blood pressure  is mildly elevated. With elevated heart rate carvedilol has been increased as discussed.  4. Hypercholesterolemia:he'll continue statin therapy with labs for primary care.   Current medicines are reviewed at length with the patient today.    Labs/ tests ordered today include:  No orders of the defined types were placed in this encounter.     Disposition:   FU with 3 months unless symptomatic.  Signed, Jory Sims, NP  07/21/2015 4:23 PM    [facility] [address] [place], [place] [address] Phone: [phone]; Fax: [phone]

## 2015-07-21 NOTE — Patient Instructions (Signed)
Your physician recommends that you schedule a follow-up appointment in: 3 Months   Your physician has recommended you make the following change in your medication:   Increase Coreg to 9.375 mg Two times Daily  If you need a refill on your cardiac medications before your next appointment, please call your pharmacy.  Thank you for choosing [facility]!

## 2015-07-21 NOTE — Progress Notes (Deleted)
Name: Ryan Shea    DOB: 1946/02/17  Age: 70 y.o.  MR#: [id number]       PCP:  Shade Flood, MD      Insurance: Payor: [email or web address] MEDICARE / Plan: [employer] MEDICARE CHOICE PPO / Product Type: *No Product type* /   CC:   No chief complaint on file.   VS Filed Vitals:   07/21/15 1454  BP: 144/76  Pulse: 97  Height: 5' 6"  (1.676 m)  Weight: 241 lb (109.317 kg)  SpO2: 94%    Weights Current Weight  07/21/15 241 lb (109.317 kg)  07/10/15 245 lb 8 oz (111.358 kg)  07/04/15 245 lb 6.4 oz (111.313 kg)    Blood Pressure  BP Readings from Last 3 Encounters:  07/21/15 144/76  07/10/15 165/80  07/04/15 134/56     Admit date:  (Not on file) Last encounter with RMR:  05/12/2015   Allergy Gabapentin  Current Outpatient Prescriptions  Medication Sig Dispense Refill  . albuterol (PROVENTIL) (2.5 MG/3ML) 0.083% nebulizer solution Take 3 mLs (2.5 mg total) by nebulization every 4 (four) hours as needed for wheezing or shortness of breath. 75 mL 12  . allopurinol (ZYLOPRIM) 100 MG tablet Take 100 mg by mouth daily.     Marland Kitchen aspirin 81 MG chewable tablet Chew 1 tablet (81 mg total) by mouth daily. 30 tablet 0  . carvedilol (COREG) 6.25 MG tablet Take 1 tablet (6.25 mg total) by mouth 2 (two) times daily with a meal. 180 tablet 3  . colchicine 0.6 MG tablet Take 1 tablet (0.6 mg total) by mouth daily. 20 tablet 0  . ferrous sulfate 325 (65 FE) MG tablet Take 325 mg by mouth at bedtime.     . Fluticasone-Salmeterol (ADVAIR DISKUS) 250-50 MCG/DOSE AEPB Inhale 1 puff into the lungs daily. 60 each 1  . gabapentin (NEURONTIN) 600 MG tablet Take 300 mg by mouth 3 (three) times daily as needed (for pain/neuropathy).     . Iron Polysacch Cmplx-B12-FA 150-0.025-1 MG CAPS Take 1 capsule by mouth 2 (two) times daily. 60 each 0  . pantoprazole (PROTONIX) 40 MG tablet Take 1 tablet (40 mg total) by mouth daily. 60 tablet 5  . pravastatin (PRAVACHOL) 40 MG tablet Take 1 tablet by mouth at bedtime.     .  VENTOLIN HFA 108 (90 BASE) MCG/ACT inhaler Inhale 1 puff into the lungs every 6 (six) hours as needed for wheezing or shortness of breath. Reported on 07/02/2015    . warfarin (COUMADIN) 1 MG tablet Take 3.5 tablets (3.5 mg total) by mouth daily at 6 PM. 60 tablet 0  . zolpidem (AMBIEN) 10 MG tablet Take 0.5 tablets (5 mg total) by mouth at bedtime as needed for sleep. 30 tablet 0   No current facility-administered medications for this visit.    Discontinued Meds:   There are no discontinued medications.  Patient Active Problem List   Diagnosis Date Noted  . Severe aortic stenosis 07/08/2015  . Blood in stool   . NSTEMI (non-ST elevated myocardial infarction) ([place]) 03/22/2015  . CKD (chronic kidney disease) stage 3, GFR 30-59 ml/min 03/22/2015  . Morbid obesity ([place]) 03/22/2015  . Elevated troponin   . Acute renal failure ([place]) 08/24/2014  . Chronic anticoagulation 08/24/2014  . GI bleed 08/23/2014  . Anemia 08/23/2014  . Rectal bleeding 08/23/2014  . Colon cancer screening 07/11/2014  . Multiple duodenal ulcers 05/30/2014  . Aortic stenosis 04/23/2013  . Mitral regurgitation 04/23/2013  . Cardiomyopathy ([place])  04/23/2013  . Paroxysmal atrial flutter ([place]) 04/23/2013  . HTN (hypertension) 04/20/2013  . High cholesterol 04/20/2013  . COPD (chronic obstructive pulmonary disease) ([place]) 04/20/2013  . Prostate cancer ([place]) 04/20/2013  . Backache, unspecified 04/20/2013  . Body mass index 40.0-44.9, adult (HCC) 04/20/2013    LABS    Component Value Date/Time   NA 142 07/10/2015 0550   NA 140 07/09/2015 0255   NA 142 07/08/2015 1658   K 3.7 07/10/2015 0550   K 4.2 07/09/2015 0255   K 4.9 07/08/2015 1658   CL 103 07/10/2015 0550   CL 105 07/09/2015 0255   CL 104 07/08/2015 1658   CO2 29 07/10/2015 0550   CO2 30 07/09/2015 0255   CO2 20* 07/04/2015 1122   GLUCOSE 95 07/10/2015 0550   GLUCOSE 92 07/09/2015 0255   GLUCOSE 112* 07/08/2015 1658   BUN 10 07/10/2015 0550   BUN  16 07/09/2015 0255   BUN 16 07/08/2015 1658   CREATININE 0.98 07/10/2015 0550   CREATININE 1.18 07/09/2015 0255   CREATININE 1.06 07/08/2015 1700   CREATININE 1.07 06/09/2015 1426   CALCIUM 8.9 07/10/2015 0550   CALCIUM 8.4* 07/09/2015 0255   CALCIUM 9.1 07/04/2015 1122   GFRNONAA >60 07/10/2015 0550   GFRNONAA >60 07/09/2015 0255   GFRNONAA >60 07/08/2015 1700   GFRNONAA 70 06/09/2015 1426   GFRAA >60 07/10/2015 0550   GFRAA >60 07/09/2015 0255   GFRAA >60 07/08/2015 1700   GFRAA 81 06/09/2015 1426   CMP     Component Value Date/Time   NA 142 07/10/2015 0550   K 3.7 07/10/2015 0550   CL 103 07/10/2015 0550   CO2 29 07/10/2015 0550   GLUCOSE 95 07/10/2015 0550   BUN 10 07/10/2015 0550   CREATININE 0.98 07/10/2015 0550   CREATININE 1.07 06/09/2015 1426   CALCIUM 8.9 07/10/2015 0550   PROT 6.5 07/04/2015 1122   ALBUMIN 3.4* 07/04/2015 1122   AST 17 07/04/2015 1122   ALT 13* 07/04/2015 1122   ALKPHOS 64 07/04/2015 1122   BILITOT 0.3 07/04/2015 1122   GFRNONAA >60 07/10/2015 0550   GFRNONAA 70 06/09/2015 1426   GFRAA >60 07/10/2015 0550   GFRAA 81 06/09/2015 1426       Component Value Date/Time   WBC 9.1 07/10/2015 0550   WBC 9.1 07/09/2015 0255   WBC 12.2* 07/08/2015 1700   HGB 10.8* 07/10/2015 0550   HGB 10.2* 07/09/2015 0255   HGB 10.8* 07/08/2015 1700   HCT 34.0* 07/10/2015 0550   HCT 31.7* 07/09/2015 0255   HCT 33.7* 07/08/2015 1700   MCV 88.8 07/10/2015 0550   MCV 89.5 07/09/2015 0255   MCV 88.0 07/08/2015 1700    Lipid Panel     Component Value Date/Time   CHOL 166 03/26/2015 0416   TRIG 71 03/26/2015 0416   HDL 46 03/26/2015 0416   CHOLHDL 3.6 03/26/2015 0416   VLDL 14 03/26/2015 0416   LDLCALC 106* 03/26/2015 0416    ABG    Component Value Date/Time   PHART 7.246* 07/08/2015 1126   PCO2ART 62.6* 07/08/2015 1126   PO2ART 86.0 07/08/2015 1126   HCO3 27.3* 07/08/2015 1126   TCO2 29 07/08/2015 1658   ACIDBASEDEF 1.0 07/08/2015 1126   O2SAT  95.0 07/08/2015 1126     Lab Results  Component Value Date   TSH 0.156* 04/30/2015   BNP (last 3 results) No results for input(s): BNP in the last 8760 hours.  ProBNP (last 3 results) No results  for input(s): PROBNP in the last 8760 hours.  Cardiac Panel (last 3 results) No results for input(s): CKTOTAL, CKMB, TROPONINI, RELINDX in the last 72 hours.  Iron/TIBC/Ferritin/ %Sat    Component Value Date/Time   IRON 21* 04/30/2015 1300   TIBC 326 04/30/2015 1300   FERRITIN 49 04/30/2015 1300   IRONPCTSAT 6* 04/30/2015 1300     EKG Orders placed or performed during the hospital encounter of 07/08/15  . EKG 12-Lead  . EKG 12-Lead  . EKG 12-Lead  . EKG 12-Lead  . EKG 12-Lead  . EKG 12-Lead     Prior Assessment and Plan Problem List as of 07/21/2015      Cardiovascular and Mediastinum   HTN (hypertension)   Aortic stenosis   Mitral regurgitation   Cardiomyopathy (HCC)   Paroxysmal atrial flutter (HCC)   NSTEMI (non-ST elevated myocardial infarction) ([place])   Severe aortic stenosis     Respiratory   COPD (chronic obstructive pulmonary disease) ([place])     Digestive   Multiple duodenal ulcers   Last Assessment & Plan 07/11/2014 Office Visit Edited 07/11/2014  9:51 AM by Danie Binder, MD    UGIB due to INR > 6 and duodenal ulcers/H PYLORI GASTRITIS. NO BRBPR OR MELENA. LAST Hb 9.11 Jun 2014.  TREAT H PYLORI ABP BID FOR 10 DAYS. MED SIDE EFFECTS DISCUSSED. NEEDS CLOSE MONITORING OF COUMADIN WHILE ON ABX. REDUCE COUMADIN. CHECK INR 3 DAYS AFTER STARTING ABX. DISCUSSED WITH PHARMACY. PROTONIX BID FOR 3 MOS THEN ONCE DAILY FOR 3 MOS THEN WILL CONSIDER STOPPING MEDS. FOLLOW UP IN 4 MOS.  READDRESS NEED FOR PROTONIX. NEEDS CBC AFTER NEXT VISIT.       GI bleed   Rectal bleeding     Genitourinary   Prostate cancer (HCC)   Acute renal failure (HCC)   CKD (chronic kidney disease) stage 3, GFR 30-59 ml/min     Other   High cholesterol   Backache, unspecified   Body mass  index 40.0-44.9, adult [facility])   Colon cancer screening   Last Assessment & Plan 07/11/2014 Office Visit Written 07/11/2014  9:44 AM by Danie Binder, MD    PT REPORTS TCS IN PAST 10 YRS-AVERAGE RISK.  WILL OBTAIN REPORT.      Anemia   Last Assessment & Plan 04/30/2015 Office Visit Edited 04/30/2015  1:15 PM by Baird Cancer, PA-C    Normocytic, normochromic anemia with preservation of WBC and platelet count with history of GI blood loss in the setting of vitamin K antagonist anticoagulation and antiplatelet therapy with ASA for atrial fibrillation requiring PRBC transfusions x 6; S/P complete GI work-up including EGD by Dr. Oneida Alar on 05/31/2014 showing 2 duodenal ulcers, mild erosive gastritis, and H. Pylori, colonoscopy by Dr. Laural Golden on 08/25/2014 showing a diverticular bleed and hemorrhoidal bleed, and a pill cam on 04/19/2015 by Dr. Oneida Alar revealing occasional small bowel erosions.  Another factor that may be playing a part in this patient's anemia is mechanical damage to RBCs from severe aortic stenosis that is followed by cardiology.  Labs today: CBC differential, LDH, ESR, CRP, iron/TIBC, ferritin, haptoglobin, reticulocyte count, erythropoietin level, SPEP plus IFE, light chain assay, occult stool cards 3, free/total testosterone, TSH, PT/INR, and peripheral smear review by pathologist.  He is to discontinue his PO iron, in light of upcoming stool card testing.  Iron deficit calculation using the goal of a hemoglobin of 13 g/dL results in a deficit of approximately 1000 mg of iron.  As a  result, we will get him set-up for 1 dose of Injectafer.  Given his past EtOHism, I will order an [place] of liver to evaluate for cirrhosis of liver, as this can participate in anemia as well.  Return in 2-3 weeks for follow-up.      Chronic anticoagulation   Morbid obesity (HCC)   Elevated troponin   Blood in stool       Imaging: Dg Chest 2 View  07/04/2015  CLINICAL DATA:  Preop for aortic valve  replacement EXAM: CHEST  2 VIEW COMPARISON:  04/17/2015 FINDINGS: Cardiomediastinal silhouette is stable. No acute infiltrate or pleural effusion. No pulmonary edema. Prominent fat pad in right cardiophrenic angle again noted. Stable degenerative changes thoracic spine. IMPRESSION: No active cardiopulmonary disease. Electronically Signed   By: Lahoma Crocker M.D.   On: 07/04/2015 12:26   Dg Chest Port 1 View  07/09/2015  CLINICAL DATA:  Aortic valve replacement. EXAM: PORTABLE CHEST 1 VIEW COMPARISON:  07/08/2015 . FINDINGS: Right IJ line in stable position. Prior cardiac valve repair. Stable cardiomegaly. No pulmonary venous congestion. Low lung volumes with mild basilar atelectasis. Small left pleural effusion. No pneumothorax. IMPRESSION: 1. Right IJ line in stable position. 2. Aortic valve replacement. Stable cardiomegaly. No pulmonary venous congestion. 3. Left lower lobe subsegmental atelectasis and small left pleural effusion cannot be excluded. Electronically Signed   By: Marcello Moores  Register   On: 07/09/2015 07:47   Dg Chest Port 1 View  07/08/2015  CLINICAL DATA:  Post transcatheter aortic valve replacement EXAM: PORTABLE CHEST 1 VIEW COMPARISON:  07/04/2015 FINDINGS: Cardiomegaly is noted. Right IJ central line with tip in upper SVC. Aortic valve transcatheter prosthesis. No infiltrate or pulmonary edema. Mild left basilar atelectasis. No pneumothorax. IMPRESSION: Right IJ central line in place. No pneumothorax. Cardiomegaly again noted. Aortic valve prosthesis. Electronically Signed   By: Lahoma Crocker M.D.   On: 07/08/2015 11:20

## 2015-07-22 LAB — PROTIME-INR
INR: 3.3 — AB (ref 0.9–1.1)
INR: 3.3 — AB (ref 0.9–1.1)

## 2015-08-08 ENCOUNTER — Encounter (HOSPITAL_COMMUNITY): Payer: Medicare PPO | Attending: Oncology | Admitting: Hematology & Oncology

## 2015-08-08 ENCOUNTER — Encounter (HOSPITAL_COMMUNITY): Payer: Medicare PPO

## 2015-08-08 VITALS — BP 115/65 | HR 77 | Temp 97.8°F | Resp 18 | Wt 240.8 lb

## 2015-08-08 DIAGNOSIS — K625 Hemorrhage of anus and rectum: Secondary | ICD-10-CM | POA: Diagnosis not present

## 2015-08-08 DIAGNOSIS — Z79899 Other long term (current) drug therapy: Secondary | ICD-10-CM | POA: Diagnosis not present

## 2015-08-08 DIAGNOSIS — M109 Gout, unspecified: Secondary | ICD-10-CM | POA: Insufficient documentation

## 2015-08-08 DIAGNOSIS — K922 Gastrointestinal hemorrhage, unspecified: Secondary | ICD-10-CM

## 2015-08-08 DIAGNOSIS — J449 Chronic obstructive pulmonary disease, unspecified: Secondary | ICD-10-CM | POA: Diagnosis not present

## 2015-08-08 DIAGNOSIS — N183 Chronic kidney disease, stage 3 (moderate): Secondary | ICD-10-CM | POA: Diagnosis not present

## 2015-08-08 DIAGNOSIS — Z9889 Other specified postprocedural states: Secondary | ICD-10-CM | POA: Diagnosis not present

## 2015-08-08 DIAGNOSIS — Z87891 Personal history of nicotine dependence: Secondary | ICD-10-CM | POA: Diagnosis not present

## 2015-08-08 DIAGNOSIS — D500 Iron deficiency anemia secondary to blood loss (chronic): Secondary | ICD-10-CM

## 2015-08-08 DIAGNOSIS — I429 Cardiomyopathy, unspecified: Secondary | ICD-10-CM | POA: Diagnosis not present

## 2015-08-08 DIAGNOSIS — I350 Nonrheumatic aortic (valve) stenosis: Secondary | ICD-10-CM | POA: Insufficient documentation

## 2015-08-08 DIAGNOSIS — J45909 Unspecified asthma, uncomplicated: Secondary | ICD-10-CM | POA: Insufficient documentation

## 2015-08-08 DIAGNOSIS — D649 Anemia, unspecified: Secondary | ICD-10-CM | POA: Insufficient documentation

## 2015-08-08 DIAGNOSIS — E7800 Pure hypercholesterolemia, unspecified: Secondary | ICD-10-CM | POA: Insufficient documentation

## 2015-08-08 DIAGNOSIS — I480 Paroxysmal atrial fibrillation: Secondary | ICD-10-CM | POA: Insufficient documentation

## 2015-08-08 DIAGNOSIS — Z8546 Personal history of malignant neoplasm of prostate: Secondary | ICD-10-CM | POA: Diagnosis not present

## 2015-08-08 DIAGNOSIS — I129 Hypertensive chronic kidney disease with stage 1 through stage 4 chronic kidney disease, or unspecified chronic kidney disease: Secondary | ICD-10-CM | POA: Diagnosis not present

## 2015-08-08 DIAGNOSIS — I4891 Unspecified atrial fibrillation: Secondary | ICD-10-CM

## 2015-08-08 DIAGNOSIS — Z7982 Long term (current) use of aspirin: Secondary | ICD-10-CM | POA: Diagnosis not present

## 2015-08-08 DIAGNOSIS — Z7901 Long term (current) use of anticoagulants: Secondary | ICD-10-CM | POA: Insufficient documentation

## 2015-08-08 LAB — CBC WITH DIFFERENTIAL/PLATELET
BASOS ABS: 0 10*3/uL (ref 0.0–0.1)
BASOS PCT: 0 %
EOS ABS: 0.1 10*3/uL (ref 0.0–0.7)
Eosinophils Relative: 1 %
HCT: 38.8 % — ABNORMAL LOW (ref 39.0–52.0)
HEMOGLOBIN: 12.6 g/dL — AB (ref 13.0–17.0)
Lymphocytes Relative: 20 %
Lymphs Abs: 1.9 10*3/uL (ref 0.7–4.0)
MCH: 28.8 pg (ref 26.0–34.0)
MCHC: 32.5 g/dL (ref 30.0–36.0)
MCV: 88.6 fL (ref 78.0–100.0)
Monocytes Absolute: 0.7 10*3/uL (ref 0.1–1.0)
Monocytes Relative: 7 %
NEUTROS ABS: 6.9 10*3/uL (ref 1.7–7.7)
NEUTROS PCT: 72 %
Platelets: 214 10*3/uL (ref 150–400)
RBC: 4.38 MIL/uL (ref 4.22–5.81)
RDW: 14.3 % (ref 11.5–15.5)
WBC: 9.6 10*3/uL (ref 4.0–10.5)

## 2015-08-08 LAB — FERRITIN: FERRITIN: 62 ng/mL (ref 24–336)

## 2015-08-08 NOTE — Progress Notes (Signed)
[facility] [place] NOTE   Name: Ryan Shea      MRN: [id number]  Date: 08/12/2015 Time:5:07 PM   REFERRING PHYSICIAN:  Barney Drain, MD (GI)  REASON FOR CONSULT: Normocytic, transfusion dependent anemia   DIAGNOSIS:  Normocytic, normochromic anemia with preservation of WBC and platelet count on vitamin K antagonist.  HISTORY OF PRESENT ILLNESS:   Ryan Shea is a 70 year old white American with a past medical history significant for rectal bleeding, paroxysmal atrial fibrillation on chronic vitamin K antagonists anticoagulation, severe aortic stenosis, NSTEMI, multiple duodenal ulcers, morbid obesity, hypertension, high cholesterol, COPD, H/O chronic kidney disease stage III, and cardiomyopathy who is referred to the [facility] for evaluation of normocytic anemia requiring transfusions. He status post an anemia panel in 04/18/2015 shows a low-normal vitamin B12 level at 298, folate at 21.1, TIBC of 337, low iron saturation of 5%, low serum iron at 17, and a ferritin of 34. Reticulocyte count show a reticulocytosis at 13.3%.  His anemia dates back to at least March 2016 at which point time he had a hemoglobin of 4.3.  He's been admitted to the hospital number occasions for acute GI blood loss. He is status post upper endoscopy by Barney Drain, MD (GI) on 05/31/2014 demonstrating 2 duodenal ulcers in the setting of anticoagulation in addition to mild nonerosive gastritis. Stomach biopsies demonstrate H. pylori gastritis.  It appears as though the patient's received 6 units of packed red blood cells over the past 12 months.  He received 4 units of blood on his first hospitalization in March 2016 and then the patient reports that he received 2 units with his most recent hospitalization. He also knows that he received 2 units of fresh frozen plasma.  He is S/P a camera capsule study on 04/18/2014 by Dr. Oneida Alar as well.  This demonstrated occasional  small bowel erosions due to ASA use without any active bleeding.  On 08/25/2014, the patient underwent a colonoscopy by Dr. Laural Golden showing hemorrhoidal/diverticular bleed.  He received one dose of IV injectafer on 2/28. He underwent cardiothoracic surgery on 07/08/2015,by Dr.Bartle. His post TAVR echo revealed no paravalvular leak. He resumed Coumadin and aspirin discharge. Plavix was DC'd as patient is on warfarin.   Ryan Shea returns to the [facility] today accompanied by his wife.  He remarks that he's feeling great now. He confirms that he did not have to receive blood during his valve replacement surgery, and comments that he "feels like a new man." He says that his breathing is a whole lot better, and he's been able to be more active, walking often. He hopes he can get up to about a mile of walking a day, and is very excited about continuing to improve and build his heart back up.  He notes that his appetite is "better than it should be" since he plans on losing weight. He keeps repeating that he feels like he's a new man, "compared to how he felt when he went in there." He confirms that he could tell the difference right away.  During the physical exam, Ryan Shea denies any blood in his stool. He also denies any black, tarry, or sticky stool.   He confirms that he takes ferrous sulfate twice a day. He continues on Coumadin therapy as well.  He thinks everything sounds great today, in terms of plans and next steps. He's happy to receive IV iron  if needed, and amenable to getting his blood work drawn on the way out today.   PAST MEDICAL HISTORY:   Past Medical History  Diagnosis Date  . Asthma   . Hypertension   . COPD (chronic obstructive pulmonary disease) ([place])   . Cancer [facility])     Prostate   . Helicobacter pylori gastritis MAR 2016  . Multiple duodenal ulcers MAR 2016  . Atrial flutter ([place])   . Aortic valve stenosis   . Cardiomyopathy ([place])   . Mitral regurgitation   . Gout      ALLERGIES: Allergies  Allergen Reactions  . Gabapentin Other (See Comments)    Capsule causes bad heartburn      MEDICATIONS: I have reviewed the patient's current medications.    Current Outpatient Prescriptions on File Prior to Visit  Medication Sig Dispense Refill  . albuterol (PROVENTIL) (2.5 MG/3ML) 0.083% nebulizer solution Take 3 mLs (2.5 mg total) by nebulization every 4 (four) hours as needed for wheezing or shortness of breath. 75 mL 12  . allopurinol (ZYLOPRIM) 100 MG tablet Take 100 mg by mouth daily.     Marland Kitchen aspirin 81 MG chewable tablet Chew 1 tablet (81 mg total) by mouth daily. 30 tablet 0  . carvedilol (COREG) 6.25 MG tablet Take 1.5 tablets (9.375 mg total) by mouth 2 (two) times daily. 180 tablet 3  . colchicine 0.6 MG tablet Take 1 tablet (0.6 mg total) by mouth daily. 20 tablet 0  . Fluticasone-Salmeterol (ADVAIR DISKUS) 250-50 MCG/DOSE AEPB Inhale 1 puff into the lungs daily. 60 each 1  . gabapentin (NEURONTIN) 600 MG tablet Take 300 mg by mouth 3 (three) times daily as needed (for pain/neuropathy).     . Iron Polysacch Cmplx-B12-FA 150-0.025-1 MG CAPS Take 1 capsule by mouth 2 (two) times daily. 60 each 0  . pantoprazole (PROTONIX) 40 MG tablet Take 1 tablet (40 mg total) by mouth daily. 60 tablet 5  . pravastatin (PRAVACHOL) 40 MG tablet Take 1 tablet by mouth at bedtime.     . VENTOLIN HFA 108 (90 BASE) MCG/ACT inhaler Inhale 1 puff into the lungs every 6 (six) hours as needed for wheezing or shortness of breath. Reported on 07/02/2015    . warfarin (COUMADIN) 1 MG tablet Take 3.5 tablets (3.5 mg total) by mouth daily at 6 PM. 60 tablet 0  . zolpidem (AMBIEN) 10 MG tablet Take 0.5 tablets (5 mg total) by mouth at bedtime as needed for sleep. 30 tablet 0   No current facility-administered medications on file prior to visit.     PAST SURGICAL HISTORY Past Surgical History  Procedure Laterality Date  . Prostate surgery    . Hernia repair    .  Esophagogastroduodenoscopy N/A 05/31/2014    H PYLORI GASTRITIS, DUODENAL ULCERS  . Colonoscopy    . Colonoscopy N/A 08/25/2014    Rehman:examination performed to cecum. moderate number of diverticula at sigmoid colon without stigmata of bleed small external hemorrhoids and anal papillae  . Cardiac catheterization N/A 03/26/2015    Procedure: Right/Left Heart Cath and Coronary Angiography;  Surgeon: Burnell Blanks, MD;  Location: [facility];  Service: Cardiovascular;  Laterality: N/A;  . Givens capsule study N/A 04/18/2015    Procedure: GIVENS CAPSULE STUDY;  Surgeon: Danie Binder, MD;  [facility];  Service: Endoscopy;  Laterality: N/A;  . Transcatheter aortic valve replacement, transfemoral N/A 07/08/2015    Procedure: TRANSCATHETER AORTIC VALVE REPLACEMENT, TRANSFEMORAL;  Surgeon: Annita Brod  Angelena Form, MD;  Location: [facility];  Service: Open Heart Surgery;  Laterality: N/A;  . Tee without cardioversion N/A 07/08/2015    Procedure: TRANSESOPHAGEAL ECHOCARDIOGRAM (TEE);  Surgeon: Burnell Blanks, MD;  Location: [facility];  Service: Open Heart Surgery;  Laterality: N/A;    FAMILY HISTORY: Family History  Problem Relation Age of Onset  . Stroke Mother   . Cancer Brother   . Cancer Other   . Colon cancer Neg Hx   . Colon polyps Neg Hx   Mother is deceased at the age of 76 from recurrent strokes Father deceased in his 1s from heart disease His one brother who is deceased from prostate cancer. He passed away in his 76s. He has 2 brothers who are still living, [name] who has diabetes and a history of prostate cancer and Eddie Dibbles who also has diabetes. His 2 sisters who are living, Joycelyn Schmid and [place]. Joycelyn Schmid has Alzheimer's. He has 3 children, 2 daughters and 1 son. Daughters ages are 83 and 37. Both are healthy. His son is 104 years old and healthy. His 8 grandchildren and 5 great-grandchildren. All healthy.  SOCIAL HISTORY:  reports that he quit smoking about 36  years ago. His smoking use included Cigarettes. He started smoking about 55 years ago. He has a 40 pack-year smoking history. He has quit using smokeless tobacco. He reports that he uses illicit drugs (Methaqualone). He reports that he does not drink alcohol.  Pubis to the history of drinking alcohol quitting about 8 years ago taking up to a half a case per day. He's been married for 50 years. He does have a previous divorce. 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   14 point review of systems was performed and is negative except as detailed under history of present illness and above  PERFORMANCE STATUS: The patient's performance status is 1 - Symptomatic but completely ambulatory   PHYSICAL EXAM: Most Recent Vital Signs: Blood pressure 115/65, pulse 77, temperature 97.8 F (36.6 C), temperature source Oral, resp. rate 18, weight 240 lb 12.8 oz (109.226 kg), SpO2 97 %. General appearance: alert, cooperative, appears stated age, no distress, morbidly obese and Accompanied by his wife. Wears glasses. Head: Normocephalic, without obvious abnormality, atraumatic Eyes: negative findings: lids and lashes normal, conjunctivae and sclerae normal and corneas clear Throat: normal findings: lips normal without lesions, buccal mucosa normal, tongue midline and normal and oropharynx pink & moist without lesions or evidence of thrush and abnormal findings: dentition: upper and lower dentures Neck: no adenopathy, supple, symmetrical, trachea midline, thyroid not enlarged, symmetric, no tenderness/mass/nodules and Thick neck Lungs: Coarse breath sounds. Heart: regular rate and rhythm, S1, S2 normal and systolic murmur: systolic ejection 2/6, blowing at 2nd right intercostal space Abdomen: normal findings: bowel sounds normal, liver span normal to percussion, soft, non-tender and spleen non-palpable and abnormal findings:  obese Extremities: no edema, redness or tenderness in the  calves or thighs Skin: Skin color, texture, turgor normal. No rashes or lesions. Burn on scalp consistent with dermatology removal. Lymph nodes: Cervical, supraclavicular, and axillary nodes normal. Neurologic: Alert and oriented X 3, normal strength and tone. Normal symmetric reflexes. Normal coordination and gait  LABORATORY DATA:  I have reviewed the data as listed. CBC    Component Value Date/Time   WBC 9.6 08/08/2015 1028   RBC 4.38 08/08/2015 1028   RBC 3.17* 04/30/2015 1300   HGB 12.6* 08/08/2015 1028   HCT 38.8* 08/08/2015 1028  PLT 214 08/08/2015 1028   MCV 88.6 08/08/2015 1028   MCH 28.8 08/08/2015 1028   MCHC 32.5 08/08/2015 1028   RDW 14.3 08/08/2015 1028   LYMPHSABS 1.9 08/08/2015 1028   MONOABS 0.7 08/08/2015 1028   EOSABS 0.1 08/08/2015 1028   BASOSABS 0.0 08/08/2015 1028      Chemistry      Component Value Date/Time   NA 142 07/10/2015 0550   K 3.7 07/10/2015 0550   CL 103 07/10/2015 0550   CO2 29 07/10/2015 0550   BUN 10 07/10/2015 0550   CREATININE 0.98 07/10/2015 0550   CREATININE 1.07 06/09/2015 1426      Component Value Date/Time   CALCIUM 8.9 07/10/2015 0550   ALKPHOS 64 07/04/2015 1122   AST 17 07/04/2015 1122   ALT 13* 07/04/2015 1122   BILITOT 0.3 07/04/2015 1122     Lab Results  Component Value Date   IRON 21* 04/30/2015   TIBC 326 04/30/2015   FERRITIN 62 08/08/2015   Lab Results  Component Value Date   VITAMINB12 298 04/18/2015   Lab Results  Component Value Date   FOLATE 21.1 04/18/2015      Results for JABRIL, PURSELL (MRN [id number])   Ref. Range 04/30/2015 13:00  Total Protein ELP Latest Ref Range: 6.0-8.5 g/dL 5.9 (L)  Albumin ELP Latest Ref Range: 2.9-4.4 g/dL 2.7 (L)  Globulin, Total Latest Ref Range: 2.2-3.9 g/dL 3.2  A/G Ratio Latest Ref Range: 0.7-1.7  0.8  Alpha-1-Globulin Latest Ref Range: 0.0-0.4 g/dL 0.3  Alpha-2-Globulin Latest Ref Range: 0.4-1.0 g/dL 1.0  Beta Globulin Latest Ref Range: 0.7-1.3 g/dL 1.1    Gamma Globulin Latest Ref Range: 0.4-1.8 g/dL 0.8  M-SPIKE, % Latest Ref Range: Not Observed g/dL Not Observed  SPE Interp. Unknown Comment  Comment Unknown Comment  IgG (Immunoglobin G), Serum Latest Ref Range: [id number] mg/dL 913  IgA Latest Ref Range: 61-437 mg/dL 217  IgM, Serum Latest Ref Range: 20-172 mg/dL 84  Kappa free light chain Latest Ref Range: 3.30-19.40 mg/L 55.01 (H)  Lamda free light chains Latest Ref Range: 5.71-26.30 mg/L 47.72 (H)  Kappa, lamda light chain ratio Latest Ref Range: 0.26-1.65  1.15   Results for JONTA, GASTINEAU (MRN [id number])   Ref. Range 04/30/2015 13:00  Haptoglobin Latest Ref Range: 34-200 mg/dL 357 (H)  Erythropoietin Latest Ref Range: 2.6-18.5 mIU/mL 34.2 (H)  Sed Rate Latest Ref Range: 0-16 mm/hr 128 (H)  Prothrombin Time Latest Ref Range: 11.6-15.2 seconds 14.7  INR Latest Ref Range: 0.00-1.49  1.13  Testosterone Latest Ref Range: [phone] ng/dL 177 (L)  Testosterone Free Latest Ref Range: 6.6-18.1 pg/mL 3.2 (L)  Testosterone-% Free Latest Ref Range: 0.2-0.7 % 1.0 (L)  TSH Latest Ref Range: 0.350-4.500 uIU/mL 0.156 (L)     RADIOGRAPHY: No results found.     PATHOLOGY:    Stomach, biopsy - HELICOBACTER PYLORI GASTRITIS. - NO EVIDENCE OF INTESTINAL METAPLASIA, DYSPLASIA, OR MALIGNANCY. - SEE COMMENT. Microscopic Comment A Warthin-Starry stain is performed to determine the possibility of the presence of Helicobacter pylori. Organisms of Helicobacter pylori are identified on the Warthin-Starry stain. (HCL:ds 06/04/14) Aldona Bar MD Pathologist, Electronic Signature (Case signed 06/04/2014)  ASSESSMENT/PLAN:  Anemia Chronic anticoagulation Transfusion requirement GI bleed Thorough GI evaluation including pill cam with duodenal ulcers and diverticular bleed H. Pylori ESR at 128 Elevated inflammatory markers Iron deficiency with hemoccult positive stool Hypogonadism  We will continue to monitor his blood counts and ferritin  levels given his chronic anticoagulation and  history of GI bleeding. We will advise the patient of ongoing iron replacement as needed.   Labs will be repeated in 6 weeks. Standing labs will be placed in the future most likely at 6 week intervals.   He will return in 4 months.  Orders Placed This Encounter  Procedures  . CBC with Differential  . Ferritin  . CBC with Differential    Standing Status: Future     Number of Occurrences:      Standing Expiration Date: 08/07/2016  . Ferritin    Standing Status: Future     Number of Occurrences:      Standing Expiration Date: 08/07/2016  . CBC with Differential    Standing Status: Future     Number of Occurrences: 1     Standing Expiration Date: 08/11/2015  . Ferritin    Standing Status: Future     Number of Occurrences: 1     Standing Expiration Date: 08/11/2015   All questions were answered. The patient knows to call the clinic with any problems, questions or concerns. We can certainly see the patient much sooner if necessary.  This document serves as a record of services personally performed by Ancil Linsey, MD. It was created on her behalf by Toni Amend, a trained medical scribe. The creation of this record is based on the scribe's personal observations and the provider's statements to them. This document has been checked and approved by the attending provider.  I have reviewed the above documentation for accuracy and completeness, and I agree with the above.  This note is electronically signed by:  Molli Hazard, MD   08/12/2015 5:07 PM

## 2015-08-08 NOTE — Patient Instructions (Signed)
[place] at [facility] Discharge Instructions  RECOMMENDATIONS MADE BY THE CONSULTANT AND ANY TEST RESULTS WILL BE SENT TO YOUR REFERRING PHYSICIAN.  Return in 4 months to clinic  Labs today - go to lab    Thank you for choosing [place] at [facility] to provide your oncology and hematology care.  To afford each patient quality time with our provider, please arrive at least 15 minutes before your scheduled appointment time.   Beginning January 23rd 2017 lab work for the [facility] will be done in the  Main lab at [facility] on 1st floor. If you have a lab appointment with the [facility] please come in thru the  Main Entrance and check in at the main information desk  You need to re-schedule your appointment should you arrive 10 or more minutes late.  We strive to give you quality time with our providers, and arriving late affects you and other patients whose appointments are after yours.  Also, if you no show three or more times for appointments you may be dismissed from the clinic at the providers discretion.     Again, thank you for choosing [facility].  Our hope is that these requests will decrease the amount of time that you wait before being seen by our physicians.       _____________________________________________________________  Should you have questions after your visit to [facility], please contact our office at [phone] between the hours of 8:30 a.m. and 4:30 p.m.  Voicemails left after 4:30 p.m. will not be returned until the following business day.  For prescription refill requests, have your pharmacy contact our office.         Resources For Cancer Patients and their Caregivers ? American Cancer Society: Can assist with transportation, wigs, general needs, runs Look Good Feel Better.        3063142748 ? Cancer Care: Provides financial assistance, online support groups,  medication/co-pay assistance.  1-800-813-HOPE (248)324-6741) ? [facility] Assists [place] cancer patients and their families through emotional , educational and financial support.  (501)278-3896 ? [place] DSS Where to apply for food stamps, Medicaid and utility assistance. (347)082-0214 ? [facility]: Transportation to medical appointments. 757-499-6016 ? Social Security Administration: May apply for disability if have a Stage IV cancer. 430-474-4009 (863) 561-6476 ? [employer], Disability and Transit Services: Assists with nutrition, care and transit needs. [place] Support Programs: @10RELATIVEDAYS @ > Cancer Support Group  2nd Tuesday of the month 1pm-2pm, Journey Room  > Creative Journey  3rd Tuesday of the month 1130am-1pm, Journey Room  > Look Good Feel Better  1st Wednesday of the month 10am-12 noon, Journey Room (Call [place] to register [phone])

## 2015-08-12 ENCOUNTER — Encounter (HOSPITAL_COMMUNITY): Payer: Self-pay | Admitting: Hematology & Oncology

## 2015-08-12 DIAGNOSIS — D500 Iron deficiency anemia secondary to blood loss (chronic): Secondary | ICD-10-CM | POA: Insufficient documentation

## 2015-08-13 ENCOUNTER — Other Ambulatory Visit: Payer: Self-pay

## 2015-08-13 ENCOUNTER — Ambulatory Visit (HOSPITAL_COMMUNITY): Payer: Medicare PPO | Attending: Cardiology

## 2015-08-13 ENCOUNTER — Ambulatory Visit (INDEPENDENT_AMBULATORY_CARE_PROVIDER_SITE_OTHER): Payer: Medicare PPO | Admitting: Cardiovascular Disease

## 2015-08-13 ENCOUNTER — Encounter: Payer: Self-pay | Admitting: Cardiovascular Disease

## 2015-08-13 VITALS — BP 124/80 | HR 90 | Ht 66.0 in | Wt 244.1 lb

## 2015-08-13 DIAGNOSIS — Z952 Presence of prosthetic heart valve: Secondary | ICD-10-CM

## 2015-08-13 DIAGNOSIS — I059 Rheumatic mitral valve disease, unspecified: Secondary | ICD-10-CM | POA: Insufficient documentation

## 2015-08-13 DIAGNOSIS — I429 Cardiomyopathy, unspecified: Secondary | ICD-10-CM | POA: Insufficient documentation

## 2015-08-13 DIAGNOSIS — Z954 Presence of other heart-valve replacement: Secondary | ICD-10-CM

## 2015-08-13 DIAGNOSIS — I350 Nonrheumatic aortic (valve) stenosis: Secondary | ICD-10-CM | POA: Insufficient documentation

## 2015-08-13 DIAGNOSIS — Z6838 Body mass index (BMI) 38.0-38.9, adult: Secondary | ICD-10-CM | POA: Diagnosis not present

## 2015-08-13 DIAGNOSIS — Z953 Presence of xenogenic heart valve: Secondary | ICD-10-CM | POA: Diagnosis not present

## 2015-08-13 DIAGNOSIS — I119 Hypertensive heart disease without heart failure: Secondary | ICD-10-CM | POA: Diagnosis not present

## 2015-08-13 DIAGNOSIS — I313 Pericardial effusion (noninflammatory): Secondary | ICD-10-CM | POA: Diagnosis not present

## 2015-08-13 DIAGNOSIS — E669 Obesity, unspecified: Secondary | ICD-10-CM | POA: Diagnosis not present

## 2015-08-13 LAB — ECHOCARDIOGRAM COMPLETE
AO mean calculated velocity dopler: 140 cm/s
AOASC: 36 cm
AOPV: 0.37 m/s
AV Mean grad: 9 mmHg
AV Peak grad: 15 mmHg
AV VEL mean LVOT/AV: 0.4
AV pk vel: 195 cm/s
CHL CUP DOP CALC LVOT VTI: 12.9 cm
CHL CUP MV DEC (S): 256
EERAT: 14.45
EWDT: 256 ms
FS: 24 % — AB (ref 28–44)
IVS/LV PW RATIO, ED: 1.13
LA diam index: 2.21 cm/m2
LA vol: 68 mL
LASIZE: 51 mm
LAVOLA4C: 66 mL
LAVOLIN: 29.5 mL/m2
LEFT ATRIUM END SYS DIAM: 51 mm
LV E/e' medial: 14.45
LVEEAVG: 14.45
LVELAT: 5.89 cm/s
LVOT peak grad rest: 2 mmHg
LVOTPV: 72.7 cm/s
LVOTVTI: 0.37 cm
Lateral S' vel: 11.4 cm/s
MV Peak grad: 3 mmHg
MV pk E vel: 85.1 m/s
MVPKAVEL: 160 m/s
PW: 15.1 mm — AB (ref 0.6–1.1)
TDI e' lateral: 5.89
TDI e' medial: 7.02
VTI: 35.1 cm

## 2015-08-13 NOTE — Progress Notes (Signed)
Chief Complaint  Patient presents with  . Severe Aortic Stenosis    no cp,sob,lee,or claudication   History of Present Illness: 70 yo male with history of severe aortic valve stenosis who is here today for one month TAVR follow up. He is followed in the [facility] office by Dr. Jacinta Shoe. He has been known to have severe aortic valve stenosis and is now s/p TAVR on 07/08/15 with a 29 mm  Edwards Sapien 3 valve from the transfemoral approach. He did well following the procedure and was discharged on 07/10/15 on ASA and coumadin.  Post-procedure echo with normal LV function, normally functioning bioprosthetic AVR.   He is here today for one month TAVR follow up. He is feeling great. Rare dyspnea. No chest pain. No LE edema.   Primary Care Physician: Shade Flood, MD Cardiology: Jacinta Shoe    Past Medical History  Diagnosis Date  . Asthma   . Hypertension   . COPD (chronic obstructive pulmonary disease) ([place])   . Cancer [facility])     Prostate   . Helicobacter pylori gastritis MAR 2016  . Multiple duodenal ulcers MAR 2016  . Atrial flutter ([place])   . Aortic valve stenosis   . Cardiomyopathy ([place])   . Mitral regurgitation   . Gout     Past Surgical History  Procedure Laterality Date  . Prostate surgery    . Hernia repair    . Esophagogastroduodenoscopy N/A 05/31/2014    H PYLORI GASTRITIS, DUODENAL ULCERS  . Colonoscopy    . Colonoscopy N/A 08/25/2014    Rehman:examination performed to cecum. moderate number of diverticula at sigmoid colon without stigmata of bleed small external hemorrhoids and anal papillae  . Cardiac catheterization N/A 03/26/2015    Procedure: Right/Left Heart Cath and Coronary Angiography;  Surgeon: Burnell Blanks, MD;  Location: [place] CV LAB;  Service: Cardiovascular;  Laterality: N/A;  . Givens capsule study N/A 04/18/2015    Procedure: GIVENS CAPSULE STUDY;  Surgeon: Danie Binder, MD;  [facility];  Service:  Endoscopy;  Laterality: N/A;  . Transcatheter aortic valve replacement, transfemoral N/A 07/08/2015    Procedure: TRANSCATHETER AORTIC VALVE REPLACEMENT, TRANSFEMORAL;  Surgeon: Burnell Blanks, MD;  Location: [place];  Service: Open Heart Surgery;  Laterality: N/A;  . Tee without cardioversion N/A 07/08/2015    Procedure: TRANSESOPHAGEAL ECHOCARDIOGRAM (TEE);  Surgeon: Burnell Blanks, MD;  Location: [place];  Service: Open Heart Surgery;  Laterality: N/A;    Current Outpatient Prescriptions  Medication Sig Dispense Refill  . albuterol (PROVENTIL) (2.5 MG/3ML) 0.083% nebulizer solution Take 3 mLs (2.5 mg total) by nebulization every 4 (four) hours as needed for wheezing or shortness of breath. 75 mL 12  . allopurinol (ZYLOPRIM) 100 MG tablet Take 100 mg by mouth daily.     Marland Kitchen aspirin 81 MG chewable tablet Chew 1 tablet (81 mg total) by mouth daily. 30 tablet 0  . carvedilol (COREG) 6.25 MG tablet Take 1.5 tablets (9.375 mg total) by mouth 2 (two) times daily. 180 tablet 3  . colchicine 0.6 MG tablet Take 1 tablet (0.6 mg total) by mouth daily. 20 tablet 0  . Fluticasone-Salmeterol (ADVAIR DISKUS) 250-50 MCG/DOSE AEPB Inhale 1 puff into the lungs daily. 60 each 1  . gabapentin (NEURONTIN) 600 MG tablet Take 300 mg by mouth 3 (three) times daily as needed (for pain/neuropathy).     . Iron Polysacch Cmplx-B12-FA 150-0.025-1 MG CAPS Take 1 capsule by mouth 2 (two)  times daily. 60 each 0  . pantoprazole (PROTONIX) 40 MG tablet Take 1 tablet (40 mg total) by mouth daily. 60 tablet 5  . pravastatin (PRAVACHOL) 40 MG tablet Take 1 tablet by mouth at bedtime.     . VENTOLIN HFA 108 (90 BASE) MCG/ACT inhaler Inhale 1 puff into the lungs every 6 (six) hours as needed for wheezing or shortness of breath. Reported on 07/02/2015    . warfarin (COUMADIN) 1 MG tablet Take 3.5 tablets (3.5 mg total) by mouth daily at 6 PM. 60 tablet 0  . zolpidem (AMBIEN) 10 MG tablet Take 0.5 tablets (5 mg total) by mouth  at bedtime as needed for sleep. 30 tablet 0   No current facility-administered medications for this visit.    Allergies  Allergen Reactions  . Gabapentin Other (See Comments)    Capsule causes bad heartburn    Social History   Social History  . Marital Status: Married    Spouse Name: N/A  . Number of Children: N/A  . Years of Education: N/A   Occupational History  . Not on file.   Social History Main Topics  . Smoking status: Former Smoker -- 2.00 packs/day for 20 years    Types: Cigarettes    Start date: 09/01/1959    Quit date: 09/01/1978  . Smokeless tobacco: Former Systems developer     Comment: quit 30 years ago  . Alcohol Use: No  . Drug Use: Yes    Special: Methaqualone  . Sexual Activity: Not on file   Other Topics Concern  . Not on file   Social History Narrative    Family History  Problem Relation Age of Onset  . Stroke Mother   . Cancer Brother   . Cancer Other   . Colon cancer Neg Hx   . Colon polyps Neg Hx     Review of Systems:  As stated in the HPI and otherwise negative.   BP 124/80 mmHg  Pulse 90  Ht 5\' 6"  (1.676 m)  Wt 244 lb 1.9 oz (110.732 kg)  BMI 39.42 kg/m2  Physical Examination: General: Well developed, well nourished, NAD HEENT: OP clear, mucus membranes moist SKIN: warm, dry. No rashes. Neuro: No focal deficits Musculoskeletal: Muscle strength 5/5 all ext Psychiatric: Mood and affect normal Neck: No JVD, no carotid bruits, no thyromegaly, no lymphadenopathy. Lungs:Clear bilaterally, no wheezes, rhonci, crackles Cardiovascular: Regular rate and rhythm. No murmurs, gallops or rubs. Abdomen:Soft. Bowel sounds present. Non-tender.  Extremities: No lower extremity edema. Pulses are 2 + in the bilateral DP/PT.  EKG:  EKG is not ordered today. The ekg ordered today demonstrates   Echo 08/13/15: Normal LV function Normally functioning AVR. No AI or paravalvular leak.   Recent Labs: 04/30/2015: TSH 0.156* 07/04/2015: ALT 13* 07/09/2015:  Magnesium 1.8 07/10/2015: BUN 10; Creatinine, Ser 0.98; Potassium 3.7; Sodium 142 08/08/2015: Hemoglobin 12.6*; Platelets 214   Lipid Panel    Component Value Date/Time   CHOL 166 03/26/2015 0416   TRIG 71 03/26/2015 0416   HDL 46 03/26/2015 0416   CHOLHDL 3.6 03/26/2015 0416   VLDL 14 03/26/2015 0416   LDLCALC 106* 03/26/2015 0416     Wt Readings from Last 3 Encounters:  08/13/15 244 lb 1.9 oz (110.732 kg)  08/08/15 240 lb 12.8 oz (109.226 kg)  07/21/15 241 lb (109.317 kg)     Other studies Reviewed: Additional studies/ records that were reviewed today include: . Review of the above records demonstrates:    Assessment and  Plan:   1. Severe aortic valve stenosis: He is now one month s/p TAVR. He is doing well. NYHA class 2. Echo today shows normal LV function with normally functioning AVR. Will continue ASA and coumadin. I will see him in one year with repeat echo. He will continue following with DR. Koneswaran for his cardiac issues.   Current medicines are reviewed at length with the patient today.  The patient does not have concerns regarding medicines.  The following changes have been made:  no change  Labs/ tests ordered today include:  No orders of the defined types were placed in this encounter.    Disposition:   FU with me in 11  months  Signed, Lauree Chandler, MD 08/13/2015 4:10 PM    [facility] [address], [place], [place]  [address] Phone: [phone]; Fax: [phone]

## 2015-08-13 NOTE — Patient Instructions (Signed)
Medication Instructions:  Your physician recommends that you continue on your current medications as directed. Please refer to the Current Medication list given to you today.   Labwork: none  Testing/Procedures:   Your physician has requested that you have an echocardiogram. Echocardiography is a painless test that uses sound waves to create images of your heart. It provides your doctor with information about the size and shape of your heart and how well your heart's chambers and valves are working. This procedure takes approximately one hour. There are no restrictions for this procedure.  To be done in about 11 months when you see Dr. Angelena Form     Follow-Up: Your physician wants you to follow-up in: about 11 months with Dr. Angelena Form.  You will receive a reminder letter in the mail two months in advance. If you don't receive a letter, please call our office to schedule the follow-up appointment.  Keep scheduled appointment with Dr. Domenic Polite  Any Other Special Instructions Will Be Listed Below (If Applicable).     If you need a refill on your cardiac medications before your next appointment, please call your pharmacy.

## 2015-08-26 ENCOUNTER — Ambulatory Visit (HOSPITAL_COMMUNITY): Payer: Medicare PPO

## 2015-08-28 ENCOUNTER — Encounter (HOSPITAL_BASED_OUTPATIENT_CLINIC_OR_DEPARTMENT_OTHER): Payer: Medicare PPO

## 2015-08-28 ENCOUNTER — Encounter (HOSPITAL_COMMUNITY): Payer: Self-pay

## 2015-08-28 VITALS — BP 135/55 | HR 69 | Temp 97.3°F | Resp 18

## 2015-08-28 DIAGNOSIS — D500 Iron deficiency anemia secondary to blood loss (chronic): Secondary | ICD-10-CM

## 2015-08-28 MED ORDER — SODIUM CHLORIDE 0.9 % IV SOLN
INTRAVENOUS | Status: AC
  Administered 2015-08-28: 14:00:00 via INTRAVENOUS

## 2015-08-28 MED ORDER — SODIUM CHLORIDE 0.9 % IV SOLN
510.0000 mg | Freq: Once | INTRAVENOUS | Status: AC
  Administered 2015-08-28: 510 mg via INTRAVENOUS
  Filled 2015-08-28: qty 17

## 2015-08-28 NOTE — Progress Notes (Unsigned)
IV iron given today per orders. Tolerated well, no problems

## 2015-08-28 NOTE — Patient Instructions (Signed)
[place] at [facility] Discharge Instructions  RECOMMENDATIONS MADE BY THE CONSULTANT AND ANY TEST RESULTS WILL BE SENT TO YOUR REFERRING PHYSICIAN.  IV iron given today  Follow up as scheduled Call us if you have any questions.  Thank you for choosing [place] at [facility] to provide your oncology and hematology care.  To afford each patient quality time with our provider, please arrive at least 15 minutes before your scheduled appointment time.   Beginning January 23rd 2017 lab work for the [facility] will be done in the  Main lab at [facility] on 1st floor. If you have a lab appointment with the [facility] please come in thru the  Main Entrance and check in at the main information desk  You need to re-schedule your appointment should you arrive 10 or more minutes late.  We strive to give you quality time with our providers, and arriving late affects you and other patients whose appointments are after yours.  Also, if you no show three or more times for appointments you may be dismissed from the clinic at the providers discretion.     Again, thank you for choosing [facility].  Our hope is that these requests will decrease the amount of time that you wait before being seen by our physicians.       _____________________________________________________________  Should you have questions after your visit to [facility], please contact our office at [phone] between the hours of 8:30 a.m. and 4:30 p.m.  Voicemails left after 4:30 p.m. will not be returned until the following business day.  For prescription refill requests, have your pharmacy contact our office.         Resources For Cancer Patients and their Caregivers ? American Cancer Society: Can assist with transportation, wigs, general needs, runs Look Good Feel Better.        585-320-4622 ? Cancer Care: Provides financial assistance,  online support groups, medication/co-pay assistance.  1-800-813-HOPE 787-643-8850) ? [place] Assists [place] cancer patients and their families through emotional , educational and financial support.  332-246-3647 ? [place] DSS Where to apply for food stamps, Medicaid and utility assistance. (831)615-6325 ? [facility]: Transportation to medical appointments. 334-079-3342 ? Social Security Administration: May apply for disability if have a Stage IV cancer. 854-572-6017 727-025-6357 ? [employer], Disability and Transit Services: Assists with nutrition, care and transit needs. [facility] Support Programs: @10RELATIVEDAYS @ > Cancer Support Group  2nd Tuesday of the month 1pm-2pm, Journey Room  > Creative Journey  3rd Tuesday of the month 1130am-1pm, Journey Room  > Look Good Feel Better  1st Wednesday of the month 10am-12 noon, Journey Room (Call [place] to register [phone])

## 2015-09-26 ENCOUNTER — Encounter (HOSPITAL_COMMUNITY): Admission: RE | Admit: 2015-09-26 | Payer: Medicare PPO | Source: Ambulatory Visit

## 2015-09-29 ENCOUNTER — Encounter (HOSPITAL_COMMUNITY): Payer: Self-pay | Admitting: Emergency Medicine

## 2015-09-29 ENCOUNTER — Emergency Department (HOSPITAL_COMMUNITY): Payer: Medicare PPO

## 2015-09-29 ENCOUNTER — Inpatient Hospital Stay (HOSPITAL_COMMUNITY)
Admission: EM | Admit: 2015-09-29 | Discharge: 2015-10-01 | DRG: 191 | Disposition: A | Discharge status: Home / Self Care | Payer: Medicare PPO | Attending: Internal Medicine | Admitting: Internal Medicine

## 2015-09-29 DIAGNOSIS — Z6838 Body mass index (BMI) 38.0-38.9, adult: Secondary | ICD-10-CM

## 2015-09-29 DIAGNOSIS — I4892 Unspecified atrial flutter: Secondary | ICD-10-CM | POA: Diagnosis present

## 2015-09-29 DIAGNOSIS — E669 Obesity, unspecified: Secondary | ICD-10-CM | POA: Diagnosis present

## 2015-09-29 DIAGNOSIS — N183 Chronic kidney disease, stage 3 (moderate): Secondary | ICD-10-CM | POA: Diagnosis present

## 2015-09-29 DIAGNOSIS — I130 Hypertensive heart and chronic kidney disease with heart failure and stage 1 through stage 4 chronic kidney disease, or unspecified chronic kidney disease: Secondary | ICD-10-CM | POA: Diagnosis present

## 2015-09-29 DIAGNOSIS — Z7901 Long term (current) use of anticoagulants: Secondary | ICD-10-CM

## 2015-09-29 DIAGNOSIS — R0609 Other forms of dyspnea: Secondary | ICD-10-CM

## 2015-09-29 DIAGNOSIS — Z7982 Long term (current) use of aspirin: Secondary | ICD-10-CM

## 2015-09-29 DIAGNOSIS — Z7951 Long term (current) use of inhaled steroids: Secondary | ICD-10-CM

## 2015-09-29 DIAGNOSIS — Z823 Family history of stroke: Secondary | ICD-10-CM

## 2015-09-29 DIAGNOSIS — R778 Other specified abnormalities of plasma proteins: Secondary | ICD-10-CM | POA: Diagnosis present

## 2015-09-29 DIAGNOSIS — Z952 Presence of prosthetic heart valve: Secondary | ICD-10-CM

## 2015-09-29 DIAGNOSIS — Z954 Presence of other heart-valve replacement: Secondary | ICD-10-CM | POA: Diagnosis not present

## 2015-09-29 DIAGNOSIS — J441 Chronic obstructive pulmonary disease with (acute) exacerbation: Principal | ICD-10-CM | POA: Diagnosis present

## 2015-09-29 DIAGNOSIS — Z87891 Personal history of nicotine dependence: Secondary | ICD-10-CM

## 2015-09-29 DIAGNOSIS — I5032 Chronic diastolic (congestive) heart failure: Secondary | ICD-10-CM | POA: Diagnosis present

## 2015-09-29 DIAGNOSIS — I4891 Unspecified atrial fibrillation: Secondary | ICD-10-CM | POA: Diagnosis present

## 2015-09-29 DIAGNOSIS — Z8711 Personal history of peptic ulcer disease: Secondary | ICD-10-CM

## 2015-09-29 DIAGNOSIS — I429 Cardiomyopathy, unspecified: Secondary | ICD-10-CM | POA: Diagnosis present

## 2015-09-29 DIAGNOSIS — Z8042 Family history of malignant neoplasm of prostate: Secondary | ICD-10-CM

## 2015-09-29 DIAGNOSIS — I252 Old myocardial infarction: Secondary | ICD-10-CM

## 2015-09-29 DIAGNOSIS — R7989 Other specified abnormal findings of blood chemistry: Secondary | ICD-10-CM | POA: Diagnosis not present

## 2015-09-29 DIAGNOSIS — K219 Gastro-esophageal reflux disease without esophagitis: Secondary | ICD-10-CM | POA: Diagnosis present

## 2015-09-29 LAB — CBC WITH DIFFERENTIAL/PLATELET
Basophils Absolute: 0.1 10*3/uL (ref 0.0–0.1)
Basophils Relative: 1 %
Eosinophils Absolute: 0.4 10*3/uL (ref 0.0–0.7)
Eosinophils Relative: 4 %
HEMATOCRIT: 38.1 % — AB (ref 39.0–52.0)
Hemoglobin: 12.9 g/dL — ABNORMAL LOW (ref 13.0–17.0)
LYMPHS PCT: 17 %
Lymphs Abs: 1.7 10*3/uL (ref 0.7–4.0)
MCH: 30.9 pg (ref 26.0–34.0)
MCHC: 33.9 g/dL (ref 30.0–36.0)
MCV: 91.4 fL (ref 78.0–100.0)
MONO ABS: 0.5 10*3/uL (ref 0.1–1.0)
MONOS PCT: 6 %
NEUTROS ABS: 7 10*3/uL (ref 1.7–7.7)
Neutrophils Relative %: 72 %
Platelets: 261 10*3/uL (ref 150–400)
RBC: 4.17 MIL/uL — ABNORMAL LOW (ref 4.22–5.81)
RDW: 14.6 % (ref 11.5–15.5)
WBC: 9.7 10*3/uL (ref 4.0–10.5)

## 2015-09-29 LAB — COMPREHENSIVE METABOLIC PANEL
ALT: 14 U/L — ABNORMAL LOW (ref 17–63)
ANION GAP: 7 (ref 5–15)
AST: 18 U/L (ref 15–41)
Albumin: 3.7 g/dL (ref 3.5–5.0)
Alkaline Phosphatase: 73 U/L (ref 38–126)
BILIRUBIN TOTAL: 0.6 mg/dL (ref 0.3–1.2)
BUN: 19 mg/dL (ref 6–20)
CO2: 32 mmol/L (ref 22–32)
Calcium: 8.8 mg/dL — ABNORMAL LOW (ref 8.9–10.3)
Chloride: 99 mmol/L — ABNORMAL LOW (ref 101–111)
Creatinine, Ser: 1.16 mg/dL (ref 0.61–1.24)
GFR calc Af Amer: >60 mL/min (ref >60)
Glucose, Bld: 111 mg/dL — ABNORMAL HIGH (ref 65–99)
POTASSIUM: 3.8 mmol/L (ref 3.5–5.1)
Sodium: 138 mmol/L (ref 135–145)
TOTAL PROTEIN: 7.3 g/dL (ref 6.5–8.1)

## 2015-09-29 LAB — PROTIME-INR
INR: 3 — ABNORMAL HIGH (ref 0.00–1.49)
Prothrombin Time: 30.6 seconds — ABNORMAL HIGH (ref 11.6–15.2)

## 2015-09-29 LAB — TROPONIN I: Troponin I: 0.06 ng/mL (ref <0.03)

## 2015-09-29 LAB — BRAIN NATRIURETIC PEPTIDE: B NATRIURETIC PEPTIDE 5: 59 pg/mL (ref 0.0–100.0)

## 2015-09-29 LAB — D-DIMER, QUANTITATIVE: D-Dimer, Quant: 3.04 ug/mL-FEU — ABNORMAL HIGH (ref 0.00–0.50)

## 2015-09-29 MED ORDER — METHYLPREDNISOLONE SODIUM SUCC 125 MG IJ SOLR
125.0000 mg | Freq: Once | INTRAMUSCULAR | Status: AC
  Administered 2015-09-29: 125 mg via INTRAVENOUS
  Filled 2015-09-29: qty 2

## 2015-09-29 MED ORDER — ALBUTEROL SULFATE (2.5 MG/3ML) 0.083% IN NEBU
2.5000 mg | INHALATION_SOLUTION | Freq: Once | RESPIRATORY_TRACT | Status: AC
  Administered 2015-09-29: 2.5 mg via RESPIRATORY_TRACT
  Filled 2015-09-29: qty 3

## 2015-09-29 MED ORDER — PREDNISONE 20 MG PO TABS: ORAL_TABLET | ORAL | 0 refills | Status: DC

## 2015-09-29 MED ORDER — IPRATROPIUM-ALBUTEROL 0.5-2.5 (3) MG/3ML IN SOLN
3.0000 mL | Freq: Once | RESPIRATORY_TRACT | Status: AC
  Administered 2015-09-29: 3 mL via RESPIRATORY_TRACT
  Filled 2015-09-29: qty 3

## 2015-09-29 MED ORDER — IOPAMIDOL (ISOVUE-370) INJECTION 76%
100.0000 mL | Freq: Once | INTRAVENOUS | Status: AC | PRN
  Administered 2015-09-29: 100 mL via INTRAVENOUS

## 2015-09-29 NOTE — ED Provider Notes (Signed)
[facility] DEPT Provider Note   CSN: [id number] Arrival date & time: 09/29/15  1045  By signing my name below, I, Emmanuella Mensah, attest that this documentation has been prepared under the direction and in the presence of Milton Ferguson, MD. Electronically Signed: Judithann Sauger, ED Scribe. 09/29/15. 11:30 AM.   History   Chief Complaint Chief Complaint  Patient presents with  . Shortness of Breath   HPI Comments: Ryan Shea is a 70 y.o. male with a hx of asthma, aortic valve stenosis, A-fib, COPD, and hypertension who presents to the Emergency Department complaining of ongoing episodes of moderate shortness of breath onset 2 days ago. He reports associated fatigue and mild bilateral ankle swelling. No alleviating factors noted. Pt has not tried any medications PTA. He denies any home O2 use. Pt reports that he had an aortic valve replacement in May 2017 in [place], [place]. He denies any current pain, fever, chills, chest pain, or dizziness.   PCP: [facility]  The history is provided by the patient. No language interpreter was used.  Shortness of Breath  This is a new problem. The problem occurs frequently.The current episode started 2 days ago. The problem has been gradually worsening. Associated symptoms include leg swelling. Pertinent negatives include no fever, no headaches, no cough, no chest pain, no syncope, no abdominal pain and no rash. Precipitated by: Exertion. Risk factors: COPD. He has tried nothing for the symptoms. Associated medical issues include asthma and COPD.    Past Medical History:  Diagnosis Date  . Aortic valve stenosis   . Asthma   . Atrial flutter ([place])   . Cancer [facility])    Prostate   . Cardiomyopathy ([place])   . COPD (chronic obstructive pulmonary disease) ([place])   . Gout   . Helicobacter pylori gastritis MAR 2016  . Hypertension   . Mitral regurgitation   . Multiple duodenal ulcers MAR 2016    Patient Active Problem List    Diagnosis Date Noted  . Iron deficiency anemia due to chronic blood loss 08/12/2015  . Severe aortic stenosis 07/08/2015  . Blood in stool   . NSTEMI (non-ST elevated myocardial infarction) ([place]) 03/22/2015  . CKD (chronic kidney disease) stage 3, GFR 30-59 ml/min 03/22/2015  . Morbid obesity ([place]) 03/22/2015  . Elevated troponin   . Acute renal failure ([place]) 08/24/2014  . Chronic anticoagulation 08/24/2014  . GI bleed 08/23/2014  . Anemia 08/23/2014  . Rectal bleeding 08/23/2014  . Colon cancer screening 07/11/2014  . Multiple duodenal ulcers 05/30/2014  . Aortic stenosis 04/23/2013  . Mitral regurgitation 04/23/2013  . Cardiomyopathy ([place]) 04/23/2013  . Paroxysmal atrial flutter ([place]) 04/23/2013  . HTN (hypertension) 04/20/2013  . High cholesterol 04/20/2013  . COPD (chronic obstructive pulmonary disease) ([place]) 04/20/2013  . Prostate cancer ([place]) 04/20/2013  . Backache, unspecified 04/20/2013  . Body mass index 40.0-44.9, adult ([place]) 04/20/2013    Past Surgical History:  Procedure Laterality Date  . CARDIAC CATHETERIZATION N/A 03/26/2015   Procedure: Right/Left Heart Cath and Coronary Angiography;  Surgeon: Burnell Blanks, MD;  Location: [place] CV LAB;  Service: Cardiovascular;  Laterality: N/A;  . COLONOSCOPY    . COLONOSCOPY N/A 08/25/2014   Rehman:examination performed to cecum. moderate number of diverticula at sigmoid colon without stigmata of bleed small external hemorrhoids and anal papillae  . ESOPHAGOGASTRODUODENOSCOPY N/A 05/31/2014   H PYLORI GASTRITIS, DUODENAL ULCERS  . GIVENS CAPSULE STUDY N/A 04/18/2015   Procedure: GIVENS CAPSULE STUDY;  Surgeon:  Danie Binder, MD;  [facility];  Service: Endoscopy;  Laterality: N/A;  . HERNIA REPAIR    . PROSTATE SURGERY    . TEE WITHOUT CARDIOVERSION N/A 07/08/2015   Procedure: TRANSESOPHAGEAL ECHOCARDIOGRAM (TEE);  Surgeon: Burnell Blanks, MD;  Location: [place];  Service: Open Heart Surgery;   Laterality: N/A;  . TRANSCATHETER AORTIC VALVE REPLACEMENT, TRANSFEMORAL N/A 07/08/2015   Procedure: TRANSCATHETER AORTIC VALVE REPLACEMENT, TRANSFEMORAL;  Surgeon: Burnell Blanks, MD;  Location: [facility];  Service: Open Heart Surgery;  Laterality: N/A;       Home Medications    Prior to Admission medications   Medication Sig Start Date End Date Taking? Authorizing Provider  albuterol (PROVENTIL) (2.5 MG/3ML) 0.083% nebulizer solution Take 3 mLs (2.5 mg total) by nebulization every 4 (four) hours as needed for wheezing or shortness of breath. 03/03/15   Kathie Dike, MD  allopurinol (ZYLOPRIM) 100 MG tablet Take 100 mg by mouth daily.  03/06/15   Historical Provider, MD  aspirin 81 MG chewable tablet Chew 1 tablet (81 mg total) by mouth daily. 03/27/15   Florencia Reasons, MD  carvedilol (COREG) 6.25 MG tablet Take 1.5 tablets (9.375 mg total) by mouth 2 (two) times daily. 07/21/15   Lendon Colonel, NP  colchicine 0.6 MG tablet Take 1 tablet (0.6 mg total) by mouth daily. 03/03/15   Kathie Dike, MD  Fluticasone-Salmeterol (ADVAIR DISKUS) 250-50 MCG/DOSE AEPB Inhale 1 puff into the lungs daily. 08/25/14   Kathie Dike, MD  gabapentin (NEURONTIN) 600 MG tablet Take 300 mg by mouth 3 (three) times daily as needed (for pain/neuropathy).  05/08/15   Historical Provider, MD  Iron Polysacch Cmplx-B12-FA 150-0.025-1 MG CAPS Take 1 capsule by mouth 2 (two) times daily. 05/14/15   Patrici Ranks, MD  pantoprazole (PROTONIX) 40 MG tablet Take 1 tablet (40 mg total) by mouth daily. 08/25/14   Kathie Dike, MD  pravastatin (PRAVACHOL) 40 MG tablet Take 1 tablet by mouth at bedtime.  07/06/13   Historical Provider, MD  VENTOLIN HFA 108 (90 BASE) MCG/ACT inhaler Inhale 1 puff into the lungs every 6 (six) hours as needed for wheezing or shortness of breath. Reported on 07/02/2015 05/10/14   Historical Provider, MD  warfarin (COUMADIN) 1 MG tablet Take 3.5 tablets (3.5 mg total) by mouth daily at 6 PM. 03/27/15    Florencia Reasons, MD  zolpidem (AMBIEN) 10 MG tablet Take 0.5 tablets (5 mg total) by mouth at bedtime as needed for sleep. 03/27/15   Florencia Reasons, MD    Family History Family History  Problem Relation Age of Onset  . Stroke Mother   . Cancer Brother   . Cancer Other   . Colon cancer Neg Hx   . Colon polyps Neg Hx     Social History Social History  Substance Use Topics  . Smoking status: Former Smoker    Packs/day: 2.00    Years: 20.00    Types: Cigarettes    Start date: 09/01/1959    Quit date: 09/01/1978  . Smokeless tobacco: Former Systems developer     Comment: quit 30 years ago  . Alcohol use No     Allergies   Gabapentin   Review of Systems Review of Systems  Constitutional: Positive for fatigue. Negative for appetite change and fever.  HENT: Negative for congestion, ear discharge and sinus pressure.   Eyes: Negative for discharge.  Respiratory: Positive for shortness of breath. Negative for cough.   Cardiovascular: Positive for leg  swelling. Negative for chest pain and syncope.  Gastrointestinal: Negative for abdominal pain and diarrhea.  Genitourinary: Negative for frequency and hematuria.  Musculoskeletal: Negative for back pain.  Skin: Negative for rash.  Neurological: Negative for seizures and headaches.  Psychiatric/Behavioral: Negative for hallucinations.     Physical Exam Updated Vital Signs BP 157/72 (BP Location: Right Arm)   Pulse 83   Temp 97.8 F (36.6 C) (Oral)   Resp 16   Ht 5\' 6"  (1.676 m)   Wt 240 lb (108.9 kg)   SpO2 92%   BMI 38.74 kg/m   Physical Exam  Constitutional: He is oriented to person, place, and time. He appears well-developed.  HENT:  Head: Normocephalic.  Eyes: Conjunctivae and EOM are normal. No scleral icterus.  Neck: Neck supple. No thyromegaly present.  Cardiovascular: Normal rate and regular rhythm.  Exam reveals no gallop and no friction rub.   No murmur heard. Pulmonary/Chest: No stridor. He has wheezes (minimal bilaterally). He has  no rales. He exhibits no tenderness.  Abdominal: Soft. He exhibits no distension. There is no tenderness. There is no rebound.  Musculoskeletal: Normal range of motion. He exhibits no edema.  1+ edema bilateral ankles  Lymphadenopathy:    He has no cervical adenopathy.  Neurological: He is oriented to person, place, and time. He exhibits normal muscle tone. Coordination normal.  Skin: No rash noted. No erythema.  Psychiatric: He has a normal mood and affect. His behavior is normal.     ED Treatments / Results  DIAGNOSTIC STUDIES: Oxygen Saturation is 92% on RA, low by my interpretation.    COORDINATION OF CARE: 11:26 AM- Pt advised of plan for treatment and pt agrees. Pt will receive lab work and chest x-ray for further evaluation.    Labs (all labs ordered are listed, but only abnormal results are displayed) Labs Reviewed  CBC WITH DIFFERENTIAL/PLATELET  COMPREHENSIVE METABOLIC PANEL  [place], ED    EKG  EKG Interpretation None       Radiology No results found.  Procedures Procedures (including critical care time)  Medications Ordered in ED Medications - No data to display   Initial Impression / Assessment and Plan / ED Course  Milton Ferguson, MD has reviewed the triage vital signs and the nursing notes.  Pertinent labs & imaging results that were available during my care of the patient were reviewed by me and considered in my medical decision making (see chart for details).  Clinical Course    Patient with shortness of breath first troponin was negative. Suspect COPD exacerbation. Patient has a CT angios pending and a cardiology consult pending if her CT angiogram is negative and cardiology feel comfortable and the patient go home he will be discharged with some prednisone will follow-up with his PCP Final Clinical Impressions(s) / ED Diagnoses   Final diagnoses:  None    New Prescriptions New  Prescriptions   No medications on file     Milton Ferguson, MD 09/29/15 1513

## 2015-09-29 NOTE — H&P (Signed)
TRH H&P   Patient Demographics:    Ryan Shea, is a 70 y.o. male  MRN: [id number]   DOB - [birth year]  Admit Date - 09/29/2015  Outpatient Primary MD for the patient is [facility]  Referring MD/NP/PA:  [place]  Outpatient Specialists:  Dr. Cyndia Bent (CT surgery), Dr. Angelena Form  Patient coming from: home  Chief Complaint  Patient presents with  . Shortness of Breath      HPI:    Ryan Shea  is a 70 y.o. male, w Copd, not on home o2, CAD, TAVR, apparently c/o dyspnea x2 days.  Denies fever, chills, cough, cp, palp, orthopnea, pnd, weight gain. Slight lower ext edema.    Pt thinks that the humidity exacerbated his Copd, had to go out in the heat x2 over the past 2 days.  Dyspnea was worse this am, and therefore presented to ED.    In ED,  Pt had CTA chest due to + d dimer that was negative for PE.  Slight small nodules in the left medial lobe, repeat in 12 months if high risk.  Pt also had slight trop elevation.  Note had cardiac catheterization in 03/2015.  Pt will be admitted for presumed Copd exacerbation and mild trop elevation.    Review of systems:    In addition to the HPI above,  No Fever-chills, No Headache, No changes with Vision or hearing, No problems swallowing food or Liquids, No Chest pain, Cough No Abdominal pain, No Nausea or Vommitting, Bowel movements are regular, No Blood in stool or Urine, No dysuria, No new skin rashes or bruises, No new joints pains-aches,  No new weakness, tingling, numbness in any extremity, No recent weight gain or loss, No polyuria, polydypsia or polyphagia, No significant Mental Stressors.  A full 10 point Review of Systems was done, except as stated above, all other Review of Systems were negative.   With Past History of the following :    Past Medical History:  Diagnosis Date  . Aortic valve  stenosis   . Asthma   . Atrial flutter ([place])   . Cancer [facility])    Prostate   . Cardiomyopathy ([place])   . COPD (chronic obstructive pulmonary disease) ([place])   . Gout   . Helicobacter pylori gastritis MAR 2016  . Hypertension   . Mitral regurgitation   . Multiple duodenal ulcers MAR 2016      Past Surgical History:  Procedure Laterality Date  . CARDIAC CATHETERIZATION N/A 03/26/2015   Procedure: Right/Left Heart Cath and Coronary Angiography;  Surgeon: Burnell Blanks, MD;  Location: [place] CV LAB;  Service: Cardiovascular;  Laterality: N/A;  . COLONOSCOPY    . COLONOSCOPY N/A 08/25/2014   Rehman:examination performed to cecum. moderate number of diverticula at sigmoid colon without stigmata of bleed small external hemorrhoids and anal papillae  . ESOPHAGOGASTRODUODENOSCOPY N/A  05/31/2014   H PYLORI GASTRITIS, DUODENAL ULCERS  . GIVENS CAPSULE STUDY N/A 04/18/2015   Procedure: GIVENS CAPSULE STUDY;  Surgeon: Danie Binder, MD;  [facility];  Service: Endoscopy;  Laterality: N/A;  . HERNIA REPAIR    . PROSTATE SURGERY    . TEE WITHOUT CARDIOVERSION N/A 07/08/2015   Procedure: TRANSESOPHAGEAL ECHOCARDIOGRAM (TEE);  Surgeon: Burnell Blanks, MD;  Location: [place];  Service: Open Heart Surgery;  Laterality: N/A;  . TRANSCATHETER AORTIC VALVE REPLACEMENT, TRANSFEMORAL N/A 07/08/2015   Procedure: TRANSCATHETER AORTIC VALVE REPLACEMENT, TRANSFEMORAL;  Surgeon: Burnell Blanks, MD;  Location: [place];  Service: Open Heart Surgery;  Laterality: N/A;      Social History:     Social History  Substance Use Topics  . Smoking status: Former Smoker    Packs/day: 2.00    Years: 20.00    Types: Cigarettes    Start date: 09/01/1959    Quit date: 09/01/1978  . Smokeless tobacco: Former Systems developer     Comment: quit 30 years ago  . Alcohol use No     Lives - at home  Mobility - ambulates without assistance.    Family History :     Family History  Problem Relation  Age of Onset  . Stroke Mother   . Prostate cancer Brother     [name]  . Cancer Other   . Prostate cancer Brother     [name]  . Colon cancer Neg Hx   . Colon polyps Neg Hx       Home Medications:   Prior to Admission medications   Medication Sig Start Date End Date Taking? Authorizing Provider  allopurinol (ZYLOPRIM) 100 MG tablet Take 100 mg by mouth daily. 09/25/15  Yes Historical Provider, MD  aspirin 81 MG chewable tablet Chew 1 tablet (81 mg total) by mouth daily. 03/27/15  Yes Florencia Reasons, MD  carvedilol (COREG) 6.25 MG tablet Take 1.5 tablets (9.375 mg total) by mouth 2 (two) times daily. 07/21/15  Yes Lendon Colonel, NP  Fluticasone-Salmeterol (ADVAIR DISKUS) 250-50 MCG/DOSE AEPB Inhale 1 puff into the lungs daily. 08/25/14  Yes Kathie Dike, MD  furosemide (LASIX) 40 MG tablet Take 40 mg by mouth daily as needed for edema.   Yes Historical Provider, MD  gabapentin (NEURONTIN) 600 MG tablet Take 300 mg by mouth 3 (three) times daily as needed (for pain/neuropathy).  05/08/15  Yes Historical Provider, MD  Iron Polysacch Cmplx-B12-FA 150-0.025-1 MG CAPS Take 1 capsule by mouth 2 (two) times daily. 05/14/15  Yes Patrici Ranks, MD  pantoprazole (PROTONIX) 40 MG tablet Take 1 tablet (40 mg total) by mouth daily. 08/25/14  Yes Kathie Dike, MD  pravastatin (PRAVACHOL) 40 MG tablet Take 1 tablet by mouth at bedtime.  07/06/13  Yes Historical Provider, MD  warfarin (COUMADIN) 5 MG tablet Take 5 mg by mouth daily.   Yes Historical Provider, MD  zolpidem (AMBIEN) 10 MG tablet Take 0.5 tablets (5 mg total) by mouth at bedtime as needed for sleep. 03/27/15  Yes Florencia Reasons, MD  albuterol (PROVENTIL) (2.5 MG/3ML) 0.083% nebulizer solution Take 3 mLs (2.5 mg total) by nebulization every 4 (four) hours as needed for wheezing or shortness of breath. 03/03/15   Kathie Dike, MD  colchicine 0.6 MG tablet Take 1 tablet (0.6 mg total) by mouth daily. Patient not taking: Reported on 09/29/2015 03/03/15    Kathie Dike, MD  predniSONE (DELTASONE) 20 MG tablet 2 tabs po daily x 3  days 09/29/15   Milton Ferguson, MD  VENTOLIN HFA 108 (90 BASE) MCG/ACT inhaler Inhale 1 puff into the lungs every 6 (six) hours as needed for wheezing or shortness of breath. Reported on 07/02/2015 05/10/14   Historical Provider, MD  warfarin (COUMADIN) 1 MG tablet Take 3.5 tablets (3.5 mg total) by mouth daily at 6 PM. Patient not taking: Reported on 09/29/2015 03/27/15   Florencia Reasons, MD     Allergies:     Allergies  Allergen Reactions  . Gabapentin Other (See Comments)    Capsule causes bad heartburn     Physical Exam:   Vitals  Blood pressure 143/76, pulse 94, temperature 97.8 F (36.6 C), temperature source Oral, resp. rate 17, height 5\' 6"  (1.676 m), weight 108.9 kg (240 lb), SpO2 98 %.   1. General lying in bed in NAD,  obese  2. Normal affect and insight, Not Suicidal or Homicidal, Awake Alert, Oriented X 3.  3. No F.N deficits, ALL C.Nerves Intact, Strength 5/5 all 4 extremities, Sensation intact all 4 extremities, Plantars down going.  4. Ears and Eyes appear Normal, Conjunctivae clear, PERRLA. Moist Oral Mucosa.  5. Supple Neck, No JVD, No cervical lymphadenopathy appriciated, No Carotid Bruits.  6. Symmetrical Chest wall movement, Good air movement bilaterally, slight bilateral exp wheeze, no crackles.   7. RRR, No Gallops, Rubs or Murmurs, No Parasternal Heave.  8. Positive Bowel Sounds, Abdomen Soft, No tenderness, No organomegaly appriciated,No rebound -guarding or rigidity.  9.  No Cyanosis, Normal Skin Turgor, No Skin Rash or Bruise.  10. Good muscle tone,  joints appear normal , no effusions, Normal ROM.  11. No Palpable Lymph Nodes in Neck or Axillae     Data Review:    CBC  Recent Labs Lab 09/29/15 1133  WBC 9.7  HGB 12.9*  HCT 38.1*  PLT 261  MCV 91.4  MCH 30.9  MCHC 33.9  RDW 14.6  LYMPHSABS 1.7  MONOABS 0.5  EOSABS 0.4  BASOSABS 0.1    ------------------------------------------------------------------------------------------------------------------  Chemistries   Recent Labs Lab 09/29/15 1133  NA 138  K 3.8  CL 99*  CO2 32  GLUCOSE 111*  BUN 19  CREATININE 1.16  CALCIUM 8.8*  AST 18  ALT 14*  ALKPHOS 73  BILITOT 0.6   ------------------------------------------------------------------------------------------------------------------ estimated creatinine clearance is 67.6 mL/min (by C-G formula based on SCr of 1.16 mg/dL). ------------------------------------------------------------------------------------------------------------------ No results for input(s): TSH, T4TOTAL, T3FREE, THYROIDAB in the last 72 hours.  Invalid input(s): FREET3  Coagulation profile  Recent Labs Lab 09/29/15 1133  INR 3.00*   -------------------------------------------------------------------------------------------------------------------  Recent Labs  09/29/15 1133  DDIMER 3.04*   -------------------------------------------------------------------------------------------------------------------  Cardiac Enzymes  Recent Labs Lab 09/29/15 1133 09/29/15 1522  TROPONINI <0.03 0.06*   ------------------------------------------------------------------------------------------------------------------    Component Value Date/Time   BNP 59.0 09/29/2015 1133     ---------------------------------------------------------------------------------------------------------------  Urinalysis    Component Value Date/Time   COLORURINE YELLOW 07/04/2015 1122   APPEARANCEUR CLEAR 07/04/2015 1122   LABSPEC 1.023 07/04/2015 1122   PHURINE 5.5 07/04/2015 1122   GLUCOSEU NEGATIVE 07/04/2015 1122   HGBUR NEGATIVE 07/04/2015 1122   BILIRUBINUR NEGATIVE 07/04/2015 1122   KETONESUR NEGATIVE 07/04/2015 1122   PROTEINUR NEGATIVE 07/04/2015 1122   NITRITE NEGATIVE 07/04/2015 1122   LEUKOCYTESUR NEGATIVE 07/04/2015 1122     ----------------------------------------------------------------------------------------------------------------   Imaging Results:    Dg Chest 2 View  Result Date: 09/29/2015 CLINICAL DATA:  Aortic stenosis. Atrial fibrillation. Shortness of breath. EXAM: CHEST  2 VIEW COMPARISON:  Jul 09, 2015 FINDINGS: There is no edema or consolidation. Heart is borderline prominent with pulmonary vascularity within normal limits. No adenopathy. There is degenerative change in thoracic spine. There is a prosthetic aortic valve. There is calcification in the aortic arch region. IMPRESSION: Heart borderline prominent with prosthetic aortic valve in place. No edema or consolidation. Aortic atherosclerosis. Electronically Signed   By: Lowella Grip III M.D.   On: 09/29/2015 12:43  Ct Angio Chest Pe W/cm &/or Wo Cm  Result Date: 09/29/2015 CLINICAL DATA:  Shortness of breath for 3 days. Productive cough. Recent history of heart valve replacement May 2017. EXAM: CT ANGIOGRAPHY CHEST WITH CONTRAST TECHNIQUE: Multidetector CT imaging of the chest was performed using the standard protocol during bolus administration of intravenous contrast. Multiplanar CT image reconstructions and MIPs were obtained to evaluate the vascular anatomy. CONTRAST:  100 mL Isovue 370 COMPARISON:  CTA chest 06/11/2015 associated with the previous valve replacement FINDINGS: Cardiovascular: Endovascular aortic valve replacement is in place. The aorta demonstrates minimal atherosclerotic calcification. There is no aneurysm. There is no significant stenosis of the great vessel origins. Mediastinum/Nodes: Pulmonary arterial opacification is satisfactory. There are no focal filling defects to suggest pulmonary emboli. No significant mediastinal or axillary adenopathy is present. Lungs/Pleura: The lung windows are mildly degraded by patient motion. Small nodules in the left lower lobe medially are stable. No other focal nodule mass, or airspace  disease is present. Upper Abdomen: Scattered calcifications are present in the liver. Gallbladder is within normal limits. Exophytic cyst at the upper pole of the left kidney is stable. Musculoskeletal: Bone windows demonstrate fusion of anterior osteophytes across the lower thoracic spine. No focal lytic or blastic lesions are present. The ribs are intact. The scapula is normal bilaterally. Review of the MIP images confirms the above findings. IMPRESSION: 1. No evidence for pulmonary embolus. 2. Stable small nodules in the medial left lower lobe. These nodules measure less than 5 mm. No follow-up needed if patient is low-risk (and has no known or suspected primary neoplasm). Non-contrast chest CT can be considered in 12 months if patient is high-risk. This recommendation follows the consensus statement: Guidelines for Management of Incidental Pulmonary Nodules Detected on CT Images:From the Fleischner Society 2017; published online before print (10.1148/radiol.IJ:2314499). 3. Status post endovascular aortic valve replacement. Electronically Signed   By: San Morelle M.D.   On: 09/29/2015 17:15   My personal review of EKG: Rhythm NSR, Rate  =85, nl axis, LBBB, old    Assessment & Plan:    Principal Problem:   COPD exacerbation ([place]) Active Problems:   Paroxysmal atrial flutter (H[facility])   Elevated troponin    1. Copd exacerbation Solumedrol 80mg  iv q8h levaquin 500mg  iv qday spiriva 1puff qday Albuterol q6h and q6h prn  2. Dyspnea Check cardiac echo  3. Trop elevation Check trop I q6hx3  4. Aflutter Coumadin pharmacy to dose.    DVT Prophylaxis Coumadin    AM Labs Ordered, also please review Full Orders  Family Communication: Admission, patients condition and plan of care including tests being ordered have been discussed with the patient  who indicate understanding and agree with the plan and Code Status.  Code Status FULL CODE  Likely DC to  home  Condition GUARDED     Consults called:   Admission status: observation  Time spent in minutes : 45 minutes   Jani Gravel M.D on 09/29/2015 at 6:55 PM  Between 7am to 7pm - Pager - [phone] . After 7pm  go to [email or web address] - password [email or web address]  [facility] - Office  [phone]    

## 2015-09-29 NOTE — Consult Note (Signed)
CARDIOLOGY CONSULT NOTE   Patient ID: Ryan Shea MRN: [id number] DOB/AGE: 07-24-1945 70 y.o.  Admit Date: 09/29/2015 Referring Physician:TRH Primary Physician: [facility] Consulting Cardiologist: Kate Sable MD Primary Cardiologist : Kate Sable MD Reason for Consultation: Dyspnea   Clinical Summary Ryan Shea is a 70 y.o.male with known history of AoV disease. Status post planned transcatheter aortic valve replacement on 07/08/2015. His post TAVR echocardiogram revealed no paravalvular leak. Other history includes diastolic heart failure, COPD, and GERD. The patient remains on Coumadin therapy and is followed by [facility] for dosing and labs.  He states over the last 4 days he's had increasing shortness of breath and wheezing. At first he thought it was the humidity. He states that it did not get worse but it never got better, he became fatigued with it, I was concerned that his aortic valve was not functioning.  On arrival to the emergency room the patient's blood pressure was 157/72, heart rate 86 O2 sat 93% he was afebrile. Hemoglobin 12.9 hematocrit 38.1. Sodium 138 creatinine 1.16 potassium 3.8. ALT low at 14. PT 30.6 INR 3.0. Chest x-ray revealed prominent heart borderline with prosthetic aortic valve in place, no evidence of CHF pneumonia or consolidation. Aortic atherosclerosis was noted. EKG revealed left bundle branch block with PACs.  He was treated with nebulizer treatment and IV Solu-Medrol with significant improvement in breathing status. He denies chest pain dizziness, diaphoresis or near syncope. He did have a migraine headache last week but did induce vomiting.  Allergies  Allergen Reactions  . Gabapentin Other (See Comments)    Capsule causes bad heartburn    Medications Scheduled Medications:    Infusions:    PRN Medications:     Past Medical History:  Diagnosis Date  . Aortic valve  stenosis   . Asthma   . Atrial flutter ([place])   . Cancer [facility])    Prostate   . Cardiomyopathy ([place])   . COPD (chronic obstructive pulmonary disease) ([place])   . Gout   . Helicobacter pylori gastritis MAR 2016  . Hypertension   . Mitral regurgitation   . Multiple duodenal ulcers MAR 2016    Past Surgical History:  Procedure Laterality Date  . CARDIAC CATHETERIZATION N/A 03/26/2015   Procedure: Right/Left Heart Cath and Coronary Angiography;  Surgeon: Burnell Blanks, MD;  Location: [place] CV LAB;  Service: Cardiovascular;  Laterality: N/A;  . COLONOSCOPY    . COLONOSCOPY N/A 08/25/2014   Rehman:examination performed to cecum. moderate number of diverticula at sigmoid colon without stigmata of bleed small external hemorrhoids and anal papillae  . ESOPHAGOGASTRODUODENOSCOPY N/A 05/31/2014   H PYLORI GASTRITIS, DUODENAL ULCERS  . GIVENS CAPSULE STUDY N/A 04/18/2015   Procedure: GIVENS CAPSULE STUDY;  Surgeon: Danie Binder, MD;  [facility];  Service: Endoscopy;  Laterality: N/A;  . HERNIA REPAIR    . PROSTATE SURGERY    . TEE WITHOUT CARDIOVERSION N/A 07/08/2015   Procedure: TRANSESOPHAGEAL ECHOCARDIOGRAM (TEE);  Surgeon: Burnell Blanks, MD;  Location: [place];  Service: Open Heart Surgery;  Laterality: N/A;  . TRANSCATHETER AORTIC VALVE REPLACEMENT, TRANSFEMORAL N/A 07/08/2015   Procedure: TRANSCATHETER AORTIC VALVE REPLACEMENT, TRANSFEMORAL;  Surgeon: Burnell Blanks, MD;  Location: [place];  Service: Open Heart Surgery;  Laterality: N/A;    Family History  Problem Relation Age of Onset  . Stroke Mother   . Cancer Brother   . Cancer Other   .  Colon cancer Neg Hx   . Colon polyps Neg Hx      Social History Ryan Shea reports that he quit smoking about 37 years ago. His smoking use included Cigarettes. He started smoking about 56 years ago. He has a 40.00 pack-year smoking history. He has quit using smokeless tobacco. Ryan Shea reports that he does not  drink alcohol.  Review of Systems Complete review of systems are found to be negative unless outlined in H&P above.  Physical Examination Blood pressure 137/70, pulse 82, temperature 97.8 F (36.6 C), temperature source Oral, resp. rate 23, height 5\' 6"  (1.676 m), weight 240 lb (108.9 kg), SpO2 97 %. No intake or output data in the 24 hours ending 09/29/15 1613  Telemetry:  GEN:No acute distress  HEENT: Conjunctiva and lids normal, oropharynx clear with moist mucosa. Neck: Supple, no elevated JVP or carotid bruits, no thyromegaly. Lungs: Inspiratory wheezes with bibasilar crackles, some diminished sounds in the bases as well.  CV:: Regular rate and rhythm, no S3 or significant systolic murmur, no pericardial rub. Abdomen: Soft, nontender, no hepatomegaly, bowel sounds present, no guarding or rebound.Obese  Extremities: No pitting edema, distal pulses 2+. Skin: Warm and dry. Musculoskeletal: No kyphosis. Neuropsychiatric: Alert and oriented x3, affect grossly appropriate.  Prior Cardiac Testing/Procedures 1,. Cardiac Catheterization 03/26/2015 1. No angiographic evidence of CAD 2. Severe aortic valve stenosis (mean gradient 33 mmHg, peak gradient 39 mmHg, AVA 0.95 cm2).  3. Elevated troponin likely due to demand ischemia with severe AS and acute COPD exacerbation.   2. Echocardiogram 08/13/2015 Left ventricle: The cavity size was normal. Wall thickness was   increased in a pattern of moderate LVH. Systolic function was   normal. The estimated ejection fraction was in the range of 55%   to 60%. Basal inferior hypokinesis. Doppler parameters are   consistent with abnormal left ventricular relaxation (grade 1   diastolic dysfunction). - Aortic valve: Bioprosthetic aortic valve s/p TAVR. No significant   regurgitation noted. No significant stenosis. Mean gradient (S):   10 mm Hg. - Mitral valve: Moderately calcified annulus. Mildly calcified   leaflets . - Left atrium: The atrium was  mildly dilated. - Right ventricle: The cavity size was normal. Systolic function   was normal. - Pulmonary arteries: No complete TR doppler jet so unable to   estimate PA systolic pressure. - Inferior vena cava: The vessel was normal in size. The   respirophasic diameter changes were in the normal range (>= 50%),   consistent with normal central venous pressure. - Pericardium, extracardiac: A trivial pericardial effusion was   identified posterior to the heart.   3. Transcatheter Heart Valve Replacement 07/18/2015 Pre-operative Echo Findings: ?  severe aortic stenosis ?  normal left ventricular systolic function   Post-operative Echo Findings: ? no paravalvular leak ? normal left ventricular systolic function  An Edwards Sapien 3 transcatheter heart valve (size 29 mm, model #9600TFX, serial [id number])    Lab Results  Basic Metabolic Panel:  Recent Labs Lab 09/29/15 1133  NA 138  K 3.8  CL 99*  CO2 32  GLUCOSE 111*  BUN 19  CREATININE 1.16  CALCIUM 8.8*    Liver Function Tests:  Recent Labs Lab 09/29/15 1133  AST 18  ALT 14*  ALKPHOS 73  BILITOT 0.6  PROT 7.3  ALBUMIN 3.7    CBC:  Recent Labs Lab 09/29/15 1133  WBC 9.7  NEUTROABS 7.0  HGB 12.9*  HCT 38.1*  MCV 91.4  PLT 261  Cardiac Enzymes:  Recent Labs Lab 09/29/15 1133  TROPONINI <0.03    Radiology: Dg Chest 2 View  Result Date: 09/29/2015 CLINICAL DATA:  Aortic stenosis. Atrial fibrillation. Shortness of breath. EXAM: CHEST  2 VIEW COMPARISON:  Jul 09, 2015 FINDINGS: There is no edema or consolidation. Heart is borderline prominent with pulmonary vascularity within normal limits. No adenopathy. There is degenerative change in thoracic spine. There is a prosthetic aortic valve. There is calcification in the aortic arch region. IMPRESSION: Heart borderline prominent with prosthetic aortic valve in place. No edema or consolidation. Aortic atherosclerosis. Electronically Signed   By:  Lowella Grip III M.D.   On: 09/29/2015 12:43    ECG: SR, LBBB with PAC's   Impression and Recommendations  1.Dyspnea: Likely related to COPD exacerbation. The patient had significant improvement in breathing status after doing that treatments and IV Solu-Medrol. He continues wheezing that his breathing is less labored. He does not need to be admitted from a cardiology standpoint concerning his dyspnea. He may need to be admitted to stabilize his pulmonary status but will refer to ER and hospital is to make that decision.   He is on carvedilol 9.375 mg twice a day. As a non-cardioselective beta blocker this may exacerbate wheezing. Would consider changing to metoprolol 50 mg twice a day. Most recent troponin 0.06 likely demand ischemia.  2. Aortic valve stenosis: Status post TAVR on 07/18/2015. No paravalvular leak per echo in June 2017. No murmur heard on auscultation. Warfarin is managed by [facility]. INR in ERs 3.0.  3. Hypertension: BP is stable.  4. COPD: Likely recurrence of exacerbation. He was responding well to his nebulizer treatments and IV steroids. May need referral to pulmonologist due to recurrent exacerbations. We'll defer to PCP.       Signed: Phill Myron. Lawrence NP [facility]  09/29/2015, 4:13 PM Co-Sign MD  The patient was seen and examined, and I agree with the history, physical exam, assessment and plan as documented above which has been discussed with Arnold Long, with modifications as noted below. Asked by ED MD to evaluate patient for exertional dyspnea. Physical exam notable for prolonged expiratory phase with diffuse wheezing. CT angiogram negative for PE. Constellation of findings consistent with COPD exacerbation. Prosthetic AV intact by most recent echocardiogram, with no need to repeat at this time. Needs steroids and antibiotics.  Kate Sable, MD, [facility]  09/29/2015 5:56 PM

## 2015-09-29 NOTE — ED Triage Notes (Signed)
Pt c/o SOB X3 days with productive cough of clear to green sputum. Denies CP, dizziness. Reports having heart valve replaced in beginning of May.

## 2015-09-29 NOTE — ED Notes (Signed)
MD at bedside. 

## 2015-09-29 NOTE — ED Notes (Signed)
Attempted to call report to [phone], was told the bed has not been approved yet

## 2015-09-29 NOTE — ED Notes (Signed)
Attempted to call report again.  Dept has nurse coming in and will call me back when they are available to take report.

## 2015-09-29 NOTE — ED Notes (Signed)
Pt had open heart surgery May 2nd at [facility].  Started with SOB 2 days again.

## 2015-09-29 NOTE — ED Notes (Signed)
CRITICAL VALUE ALERT  Critical value received:  Troponin 0.06  Date of notification:  09/29/15  Time of notification:  1633  Critical value read back:Yes.    Nurse who received alert:  Allegra Lai, RN  MD notified (1st page): Brent Bulla, NP  Time of first page:  [phone]

## 2015-09-29 NOTE — ED Provider Notes (Signed)
Care assumed from Dr. Roderic Palau.  Patient with COPD exacerbation, recent aortic valve replacement, INR 3.  CTPE pending and cardiology consult pending.  Patient feels much improved. He has scattered wheezing receive additional nebulizer. He is ambulatory without desaturation. Cardiology has seen patient and feels his minimal troponin elevation is secondary to demand. INR is therapeutic. No complications with valve.  CT negative for PE. Does show lung nodule which patient is informed of. Patient is ambulatory without desaturation. He wishes to go home. Observation admission offered. He states he is breathing much better than when he came in.  Patient now wishes to be admitted. He states his breathing feels worse. Will give you given additional nebulizer. Oxygenation is 88% on room air. He is in no distress. Addition nebulizer given. Admission d/w Dr. Maudie Mercury.   Ezequiel Essex, MD 09/30/15 [phone]

## 2015-09-29 NOTE — ED Notes (Signed)
Ryan Shea Daughter [phone]

## 2015-09-29 NOTE — Discharge Instructions (Signed)
Use your inhaler as needed and follow up with your md this week for recheck

## 2015-09-30 ENCOUNTER — Observation Stay (HOSPITAL_BASED_OUTPATIENT_CLINIC_OR_DEPARTMENT_OTHER): Payer: Medicare PPO

## 2015-09-30 ENCOUNTER — Encounter (HOSPITAL_COMMUNITY): Payer: Self-pay | Admitting: *Deleted

## 2015-09-30 DIAGNOSIS — K219 Gastro-esophageal reflux disease without esophagitis: Secondary | ICD-10-CM | POA: Diagnosis present

## 2015-09-30 DIAGNOSIS — R0600 Dyspnea, unspecified: Secondary | ICD-10-CM | POA: Diagnosis not present

## 2015-09-30 DIAGNOSIS — Z7982 Long term (current) use of aspirin: Secondary | ICD-10-CM | POA: Diagnosis not present

## 2015-09-30 DIAGNOSIS — Z952 Presence of prosthetic heart valve: Secondary | ICD-10-CM | POA: Diagnosis not present

## 2015-09-30 DIAGNOSIS — Z8042 Family history of malignant neoplasm of prostate: Secondary | ICD-10-CM | POA: Diagnosis not present

## 2015-09-30 DIAGNOSIS — Z6838 Body mass index (BMI) 38.0-38.9, adult: Secondary | ICD-10-CM | POA: Diagnosis not present

## 2015-09-30 DIAGNOSIS — I252 Old myocardial infarction: Secondary | ICD-10-CM | POA: Diagnosis not present

## 2015-09-30 DIAGNOSIS — R7989 Other specified abnormal findings of blood chemistry: Secondary | ICD-10-CM | POA: Diagnosis not present

## 2015-09-30 DIAGNOSIS — E669 Obesity, unspecified: Secondary | ICD-10-CM | POA: Diagnosis present

## 2015-09-30 DIAGNOSIS — Z7901 Long term (current) use of anticoagulants: Secondary | ICD-10-CM | POA: Diagnosis not present

## 2015-09-30 DIAGNOSIS — I4892 Unspecified atrial flutter: Secondary | ICD-10-CM | POA: Diagnosis present

## 2015-09-30 DIAGNOSIS — J441 Chronic obstructive pulmonary disease with (acute) exacerbation: Secondary | ICD-10-CM | POA: Diagnosis present

## 2015-09-30 DIAGNOSIS — I5032 Chronic diastolic (congestive) heart failure: Secondary | ICD-10-CM | POA: Diagnosis present

## 2015-09-30 DIAGNOSIS — Z87891 Personal history of nicotine dependence: Secondary | ICD-10-CM | POA: Diagnosis not present

## 2015-09-30 DIAGNOSIS — Z823 Family history of stroke: Secondary | ICD-10-CM | POA: Diagnosis not present

## 2015-09-30 DIAGNOSIS — I4891 Unspecified atrial fibrillation: Secondary | ICD-10-CM | POA: Diagnosis present

## 2015-09-30 DIAGNOSIS — Z8711 Personal history of peptic ulcer disease: Secondary | ICD-10-CM | POA: Diagnosis not present

## 2015-09-30 DIAGNOSIS — I429 Cardiomyopathy, unspecified: Secondary | ICD-10-CM | POA: Diagnosis present

## 2015-09-30 DIAGNOSIS — Z7951 Long term (current) use of inhaled steroids: Secondary | ICD-10-CM | POA: Diagnosis not present

## 2015-09-30 DIAGNOSIS — N183 Chronic kidney disease, stage 3 (moderate): Secondary | ICD-10-CM | POA: Diagnosis present

## 2015-09-30 DIAGNOSIS — I130 Hypertensive heart and chronic kidney disease with heart failure and stage 1 through stage 4 chronic kidney disease, or unspecified chronic kidney disease: Secondary | ICD-10-CM | POA: Diagnosis present

## 2015-09-30 LAB — ECHOCARDIOGRAM COMPLETE
AV Mean grad: 8 mmHg
AV Peak grad: 13 mmHg
AV VEL mean LVOT/AV: 0.72
AV area mean vel ind: 0.79 cm2/m2
AV peak Index: 0.82
AV pk vel: 178 cm/s
AV vel: 1.91
AVAREAMEANV: 1.84 cm2
AVAREAVTI: 1.9 cm2
AVAREAVTIIND: 0.82 cm2/m2
Ao pk vel: 0.75 m/s
CHL CUP AV VALUE AREA INDEX: 0.82
DOP CAL AO MEAN VELOCITY: 128 cm/s
EWDT: 165 ms
FS: 34 % (ref 28–44)
Height: 66 in
IVS/LV PW RATIO, ED: 1.11
LA ID, A-P, ES: 37 mm
LA diam index: 1.59 cm/m2
LA vol A4C: 76.5 ml
LA vol index: 27.8 mL/m2
LA vol: 64.5 mL
LDCA: 2.54 cm2
LEFT ATRIUM END SYS DIAM: 37 mm
LV SIMPSON'S DISK: 62
LV dias vol index: 40 mL/m2
LVDIAVOL: 93 mL (ref 62–150)
LVOT SV: 69 mL
LVOT VTI: 27 cm
LVOT peak grad rest: 7 mmHg
LVOTD: 18 mm
LVOTPV: 133 cm/s
LVOTVTI: 0.75 cm
LVSYSVOL: 35 mL (ref 21–61)
LVSYSVOLIN: 15 mL/m2
MV Dec: 165
MV Peak grad: 14 mmHg
MVPKEVEL: 189 m/s
PW: 14.5 mm — AB (ref 0.6–1.1)
Stroke v: 58 ml
TAPSE: 18.4 mm
VTI: 36 cm
Valve area: 1.91 cm2
Weight: 3905.6 oz

## 2015-09-30 LAB — COMPREHENSIVE METABOLIC PANEL
ALK PHOS: 68 U/L (ref 38–126)
ALT: 16 U/L — ABNORMAL LOW (ref 17–63)
ANION GAP: 7 (ref 5–15)
AST: 20 U/L (ref 15–41)
Albumin: 3.4 g/dL — ABNORMAL LOW (ref 3.5–5.0)
BUN: 27 mg/dL — ABNORMAL HIGH (ref 6–20)
CALCIUM: 9.1 mg/dL (ref 8.9–10.3)
CO2: 30 mmol/L (ref 22–32)
Chloride: 99 mmol/L — ABNORMAL LOW (ref 101–111)
Creatinine, Ser: 1.23 mg/dL (ref 0.61–1.24)
GFR calc non Af Amer: 57 mL/min — ABNORMAL LOW (ref >60)
Glucose, Bld: 149 mg/dL — ABNORMAL HIGH (ref 65–99)
Potassium: 4.1 mmol/L (ref 3.5–5.1)
SODIUM: 136 mmol/L (ref 135–145)
TOTAL PROTEIN: 7 g/dL (ref 6.5–8.1)
Total Bilirubin: 0.5 mg/dL (ref 0.3–1.2)

## 2015-09-30 LAB — CBC
HCT: 37.2 % — ABNORMAL LOW (ref 39.0–52.0)
HEMOGLOBIN: 12.5 g/dL — AB (ref 13.0–17.0)
MCH: 30.4 pg (ref 26.0–34.0)
MCHC: 33.6 g/dL (ref 30.0–36.0)
MCV: 90.5 fL (ref 78.0–100.0)
Platelets: 242 10*3/uL (ref 150–400)
RBC: 4.11 MIL/uL — ABNORMAL LOW (ref 4.22–5.81)
RDW: 14.6 % (ref 11.5–15.5)
WBC: 11.5 10*3/uL — ABNORMAL HIGH (ref 4.0–10.5)

## 2015-09-30 LAB — TROPONIN I: Troponin I: <0.03 ng/mL (ref <0.03)

## 2015-09-30 MED ORDER — SODIUM CHLORIDE 0.9% FLUSH
3.0000 mL | INTRAVENOUS | Status: DC | PRN
  Administered 2015-09-30: 3 mL via INTRAVENOUS
  Filled 2015-09-30: qty 3

## 2015-09-30 MED ORDER — POLYSACCHARIDE IRON COMPLEX 150 MG PO CAPS
ORAL_CAPSULE | Freq: Two times a day (BID) | ORAL | Status: DC
  Administered 2015-09-30 – 2015-10-01 (×3): 150 mg via ORAL
  Filled 2015-09-30 (×3): qty 1

## 2015-09-30 MED ORDER — ALBUTEROL SULFATE (2.5 MG/3ML) 0.083% IN NEBU: 2.5000 mg | INHALATION_SOLUTION | RESPIRATORY_TRACT | Status: DC | PRN

## 2015-09-30 MED ORDER — ALBUTEROL SULFATE (2.5 MG/3ML) 0.083% IN NEBU
2.5000 mg | INHALATION_SOLUTION | Freq: Three times a day (TID) | RESPIRATORY_TRACT | Status: DC
  Administered 2015-10-01 (×2): 2.5 mg via RESPIRATORY_TRACT
  Filled 2015-09-30 (×2): qty 3

## 2015-09-30 MED ORDER — SODIUM CHLORIDE 0.9 % IV SOLN: 250.0000 mL | INTRAVENOUS | Status: DC | PRN

## 2015-09-30 MED ORDER — SODIUM CHLORIDE 0.9% FLUSH
3.0000 mL | Freq: Two times a day (BID) | INTRAVENOUS | Status: DC
  Administered 2015-09-30 – 2015-10-01 (×3): 3 mL via INTRAVENOUS

## 2015-09-30 MED ORDER — LEVOFLOXACIN IN D5W 500 MG/100ML IV SOLN
500.0000 mg | INTRAVENOUS | Status: DC
  Administered 2015-09-30 – 2015-10-01 (×2): 500 mg via INTRAVENOUS
  Filled 2015-09-30 (×2): qty 100

## 2015-09-30 MED ORDER — PANTOPRAZOLE SODIUM 40 MG PO TBEC
40.0000 mg | DELAYED_RELEASE_TABLET | Freq: Every day | ORAL | Status: DC
  Administered 2015-09-30 – 2015-10-01 (×2): 40 mg via ORAL
  Filled 2015-09-30 (×2): qty 1

## 2015-09-30 MED ORDER — ALBUTEROL SULFATE (2.5 MG/3ML) 0.083% IN NEBU
2.5000 mg | INHALATION_SOLUTION | Freq: Four times a day (QID) | RESPIRATORY_TRACT | Status: DC
  Administered 2015-09-30 (×4): 2.5 mg via RESPIRATORY_TRACT
  Filled 2015-09-30 (×4): qty 3

## 2015-09-30 MED ORDER — METHYLPREDNISOLONE SODIUM SUCC 125 MG IJ SOLR
60.0000 mg | Freq: Two times a day (BID) | INTRAMUSCULAR | Status: DC
  Administered 2015-09-30 – 2015-10-01 (×2): 60 mg via INTRAVENOUS
  Filled 2015-09-30 (×2): qty 2

## 2015-09-30 MED ORDER — SODIUM CHLORIDE 0.9% FLUSH
3.0000 mL | Freq: Two times a day (BID) | INTRAVENOUS | Status: DC
  Administered 2015-09-30 (×2): 3 mL via INTRAVENOUS

## 2015-09-30 MED ORDER — FUROSEMIDE 40 MG PO TABS: 40.0000 mg | ORAL_TABLET | Freq: Every day | ORAL | Status: DC | PRN

## 2015-09-30 MED ORDER — ASPIRIN 81 MG PO CHEW
81.0000 mg | CHEWABLE_TABLET | Freq: Every day | ORAL | Status: DC
  Administered 2015-09-30 – 2015-10-01 (×2): 81 mg via ORAL
  Filled 2015-09-30 (×2): qty 1

## 2015-09-30 MED ORDER — ALBUTEROL SULFATE HFA 108 (90 BASE) MCG/ACT IN AERS: 1.0000 | INHALATION_SPRAY | Freq: Four times a day (QID) | RESPIRATORY_TRACT | Status: DC | PRN

## 2015-09-30 MED ORDER — WARFARIN SODIUM 5 MG PO TABS
2.5000 mg | ORAL_TABLET | Freq: Once | ORAL | Status: AC
  Administered 2015-09-30: 2.5 mg via ORAL
  Filled 2015-09-30: qty 1

## 2015-09-30 MED ORDER — METHYLPREDNISOLONE SODIUM SUCC 125 MG IJ SOLR
80.0000 mg | Freq: Three times a day (TID) | INTRAMUSCULAR | Status: DC
  Administered 2015-09-30 (×2): 80 mg via INTRAVENOUS
  Filled 2015-09-30 (×2): qty 2

## 2015-09-30 MED ORDER — ALLOPURINOL 100 MG PO TABS
100.0000 mg | ORAL_TABLET | Freq: Every day | ORAL | Status: DC
  Administered 2015-09-30 – 2015-10-01 (×2): 100 mg via ORAL
  Filled 2015-09-30 (×2): qty 1

## 2015-09-30 MED ORDER — CARVEDILOL 3.125 MG PO TABS
9.3750 mg | ORAL_TABLET | Freq: Two times a day (BID) | ORAL | Status: DC
  Administered 2015-09-30 – 2015-10-01 (×4): 9.375 mg via ORAL
  Filled 2015-09-30 (×4): qty 3

## 2015-09-30 MED ORDER — WARFARIN - PHARMACIST DOSING INPATIENT
Status: DC
  Administered 2015-09-30: 1
  Administered 2015-10-01: 17:00:00

## 2015-09-30 MED ORDER — ACETAMINOPHEN 325 MG PO TABS: 650.0000 mg | ORAL_TABLET | Freq: Four times a day (QID) | ORAL | Status: DC | PRN

## 2015-09-30 MED ORDER — UMECLIDINIUM BROMIDE 62.5 MCG/INH IN AEPB
1.0000 | INHALATION_SPRAY | Freq: Every day | RESPIRATORY_TRACT | Status: DC
  Administered 2015-09-30 – 2015-10-01 (×2): 1 via RESPIRATORY_TRACT
  Filled 2015-09-30: qty 7

## 2015-09-30 MED ORDER — PRAVASTATIN SODIUM 40 MG PO TABS
40.0000 mg | ORAL_TABLET | Freq: Every day | ORAL | Status: DC
  Administered 2015-09-30 (×2): 40 mg via ORAL
  Filled 2015-09-30 (×2): qty 1

## 2015-09-30 MED ORDER — ACETAMINOPHEN 650 MG RE SUPP: 650.0000 mg | Freq: Four times a day (QID) | RECTAL | Status: DC | PRN

## 2015-09-30 MED ORDER — GABAPENTIN 300 MG PO CAPS: 300.0000 mg | ORAL_CAPSULE | Freq: Three times a day (TID) | ORAL | Status: DC | PRN

## 2015-09-30 MED ORDER — ZOLPIDEM TARTRATE 5 MG PO TABS
5.0000 mg | ORAL_TABLET | Freq: Every evening | ORAL | Status: DC | PRN
  Administered 2015-09-30 (×2): 5 mg via ORAL
  Filled 2015-09-30 (×2): qty 1

## 2015-09-30 MED ORDER — MOMETASONE FURO-FORMOTEROL FUM 200-5 MCG/ACT IN AERO
2.0000 | INHALATION_SPRAY | Freq: Two times a day (BID) | RESPIRATORY_TRACT | Status: DC
  Administered 2015-09-30 – 2015-10-01 (×3): 2 via RESPIRATORY_TRACT
  Filled 2015-09-30: qty 8.8

## 2015-09-30 NOTE — Progress Notes (Signed)
*  PRELIMINARY RESULTS* Echocardiogram 2D Echocardiogram has been performed.  Ryan Shea 09/30/2015, 2:18 PM

## 2015-09-30 NOTE — Progress Notes (Signed)
ANTICOAGULATION CONSULT NOTE - Initial Consult  Pharmacy Consult for Coumadin (chronic Rx PTA) Indication: atrial fibrillation  Allergies  Allergen Reactions  . Gabapentin Other (See Comments)    Capsule causes bad heartburn   Patient Measurements: Height: 5\' 6"  (167.6 cm) Weight: 244 lb 1.6 oz (110.7 kg) IBW/kg (Calculated) : 63.8  Vital Signs: Temp: 98.1 F (36.7 C) (07/25 0630) Temp Source: Oral (07/25 0630) BP: 132/81 (07/25 0630) Pulse Rate: 87 (07/25 0630)  Labs:  Recent Labs  09/29/15 1133 09/29/15 1522 09/30/15 0030 09/30/15 0541 09/30/15 0545  HGB 12.9*  --   --  12.5*  --   HCT 38.1*  --   --  37.2*  --   PLT 261  --   --  242  --   LABPROT 30.6*  --   --   --   --   INR 3.00*  --   --   --   --   CREATININE 1.16  --   --  1.23  --   TROPONINI <0.03 0.06* <0.03  --  <0.03   Estimated Creatinine Clearance: 64.4 mL/min (by C-G formula based on SCr of 1.23 mg/dL).  Medical History: Past Medical History:  Diagnosis Date  . Aortic valve stenosis   . Asthma   . Atrial flutter ([place])   . Cancer [facility])    Prostate   . Cardiomyopathy ([place])   . COPD (chronic obstructive pulmonary disease) ([place])   . Gout   . Helicobacter pylori gastritis MAR 2016  . Hypertension   . Mitral regurgitation   . Multiple duodenal ulcers MAR 2016    Medications:  Prescriptions Prior to Admission  Medication Sig Dispense Refill Last Dose  . allopurinol (ZYLOPRIM) 100 MG tablet Take 100 mg by mouth daily.   09/29/2015 at Unknown time  . aspirin 81 MG chewable tablet Chew 1 tablet (81 mg total) by mouth daily. 30 tablet 0 09/29/2015 at Unknown time  . carvedilol (COREG) 6.25 MG tablet Take 1.5 tablets (9.375 mg total) by mouth 2 (two) times daily. 180 tablet 3 09/29/2015 at 800  . Fluticasone-Salmeterol (ADVAIR DISKUS) 250-50 MCG/DOSE AEPB Inhale 1 puff into the lungs daily. 60 each 1 09/29/2015 at Unknown time  . furosemide (LASIX) 40 MG tablet Take 40 mg by mouth daily as needed for  edema.   09/29/2015 at Unknown time  . gabapentin (NEURONTIN) 600 MG tablet Take 300 mg by mouth 3 (three) times daily as needed (for pain/neuropathy).    Past Week at Unknown time  . Iron Polysacch Cmplx-B12-FA 150-0.025-1 MG CAPS Take 1 capsule by mouth 2 (two) times daily. 60 each 0 09/29/2015 at Unknown time  . pantoprazole (PROTONIX) 40 MG tablet Take 1 tablet (40 mg total) by mouth daily. 60 tablet 5 09/29/2015 at Unknown time  . pravastatin (PRAVACHOL) 40 MG tablet Take 1 tablet by mouth at bedtime.    09/28/2015 at Unknown time  . warfarin (COUMADIN) 5 MG tablet Take 5 mg by mouth daily.   09/28/2015 at 2000  . zolpidem (AMBIEN) 10 MG tablet Take 0.5 tablets (5 mg total) by mouth at bedtime as needed for sleep. 30 tablet 0 09/28/2015 at Unknown time  . albuterol (PROVENTIL) (2.5 MG/3ML) 0.083% nebulizer solution Take 3 mLs (2.5 mg total) by nebulization every 4 (four) hours as needed for wheezing or shortness of breath. 75 mL 12 unknown  . colchicine 0.6 MG tablet Take 1 tablet (0.6 mg total) by mouth daily. (Patient not taking: Reported  on 09/29/2015) 20 tablet 0 Not Taking at Unknown time  . VENTOLIN HFA 108 (90 BASE) MCG/ACT inhaler Inhale 1 puff into the lungs every 6 (six) hours as needed for wheezing or shortness of breath. Reported on 07/02/2015   unknown  . warfarin (COUMADIN) 1 MG tablet Take 3.5 tablets (3.5 mg total) by mouth daily at 6 PM. (Patient not taking: Reported on 09/29/2015) 60 tablet 0 Not Taking at Unknown time    Assessment: 70yo male on chronic Coumadin PTA.  INR at upper end of desired range (3.0).  No bleeding reported.  CBC appears stable.  Pt on multiple meds that can potentially interact with Coumadin.    Goal of Therapy:  INR 2-3 Monitor platelets by anticoagulation protocol: Yes   Plan:  Coumadin 2.5mg  today x 1 (to discourage overshoot of INR) Check INR daily, monitor for s/sx of bleeding complications  Hart Robinsons A 09/30/2015,11:39 AM

## 2015-09-30 NOTE — Progress Notes (Signed)
PROGRESS NOTE    Ryan Shea  [id number] DOB: 10-06-1945 DOA: 09/29/2015 PCP: [facility]    Brief Narrative:  70 y.o. male, w Copd, not on home o2, CAD, TAVR, apparently c/o dyspnea x2 days.  Denies fever, chills, cough, cp, palp, orthopnea, pnd, weight gain.   Assessment & Plan:   Principal Problem:   COPD exacerbation ([place]) Active Problems:   Paroxysmal atrial flutter ([facility])   Elevated troponin    1. Copd exacerbation -Patient continued on Solumedrol 80mg  iv q8h -On empiric levaquin 500mg  iv qday -Continue spiriva 1puff qday -Continue Albuterol q6h and q6h prn -Trace wheezing, although decreased BS on exam. Will begin weaning steroids  2. Dyspnea -Check cardiac echo  3. Trop elevation -Trop serially neg  4. Aflutter -Cont Coumadin pharmacy to dose.  DVT prophylaxis: Coumadin Code Status: Full Family Communication: Pt in room Disposition Plan: Anticipate d/c home 24-48hrs  Consultants:     Procedures:     Antimicrobials: Anti-infectives    Start     Dose/Rate Route Frequency Ordered Stop   09/30/15 0010  levofloxacin (LEVAQUIN) IVPB 500 mg     500 mg 100 mL/hr over 60 Minutes Intravenous Every 24 hours 09/30/15 0010         Subjective: Feels better, however still mildly sob this AM  Objective: Vitals:   09/30/15 0630 09/30/15 0833 09/30/15 1025 09/30/15 1028  BP: 132/81     Pulse: 87     Resp: 18     Temp: 98.1 F (36.7 C)     TempSrc: Oral     SpO2: 94% 93% 92% 93%  Weight:      Height:        Intake/Output Summary (Last 24 hours) at 09/30/15 1234 Last data filed at 09/30/15 0800  Gross per 24 hour  Intake              240 ml  Output              650 ml  Net             -410 ml   Filed Weights   09/29/15 1058 09/30/15 0018  Weight: 108.9 kg (240 lb) 110.7 kg (244 lb 1.6 oz)    Examination:  General exam: Appears calm and comfortable  Respiratory system: Decreased BS, end-expiratory  wheezing B Cardiovascular system: S1 & S2 heard, RRR.  Gastrointestinal system: Abdomen is nondistended, soft and nontender. No organomegaly or masses felt. Normal bowel sounds heard. Central nervous system: Alert and oriented. No focal neurological deficits. Extremities: Symmetric 5 x 5 power. Skin: No rashes, lesions  Psychiatry: Judgement and insight appear normal. Mood & affect appropriate.   Data Reviewed: I have personally reviewed following labs and imaging studies  CBC:  Recent Labs Lab 09/29/15 1133 09/30/15 0541  WBC 9.7 11.5*  NEUTROABS 7.0  --   HGB 12.9* 12.5*  HCT 38.1* 37.2*  MCV 91.4 90.5  PLT 261 [id number]   Basic Metabolic Panel:  Recent Labs Lab 09/29/15 1133 09/30/15 0541  NA 138 136  K 3.8 4.1  CL 99* 99*  CO2 32 30  GLUCOSE 111* 149*  BUN 19 27*  CREATININE 1.16 1.23  CALCIUM 8.8* 9.1   GFR: Estimated Creatinine Clearance: 64.4 mL/min (by C-G formula based on SCr of 1.23 mg/dL). Liver Function Tests:  Recent Labs Lab 09/29/15 1133 09/30/15 0541  AST 18 20  ALT 14* 16*  ALKPHOS 73 68  BILITOT 0.6  0.5  PROT 7.3 7.0  ALBUMIN 3.7 3.4*   No results for input(s): LIPASE, AMYLASE in the last 168 hours. No results for input(s): AMMONIA in the last 168 hours. Coagulation Profile:  Recent Labs Lab 09/29/15 1133  INR 3.00*   Cardiac Enzymes:  Recent Labs Lab 09/29/15 1133 09/29/15 1522 09/30/15 0030 09/30/15 0545 09/30/15 1139  TROPONINI <0.03 0.06* <0.03 <0.03 <0.03   BNP (last 3 results) No results for input(s): PROBNP in the last 8760 hours. HbA1C: No results for input(s): HGBA1C in the last 72 hours. CBG: No results for input(s): GLUCAP in the last 168 hours. Lipid Profile: No results for input(s): CHOL, HDL, LDLCALC, TRIG, CHOLHDL, LDLDIRECT in the last 72 hours. Thyroid Function Tests: No results for input(s): TSH, T4TOTAL, FREET4, T3FREE, THYROIDAB in the last 72 hours. Anemia Panel: No results for input(s): VITAMINB12,  FOLATE, FERRITIN, TIBC, IRON, RETICCTPCT in the last 72 hours. Sepsis Labs: No results for input(s): PROCALCITON, LATICACIDVEN in the last 168 hours.  No results found for this or any previous visit (from the past 240 hour(s)).   Radiology Studies: Dg Chest 2 View  Result Date: 09/29/2015 CLINICAL DATA:  Aortic stenosis. Atrial fibrillation. Shortness of breath. EXAM: CHEST  2 VIEW COMPARISON:  Jul 09, 2015 FINDINGS: There is no edema or consolidation. Heart is borderline prominent with pulmonary vascularity within normal limits. No adenopathy. There is degenerative change in thoracic spine. There is a prosthetic aortic valve. There is calcification in the aortic arch region. IMPRESSION: Heart borderline prominent with prosthetic aortic valve in place. No edema or consolidation. Aortic atherosclerosis. Electronically Signed   By: Lowella Grip III M.D.   On: 09/29/2015 12:43  Ct Angio Chest Pe W/cm &/or Wo Cm  Result Date: 09/29/2015 CLINICAL DATA:  Shortness of breath for 3 days. Productive cough. Recent history of heart valve replacement May 2017. EXAM: CT ANGIOGRAPHY CHEST WITH CONTRAST TECHNIQUE: Multidetector CT imaging of the chest was performed using the standard protocol during bolus administration of intravenous contrast. Multiplanar CT image reconstructions and MIPs were obtained to evaluate the vascular anatomy. CONTRAST:  100 mL Isovue 370 COMPARISON:  CTA chest 06/11/2015 associated with the previous valve replacement FINDINGS: Cardiovascular: Endovascular aortic valve replacement is in place. The aorta demonstrates minimal atherosclerotic calcification. There is no aneurysm. There is no significant stenosis of the great vessel origins. Mediastinum/Nodes: Pulmonary arterial opacification is satisfactory. There are no focal filling defects to suggest pulmonary emboli. No significant mediastinal or axillary adenopathy is present. Lungs/Pleura: The lung windows are mildly degraded by  patient motion. Small nodules in the left lower lobe medially are stable. No other focal nodule mass, or airspace disease is present. Upper Abdomen: Scattered calcifications are present in the liver. Gallbladder is within normal limits. Exophytic cyst at the upper pole of the left kidney is stable. Musculoskeletal: Bone windows demonstrate fusion of anterior osteophytes across the lower thoracic spine. No focal lytic or blastic lesions are present. The ribs are intact. The scapula is normal bilaterally. Review of the MIP images confirms the above findings. IMPRESSION: 1. No evidence for pulmonary embolus. 2. Stable small nodules in the medial left lower lobe. These nodules measure less than 5 mm. No follow-up needed if patient is low-risk (and has no known or suspected primary neoplasm). Non-contrast chest CT can be considered in 12 months if patient is high-risk. This recommendation follows the consensus statement: Guidelines for Management of Incidental Pulmonary Nodules Detected on CT Images:From the Fleischner Society 2017; published online  before print (10.1148/radiol.IJ:2314499). 3. Status post endovascular aortic valve replacement. Electronically Signed   By: San Morelle M.D.   On: 09/29/2015 17:15   Scheduled Meds: . albuterol  2.5 mg Nebulization Q6H  . allopurinol  100 mg Oral Daily  . aspirin  81 mg Oral Daily  . carvedilol  9.375 mg Oral BID  . iron polysaccharides   Oral BID  . levofloxacin (LEVAQUIN) IV  500 mg Intravenous Q24H  . methylPREDNISolone (SOLU-MEDROL) injection  80 mg Intravenous Q8H  . mometasone-formoterol  2 puff Inhalation BID  . pantoprazole  40 mg Oral Daily  . pravastatin  40 mg Oral QHS  . sodium chloride flush  3 mL Intravenous Q12H  . sodium chloride flush  3 mL Intravenous Q12H  . umeclidinium bromide  1 puff Inhalation Daily  . warfarin  2.5 mg Oral Once  . Warfarin - Pharmacist Dosing Inpatient   Does not apply Q24H   Continuous Infusions:    LOS:  0 days   CHIU, Orpah Melter, MD [facility] Pager [phone]  If 7PM-7AM, please contact night-coverage [email or web address] Password [facility] 09/30/2015, 12:34 PM

## 2015-09-30 NOTE — Care Management Obs Status (Signed)
[place] NOTIFICATION   Patient Details  Name: Ryan Shea MRN: [id number] Date of Birth: Oct 06, 1945   Medicare Observation Status Notification Given:  Yes    Sherald Barge, RN 09/30/2015, 3:23 PM

## 2015-09-30 NOTE — Care Management Note (Signed)
Case Management Note  Patient Details  Name: Ryan Shea MRN: [id number] Date of Birth: 04/27/1945  Subjective/Objective:                  Pt admitted with COPD exacerbation. Pt is ind with ADL's. Lives with his wife, has PCP and drives himself to appointments. Pt plans to return home with self care. Pt has neb machine but no home O2. Pt will need home o2 assessment prior to discharge.  Action/Plan: No CM needs anticipated, will cont to follow.   Expected Discharge Date:    10/01/2015              Expected Discharge Plan:  Home/Self Care  In-House Referral:  NA  Discharge planning Services  CM Consult  Post Acute Care Choice:    Choice offered to:     DME Arranged:    DME Agency:     HH Arranged:    HH Agency:     Status of Service:  In process, will continue to follow  If discussed at Long Length of Stay Meetings, dates discussed:    Additional Comments:  Sherald Barge, RN 09/30/2015, 3:24 PM

## 2015-10-01 LAB — PROTIME-INR
INR: 3.08
Prothrombin Time: 31.2 seconds — ABNORMAL HIGH (ref 11.4–15.2)

## 2015-10-01 MED ORDER — LEVOFLOXACIN 750 MG PO TABS: 750.0000 mg | ORAL_TABLET | Freq: Every day | ORAL | 0 refills | Status: DC

## 2015-10-01 MED ORDER — WARFARIN SODIUM 1 MG PO TABS
1.0000 mg | ORAL_TABLET | Freq: Once | ORAL | Status: AC
  Administered 2015-10-01: 1 mg via ORAL
  Filled 2015-10-01: qty 1

## 2015-10-01 MED ORDER — PREDNISONE 10 MG PO TABS: 10.0000 mg | ORAL_TABLET | Freq: Every day | ORAL | 0 refills | Status: DC

## 2015-10-01 NOTE — Care Management Important Message (Signed)
Important Message  Patient Details  Name: Ryan Shea MRN: [id number] Date of Birth: 04/14/1945   Medicare Important Message Given:  Yes    Sherald Barge, RN 10/01/2015, 10:31 AM

## 2015-10-01 NOTE — Progress Notes (Signed)
Discharge instructions and prescriptions given, verbalized understanding, out in stable condition via w/c with staff. 

## 2015-10-01 NOTE — Progress Notes (Signed)
SATURATION QUALIFICATIONS: (This note is used to comply with regulatory documentation for home oxygen)  Patient Saturations on Room Air at Rest = 94%  Patient Saturations on Room Air while Ambulating =92%  Patient Saturations on Room Air after ambulating = 92%.

## 2015-10-01 NOTE — Progress Notes (Signed)
ANTICOAGULATION CONSULT NOTE - Initial Consult  Pharmacy Consult for Coumadin (chronic Rx PTA) Indication: atrial fibrillation  Allergies  Allergen Reactions  . Gabapentin Other (See Comments)    Capsule causes bad heartburn   Patient Measurements: Height: 5\' 6"  (167.6 cm) Weight: 244 lb 1.1 oz (110.7 kg) IBW/kg (Calculated) : 63.8  Vital Signs: Temp: 98.2 F (36.8 C) (07/26 0649) Temp Source: Oral (07/26 0649) BP: 120/68 (07/26 0649) Pulse Rate: 62 (07/26 0649)  Labs:  Recent Labs  09/29/15 1133  09/30/15 0030 09/30/15 0541 09/30/15 0545 09/30/15 1139 10/01/15 0536  HGB 12.9*  --   --  12.5*  --   --   --   HCT 38.1*  --   --  37.2*  --   --   --   PLT 261  --   --  242  --   --   --   LABPROT 30.6*  --   --   --   --   --  31.2*  INR 3.00*  --   --   --   --   --  3.08  CREATININE 1.16  --   --  1.23  --   --   --   TROPONINI <0.03  < > <0.03  --  <0.03 <0.03  --   < > = values in this interval not displayed. Estimated Creatinine Clearance: 64.4 mL/min (by C-G formula based on SCr of 1.23 mg/dL).  Medical History: Past Medical History:  Diagnosis Date  . Aortic valve stenosis   . Asthma   . Atrial flutter ([place])   . Cancer [facility])    Prostate   . Cardiomyopathy ([place])   . COPD (chronic obstructive pulmonary disease) ([place])   . Gout   . Helicobacter pylori gastritis MAR 2016  . Hypertension   . Mitral regurgitation   . Multiple duodenal ulcers MAR 2016   Medications:  Prescriptions Prior to Admission  Medication Sig Dispense Refill Last Dose  . allopurinol (ZYLOPRIM) 100 MG tablet Take 100 mg by mouth daily.   09/29/2015 at Unknown time  . aspirin 81 MG chewable tablet Chew 1 tablet (81 mg total) by mouth daily. 30 tablet 0 09/29/2015 at Unknown time  . carvedilol (COREG) 6.25 MG tablet Take 1.5 tablets (9.375 mg total) by mouth 2 (two) times daily. 180 tablet 3 09/29/2015 at 800  . Fluticasone-Salmeterol (ADVAIR DISKUS) 250-50 MCG/DOSE AEPB Inhale 1 puff into  the lungs daily. 60 each 1 09/29/2015 at Unknown time  . furosemide (LASIX) 40 MG tablet Take 40 mg by mouth daily as needed for edema.   09/29/2015 at Unknown time  . gabapentin (NEURONTIN) 600 MG tablet Take 300 mg by mouth 3 (three) times daily as needed (for pain/neuropathy).    Past Week at Unknown time  . Iron Polysacch Cmplx-B12-FA 150-0.025-1 MG CAPS Take 1 capsule by mouth 2 (two) times daily. 60 each 0 09/29/2015 at Unknown time  . pantoprazole (PROTONIX) 40 MG tablet Take 1 tablet (40 mg total) by mouth daily. 60 tablet 5 09/29/2015 at Unknown time  . pravastatin (PRAVACHOL) 40 MG tablet Take 1 tablet by mouth at bedtime.    09/28/2015 at Unknown time  . warfarin (COUMADIN) 5 MG tablet Take 5 mg by mouth daily.   09/28/2015 at 2000  . zolpidem (AMBIEN) 10 MG tablet Take 0.5 tablets (5 mg total) by mouth at bedtime as needed for sleep. 30 tablet 0 09/28/2015 at Unknown time  . albuterol (PROVENTIL) (  2.5 MG/3ML) 0.083% nebulizer solution Take 3 mLs (2.5 mg total) by nebulization every 4 (four) hours as needed for wheezing or shortness of breath. 75 mL 12 unknown  . colchicine 0.6 MG tablet Take 1 tablet (0.6 mg total) by mouth daily. (Patient not taking: Reported on 09/29/2015) 20 tablet 0 Not Taking at Unknown time  . VENTOLIN HFA 108 (90 BASE) MCG/ACT inhaler Inhale 1 puff into the lungs every 6 (six) hours as needed for wheezing or shortness of breath. Reported on 07/02/2015   unknown  . warfarin (COUMADIN) 1 MG tablet Take 3.5 tablets (3.5 mg total) by mouth daily at 6 PM. (Patient not taking: Reported on 09/29/2015) 60 tablet 0 Not Taking at Unknown time   Assessment: 70yo male on chronic Coumadin PTA.  INR at upper end of desired range (3.0).  No bleeding reported.  CBC appears stable.  Pt on multiple meds that can potentially interact with Coumadin.    Goal of Therapy:  INR 2-3 Monitor platelets by anticoagulation protocol: Yes   Plan:  Coumadin 1mg  today x 1 (to discourage overshoot of  INR) Check INR daily, monitor for s/sx of bleeding complications  Hart Robinsons A 10/01/2015,11:02 AM

## 2015-10-01 NOTE — Discharge Summary (Signed)
Physician Discharge Summary  Ryan Shea [id number] DOB: 03-01-1946 DOA: 09/29/2015  PCP: [facility]  Admit date: 09/29/2015 Discharge date: 10/01/2015  Time spent: 45 minutes  Recommendations for Outpatient Follow-up:  Will be discharged home today Advised to follow-up with primary care provider in 2 weeks   Discharge Diagnoses:  Principal Problem:   COPD exacerbation [facility]) Active Problems:   Paroxysmal atrial flutter (HCC)   Elevated troponin   Discharge Condition: Stable and improved  Filed Weights   09/29/15 1058 09/30/15 0018 10/01/15 0649  Weight: 108.9 kg (240 lb) 110.7 kg (244 lb 1.6 oz) 110.7 kg (244 lb 1.1 oz)    History of present illness:  As per Dr. Maudie Shea on 7/24: Ryan Shea  is a 70 y.o. male, w Copd, not on home o2, CAD, TAVR, apparently c/o dyspnea x2 days.  Denies fever, chills, cough, cp, palp, orthopnea, pnd, weight gain. Slight lower ext edema.    Pt thinks that the humidity exacerbated his Copd, had to go out in the heat x2 over the past 2 days.  Dyspnea was worse this am, and therefore presented to ED.    In ED,  Pt had CTA chest due to + d dimer that was negative for PE.  Slight small nodules in the left medial lobe, repeat in 12 months if high risk.  Pt also had slight trop elevation.  Note had cardiac catheterization in 03/2015.  Pt will be admitted for presumed Copd exacerbation and mild trop elevation.   Hospital Course:   COPD with acute exacerbation -Much improved, no further wheezing, patient anxious for discharge home today. -We'll discharge on Levaquin for 3 more days, prednisone taper, when necessary albuterol.  Atrial flutter -Rate controlled, continue Coumadin, pharmacy to dose   Procedures:  None   Consultations:  None  Discharge Instructions  Discharge Instructions    Diet - low sodium heart healthy    Complete by:  As directed   Increase activity slowly    Complete by:  As directed         Medication List    STOP taking these medications   colchicine 0.6 MG tablet     TAKE these medications   allopurinol 100 MG tablet Commonly known as:  ZYLOPRIM Take 100 mg by mouth daily.   aspirin 81 MG chewable tablet Chew 1 tablet (81 mg total) by mouth daily.   carvedilol 6.25 MG tablet Commonly known as:  COREG Take 1.5 tablets (9.375 mg total) by mouth 2 (two) times daily.   Fluticasone-Salmeterol 250-50 MCG/DOSE Aepb Commonly known as:  ADVAIR DISKUS Inhale 1 puff into the lungs daily.   furosemide 40 MG tablet Commonly known as:  LASIX Take 40 mg by mouth daily as needed for edema.   gabapentin 600 MG tablet Commonly known as:  NEURONTIN Take 300 mg by mouth 3 (three) times daily as needed (for pain/neuropathy).   Iron Polysacch Cmplx-B12-FA 150-0.025-1 MG Caps Take 1 capsule by mouth 2 (two) times daily.   levofloxacin 750 MG tablet Commonly known as:  LEVAQUIN Take 1 tablet (750 mg total) by mouth daily.   pantoprazole 40 MG tablet Commonly known as:  PROTONIX Take 1 tablet (40 mg total) by mouth daily.   pravastatin 40 MG tablet Commonly known as:  PRAVACHOL Take 1 tablet by mouth at bedtime.   predniSONE 10 MG tablet Commonly known as:  DELTASONE Take 1 tablet (10 mg total) by mouth daily with breakfast.  Take 6 tablets today and then decrease by 1 tablet daily until none are left.   VENTOLIN HFA 108 (90 Base) MCG/ACT inhaler Generic drug:  albuterol Inhale 1 puff into the lungs every 6 (six) hours as needed for wheezing or shortness of breath. Reported on 07/02/2015   albuterol (2.5 MG/3ML) 0.083% nebulizer solution Commonly known as:  PROVENTIL Take 3 mLs (2.5 mg total) by nebulization every 4 (four) hours as needed for wheezing or shortness of breath.   warfarin 5 MG tablet Commonly known as:  COUMADIN Take 5 mg by mouth daily. What changed:  Another medication with the same name was removed. Continue taking this medication, and follow  the directions you see here.   zolpidem 10 MG tablet Commonly known as:  AMBIEN Take 0.5 tablets (5 mg total) by mouth at bedtime as needed for sleep.      Allergies  Allergen Reactions  . Gabapentin Other (See Comments)    Capsule causes bad heartburn   Follow-up [facility] Schedule an appointment as soon as possible for a visit in 3 week(s).   Contact information: [address] [place] [address] [phone]            The results of significant diagnostics from this hospitalization (including imaging, microbiology, ancillary and laboratory) are listed below for reference.    Significant Diagnostic Studies: Dg Chest 2 View  Result Date: 09/29/2015 CLINICAL DATA:  Aortic stenosis. Atrial fibrillation. Shortness of breath. EXAM: CHEST  2 VIEW COMPARISON:  Jul 09, 2015 FINDINGS: There is no edema or consolidation. Heart is borderline prominent with pulmonary vascularity within normal limits. No adenopathy. There is degenerative change in thoracic spine. There is a prosthetic aortic valve. There is calcification in the aortic arch region. IMPRESSION: Heart borderline prominent with prosthetic aortic valve in place. No edema or consolidation. Aortic atherosclerosis. Electronically Signed   By: Lowella Grip III M.D.   On: 09/29/2015 12:43  Ct Angio Chest Pe W/cm &/or Wo Cm  Result Date: 09/29/2015 CLINICAL DATA:  Shortness of breath for 3 days. Productive cough. Recent history of heart valve replacement May 2017. EXAM: CT ANGIOGRAPHY CHEST WITH CONTRAST TECHNIQUE: Multidetector CT imaging of the chest was performed using the standard protocol during bolus administration of intravenous contrast. Multiplanar CT image reconstructions and MIPs were obtained to evaluate the vascular anatomy. CONTRAST:  100 mL Isovue 370 COMPARISON:  CTA chest 06/11/2015 associated with the previous valve replacement FINDINGS: Cardiovascular: Endovascular  aortic valve replacement is in place. The aorta demonstrates minimal atherosclerotic calcification. There is no aneurysm. There is no significant stenosis of the great vessel origins. Mediastinum/Nodes: Pulmonary arterial opacification is satisfactory. There are no focal filling defects to suggest pulmonary emboli. No significant mediastinal or axillary adenopathy is present. Lungs/Pleura: The lung windows are mildly degraded by patient motion. Small nodules in the left lower lobe medially are stable. No other focal nodule mass, or airspace disease is present. Upper Abdomen: Scattered calcifications are present in the liver. Gallbladder is within normal limits. Exophytic cyst at the upper pole of the left kidney is stable. Musculoskeletal: Bone windows demonstrate fusion of anterior osteophytes across the lower thoracic spine. No focal lytic or blastic lesions are present. The ribs are intact. The scapula is normal bilaterally. Review of the MIP images confirms the above findings. IMPRESSION: 1. No evidence for pulmonary embolus. 2. Stable small nodules in the medial left lower lobe. These nodules measure less than 5  mm. No follow-up needed if patient is low-risk (and has no known or suspected primary neoplasm). Non-contrast chest CT can be considered in 12 months if patient is high-risk. This recommendation follows the consensus statement: Guidelines for Management of Incidental Pulmonary Nodules Detected on CT Images:From the Fleischner Society 2017; published online before print (10.1148/radiol.SG:5268862). 3. Status post endovascular aortic valve replacement. Electronically Signed   By: San Morelle M.D.   On: 09/29/2015 17:15   Microbiology: No results found for this or any previous visit (from the past 240 hour(s)).   Labs: Basic Metabolic Panel:  Recent Labs Lab 09/29/15 1133 09/30/15 0541  NA 138 136  K 3.8 4.1  CL 99* 99*  CO2 32 30  GLUCOSE 111* 149*  BUN 19 27*  CREATININE 1.16  1.23  CALCIUM 8.8* 9.1   Liver Function Tests:  Recent Labs Lab 09/29/15 1133 09/30/15 0541  AST 18 20  ALT 14* 16*  ALKPHOS 73 68  BILITOT 0.6 0.5  PROT 7.3 7.0  ALBUMIN 3.7 3.4*   No results for input(s): LIPASE, AMYLASE in the last 168 hours. No results for input(s): AMMONIA in the last 168 hours. CBC:  Recent Labs Lab 09/29/15 1133 09/30/15 0541  WBC 9.7 11.5*  NEUTROABS 7.0  --   HGB 12.9* 12.5*  HCT 38.1* 37.2*  MCV 91.4 90.5  PLT 261 242   Cardiac Enzymes:  Recent Labs Lab 09/29/15 1133 09/29/15 1522 09/30/15 0030 09/30/15 0545 09/30/15 1139  TROPONINI <0.03 0.06* <0.03 <0.03 <0.03   BNP: BNP (last 3 results)  Recent Labs  09/29/15 1133  BNP 59.0    ProBNP (last 3 results) No results for input(s): PROBNP in the last 8760 hours.  CBG: No results for input(s): GLUCAP in the last 168 hours.     SignedLelon Frohlich  [facility] Pager: [phone] 10/01/2015, 3:35 PM

## 2015-10-02 ENCOUNTER — Encounter (HOSPITAL_COMMUNITY): Payer: Medicare PPO

## 2015-10-06 ENCOUNTER — Encounter (HOSPITAL_COMMUNITY): Payer: Medicare PPO | Attending: Hematology & Oncology

## 2015-10-06 DIAGNOSIS — D500 Iron deficiency anemia secondary to blood loss (chronic): Secondary | ICD-10-CM | POA: Diagnosis not present

## 2015-10-06 LAB — CBC
HCT: 36.8 % — ABNORMAL LOW (ref 39.0–52.0)
Hemoglobin: 12.3 g/dL — ABNORMAL LOW (ref 13.0–17.0)
MCH: 30.8 pg (ref 26.0–34.0)
MCHC: 33.4 g/dL (ref 30.0–36.0)
MCV: 92.2 fL (ref 78.0–100.0)
PLATELETS: 241 10*3/uL (ref 150–400)
RBC: 3.99 MIL/uL — ABNORMAL LOW (ref 4.22–5.81)
RDW: 15.3 % (ref 11.5–15.5)
WBC: 20.1 10*3/uL — AB (ref 4.0–10.5)

## 2015-10-06 LAB — FERRITIN: FERRITIN: 132 ng/mL (ref 24–336)

## 2015-10-20 ENCOUNTER — Encounter: Payer: Self-pay | Admitting: Cardiology

## 2015-10-20 NOTE — Progress Notes (Deleted)
Cardiology Office Note  Date: 10/20/2015   ID: Ryan Shea, Ryan Shea Jan 10, 1946, MRN [id number]  PCP: [facility]  Primary Cardiologist: Kate Sable, MD  No chief complaint on file.   History of Present Illness: Ryan Shea is a 70 y.o. male patient of Dr. Bronson Ing (last seen June 2015) inadvertently placed on my schedule today for a routine visit. I reviewed his chart, multiple interval visits with Ms. Lawrence NP noted, recently saw Dr. Angelena Form as well in June of this year following TAVR for treatment of aortic stenosis.  He is on Coumadin, followed by PCP.  Past Medical History:  Diagnosis Date  . 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 Angelena Form  . Asthma   . Atrial flutter ([place])   . Chronic anemia   . CKD (chronic kidney disease) stage 3, GFR 30-59 ml/min   . COPD (chronic obstructive pulmonary disease) ([place])   . Essential hypertension   . Gout   . Helicobacter pylori gastritis 05/2014  . Mitral regurgitation   . Multiple duodenal ulcers 05/2014  . Prostate cancer [facility])     Past Surgical History:  Procedure Laterality Date  . CARDIAC CATHETERIZATION N/A 03/26/2015   Procedure: Right/Left Heart Cath and Coronary Angiography;  Surgeon: Burnell Blanks, MD;  Location: [place] [facility];  Service: Cardiovascular;  Laterality: N/A;  . COLONOSCOPY    . COLONOSCOPY N/A 08/25/2014   Rehman:examination performed to cecum. moderate number of diverticula at sigmoid colon without stigmata of bleed small external hemorrhoids and anal papillae  . ESOPHAGOGASTRODUODENOSCOPY N/A 05/31/2014   H PYLORI GASTRITIS, DUODENAL ULCERS  . GIVENS CAPSULE STUDY N/A 04/18/2015   Procedure: GIVENS CAPSULE STUDY;  Surgeon: Danie Binder, MD;  [facility];  Service: Endoscopy;  Laterality: N/A;  . HERNIA REPAIR    . PROSTATE SURGERY    . TEE WITHOUT CARDIOVERSION N/A 07/08/2015   Procedure: TRANSESOPHAGEAL ECHOCARDIOGRAM (TEE);   Surgeon: Burnell Blanks, MD;  Location: [place];  Service: Open Heart Surgery;  Laterality: N/A;  . TRANSCATHETER AORTIC VALVE REPLACEMENT, TRANSFEMORAL N/A 07/08/2015   Procedure: TRANSCATHETER AORTIC VALVE REPLACEMENT, TRANSFEMORAL;  Surgeon: Burnell Blanks, MD;  Location: [place];  Service: Open Heart Surgery;  Laterality: N/A;    Current Outpatient Prescriptions  Medication Sig Dispense Refill  . albuterol (PROVENTIL) (2.5 MG/3ML) 0.083% nebulizer solution Take 3 mLs (2.5 mg total) by nebulization every 4 (four) hours as needed for wheezing or shortness of breath. 75 mL 12  . allopurinol (ZYLOPRIM) 100 MG tablet Take 100 mg by mouth daily.    Marland Kitchen aspirin 81 MG chewable tablet Chew 1 tablet (81 mg total) by mouth daily. 30 tablet 0  . carvedilol (COREG) 6.25 MG tablet Take 1.5 tablets (9.375 mg total) by mouth 2 (two) times daily. 180 tablet 3  . Fluticasone-Salmeterol (ADVAIR DISKUS) 250-50 MCG/DOSE AEPB Inhale 1 puff into the lungs daily. 60 each 1  . furosemide (LASIX) 40 MG tablet Take 40 mg by mouth daily as needed for edema.    . gabapentin (NEURONTIN) 600 MG tablet Take 300 mg by mouth 3 (three) times daily as needed (for pain/neuropathy).     . Iron Polysacch Cmplx-B12-FA 150-0.025-1 MG CAPS Take 1 capsule by mouth 2 (two) times daily. 60 each 0  . levofloxacin (LEVAQUIN) 750 MG tablet Take 1 tablet (750 mg total) by mouth daily. 3 tablet 0  . pantoprazole (PROTONIX) 40 MG tablet  Take 1 tablet (40 mg total) by mouth daily. 60 tablet 5  . pravastatin (PRAVACHOL) 40 MG tablet Take 1 tablet by mouth at bedtime.     . predniSONE (DELTASONE) 10 MG tablet Take 1 tablet (10 mg total) by mouth daily with breakfast. Take 6 tablets today and then decrease by 1 tablet daily until none are left. 21 tablet 0  . VENTOLIN HFA 108 (90 BASE) MCG/ACT inhaler Inhale 1 puff into the lungs every 6 (six) hours as needed for wheezing or shortness of breath. Reported on 07/02/2015    . warfarin  (COUMADIN) 5 MG tablet Take 5 mg by mouth daily.    Marland Kitchen zolpidem (AMBIEN) 10 MG tablet Take 0.5 tablets (5 mg total) by mouth at bedtime as needed for sleep. 30 tablet 0   No current facility-administered medications for this visit.    Facility-Administered Medications Ordered in Other Visits  Medication Dose Route Frequency Provider Last Rate Last Dose  . 0.9 %  sodium chloride infusion   Intravenous Continuous Baird Cancer, PA-C   Stopped at 08/28/15 1405   Allergies:  Gabapentin   Social History: The patient  reports that he quit smoking about 37 years ago. His smoking use included Cigarettes. He started smoking about 56 years ago. He has a 40.00 pack-year smoking history. He has quit using smokeless tobacco. He reports that he does not drink alcohol or use drugs.   Family History: The patient's family history includes Cancer in his other; Prostate cancer in his brother and brother; Stroke in his mother.   ROS:  Please see the history of present illness. Otherwise, complete review of systems is positive for {NONE DEFAULTED:18576::"none"}.  All other systems are reviewed and negative.   Physical Exam: VS:  There were no vitals taken for this visit., BMI There is no height or weight on file to calculate BMI.  Wt Readings from Last 3 Encounters:  10/01/15 244 lb 1.1 oz (110.7 kg)  08/13/15 244 lb 1.9 oz (110.7 kg)  08/08/15 240 lb 12.8 oz (109.2 kg)    General: Patient appears comfortable at rest. HEENT: Conjunctiva and lids normal, oropharynx clear with moist mucosa. Neck: Supple, no elevated JVP or carotid bruits, no thyromegaly. Lungs: Clear to auscultation, nonlabored breathing at rest. Cardiac: Regular rate and rhythm, no S3 or significant systolic murmur, no pericardial rub. Abdomen: Soft, nontender, no hepatomegaly, bowel sounds present, no guarding or rebound. Extremities: No pitting edema, distal pulses 2+. Skin: Warm and dry. Musculoskeletal: No  kyphosis. Neuropsychiatric: Alert and oriented x3, affect grossly appropriate.  ECG: I personally reviewed the tracing from 09/29/2015 which showed sinus rhythm with left bundle-branch block.  Recent Labwork: 04/30/2015: TSH 0.156 07/09/2015: Magnesium 1.8 09/29/2015: B Natriuretic Peptide 59.0 09/30/2015: ALT 16; AST 20; BUN 27; Creatinine, Ser 1.23; Potassium 4.1; Sodium 136 10/06/2015: Hemoglobin 12.3; Platelets 241     Component Value Date/Time   CHOL 166 03/26/2015 0416   TRIG 71 03/26/2015 0416   HDL 46 03/26/2015 0416   CHOLHDL 3.6 03/26/2015 0416   VLDL 14 03/26/2015 0416   LDLCALC 106 (H) 03/26/2015 0416    Other Studies Reviewed Today:  Echocardiogram 09/30/2015: Study Conclusions  - Left ventricle: The cavity size was normal. There was moderate   concentric hypertrophy. Systolic function was normal. The   estimated ejection fraction was in the range of 60% to 65%. - Aortic valve: Bioprosthetic aortic valve s/p TAVR. No significant   regurgitation noted. No significant stenosis. Valve area (VTI):  1.91 cm^2. Valve area (Vmax): 1.9 cm^2. Valve area (Vmean): 1.84   cm^2. - Mitral valve: Calcified annulus. Mildly calcified leaflets . - Left atrium: The atrium was mildly to moderately dilated. - Atrial septum: No defect or patent foramen ovale was identified.  Cardiac catheterization 03/26/2015: 1. No angiographic evidence of CAD 2. Severe aortic valve stenosis (mean gradient 33 mmHg, peak gradient 39 mmHg, AVA 0.95 cm2).  3. Elevated troponin likely due to demand ischemia with severe AS and acute COPD exacerbation.   Recommendations: He has no evidence of CAD or plaque rupture. Elevated troponin likely due to demand ischemia given aortic stenosis and acute COPD exacerbation. Will hydrate overnight. Consider outpatient referral to CT surgery once COPD exacerbation resolved to discuss AVR.   Assessment and Plan:   Current medicines were reviewed with the patient  today.  No orders of the defined types were placed in this encounter.   Disposition:  Signed, Satira Sark, MD, [facility] 10/20/2015 3:29 PM    [place] [facility] [place]. [address] [place], [place] [address] Phone: [phone]; Fax: [phone]

## 2015-10-21 ENCOUNTER — Ambulatory Visit: Payer: Medicare PPO | Admitting: Cardiology

## 2015-10-21 ENCOUNTER — Encounter: Payer: Self-pay | Admitting: Cardiology

## 2015-10-21 DIAGNOSIS — R0989 Other specified symptoms and signs involving the circulatory and respiratory systems: Secondary | ICD-10-CM

## 2015-11-01 ENCOUNTER — Emergency Department (HOSPITAL_COMMUNITY): Payer: Medicare PPO

## 2015-11-01 ENCOUNTER — Other Ambulatory Visit (HOSPITAL_COMMUNITY): Payer: Self-pay

## 2015-11-01 ENCOUNTER — Observation Stay (HOSPITAL_COMMUNITY): Payer: Medicare PPO

## 2015-11-01 ENCOUNTER — Other Ambulatory Visit: Payer: Self-pay

## 2015-11-01 ENCOUNTER — Encounter (HOSPITAL_COMMUNITY): Payer: Self-pay | Admitting: Emergency Medicine

## 2015-11-01 ENCOUNTER — Observation Stay (HOSPITAL_COMMUNITY)
Admission: EM | Admit: 2015-11-01 | Discharge: 2015-11-03 | Disposition: A | Discharge status: Home / Self Care | Payer: Medicare PPO | Attending: Internal Medicine | Admitting: Internal Medicine

## 2015-11-01 DIAGNOSIS — K219 Gastro-esophageal reflux disease without esophagitis: Secondary | ICD-10-CM | POA: Diagnosis not present

## 2015-11-01 DIAGNOSIS — N183 Chronic kidney disease, stage 3 (moderate): Secondary | ICD-10-CM | POA: Diagnosis not present

## 2015-11-01 DIAGNOSIS — Z952 Presence of prosthetic heart valve: Secondary | ICD-10-CM | POA: Diagnosis not present

## 2015-11-01 DIAGNOSIS — E041 Nontoxic single thyroid nodule: Secondary | ICD-10-CM | POA: Diagnosis not present

## 2015-11-01 DIAGNOSIS — I4892 Unspecified atrial flutter: Secondary | ICD-10-CM | POA: Insufficient documentation

## 2015-11-01 DIAGNOSIS — I1310 Hypertensive heart and chronic kidney disease without heart failure, with stage 1 through stage 4 chronic kidney disease, or unspecified chronic kidney disease: Secondary | ICD-10-CM | POA: Diagnosis not present

## 2015-11-01 DIAGNOSIS — Z7982 Long term (current) use of aspirin: Secondary | ICD-10-CM | POA: Insufficient documentation

## 2015-11-01 DIAGNOSIS — Z79899 Other long term (current) drug therapy: Secondary | ICD-10-CM | POA: Diagnosis not present

## 2015-11-01 DIAGNOSIS — Z87891 Personal history of nicotine dependence: Secondary | ICD-10-CM | POA: Insufficient documentation

## 2015-11-01 DIAGNOSIS — E785 Hyperlipidemia, unspecified: Secondary | ICD-10-CM | POA: Diagnosis not present

## 2015-11-01 DIAGNOSIS — J441 Chronic obstructive pulmonary disease with (acute) exacerbation: Secondary | ICD-10-CM | POA: Diagnosis not present

## 2015-11-01 DIAGNOSIS — R0602 Shortness of breath: Secondary | ICD-10-CM | POA: Diagnosis present

## 2015-11-01 DIAGNOSIS — R911 Solitary pulmonary nodule: Secondary | ICD-10-CM | POA: Insufficient documentation

## 2015-11-01 DIAGNOSIS — I252 Old myocardial infarction: Secondary | ICD-10-CM | POA: Insufficient documentation

## 2015-11-01 DIAGNOSIS — I10 Essential (primary) hypertension: Secondary | ICD-10-CM | POA: Diagnosis present

## 2015-11-01 DIAGNOSIS — Z7951 Long term (current) use of inhaled steroids: Secondary | ICD-10-CM | POA: Diagnosis not present

## 2015-11-01 DIAGNOSIS — M109 Gout, unspecified: Secondary | ICD-10-CM | POA: Insufficient documentation

## 2015-11-01 DIAGNOSIS — Z7901 Long term (current) use of anticoagulants: Secondary | ICD-10-CM | POA: Diagnosis not present

## 2015-11-01 DIAGNOSIS — I340 Nonrheumatic mitral (valve) insufficiency: Secondary | ICD-10-CM | POA: Diagnosis not present

## 2015-11-01 HISTORY — DX: Solitary pulmonary nodule: R91.1

## 2015-11-01 HISTORY — DX: Nontoxic single thyroid nodule: E04.1

## 2015-11-01 LAB — CBC
HCT: 37.3 % — ABNORMAL LOW (ref 39.0–52.0)
HEMATOCRIT: 36.9 % — AB (ref 39.0–52.0)
HEMOGLOBIN: 12.2 g/dL — AB (ref 13.0–17.0)
HEMOGLOBIN: 11.9 g/dL — AB (ref 13.0–17.0)
MCH: 30.4 pg (ref 26.0–34.0)
MCH: 30.1 pg (ref 26.0–34.0)
MCHC: 32.7 g/dL (ref 30.0–36.0)
MCHC: 32.2 g/dL (ref 30.0–36.0)
MCV: 93 fL (ref 78.0–100.0)
MCV: 93.4 fL (ref 78.0–100.0)
Platelets: 309 10*3/uL (ref 150–400)
Platelets: 310 10*3/uL (ref 150–400)
RBC: 4.01 MIL/uL — AB (ref 4.22–5.81)
RBC: 3.95 MIL/uL — AB (ref 4.22–5.81)
RDW: 13.9 % (ref 11.5–15.5)
RDW: 13.8 % (ref 11.5–15.5)
WBC: 16 10*3/uL — ABNORMAL HIGH (ref 4.0–10.5)
WBC: 15.3 10*3/uL — AB (ref 4.0–10.5)

## 2015-11-01 LAB — COMPREHENSIVE METABOLIC PANEL
ALT: 11 U/L — ABNORMAL LOW (ref 17–63)
ANION GAP: 9 (ref 5–15)
AST: 14 U/L — ABNORMAL LOW (ref 15–41)
Albumin: 3.6 g/dL (ref 3.5–5.0)
Alkaline Phosphatase: 89 U/L (ref 38–126)
BUN: 21 mg/dL — ABNORMAL HIGH (ref 6–20)
CHLORIDE: 94 mmol/L — AB (ref 101–111)
CO2: 36 mmol/L — ABNORMAL HIGH (ref 22–32)
CREATININE: 1.13 mg/dL (ref 0.61–1.24)
Calcium: 9.2 mg/dL (ref 8.9–10.3)
Glucose, Bld: 158 mg/dL — ABNORMAL HIGH (ref 65–99)
POTASSIUM: 3.8 mmol/L (ref 3.5–5.1)
SODIUM: 139 mmol/L (ref 135–145)
Total Bilirubin: 0.7 mg/dL (ref 0.3–1.2)
Total Protein: 7 g/dL (ref 6.5–8.1)

## 2015-11-01 LAB — BASIC METABOLIC PANEL
ANION GAP: 5 (ref 5–15)
BUN: 20 mg/dL (ref 6–20)
CALCIUM: 8.6 mg/dL — AB (ref 8.9–10.3)
CO2: 36 mmol/L — AB (ref 22–32)
Chloride: 96 mmol/L — ABNORMAL LOW (ref 101–111)
Creatinine, Ser: 1.21 mg/dL (ref 0.61–1.24)
GFR, EST NON AFRICAN AMERICAN: 58 mL/min — AB (ref >60)
Glucose, Bld: 139 mg/dL — ABNORMAL HIGH (ref 65–99)
POTASSIUM: 3.4 mmol/L — AB (ref 3.5–5.1)
Sodium: 137 mmol/L (ref 135–145)

## 2015-11-01 LAB — PROTIME-INR
INR: 2.47
Prothrombin Time: 27.2 seconds — ABNORMAL HIGH (ref 11.4–15.2)

## 2015-11-01 LAB — GLUCOSE, CAPILLARY: GLUCOSE-CAPILLARY: 159 mg/dL — AB (ref 65–99)

## 2015-11-01 MED ORDER — ACETAMINOPHEN 650 MG RE SUPP: 650.0000 mg | Freq: Four times a day (QID) | RECTAL | Status: DC | PRN

## 2015-11-01 MED ORDER — ACETAMINOPHEN 325 MG PO TABS: 650.0000 mg | ORAL_TABLET | Freq: Four times a day (QID) | ORAL | Status: DC | PRN

## 2015-11-01 MED ORDER — ONDANSETRON HCL 4 MG PO TABS: 4.0000 mg | ORAL_TABLET | Freq: Four times a day (QID) | ORAL | Status: DC | PRN

## 2015-11-01 MED ORDER — LEVOFLOXACIN 750 MG PO TABS
750.0000 mg | ORAL_TABLET | Freq: Every day | ORAL | Status: DC
  Administered 2015-11-01 – 2015-11-03 (×3): 750 mg via ORAL
  Filled 2015-11-01 (×3): qty 1

## 2015-11-01 MED ORDER — ORAL CARE MOUTH RINSE
15.0000 mL | Freq: Two times a day (BID) | OROMUCOSAL | Status: DC
  Administered 2015-11-01 – 2015-11-02 (×4): 15 mL via OROMUCOSAL

## 2015-11-01 MED ORDER — ASPIRIN 81 MG PO CHEW
81.0000 mg | CHEWABLE_TABLET | Freq: Every day | ORAL | Status: DC
  Administered 2015-11-01 – 2015-11-03 (×3): 81 mg via ORAL
  Filled 2015-11-01 (×3): qty 1

## 2015-11-01 MED ORDER — IPRATROPIUM BROMIDE 0.02 % IN SOLN: 0.5000 mg | Freq: Four times a day (QID) | RESPIRATORY_TRACT | Status: DC

## 2015-11-01 MED ORDER — CARVEDILOL 3.125 MG PO TABS
9.3750 mg | ORAL_TABLET | Freq: Two times a day (BID) | ORAL | Status: DC
  Administered 2015-11-01 – 2015-11-03 (×5): 9.375 mg via ORAL
  Filled 2015-11-01 (×5): qty 3

## 2015-11-01 MED ORDER — ENOXAPARIN SODIUM 40 MG/0.4ML ~~LOC~~ SOLN: 40.0000 mg | SUBCUTANEOUS | Status: DC

## 2015-11-01 MED ORDER — FLUTICASONE FUROATE-VILANTEROL 200-25 MCG/INH IN AEPB
1.0000 | INHALATION_SPRAY | Freq: Every day | RESPIRATORY_TRACT | Status: DC
  Administered 2015-11-02 – 2015-11-03 (×2): 1 via RESPIRATORY_TRACT
  Filled 2015-11-01: qty 28

## 2015-11-01 MED ORDER — PRAVASTATIN SODIUM 40 MG PO TABS
40.0000 mg | ORAL_TABLET | Freq: Every day | ORAL | Status: DC
  Administered 2015-11-01 – 2015-11-02 (×2): 40 mg via ORAL
  Filled 2015-11-01 (×2): qty 1

## 2015-11-01 MED ORDER — WARFARIN SODIUM 5 MG PO TABS
3.5000 mg | ORAL_TABLET | Freq: Once | ORAL | Status: AC
  Administered 2015-11-01: 3.5 mg via ORAL
  Filled 2015-11-01: qty 1

## 2015-11-01 MED ORDER — WARFARIN - PHARMACIST DOSING INPATIENT
Freq: Every day | Status: DC
  Administered 2015-11-01: 1

## 2015-11-01 MED ORDER — ALLOPURINOL 100 MG PO TABS
100.0000 mg | ORAL_TABLET | Freq: Every day | ORAL | Status: DC
  Administered 2015-11-01 – 2015-11-03 (×3): 100 mg via ORAL
  Filled 2015-11-01 (×3): qty 1

## 2015-11-01 MED ORDER — ONDANSETRON HCL 4 MG/2ML IJ SOLN: 4.0000 mg | Freq: Three times a day (TID) | INTRAMUSCULAR | Status: AC | PRN

## 2015-11-01 MED ORDER — ONDANSETRON HCL 4 MG/2ML IJ SOLN: 4.0000 mg | Freq: Four times a day (QID) | INTRAMUSCULAR | Status: DC | PRN

## 2015-11-01 MED ORDER — SODIUM CHLORIDE 0.9 % IV SOLN: INTRAVENOUS | Status: AC

## 2015-11-01 MED ORDER — ALBUTEROL SULFATE (2.5 MG/3ML) 0.083% IN NEBU: 2.5000 mg | INHALATION_SOLUTION | RESPIRATORY_TRACT | Status: AC | PRN

## 2015-11-01 MED ORDER — METHYLPREDNISOLONE SODIUM SUCC 125 MG IJ SOLR
60.0000 mg | Freq: Four times a day (QID) | INTRAMUSCULAR | Status: DC
  Administered 2015-11-01 – 2015-11-02 (×5): 60 mg via INTRAVENOUS
  Filled 2015-11-01 (×5): qty 2

## 2015-11-01 MED ORDER — POTASSIUM CHLORIDE CRYS ER 20 MEQ PO TBCR
40.0000 meq | EXTENDED_RELEASE_TABLET | Freq: Once | ORAL | Status: AC
  Administered 2015-11-01: 40 meq via ORAL
  Filled 2015-11-01: qty 2

## 2015-11-01 MED ORDER — ZOLPIDEM TARTRATE 5 MG PO TABS: 5.0000 mg | ORAL_TABLET | Freq: Every evening | ORAL | Status: DC | PRN

## 2015-11-01 MED ORDER — IPRATROPIUM-ALBUTEROL 0.5-2.5 (3) MG/3ML IN SOLN
2.5000 mg | Freq: Four times a day (QID) | RESPIRATORY_TRACT | Status: DC
  Administered 2015-11-01 – 2015-11-02 (×5): 3 mg via RESPIRATORY_TRACT
  Administered 2015-11-02: 2.5 mg via RESPIRATORY_TRACT
  Administered 2015-11-02 – 2015-11-03 (×3): 3 mg via RESPIRATORY_TRACT
  Filled 2015-11-01 (×9): qty 3

## 2015-11-01 MED ORDER — GUAIFENESIN ER 600 MG PO TB12
600.0000 mg | ORAL_TABLET | Freq: Two times a day (BID) | ORAL | Status: DC
  Administered 2015-11-01 – 2015-11-03 (×5): 600 mg via ORAL
  Filled 2015-11-01 (×5): qty 1

## 2015-11-01 MED ORDER — GABAPENTIN 300 MG PO CAPS: 300.0000 mg | ORAL_CAPSULE | Freq: Three times a day (TID) | ORAL | Status: DC | PRN

## 2015-11-01 MED ORDER — PANTOPRAZOLE SODIUM 40 MG PO TBEC
40.0000 mg | DELAYED_RELEASE_TABLET | Freq: Every day | ORAL | Status: DC
  Administered 2015-11-01 – 2015-11-03 (×3): 40 mg via ORAL
  Filled 2015-11-01 (×3): qty 1

## 2015-11-01 NOTE — ED Provider Notes (Signed)
[place] DEPT Provider Note   CSN: [id number] Arrival date & time: 11/01/15  0052     History   Chief Complaint Chief Complaint  Patient presents with  . Shortness of Breath    HPI Ryan Shea is a 70 y.o. male.  HPI Ems called out for sob when ems arrive pt was 82% on room air. Pt was given 2 albuterol treatments and 125mg  solumedrol iv.   Past Medical History:  Diagnosis Date  . 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 Angelena Form  . Asthma   . Atrial flutter ([place])   . Chronic anemia   . CKD (chronic kidney disease) stage 3, GFR 30-59 ml/min   . COPD (chronic obstructive pulmonary disease) ([place])   . Essential hypertension   . Gout   . Helicobacter pylori gastritis 05/2014  . Mitral regurgitation   . Multiple duodenal ulcers 05/2014  . Prostate cancer [facility])     Patient Active Problem List   Diagnosis Date Noted  . Iron deficiency anemia due to chronic blood loss 08/12/2015  . Severe aortic stenosis 07/08/2015  . Blood in stool   . NSTEMI (non-ST elevated myocardial infarction) ([place]) 03/22/2015  . CKD (chronic kidney disease) stage 3, GFR 30-59 ml/min 03/22/2015  . Morbid obesity ([place]) 03/22/2015  . Elevated troponin   . COPD exacerbation ([place]) 03/02/2015  . Acute renal failure ([place]) 08/24/2014  . Chronic anticoagulation 08/24/2014  . GI bleed 08/23/2014  . Anemia 08/23/2014  . Rectal bleeding 08/23/2014  . Colon cancer screening 07/11/2014  . Multiple duodenal ulcers 05/30/2014  . Aortic stenosis 04/23/2013  . Mitral regurgitation 04/23/2013  . Cardiomyopathy ([place]) 04/23/2013  . Paroxysmal atrial flutter ([place]) 04/23/2013  . HTN (hypertension) 04/20/2013  . High cholesterol 04/20/2013  . COPD (chronic obstructive pulmonary disease) ([place]) 04/20/2013  . Prostate cancer ([place]) 04/20/2013  . Backache, unspecified 04/20/2013  . Body mass index 40.0-44.9, adult ([place]) 04/20/2013    Past Surgical History:  Procedure Laterality Date  .  CARDIAC CATHETERIZATION N/A 03/26/2015   Procedure: Right/Left Heart Cath and Coronary Angiography;  Surgeon: Burnell Blanks, MD;  Location: [place] CV LAB;  Service: Cardiovascular;  Laterality: N/A;  . COLONOSCOPY    . COLONOSCOPY N/A 08/25/2014   Rehman:examination performed to cecum. moderate number of diverticula at sigmoid colon without stigmata of bleed small external hemorrhoids and anal papillae  . ESOPHAGOGASTRODUODENOSCOPY N/A 05/31/2014   H PYLORI GASTRITIS, DUODENAL ULCERS  . GIVENS CAPSULE STUDY N/A 04/18/2015   Procedure: GIVENS CAPSULE STUDY;  Surgeon: Danie Binder, MD;  [facility];  Service: Endoscopy;  Laterality: N/A;  . HERNIA REPAIR    . PROSTATE SURGERY    . TEE WITHOUT CARDIOVERSION N/A 07/08/2015   Procedure: TRANSESOPHAGEAL ECHOCARDIOGRAM (TEE);  Surgeon: Burnell Blanks, MD;  Location: [place];  Service: Open Heart Surgery;  Laterality: N/A;  . TRANSCATHETER AORTIC VALVE REPLACEMENT, TRANSFEMORAL N/A 07/08/2015   Procedure: TRANSCATHETER AORTIC VALVE REPLACEMENT, TRANSFEMORAL;  Surgeon: Burnell Blanks, MD;  Location: [place];  Service: Open Heart Surgery;  Laterality: N/A;       Home Medications    Prior to Admission medications   Medication Sig Start Date End Date Taking? Authorizing Provider  albuterol (PROVENTIL) (2.5 MG/3ML) 0.083% nebulizer solution Take 3 mLs (2.5 mg total) by nebulization every 4 (four) hours as needed for wheezing or shortness of breath. 03/03/15   Kathie Dike, MD  allopurinol (ZYLOPRIM) 100 MG tablet Take  100 mg by mouth daily. 09/25/15   Historical Provider, MD  aspirin 81 MG chewable tablet Chew 1 tablet (81 mg total) by mouth daily. 03/27/15   Florencia Reasons, MD  carvedilol (COREG) 6.25 MG tablet Take 1.5 tablets (9.375 mg total) by mouth 2 (two) times daily. 07/21/15   Lendon Colonel, NP  Fluticasone-Salmeterol (ADVAIR DISKUS) 250-50 MCG/DOSE AEPB Inhale 1 puff into the lungs daily. 08/25/14   Kathie Dike, MD  furosemide (LASIX) 40 MG tablet Take 40 mg by mouth daily as needed for edema.    Historical Provider, MD  gabapentin (NEURONTIN) 600 MG tablet Take 300 mg by mouth 3 (three) times daily as needed (for pain/neuropathy).  05/08/15   Historical Provider, MD  Iron Polysacch Cmplx-B12-FA 150-0.025-1 MG CAPS Take 1 capsule by mouth 2 (two) times daily. 05/14/15   Patrici Ranks, MD  levofloxacin (LEVAQUIN) 750 MG tablet Take 1 tablet (750 mg total) by mouth daily. 10/01/15   Erline Hau, MD  pantoprazole (PROTONIX) 40 MG tablet Take 1 tablet (40 mg total) by mouth daily. 08/25/14   Kathie Dike, MD  pravastatin (PRAVACHOL) 40 MG tablet Take 1 tablet by mouth at bedtime.  07/06/13   Historical Provider, MD  predniSONE (DELTASONE) 10 MG tablet Take 1 tablet (10 mg total) by mouth daily with breakfast. Take 6 tablets today and then decrease by 1 tablet daily until none are left. 10/01/15   Erline Hau, MD  VENTOLIN HFA 108 (90 BASE) MCG/ACT inhaler Inhale 1 puff into the lungs every 6 (six) hours as needed for wheezing or shortness of breath. Reported on 07/02/2015 05/10/14   Historical Provider, MD  warfarin (COUMADIN) 5 MG tablet Take 5 mg by mouth daily.    Historical Provider, MD  zolpidem (AMBIEN) 10 MG tablet Take 0.5 tablets (5 mg total) by mouth at bedtime as needed for sleep. 03/27/15   Florencia Reasons, MD    Family History Family History  Problem Relation Age of Onset  . Stroke Mother   . Prostate cancer Brother     [name]  . Cancer Other   . Prostate cancer Brother     [name]  . Colon cancer Neg Hx   . Colon polyps Neg Hx     Social History Social History  Substance Use Topics  . Smoking status: Former Smoker    Packs/day: 2.00    Years: 20.00    Types: Cigarettes    Start date: 09/01/1959    Quit date: 09/01/1978  . Smokeless tobacco: Former Systems developer     Comment: quit 30 years ago  . Alcohol use No     Allergies   Gabapentin   Review of Systems Review  of Systems  Constitutional: Positive for fever. Negative for chills.  Respiratory: Positive for cough and wheezing.   All other systems reviewed and are negative.    Physical Exam Updated Vital Signs BP 121/66   Pulse 65   Temp 97.7 F (36.5 C) (Oral)   Resp 16   Ht 5\' 6"  (1.676 m)   Wt 246 lb (111.6 kg)   SpO2 96%   BMI 39.71 kg/m   Physical Exam  Constitutional: He is oriented to person, place, and time. He appears well-developed and well-nourished. He appears distressed.  HENT:  Head: Normocephalic and atraumatic.  Eyes: Pupils are equal, round, and reactive to light.  Neck: Normal range of motion.  Cardiovascular: Normal rate and intact distal pulses.   Pulmonary/Chest: Tachypnea  noted. No respiratory distress. He has wheezes. He has rhonchi.  Abdominal: Normal appearance. He exhibits no distension.  Musculoskeletal: Normal range of motion.  Neurological: He is alert and oriented to person, place, and time. No cranial nerve deficit.  Skin: Skin is warm and dry. No rash noted.  Psychiatric: He has a normal mood and affect. His behavior is normal.  Nursing note and vitals reviewed.    ED Treatments / Results  Labs (all labs ordered are listed, but only abnormal results are displayed) Labs Reviewed  BASIC METABOLIC PANEL - Abnormal; Notable for the following:       Result Value   Potassium 3.4 (*)    Chloride 96 (*)    CO2 36 (*)    Glucose, Bld 139 (*)    Calcium 8.6 (*)    GFR calc non Af Amer 58 (*)    All other components within normal limits  CBC - Abnormal; Notable for the following:    WBC 16.0 (*)    RBC 4.01 (*)    Hemoglobin 12.2 (*)    HCT 37.3 (*)    All other components within normal limits    EKG  EKG Interpretation None       Radiology Dg Chest Portable 1 View  Result Date: 11/01/2015 CLINICAL DATA:  Shortness of breath. Wheezing worsening tonight. No chest pain. EXAM: PORTABLE CHEST 1 VIEW COMPARISON:  09/29/2015 FINDINGS:  Borderline heart size. Cardiac valve prosthesis. No vascular congestion. No focal airspace disease or consolidation. No blunting of costophrenic angles. No pneumothorax. Calcific aortic atherosclerosis. No change since previous study. IMPRESSION: Cardiac enlargement.  No evidence of active pulmonary disease. Electronically Signed   By: Lucienne Capers M.D.   On: 11/01/2015 01:41    Procedures Procedures (including critical care time)  Medications Ordered in ED Medications - No data to display   Initial Impression / Assessment and Plan / ED Course  I have reviewed the triage vital signs and the nursing notes.  Pertinent labs & imaging results that were available during my care of the patient were reviewed by me and considered in my medical decision making (see chart for details).  Clinical Course      Final Clinical Impressions(s) / ED Diagnoses   Final diagnoses:  COPD exacerbation [facility])    New Prescriptions New Prescriptions   No medications on file     Leonard Schwartz, MD 11/01/15 [id number]

## 2015-11-01 NOTE — ED Triage Notes (Signed)
Ems called out for sob when ems arrive pt was 82% on room air. Pt was given 2 albuterol treatments and 125mg  solumedrol iv.

## 2015-11-01 NOTE — H&P (Signed)
TRH H&P   Patient Demographics:    Ryan Shea, is a 70 y.o. male  MRN: [id number]  DOB - [birth year]  Admit Date - 11/01/2015  Outpatient Primary MD for the patient is [facility]  Referring MD/NP/PA: Dr. Audie Pinto  Patient coming from: Home  Chief Complaint  Patient presents with  . Shortness of Breath      HPI:    Ryan Shea  is a 70 y.o. male, With history of COPD was brought to the hospital by EMS for worsening shortness of breath. As per the patient's daughter she noted that patient was having labored breathing at home and also is coughing up green colored phlegm. She called EMS and patient was brought to the hospital. On arrival patient's O2 sats was 82% on room air. Patient denies any chest pain, says he feels better. Denies vomiting or diarrhea. Did have nausea today  The ED patient started on Solu-Medrol and given 2 albuterol treatments.    Review of systems:    In addition to the HPI above,  No Fever-chills, No Headache, No changes with Vision or hearing, No problems swallowing food or Liquids, No Abdominal pain, No Nausea or Vomiting, bowel movements are regular, No Blood in stool or Urine, No dysuria, No new skin rashes or bruises, No new joints pains-aches,  No new weakness, tingling, numbness in any extremity, No recent weight gain or loss, No polyuria, polydypsia or polyphagia, No significant Mental Stressors.  A full 10 point Review of Systems was done, except as stated above, all other Review of Systems were negative.   With Past History of the following :    Past Medical History:  Diagnosis Date  . 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 Angelena Form  . Asthma   . Atrial flutter ([place])   . Chronic anemia   . CKD (chronic kidney disease) stage 3, GFR 30-59 ml/min   . COPD (chronic obstructive pulmonary disease) ([place])   .  Essential hypertension   . Gout   . Helicobacter pylori gastritis 05/2014  . Mitral regurgitation   . Multiple duodenal ulcers 05/2014  . Prostate cancer [facility])       Past Surgical History:  Procedure Laterality Date  . CARDIAC CATHETERIZATION N/A 03/26/2015   Procedure: Right/Left Heart Cath and Coronary Angiography;  Surgeon: Burnell Blanks, MD;  Location: [place] CV LAB;  Service: Cardiovascular;  Laterality: N/A;  . COLONOSCOPY    . COLONOSCOPY N/A 08/25/2014   Rehman:examination performed to cecum. moderate number of diverticula at sigmoid colon without stigmata of bleed small external hemorrhoids and anal papillae  . ESOPHAGOGASTRODUODENOSCOPY N/A 05/31/2014   H PYLORI GASTRITIS, DUODENAL ULCERS  . GIVENS CAPSULE STUDY N/A 04/18/2015   Procedure: GIVENS CAPSULE STUDY;  Surgeon: Danie Binder, MD;  [facility];  Service: Endoscopy;  Laterality: N/A;  . HERNIA REPAIR    . PROSTATE SURGERY    . TEE WITHOUT CARDIOVERSION N/A 07/08/2015  Procedure: TRANSESOPHAGEAL ECHOCARDIOGRAM (TEE);  Surgeon: Burnell Blanks, MD;  Location: [place];  Service: Open Heart Surgery;  Laterality: N/A;  . TRANSCATHETER AORTIC VALVE REPLACEMENT, TRANSFEMORAL N/A 07/08/2015   Procedure: TRANSCATHETER AORTIC VALVE REPLACEMENT, TRANSFEMORAL;  Surgeon: Burnell Blanks, MD;  Location: [place];  Service: Open Heart Surgery;  Laterality: N/A;      Social History:     Social History  Substance Use Topics  . Smoking status: Former Smoker    Packs/day: 2.00    Years: 20.00    Types: Cigarettes    Start date: 09/01/1959    Quit date: 09/01/1978  . Smokeless tobacco: Former Systems developer     Comment: quit 30 years ago  . Alcohol use No        Family History :     Family History  Problem Relation Age of Onset  . Stroke Mother   . Prostate cancer Brother     [name]  . Cancer Other   . Prostate cancer Brother     [name]  . Colon cancer Neg Hx   . Colon polyps Neg Hx        Home Medications:   Prior to Admission medications   Medication Sig Start Date End Date Taking? Authorizing Provider  albuterol (PROVENTIL) (2.5 MG/3ML) 0.083% nebulizer solution Take 3 mLs (2.5 mg total) by nebulization every 4 (four) hours as needed for wheezing or shortness of breath. 03/03/15   Kathie Dike, MD  allopurinol (ZYLOPRIM) 100 MG tablet Take 100 mg by mouth daily. 09/25/15   Historical Provider, MD  aspirin 81 MG chewable tablet Chew 1 tablet (81 mg total) by mouth daily. 03/27/15   Florencia Reasons, MD  carvedilol (COREG) 6.25 MG tablet Take 1.5 tablets (9.375 mg total) by mouth 2 (two) times daily. 07/21/15   Lendon Colonel, NP  Fluticasone-Salmeterol (ADVAIR DISKUS) 250-50 MCG/DOSE AEPB Inhale 1 puff into the lungs daily. 08/25/14   Kathie Dike, MD  furosemide (LASIX) 40 MG tablet Take 40 mg by mouth daily as needed for edema.    Historical Provider, MD  gabapentin (NEURONTIN) 600 MG tablet Take 300 mg by mouth 3 (three) times daily as needed (for pain/neuropathy).  05/08/15   Historical Provider, MD  Iron Polysacch Cmplx-B12-FA 150-0.025-1 MG CAPS Take 1 capsule by mouth 2 (two) times daily. 05/14/15   Patrici Ranks, MD  levofloxacin (LEVAQUIN) 750 MG tablet Take 1 tablet (750 mg total) by mouth daily. 10/01/15   Erline Hau, MD  pantoprazole (PROTONIX) 40 MG tablet Take 1 tablet (40 mg total) by mouth daily. 08/25/14   Kathie Dike, MD  pravastatin (PRAVACHOL) 40 MG tablet Take 1 tablet by mouth at bedtime.  07/06/13   Historical Provider, MD  predniSONE (DELTASONE) 10 MG tablet Take 1 tablet (10 mg total) by mouth daily with breakfast. Take 6 tablets today and then decrease by 1 tablet daily until none are left. 10/01/15   Erline Hau, MD  VENTOLIN HFA 108 (90 BASE) MCG/ACT inhaler Inhale 1 puff into the lungs every 6 (six) hours as needed for wheezing or shortness of breath. Reported on 07/02/2015 05/10/14   Historical Provider, MD  warfarin (COUMADIN) 5 MG  tablet Take 5 mg by mouth daily.    Historical Provider, MD  zolpidem (AMBIEN) 10 MG tablet Take 0.5 tablets (5 mg total) by mouth at bedtime as needed for sleep. 03/27/15   Florencia Reasons, MD     Allergies:  Allergies  Allergen Reactions  . Gabapentin Other (See Comments)    Capsule causes bad heartburn     Physical Exam:   Vitals  Blood pressure 132/70, pulse 94, temperature 97.7 F (36.5 C), temperature source Oral, resp. rate 16, height 5\' 6"  (1.676 m), weight 111.6 kg (246 lb), SpO2 98 %.   1. General Caucasian male lying in bed in NAD, cooperative*with exam  2. Normal affect and insight, Awake Alert, Oriented X 3.  3. No F.N deficits, ALL C.Nerves Intact, Strength 5/5 all 4 extremities, Sensation intact all 4 extremities, Plantars down going.  4. Ears and Eyes appear Normal, Conjunctivae clear, PERRLA. Moist Oral Mucosa.  5. Supple Neck, No JVD, No cervical lymphadenopathy appriciated, No Carotid Bruits.  6. Symmetrical Chest wall movement, bilateral wheezing  7. RRR, No Gallops, Rubs or Murmurs, No Parasternal Heave.No Leg edema  8. Positive Bowel Sounds, Abdomen Soft, No tenderness, No organomegaly appriciated,No rebound -guarding or rigidity.  9.  No Cyanosis, Normal Skin Turgor, No Skin Rash or Bruise.  10. Good muscle tone,  joints appear normal , no effusions, Normal ROM.      Data Review:    CBC  Recent Labs Lab 11/01/15 0148  WBC 16.0*  HGB 12.2*  HCT 37.3*  PLT 309  MCV 93.0  MCH 30.4  MCHC 32.7  RDW 13.9   ------------------------------------------------------------------------------------------------------------------  Chemistries   Recent Labs Lab 11/01/15 0148  NA 137  K 3.4*  CL 96*  CO2 36*  GLUCOSE 139*  BUN 20  CREATININE 1.21  CALCIUM 8.6*    ------------------------------------------------------------------------------------------------------------------  ------------------------------------------------------------------------------------------------------------------  ---------------------------------------------------------------------------------------------------------------     ----------------------------------------------------------------------------------------------------------------   Imaging Results:    Dg Chest Portable 1 View  Result Date: 11/01/2015 CLINICAL DATA:  Shortness of breath. Wheezing worsening tonight. No chest pain. EXAM: PORTABLE CHEST 1 VIEW COMPARISON:  09/29/2015 FINDINGS: Borderline heart size. Cardiac valve prosthesis. No vascular congestion. No focal airspace disease or consolidation. No blunting of costophrenic angles. No pneumothorax. Calcific aortic atherosclerosis. No change since previous study. IMPRESSION: Cardiac enlargement.  No evidence of active pulmonary disease. Electronically Signed   By: Lucienne Capers M.D.   On: 11/01/2015 01:41    My personal review of EKG: Rhythm NSR   Assessment & Plan:    Active Problems:   COPD exacerbation ([place])   S/p TAVR   Hypertension  1. COPD exacerbation- start Solu-Medrol 60 mg IV every 6 hours, DuoNeb nebulizers every 6 hours, Mucinex 1 tablet by mouth twice a day. Levaquin 500 mg daily 2. Status post TAVR- patient has history of aortic stenosis status post  TAVR, continue Coumadin per pharmacy consultation. 3. Hypertension- blood pressure is controlled, continue Coreg   DVT Prophylaxis-   Lovenox   AM Labs Ordered, also please review Full Orders  Family Communication: Admission, patients condition and plan of care including tests being ordered have been discussed with the patient and *his daughter at bedside* who indicate understanding and agree with the plan and Code Status.  Code Status:   Full code  Admission status:  Observation    Time spent in minutes : 60 minutes   Nao Linz S M.D on 11/01/2015 at 3:43 AM  Between 7am to 7pm - Pager - [phone]. After 7pm go to [email or web address] - password [facility]  [facility] - Office  [phone]

## 2015-11-01 NOTE — Progress Notes (Signed)
Patient ID: [name], male   DOB: Sep 02, 1945, 70 y.o.   MRN: [id number]                                                                PROGRESS NOTE                                                                                                                                                                                                             Patient Demographics:    Ryan Shea, is a 70 y.o. male, DOB - December 25, 1945, [id number]  Admit date - 11/01/2015   Admitting Physician Oswald Hillock, MD  Outpatient Primary MD for the patient is [facility]  LOS - 0  Outpatient Specialists:  Dr. Cyndia Bent (CT surgery)  Chief Complaint  Patient presents with  . Shortness of Breath       Brief Narrative  70 y.o. male, With history of COPD was brought to the hospital by EMS for worsening shortness of breath. As per the patient's daughter she noted that patient was having labored breathing at home and also is coughing up green colored phlegm. She called EMS and patient was brought to the hospital. On arrival patient's O2 sats was 82% on room air. Patient denies any chest pain, says he feels better. Denies vomiting or diarrhea. Did have nausea today  The ED patient started on Solu-Medrol and given 2 albuterol treatments.   Subjective:    Ryan Shea feels like his breathing is somewhat better.  Denies fever, chills, cp, palp, n/v, diarrhea, brbrp, black stool.  Pt has slight dry cough.      Assessment  & Plan :    Active Problems:   HTN (hypertension)   COPD exacerbation (HCC)   Thyroid nodule   Lung nodule   1.  Copd exacerbation solumedro 60mg  iv q6h  Cont levaquin Cont duoneb Cont mucinex Start Breo 1puff qday  2. Aflutter Coumadin pharmacy to dose Check INR today , tomorrow  3. Lung nodule on prior CT scan Will need outpatient follow up  4. Thyroid nodule 1.9x2.6 cm on prior CT scan4/12/2015 Check tsh Check thyroid ultrasound  5.  Hypertension Cont carvedilol  6.  Hyperlipidemia Cont pravastatin  7. Gout Cont allopurinol  8.  Gerd Cont PPI  Code Status : FULL CODE  Family Communication  : w patient  Disposition Plan  : home  Barriers For Discharge :   Consults  :  none  Procedures  : CXR 11/01/2015=> negative  DVT Prophylaxis  :  Coumadin  Lab Results  Component Value Date   PLT 310 11/01/2015    Antibiotics  :   Levaquin 8/26/=>  Anti-infectives    Start     Dose/Rate Route Frequency Ordered Stop   11/01/15 1000  levofloxacin (LEVAQUIN) tablet 750 mg     750 mg Oral Daily 11/01/15 0352          Objective:   Vitals:   11/01/15 0300 11/01/15 0330 11/01/15 0645 11/01/15 0759  BP: 116/57 132/70 (!) 114/58   Pulse: 89 94 80   Resp: 15 16 20    Temp:   98 F (36.7 C)   TempSrc:   Oral   SpO2: 94% 98% 100% 97%  Weight:      Height:        Wt Readings from Last 3 Encounters:  11/01/15 111.6 kg (246 lb)  10/01/15 110.7 kg (244 lb 1.1 oz)  08/13/15 110.7 kg (244 lb 1.9 oz)     Intake/Output Summary (Last 24 hours) at 11/01/15 0857 Last data filed at 11/01/15 0850  Gross per 24 hour  Intake              240 ml  Output                0 ml  Net              240 ml     Physical Exam  Awake Alert, Oriented X 3, No new F.N deficits, Normal affect [place].AT,PERRAL Supple Neck,No JVD, No cervical lymphadenopathy appriciated.  Symmetrical Chest wall movement, Good air movement bilaterally, exp wheeze, no crackles RRR,No Gallops,Rubs or new Murmurs, No Parasternal Heave +ve B.Sounds, Abd Soft, No tenderness, No organomegaly appriciated, No rebound - guarding or rigidity. No Cyanosis, Clubbing or edema, No new Rash or bruise      Data Review:    CBC  Recent Labs Lab 11/01/15 0148 11/01/15 0456  WBC 16.0* 15.3*  HGB 12.2* 11.9*  HCT 37.3* 36.9*  PLT 309 310  MCV 93.0 93.4  MCH 30.4 30.1  MCHC 32.7 32.2  RDW 13.9 13.8    Chemistries   Recent Labs Lab 11/01/15 0148  11/01/15 0456  NA 137 139  K 3.4* 3.8  CL 96* 94*  CO2 36* 36*  GLUCOSE 139* 158*  BUN 20 21*  CREATININE 1.21 1.13  CALCIUM 8.6* 9.2  AST  --  14*  ALT  --  11*  ALKPHOS  --  89  BILITOT  --  0.7   ------------------------------------------------------------------------------------------------------------------ No results for input(s): CHOL, HDL, LDLCALC, TRIG, CHOLHDL, LDLDIRECT in the last 72 hours.  Lab Results  Component Value Date   HGBA1C 5.4 07/04/2015   ------------------------------------------------------------------------------------------------------------------ No results for input(s): TSH, T4TOTAL, T3FREE, THYROIDAB in the last 72 hours.  Invalid input(s): FREET3 ------------------------------------------------------------------------------------------------------------------ No results for input(s): VITAMINB12, FOLATE, FERRITIN, TIBC, IRON, RETICCTPCT in the last 72 hours.  Coagulation profile No results for input(s): INR, PROTIME in the last 168 hours.  No results for input(s): DDIMER in the last 72 hours.  Cardiac Enzymes No results for input(s): CKMB, TROPONINI, MYOGLOBIN in the last 168 hours.  Invalid input(s): CK ------------------------------------------------------------------------------------------------------------------    Component Value Date/Time   BNP 59.0 09/29/2015 1133  Inpatient Medications  Scheduled Meds: . sodium chloride   Intravenous STAT  . allopurinol  100 mg Oral Daily  . aspirin  81 mg Oral Daily  . carvedilol  9.375 mg Oral BID  . fluticasone furoate-vilanterol  1 puff Inhalation Daily  . guaiFENesin  600 mg Oral BID  . ipratropium-albuterol  2.5 mg Nebulization Q6H  . levofloxacin  750 mg Oral Daily  . mouth rinse  15 mL Mouth Rinse BID  . methylPREDNISolone (SOLU-MEDROL) injection  60 mg Intravenous Q6H  . pantoprazole  40 mg Oral Daily  . potassium chloride  40 mEq Oral Once  . pravastatin  40 mg Oral QHS    Continuous Infusions:  PRN Meds:.acetaminophen **OR** acetaminophen, albuterol, gabapentin, ondansetron (ZOFRAN) IV, ondansetron **OR** ondansetron (ZOFRAN) IV, zolpidem  Micro Results No results found for this or any previous visit (from the past 240 hour(s)).  Radiology Reports Dg Chest Portable 1 View  Result Date: 11/01/2015 CLINICAL DATA:  Shortness of breath. Wheezing worsening tonight. No chest pain. EXAM: PORTABLE CHEST 1 VIEW COMPARISON:  09/29/2015 FINDINGS: Borderline heart size. Cardiac valve prosthesis. No vascular congestion. No focal airspace disease or consolidation. No blunting of costophrenic angles. No pneumothorax. Calcific aortic atherosclerosis. No change since previous study. IMPRESSION: Cardiac enlargement.  No evidence of active pulmonary disease. Electronically Signed   By: Lucienne Capers M.D.   On: 11/01/2015 01:41    Time Spent in minutes  30   Jani Gravel M.D on 11/01/2015 at 8:57 AM  Between 7am to 7pm - Pager - [phone]  After 7pm go to [email or web address] - password [email or web address]  [facility] -  Office  [phone]

## 2015-11-01 NOTE — Progress Notes (Signed)
ANTICOAGULATION CONSULT NOTE - Initial Consult  Pharmacy Consult for Coumadin Indication: Atrial flutter/TAVR  Allergies  Allergen Reactions  . Gabapentin Other (See Comments)    Capsule causes bad heartburn    Patient Measurements: Height: 5\' 6"  (167.6 cm) Weight: 246 lb (111.6 kg) IBW/kg (Calculated) : 63.8  Vital Signs: Temp: 98 F (36.7 C) (08/26 0645) Temp Source: Oral (08/26 0645) BP: 114/58 (08/26 0645) Pulse Rate: 80 (08/26 0645)  Labs:  Recent Labs  11/01/15 0148 11/01/15 0456  HGB 12.2* 11.9*  HCT 37.3* 36.9*  PLT 309 310  CREATININE 1.21 1.13    Estimated Creatinine Clearance: 70.3 mL/min (by C-G formula based on SCr of 1.13 mg/dL).   Medical History: Past Medical History:  Diagnosis Date  . 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 Angelena Form  . Asthma   . Atrial flutter ([place])   . Chronic anemia   . CKD (chronic kidney disease) stage 3, GFR 30-59 ml/min   . COPD (chronic obstructive pulmonary disease) ([place])   . Essential hypertension   . Gout   . Helicobacter pylori gastritis 05/2014  . Lung nodule   . Mitral regurgitation   . Multiple duodenal ulcers 05/2014  . Prostate cancer ([place])   . Thyroid nodule     Medications:  Prescriptions Prior to Admission  Medication Sig Dispense Refill Last Dose  . albuterol (PROVENTIL) (2.5 MG/3ML) 0.083% nebulizer solution Take 3 mLs (2.5 mg total) by nebulization every 4 (four) hours as needed for wheezing or shortness of breath. 75 mL 12 unknown  . allopurinol (ZYLOPRIM) 100 MG tablet Take 100 mg by mouth daily.   09/29/2015 at Unknown time  . aspirin 81 MG chewable tablet Chew 1 tablet (81 mg total) by mouth daily. 30 tablet 0 09/29/2015 at Unknown time  . carvedilol (COREG) 6.25 MG tablet Take 1.5 tablets (9.375 mg total) by mouth 2 (two) times daily. 180 tablet 3 09/29/2015 at 800  . Fluticasone-Salmeterol (ADVAIR DISKUS) 250-50 MCG/DOSE AEPB Inhale 1 puff into the lungs daily. 60 each 1 09/29/2015  at Unknown time  . furosemide (LASIX) 40 MG tablet Take 40 mg by mouth daily as needed for edema.   09/29/2015 at Unknown time  . gabapentin (NEURONTIN) 600 MG tablet Take 300 mg by mouth 3 (three) times daily as needed (for pain/neuropathy).    Past Week at Unknown time  . Iron Polysacch Cmplx-B12-FA 150-0.025-1 MG CAPS Take 1 capsule by mouth 2 (two) times daily. 60 each 0 09/29/2015 at Unknown time  . levofloxacin (LEVAQUIN) 750 MG tablet Take 1 tablet (750 mg total) by mouth daily. 3 tablet 0   . pantoprazole (PROTONIX) 40 MG tablet Take 1 tablet (40 mg total) by mouth daily. 60 tablet 5 09/29/2015 at Unknown time  . pravastatin (PRAVACHOL) 40 MG tablet Take 1 tablet by mouth at bedtime.    09/28/2015 at Unknown time  . predniSONE (DELTASONE) 10 MG tablet Take 1 tablet (10 mg total) by mouth daily with breakfast. Take 6 tablets today and then decrease by 1 tablet daily until none are left. 21 tablet 0   . VENTOLIN HFA 108 (90 BASE) MCG/ACT inhaler Inhale 1 puff into the lungs every 6 (six) hours as needed for wheezing or shortness of breath. Reported on 07/02/2015   unknown  . warfarin (COUMADIN) 5 MG tablet Take 5 mg by mouth daily.   09/28/2015 at 2000  . zolpidem (AMBIEN) 10 MG tablet Take 0.5 tablets (5 mg total)  by mouth at bedtime as needed for sleep. 30 tablet 0 09/28/2015 at Unknown time    Assessment: 70 y.o.male,with history of COPD was brought to the hospital by EMS for worsening shortness of breath, no chest pain. Patient has h/o atrial flutter and s/p TAVR in May 2017. Chronically anticoagulated. Continue Coumadin.  INR 2.47 today. Pt take 3.5mg  daily at home.  Goal of Therapy:  INR 2-3 Monitor platelets by anticoagulation protocol: Yes   Plan:  Coumadin 3.5 mg today Daily PT/INR Correct home med rec Monitor for S/S of bleeding  Isac Sarna, BS [place], BCPS Clinical Pharmacist Pager [phone]  11/01/2015,9:08 AM

## 2015-11-02 DIAGNOSIS — E041 Nontoxic single thyroid nodule: Secondary | ICD-10-CM

## 2015-11-02 DIAGNOSIS — R911 Solitary pulmonary nodule: Secondary | ICD-10-CM

## 2015-11-02 DIAGNOSIS — J441 Chronic obstructive pulmonary disease with (acute) exacerbation: Secondary | ICD-10-CM | POA: Diagnosis not present

## 2015-11-02 LAB — COMPREHENSIVE METABOLIC PANEL
ALBUMIN: 3.2 g/dL — AB (ref 3.5–5.0)
ALK PHOS: 80 U/L (ref 38–126)
ALT: 10 U/L — AB (ref 17–63)
ANION GAP: 8 (ref 5–15)
AST: 14 U/L — ABNORMAL LOW (ref 15–41)
BILIRUBIN TOTAL: 0.5 mg/dL (ref 0.3–1.2)
BUN: 29 mg/dL — ABNORMAL HIGH (ref 6–20)
CALCIUM: 8.9 mg/dL (ref 8.9–10.3)
CO2: 34 mmol/L — ABNORMAL HIGH (ref 22–32)
CREATININE: 1.14 mg/dL (ref 0.61–1.24)
Chloride: 95 mmol/L — ABNORMAL LOW (ref 101–111)
GFR calc non Af Amer: >60 mL/min (ref >60)
GLUCOSE: 131 mg/dL — AB (ref 65–99)
Potassium: 4.4 mmol/L (ref 3.5–5.1)
SODIUM: 137 mmol/L (ref 135–145)
TOTAL PROTEIN: 6.5 g/dL (ref 6.5–8.1)

## 2015-11-02 LAB — CBC
HCT: 35 % — ABNORMAL LOW (ref 39.0–52.0)
Hemoglobin: 11.2 g/dL — ABNORMAL LOW (ref 13.0–17.0)
MCH: 29.7 pg (ref 26.0–34.0)
MCHC: 32 g/dL (ref 30.0–36.0)
MCV: 92.8 fL (ref 78.0–100.0)
PLATELETS: 336 10*3/uL (ref 150–400)
RBC: 3.77 MIL/uL — ABNORMAL LOW (ref 4.22–5.81)
RDW: 14 % (ref 11.5–15.5)
WBC: 23.1 10*3/uL — ABNORMAL HIGH (ref 4.0–10.5)

## 2015-11-02 LAB — PROTIME-INR
INR: 2.11
Prothrombin Time: 24 seconds — ABNORMAL HIGH (ref 11.4–15.2)

## 2015-11-02 MED ORDER — PREDNISONE 20 MG PO TABS
60.0000 mg | ORAL_TABLET | Freq: Every day | ORAL | Status: DC
  Administered 2015-11-02 – 2015-11-03 (×2): 60 mg via ORAL
  Filled 2015-11-02 (×2): qty 3

## 2015-11-02 MED ORDER — WARFARIN SODIUM 5 MG PO TABS
3.5000 mg | ORAL_TABLET | Freq: Once | ORAL | Status: AC
  Administered 2015-11-02: 3.5 mg via ORAL
  Filled 2015-11-02: qty 1

## 2015-11-02 NOTE — Progress Notes (Signed)
Patient ID: Ryan Shea, male   DOB: 1945/10/27, 70 y.o.   MRN: [id number]                                                                PROGRESS NOTE                                                                                                                                                                                                             Patient Demographics:    Ryan Shea, is a 69 y.o. male, DOB - 1945-08-02, [id number]  Admit date - 11/01/2015   Admitting Physician Oswald Hillock, MD  Outpatient Primary MD for the patient is [facility]  LOS - 0  Outpatient Specialists  Dr. Cyndia Bent (CT surgery)  Chief Complaint  Patient presents with  . Shortness of Breath       Brief Narrative  70 y.o.male,With history of COPD was brought to the hospital by EMS for worsening shortness of breath. As per the patient's daughter she noted that patient was having labored breathing at home and also is coughing up green colored phlegm.She called EMS and patient was brought to the hospital. On arrival patient's O2 sats was 82% on room air. Patient denies any chest pain, says he feels better. Denies vomiting or diarrhea. Did have nausea today  The ED patient started on Solu-Medrol and given 2 albuterol treatments.   Subjective:    Ryan Shea today feels like his breathing is better. pt denies fever, chills, cp, palp.   Has slight cough dry  No headache, No chest pain, No abdominal pain - No Nausea, No new weakness tingling or numbness,    Assessment  & Plan :    Active Problems:   HTN (hypertension)   COPD exacerbation (HCC)   Thyroid nodule   Lung nodule   1.  Copd exacerbation solumedro 60mg  iv q6h => prednisone 60mg  po qday (11/02/2015) Cont levaquin Cont duoneb Cont mucinex Cont Breo 1puff qday  2. Aflutter Coumadin pharmacy to dose   3. Lung nodule on prior CT scan Will need outpatient follow up  4. Thyroid nodule 1.9x2.6  cm on prior CT scan4/12/2015 Check tsh Check thyroid ultrasound  5. Hypertension Cont carvedilol  6.  Hyperlipidemia Cont pravastatin  7.  Gout Cont allopurinol  8.  Gerd Cont PPI   Code Status : FULL CODE  Family Communication  : w patient  Disposition Plan  : home  Barriers For Discharge :   Consults  :  none  Procedures  : CXR 11/01/2015=> negative  DVT Prophylaxis  :  Coumadin  Lab Results  Component Value Date   PLT 336 11/02/2015    Antibiotics  :  levaquin 8/26=>  Anti-infectives    Start     Dose/Rate Route Frequency Ordered Stop   11/01/15 1000  levofloxacin (LEVAQUIN) tablet 750 mg     750 mg Oral Daily 11/01/15 0352          Objective:   Vitals:   11/01/15 2248 11/02/15 0156 11/02/15 0640 11/02/15 0911  BP: (!) 131/52  (!) 132/54   Pulse: 91  86   Resp: 20  20   Temp: 98.1 F (36.7 C)  98 F (36.7 C)   TempSrc: Axillary  Axillary   SpO2: 92% 96% 98% 96%  Weight:      Height:        Wt Readings from Last 3 Encounters:  11/01/15 111.6 kg (246 lb)  10/01/15 110.7 kg (244 lb 1.1 oz)  08/13/15 110.7 kg (244 lb 1.9 oz)     Intake/Output Summary (Last 24 hours) at 11/02/15 0930 Last data filed at 11/02/15 0900  Gross per 24 hour  Intake              720 ml  Output             1400 ml  Net             -680 ml     Physical Exam  Awake Alert, Oriented X 3, No new F.N deficits, Normal affect .AT,PERRAL Supple Neck,No JVD, No cervical lymphadenopathy appriciated.  Symmetrical Chest wall movement, Good air movement bilaterally, CTAB RRR,No Gallops,Rubs or new Murmurs, No Parasternal Heave +ve B.Sounds, Abd Soft, No tenderness, No organomegaly appriciated, No rebound - guarding or rigidity. No Cyanosis, Clubbing or edema, No new Rash or bruise      Data Review:    CBC  Recent Labs Lab 11/01/15 0148 11/01/15 0456 11/02/15 0549  WBC 16.0* 15.3* 23.1*  HGB 12.2* 11.9* 11.2*  HCT 37.3* 36.9* 35.0*  PLT 309 310  336  MCV 93.0 93.4 92.8  MCH 30.4 30.1 29.7  MCHC 32.7 32.2 32.0  RDW 13.9 13.8 14.0    Chemistries   Recent Labs Lab 11/01/15 0148 11/01/15 0456 11/02/15 0549  NA 137 139 137  K 3.4* 3.8 4.4  CL 96* 94* 95*  CO2 36* 36* 34*  GLUCOSE 139* 158* 131*  BUN 20 21* 29*  CREATININE 1.21 1.13 1.14  CALCIUM 8.6* 9.2 8.9  AST  --  14* 14*  ALT  --  11* 10*  ALKPHOS  --  89 80  BILITOT  --  0.7 0.5   ------------------------------------------------------------------------------------------------------------------ No results for input(s): CHOL, HDL, LDLCALC, TRIG, CHOLHDL, LDLDIRECT in the last 72 hours.  Lab Results  Component Value Date   HGBA1C 5.4 07/04/2015   ------------------------------------------------------------------------------------------------------------------ No results for input(s): TSH, T4TOTAL, T3FREE, THYROIDAB in the last 72 hours.  Invalid input(s): FREET3 ------------------------------------------------------------------------------------------------------------------ No results for input(s): VITAMINB12, FOLATE, FERRITIN, TIBC, IRON, RETICCTPCT in the last 72 hours.  Coagulation profile  Recent Labs Lab 11/01/15 1005 11/02/15 0549  INR 2.47 2.11    No results for input(s): DDIMER in the last 72  hours.  Cardiac Enzymes No results for input(s): CKMB, TROPONINI, MYOGLOBIN in the last 168 hours.  Invalid input(s): CK ------------------------------------------------------------------------------------------------------------------    Component Value Date/Time   BNP 59.0 09/29/2015 1133    Inpatient Medications  Scheduled Meds: . allopurinol  100 mg Oral Daily  . aspirin  81 mg Oral Daily  . carvedilol  9.375 mg Oral BID  . fluticasone furoate-vilanterol  1 puff Inhalation Daily  . guaiFENesin  600 mg Oral BID  . ipratropium-albuterol  2.5 mg Nebulization Q6H  . levofloxacin  750 mg Oral Daily  . mouth rinse  15 mL Mouth Rinse BID  .  pantoprazole  40 mg Oral Daily  . pravastatin  40 mg Oral QHS  . predniSONE  60 mg Oral Q breakfast  . warfarin  3.5 mg Oral Once  . Warfarin - Pharmacist Dosing Inpatient   Does not apply q1800   Continuous Infusions:  PRN Meds:.acetaminophen **OR** acetaminophen, gabapentin, ondansetron **OR** ondansetron (ZOFRAN) IV, zolpidem  Micro Results No results found for this or any previous visit (from the past 240 hour(s)).  Radiology Reports Dg Chest Portable 1 View  Result Date: 11/01/2015 CLINICAL DATA:  Shortness of breath. Wheezing worsening tonight. No chest pain. EXAM: PORTABLE CHEST 1 VIEW COMPARISON:  09/29/2015 FINDINGS: Borderline heart size. Cardiac valve prosthesis. No vascular congestion. No focal airspace disease or consolidation. No blunting of costophrenic angles. No pneumothorax. Calcific aortic atherosclerosis. No change since previous study. IMPRESSION: Cardiac enlargement.  No evidence of active pulmonary disease. Electronically Signed   By: Lucienne Capers M.D.   On: 11/01/2015 01:41    Time Spent in minutes  30   Jani Gravel M.D on 11/02/2015 at 9:30 AM  Between 7am to 7pm - Pager - [phone]  After 7pm go to [email or web address] - password [email or web address]  [facility] -  Office  [phone]

## 2015-11-02 NOTE — Progress Notes (Signed)
ANTICOAGULATION CONSULT NOTE - Initial Consult  Pharmacy Consult for Coumadin Indication: Atrial flutter/TAVR  Allergies  Allergen Reactions  . Gabapentin Other (See Comments)    Capsule causes bad heartburn    Patient Measurements: Height: 5\' 6"  (167.6 cm) Weight: 246 lb (111.6 kg) IBW/kg (Calculated) : 63.8  Vital Signs: Temp: 98 F (36.7 C) (08/27 0640) Temp Source: Axillary (08/27 0640) BP: 132/54 (08/27 0640) Pulse Rate: 86 (08/27 0640)  Labs:  Recent Labs  11/01/15 0148 11/01/15 0456 11/01/15 1005 11/02/15 0549  HGB 12.2* 11.9*  --  11.2*  HCT 37.3* 36.9*  --  35.0*  PLT 309 310  --  336  LABPROT  --   --  27.2* 24.0*  INR  --   --  2.47 2.11  CREATININE 1.21 1.13  --  1.14    Estimated Creatinine Clearance: 69.7 mL/min (by C-G formula based on SCr of 1.14 mg/dL).   Medical History: Past Medical History:  Diagnosis Date  . 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 Angelena Form  . Asthma   . Atrial flutter ([place])   . Chronic anemia   . CKD (chronic kidney disease) stage 3, GFR 30-59 ml/min   . COPD (chronic obstructive pulmonary disease) ([place])   . Essential hypertension   . Gout   . Helicobacter pylori gastritis 05/2014  . Lung nodule   . Mitral regurgitation   . Multiple duodenal ulcers 05/2014  . Prostate cancer ([place])   . Thyroid nodule     Medications:  Prescriptions Prior to Admission  Medication Sig Dispense Refill Last Dose  . albuterol (PROVENTIL) (2.5 MG/3ML) 0.083% nebulizer solution Take 3 mLs (2.5 mg total) by nebulization every 4 (four) hours as needed for wheezing or shortness of breath. 75 mL 12 10/31/2015 at Unknown time  . allopurinol (ZYLOPRIM) 100 MG tablet Take 100 mg by mouth daily.   10/31/2015 at Unknown time  . aspirin 81 MG chewable tablet Chew 1 tablet (81 mg total) by mouth daily. 30 tablet 0 10/31/2015 at Unknown time  . carvedilol (COREG) 6.25 MG tablet Take 1.5 tablets (9.375 mg total) by mouth 2 (two) times daily.  180 tablet 3 10/31/2015 at 1900  . Fluticasone-Salmeterol (ADVAIR DISKUS) 250-50 MCG/DOSE AEPB Inhale 1 puff into the lungs daily. 60 each 1 10/31/2015 at Unknown time  . furosemide (LASIX) 40 MG tablet Take 40 mg by mouth daily as needed for edema.   10/31/2015 at Unknown time  . gabapentin (NEURONTIN) 600 MG tablet Take 300 mg by mouth 3 (three) times daily as needed (for pain/neuropathy).    10/31/2015 at Unknown time  . Iron Polysacch Cmplx-B12-FA 150-0.025-1 MG CAPS Take 1 capsule by mouth 2 (two) times daily. 60 each 0 10/31/2015 at Unknown time  . pantoprazole (PROTONIX) 40 MG tablet Take 1 tablet (40 mg total) by mouth daily. 60 tablet 5 10/31/2015 at Unknown time  . pravastatin (PRAVACHOL) 40 MG tablet Take 1 tablet by mouth at bedtime.    10/31/2015 at Unknown time  . VENTOLIN HFA 108 (90 BASE) MCG/ACT inhaler Inhale 1 puff into the lungs every 6 (six) hours as needed for wheezing or shortness of breath. Reported on 07/02/2015   10/31/2015 at Unknown time  . warfarin (COUMADIN) 1 MG tablet Take 0.5 mg by mouth at bedtime. Takes with one 3 mg tablet.   10/31/2015 at 1900  . warfarin (COUMADIN) 3 MG tablet Take 3 mg by mouth at bedtime. Takes with half of  a 1 mg tablet   10/31/2015 at 1900  . zolpidem (AMBIEN) 10 MG tablet Take 0.5 tablets (5 mg total) by mouth at bedtime as needed for sleep. 30 tablet 0 10/31/2015 at Unknown time    Assessment: 70 y.o.male,with history of COPD was brought to the hospital by EMS for worsening shortness of breath, no chest pain. Patient has h/o atrial flutter and s/p TAVR in May 2017. Chronically anticoagulated. Continue Coumadin.  INR 2.11 today. Continue with home dose 3.5mg  daily. CBC stable, no bleeding noted.  Goal of Therapy:  INR 2-3 Monitor platelets by anticoagulation protocol: Yes   Plan:  Coumadin 3.5 mg today Daily PT/INR Monitor for S/S of bleeding  Isac Sarna, BS [place], BCPS Clinical Pharmacist Pager [phone]  11/02/2015,9:11 AM

## 2015-11-03 DIAGNOSIS — J441 Chronic obstructive pulmonary disease with (acute) exacerbation: Secondary | ICD-10-CM | POA: Diagnosis not present

## 2015-11-03 DIAGNOSIS — E041 Nontoxic single thyroid nodule: Secondary | ICD-10-CM | POA: Diagnosis not present

## 2015-11-03 DIAGNOSIS — R911 Solitary pulmonary nodule: Secondary | ICD-10-CM | POA: Diagnosis not present

## 2015-11-03 LAB — COMPREHENSIVE METABOLIC PANEL
ALK PHOS: 68 U/L (ref 38–126)
ALT: 11 U/L — ABNORMAL LOW (ref 17–63)
ANION GAP: 6 (ref 5–15)
AST: 15 U/L (ref 15–41)
Albumin: 3 g/dL — ABNORMAL LOW (ref 3.5–5.0)
BILIRUBIN TOTAL: 0.5 mg/dL (ref 0.3–1.2)
BUN: 33 mg/dL — ABNORMAL HIGH (ref 6–20)
CALCIUM: 8.7 mg/dL — AB (ref 8.9–10.3)
CO2: 35 mmol/L — ABNORMAL HIGH (ref 22–32)
Chloride: 96 mmol/L — ABNORMAL LOW (ref 101–111)
Creatinine, Ser: 1.23 mg/dL (ref 0.61–1.24)
GFR, EST NON AFRICAN AMERICAN: 57 mL/min — AB (ref >60)
Glucose, Bld: 110 mg/dL — ABNORMAL HIGH (ref 65–99)
POTASSIUM: 4.3 mmol/L (ref 3.5–5.1)
Sodium: 137 mmol/L (ref 135–145)
TOTAL PROTEIN: 5.7 g/dL — AB (ref 6.5–8.1)

## 2015-11-03 LAB — CBC
HEMATOCRIT: 33.4 % — AB (ref 39.0–52.0)
HEMOGLOBIN: 10.9 g/dL — AB (ref 13.0–17.0)
MCH: 30.4 pg (ref 26.0–34.0)
MCHC: 32.6 g/dL (ref 30.0–36.0)
MCV: 93.3 fL (ref 78.0–100.0)
Platelets: 276 10*3/uL (ref 150–400)
RBC: 3.58 MIL/uL — ABNORMAL LOW (ref 4.22–5.81)
RDW: 14 % (ref 11.5–15.5)
WBC: 23.4 10*3/uL — ABNORMAL HIGH (ref 4.0–10.5)

## 2015-11-03 MED ORDER — LEVOFLOXACIN 750 MG PO TABS
750.0000 mg | ORAL_TABLET | Freq: Every day | ORAL | 0 refills | Status: DC
Start: 2015-11-03 — End: 2015-12-12

## 2015-11-03 MED ORDER — PREDNISONE 10 MG PO TABS: 60.0000 mg | ORAL_TABLET | Freq: Every day | ORAL | 0 refills | Status: DC

## 2015-11-03 NOTE — Progress Notes (Signed)
Reviewed discharge information with pt and answered all questions at this time.  Pt discharged home with daughter.

## 2015-11-03 NOTE — Discharge Summary (Signed)
Ryan Shea, is a 70 y.o. male  DOB 12-Nov-1945  MRN [id number].  Admission date:  11/01/2015  Admitting Physician  Oswald Hillock, MD  Discharge Date:  11/03/2015   Primary [facility]  Recommendations for primary care physician for things to follow:    1. Copd exacerbation solumedro 60mg  iv q6h => prednisone 60mg  po qday (11/02/2015) Prednisone 60mg  po qday x1 d then 50mg  po qday x 2d then 40mg  po qday x2d then 30mg  po qday x2d then 20mg  po qday x2d then 10mg  po qday x2d Cont levaquin Cont Advair Cont Albuterol HFA Pox 88% at nite, will try to arrange home o2 at nite.    2. Aflutter Cont coumadin, will need repeat INR in 3-5 days.   3. Lung nodule on prior CT scan Will need outpatient follow up  4. Thyroid nodule 1.9x2.6 cm on prior CT scan4/12/2015 Check tsh Please arrange Thyroid nodule biopsy.   5. Hypertension Cont carvedilol  6. Hyperlipidemia Cont pravastatin  7. Gout Cont allopurinol  8. Gerd Cont PPI   Admission Diagnosis  COPD exacerbation ([place]) [J44.1]   Discharge Diagnosis  COPD exacerbation ([place]) [J44.1]    Active Problems:   HTN (hypertension)   COPD exacerbation (HCC)   Thyroid nodule   Lung nodule      Past Medical History:  Diagnosis Date  . 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 Angelena Form  . Asthma   . Atrial flutter ([place])   . Chronic anemia   . CKD (chronic kidney disease) stage 3, GFR 30-59 ml/min   . COPD (chronic obstructive pulmonary disease) ([place])   . Essential hypertension   . Gout   . Helicobacter pylori gastritis 05/2014  . Lung nodule   . Mitral regurgitation   . Multiple duodenal ulcers 05/2014  . Prostate cancer ([place])   . Thyroid nodule     Past Surgical History:  Procedure Laterality Date  . CARDIAC CATHETERIZATION N/A 03/26/2015   Procedure: Right/Left Heart Cath and  Coronary Angiography;  Surgeon: Burnell Blanks, MD;  Location: [facility];  Service: Cardiovascular;  Laterality: N/A;  . COLONOSCOPY    . COLONOSCOPY N/A 08/25/2014   Rehman:examination performed to cecum. moderate number of diverticula at sigmoid colon without stigmata of bleed small external hemorrhoids and anal papillae  . ESOPHAGOGASTRODUODENOSCOPY N/A 05/31/2014   H PYLORI GASTRITIS, DUODENAL ULCERS  . GIVENS CAPSULE STUDY N/A 04/18/2015   Procedure: GIVENS CAPSULE STUDY;  Surgeon: Danie Binder, MD;  [facility];  Service: Endoscopy;  Laterality: N/A;  . HERNIA REPAIR    . PROSTATE SURGERY    . TEE WITHOUT CARDIOVERSION N/A 07/08/2015   Procedure: TRANSESOPHAGEAL ECHOCARDIOGRAM (TEE);  Surgeon: Burnell Blanks, MD;  Location: [facility];  Service: Open Heart Surgery;  Laterality: N/A;  . TRANSCATHETER AORTIC VALVE REPLACEMENT, TRANSFEMORAL N/A 07/08/2015   Procedure: TRANSCATHETER AORTIC VALVE REPLACEMENT, TRANSFEMORAL;  Surgeon: Burnell Blanks, MD;  Location: [facility];  Service: Open Heart Surgery;  Laterality: N/A;       HPI  from the history and physical done on the day of admission:     70 y.o.male,With history of COPD was brought to the hospital by EMS for worsening shortness of breath. As per the patient's daughter she noted that patient was having labored breathing at home and also is coughing up green colored phlegm.She called EMS and patient was brought to the hospital. On arrival patient's O2 sats was 82% on room air. Patient denies any chest pain, says he feels better. Denies vomiting or diarrhea. Did have nausea today  The ED patient started on Solu-Medrol and given 2 albuterol treatments.     Hospital Course:     Pt was tx for copd exacerbation with duoneb and solumedrol and levaquin,  Pt improved and on 8/27 pt transitioned over to prednisone 60mg  po qday.  Breathing continued to improve.  Due to Pox 88% at nite at times, will  request home o2 at nite. Pt appears stable and will be discharged on prednisone taper and levaquin in addition to his routine inhalers.     Follow UP  [facility] .   Contact information: [address] [place] [address] [phone]            Consults obtained -   Discharge Condition:   Diet and Activity recommendation: See Discharge Instructions below  Discharge Instructions         Discharge Medications       Medication List    TAKE these medications   allopurinol 100 MG tablet Commonly known as:  ZYLOPRIM Take 100 mg by mouth daily.   aspirin 81 MG chewable tablet Chew 1 tablet (81 mg total) by mouth daily.   carvedilol 6.25 MG tablet Commonly known as:  COREG Take 1.5 tablets (9.375 mg total) by mouth 2 (two) times daily.   Fluticasone-Salmeterol 250-50 MCG/DOSE Aepb Commonly known as:  ADVAIR DISKUS Inhale 1 puff into the lungs daily.   furosemide 40 MG tablet Commonly known as:  LASIX Take 40 mg by mouth daily as needed for edema.   gabapentin 600 MG tablet Commonly known as:  NEURONTIN Take 300 mg by mouth 3 (three) times daily as needed (for pain/neuropathy).   Iron Polysacch Cmplx-B12-FA 150-0.025-1 MG Caps Take 1 capsule by mouth 2 (two) times daily.   levofloxacin 750 MG tablet Commonly known as:  LEVAQUIN Take 1 tablet (750 mg total) by mouth daily.   pantoprazole 40 MG tablet Commonly known as:  PROTONIX Take 1 tablet (40 mg total) by mouth daily.   pravastatin 40 MG tablet Commonly known as:  PRAVACHOL Take 1 tablet by mouth at bedtime.   predniSONE 10 MG tablet Commonly known as:  DELTASONE Take 6 tablets (60 mg total) by mouth daily with breakfast. x 1 day then 50mg  po qday x 2 days then 40mg  po qday x 2 days then 30mg  po qday x 2 days then 20mg  po qday x 2 days then 10mg  po qday x 2 days.   VENTOLIN HFA 108 (90 Base) MCG/ACT inhaler Generic drug:  albuterol Inhale 1  puff into the lungs every 6 (six) hours as needed for wheezing or shortness of breath. Reported on 07/02/2015   albuterol (2.5 MG/3ML) 0.083% nebulizer solution Commonly known as:  PROVENTIL Take 3 mLs (2.5 mg total) by nebulization every 4 (four) hours as needed for  wheezing or shortness of breath.   warfarin 3 MG tablet Commonly known as:  COUMADIN Take 3 mg by mouth at bedtime. Takes with half of a 1 mg tablet   warfarin 1 MG tablet Commonly known as:  COUMADIN Take 0.5 mg by mouth at bedtime. Takes with one 3 mg tablet.   zolpidem 10 MG tablet Commonly known as:  AMBIEN Take 0.5 tablets (5 mg total) by mouth at bedtime as needed for sleep.       Major procedures and Radiology Reports - PLEASE review detailed and final reports for all details, in brief -      US Soft Tissue Head/neck  Result Date: 11/02/2015 CLINICAL DATA:  Right nodule noted on CT chest EXAM: THYROID ULTRASOUND TECHNIQUE: Ultrasound examination of the thyroid gland and adjacent soft tissues was performed. COMPARISON:  CT 06/11/2015 FINDINGS: Right thyroid lobe Measurements: 5.7 x 3.6 x 2.2 cm. Heterogeneous background echotexture. 2.8 x 2.9 x 3.2 cm solid nodule, inferior pole. Left thyroid lobe Measurements: 4.9 x 2.2 x 1.6 cm. Somewhat poorly marginated 2.4 x 2.3 x 2.1 cm hypoechoic solid nodule, inferior pole. Isthmus Thickness: 1 cm.  No nodules visualized. Lymphadenopathy None visualized. IMPRESSION: 1. Mild thyromegaly with bilateral nodules. Both meet consensus criteria for biopsy. Ultrasound-guided fine needle aspiration should be considered, as per the consensus statement: Management of Thyroid Nodules Detected at [place]: Society of Radiologists in [place]. Radiology 2005; [id number]. Electronically Signed   By: Lucrezia Europe M.D.   On: 11/02/2015 11:07   Dg Chest Portable 1 View  Result Date: 11/01/2015 CLINICAL DATA:  Shortness of breath. Wheezing worsening tonight. No chest  pain. EXAM: PORTABLE CHEST 1 VIEW COMPARISON:  09/29/2015 FINDINGS: Borderline heart size. Cardiac valve prosthesis. No vascular congestion. No focal airspace disease or consolidation. No blunting of costophrenic angles. No pneumothorax. Calcific aortic atherosclerosis. No change since previous study. IMPRESSION: Cardiac enlargement.  No evidence of active pulmonary disease. Electronically Signed   By: Lucienne Capers M.D.   On: 11/01/2015 01:41    Micro Results     No results found for this or any previous visit (from the past 240 hour(s)).     Today   Subjective    Ryan Shea today states breathing better.     no headache,no chest abdominal pain,no new weakness tingling or numbness, feels much better wants to go home today.    Objective   Blood pressure 114/69, pulse 81, temperature 97.8 F (36.6 C), temperature source Oral, resp. rate 18, height 5\' 6"  (1.676 m), weight 111.6 kg (246 lb), SpO2 100 %. POX 88% 7am 8/28 on RA  Intake/Output Summary (Last 24 hours) at 11/03/15 0820 Last data filed at 11/03/15 0257  Gross per 24 hour  Intake              480 ml  Output              850 ml  Net             -370 ml    Exam Awake Alert, Oriented x 3, No new F.N deficits, Normal affect .AT,PERRAL Supple Neck,No JVD, No cervical lymphadenopathy appriciated.  Symmetrical Chest wall movement, Good air movement bilaterally, very faint exp wheeze, no crackes.  RRR,No Gallops,Rubs or new Murmurs, No Parasternal Heave +ve B.Sounds, Abd Soft, Non tender, No organomegaly appriciated, No rebound -guarding or rigidity. No Cyanosis, Clubbing or edema, No new Rash or bruise   Data Review   CBC  w Diff: Lab Results  Component Value Date   WBC 23.4 (H) 11/03/2015   HGB 10.9 (L) 11/03/2015   HCT 33.4 (L) 11/03/2015   PLT 276 11/03/2015   LYMPHOPCT 17 09/29/2015   MONOPCT 6 09/29/2015   EOSPCT 4 09/29/2015   BASOPCT 1 09/29/2015    CMP: Lab Results  Component Value Date   NA  137 11/03/2015   K 4.3 11/03/2015   CL 96 (L) 11/03/2015   CO2 35 (H) 11/03/2015   BUN 33 (H) 11/03/2015   CREATININE 1.23 11/03/2015   CREATININE 1.07 06/09/2015   PROT 5.7 (L) 11/03/2015   ALBUMIN 3.0 (L) 11/03/2015   BILITOT 0.5 11/03/2015   ALKPHOS 68 11/03/2015   AST 15 11/03/2015   ALT 11 (L) 11/03/2015  .   Total Time in preparing paper work, data evaluation and todays exam - 62 minutes  Jani Gravel M.D on 11/03/2015 at 8:20 AM  [facility]   Office  [phone]

## 2015-11-03 NOTE — Care Management CC44 (Signed)
Condition Code 44 Documentation Completed  Patient Details  Name: VENCENT HENSCHEN MRN: [id number] Date of Birth: Dec 12, 1945   Condition Code 44 given:  Yes Patient signature on Condition Code 44 notice:  Yes Documentation of 2 MD's agreement:  Yes Code 44 added to claim:  Yes    Sherald Barge, RN 11/03/2015, 11:23 AM

## 2015-11-03 NOTE — Care Management Note (Signed)
Case Management Note  Patient Details  Name: Ryan Shea MRN: [id number] Date of Birth: 26-Jul-1945  Subjective/Objective:                  Pt admitted with COPD. He is from home, lives with his wife and is ind with ADL's. He does not meet requirements for continuous supplemental O2 but wil be referred for overnight oximetry and HS oxygen if he meets requirements. He has chosen [facility] from list of DME providers. Romualdo Bolk, of [facility] is aware of referral and will obtain pt info from chart.   Action/Plan: He plans to DC home today with self care.    Expected Discharge Date:     11/03/2015             Expected Discharge Plan:  Home/Self Care  In-House Referral:  NA  Discharge planning Services  CM Consult  Post Acute Care Choice:  Durable Medical Equipment, Home Health Choice offered to:  Patient  DME Arranged:  Oxygen DME Agency:  [place]:  Respirator Therapy [place] Agency:  [facility]  Status of Service:  Completed, signed off  If discussed at [place] of Stay Meetings, dates discussed:    Additional Comments:  Sherald Barge, RN 11/03/2015, 11:26 AM

## 2015-12-11 ENCOUNTER — Observation Stay (HOSPITAL_COMMUNITY)
Admission: EM | Admit: 2015-12-11 | Discharge: 2015-12-12 | Disposition: A | Discharge status: Home / Self Care | Payer: Medicare PPO | Source: Home / Self Care | Attending: Emergency Medicine | Admitting: Emergency Medicine

## 2015-12-11 ENCOUNTER — Encounter (HOSPITAL_COMMUNITY): Payer: Self-pay | Admitting: *Deleted

## 2015-12-11 ENCOUNTER — Ambulatory Visit (HOSPITAL_COMMUNITY): Payer: Medicare PPO | Admitting: Hematology & Oncology

## 2015-12-11 ENCOUNTER — Emergency Department (HOSPITAL_COMMUNITY): Payer: Medicare PPO

## 2015-12-11 ENCOUNTER — Other Ambulatory Visit (HOSPITAL_COMMUNITY): Payer: Medicare PPO

## 2015-12-11 DIAGNOSIS — J45909 Unspecified asthma, uncomplicated: Secondary | ICD-10-CM

## 2015-12-11 DIAGNOSIS — Z87891 Personal history of nicotine dependence: Secondary | ICD-10-CM | POA: Insufficient documentation

## 2015-12-11 DIAGNOSIS — Z8546 Personal history of malignant neoplasm of prostate: Secondary | ICD-10-CM

## 2015-12-11 DIAGNOSIS — D649 Anemia, unspecified: Secondary | ICD-10-CM | POA: Diagnosis present

## 2015-12-11 DIAGNOSIS — N183 Chronic kidney disease, stage 3 (moderate): Secondary | ICD-10-CM | POA: Insufficient documentation

## 2015-12-11 DIAGNOSIS — J441 Chronic obstructive pulmonary disease with (acute) exacerbation: Secondary | ICD-10-CM

## 2015-12-11 DIAGNOSIS — Z79899 Other long term (current) drug therapy: Secondary | ICD-10-CM | POA: Insufficient documentation

## 2015-12-11 DIAGNOSIS — I10 Essential (primary) hypertension: Secondary | ICD-10-CM

## 2015-12-11 DIAGNOSIS — I129 Hypertensive chronic kidney disease with stage 1 through stage 4 chronic kidney disease, or unspecified chronic kidney disease: Secondary | ICD-10-CM

## 2015-12-11 DIAGNOSIS — Z7901 Long term (current) use of anticoagulants: Secondary | ICD-10-CM

## 2015-12-11 DIAGNOSIS — K219 Gastro-esophageal reflux disease without esophagitis: Secondary | ICD-10-CM | POA: Diagnosis present

## 2015-12-11 DIAGNOSIS — Z952 Presence of prosthetic heart valve: Secondary | ICD-10-CM

## 2015-12-11 DIAGNOSIS — J9601 Acute respiratory failure with hypoxia: Secondary | ICD-10-CM | POA: Diagnosis not present

## 2015-12-11 DIAGNOSIS — Z7982 Long term (current) use of aspirin: Secondary | ICD-10-CM

## 2015-12-11 DIAGNOSIS — D509 Iron deficiency anemia, unspecified: Secondary | ICD-10-CM

## 2015-12-11 LAB — BASIC METABOLIC PANEL
ANION GAP: 5 (ref 5–15)
BUN: 17 mg/dL (ref 6–20)
CALCIUM: 9.1 mg/dL (ref 8.9–10.3)
CO2: 35 mmol/L — ABNORMAL HIGH (ref 22–32)
Chloride: 100 mmol/L — ABNORMAL LOW (ref 101–111)
Creatinine, Ser: 1.13 mg/dL (ref 0.61–1.24)
Glucose, Bld: 162 mg/dL — ABNORMAL HIGH (ref 65–99)
Potassium: 3.9 mmol/L (ref 3.5–5.1)
SODIUM: 140 mmol/L (ref 135–145)

## 2015-12-11 LAB — PROTIME-INR
INR: 2.84
PROTHROMBIN TIME: 30.5 s — AB (ref 11.4–15.2)

## 2015-12-11 LAB — CBC
HCT: 38.2 % — ABNORMAL LOW (ref 39.0–52.0)
HEMOGLOBIN: 12 g/dL — AB (ref 13.0–17.0)
MCH: 29.3 pg (ref 26.0–34.0)
MCHC: 31.4 g/dL (ref 30.0–36.0)
MCV: 93.2 fL (ref 78.0–100.0)
Platelets: 372 10*3/uL (ref 150–400)
RBC: 4.1 MIL/uL — AB (ref 4.22–5.81)
RDW: 13.9 % (ref 11.5–15.5)
WBC: 13.7 10*3/uL — ABNORMAL HIGH (ref 4.0–10.5)

## 2015-12-11 MED ORDER — IPRATROPIUM BROMIDE 0.02 % IN SOLN: 0.5000 mg | Freq: Four times a day (QID) | RESPIRATORY_TRACT | Status: DC

## 2015-12-11 MED ORDER — PANTOPRAZOLE SODIUM 40 MG PO TBEC
40.0000 mg | DELAYED_RELEASE_TABLET | Freq: Every day | ORAL | Status: DC
  Administered 2015-12-12: 40 mg via ORAL
  Filled 2015-12-11: qty 1

## 2015-12-11 MED ORDER — IPRATROPIUM BROMIDE 0.02 % IN SOLN
0.5000 mg | Freq: Once | RESPIRATORY_TRACT | Status: AC
Start: 2015-12-11 — End: 2015-12-11
  Administered 2015-12-11: 0.5 mg via RESPIRATORY_TRACT

## 2015-12-11 MED ORDER — CARVEDILOL 3.125 MG PO TABS
9.3750 mg | ORAL_TABLET | Freq: Two times a day (BID) | ORAL | Status: DC
  Administered 2015-12-12: 9.375 mg via ORAL
  Filled 2015-12-11 (×2): qty 3

## 2015-12-11 MED ORDER — IPRATROPIUM-ALBUTEROL 0.5-2.5 (3) MG/3ML IN SOLN
3.0000 mL | Freq: Four times a day (QID) | RESPIRATORY_TRACT | Status: DC
  Administered 2015-12-12 (×2): 3 mL via RESPIRATORY_TRACT
  Filled 2015-12-11 (×2): qty 3

## 2015-12-11 MED ORDER — L-METHYLFOLATE-B6-B12 3-35-2 MG PO TABS
1.0000 | ORAL_TABLET | Freq: Every day | ORAL | Status: DC
  Administered 2015-12-12: 1 via ORAL
  Filled 2015-12-11 (×5): qty 1

## 2015-12-11 MED ORDER — PREDNISONE 10 MG PO TABS
60.0000 mg | ORAL_TABLET | Freq: Once | ORAL | Status: AC
  Administered 2015-12-11: 60 mg via ORAL
  Filled 2015-12-11: qty 1

## 2015-12-11 MED ORDER — BUDESONIDE 0.5 MG/2ML IN SUSP
0.5000 mg | Freq: Two times a day (BID) | RESPIRATORY_TRACT | Status: DC
  Administered 2015-12-11 – 2015-12-12 (×2): 0.5 mg via RESPIRATORY_TRACT
  Filled 2015-12-11 (×2): qty 2

## 2015-12-11 MED ORDER — DOXYCYCLINE HYCLATE 100 MG PO CAPS: 100.0000 mg | ORAL_CAPSULE | Freq: Two times a day (BID) | ORAL | Status: DC

## 2015-12-11 MED ORDER — WARFARIN SODIUM 1 MG PO TABS
3.5000 mg | ORAL_TABLET | Freq: Once | ORAL | Status: AC
  Administered 2015-12-11: 3.5 mg via ORAL
  Filled 2015-12-11: qty 1

## 2015-12-11 MED ORDER — GABAPENTIN 300 MG PO CAPS: 300.0000 mg | ORAL_CAPSULE | Freq: Three times a day (TID) | ORAL | Status: DC | PRN

## 2015-12-11 MED ORDER — ASPIRIN 81 MG PO CHEW
81.0000 mg | CHEWABLE_TABLET | Freq: Every day | ORAL | Status: DC
  Administered 2015-12-12: 81 mg via ORAL
  Filled 2015-12-11: qty 1

## 2015-12-11 MED ORDER — DOXYCYCLINE HYCLATE 100 MG PO TABS
100.0000 mg | ORAL_TABLET | Freq: Two times a day (BID) | ORAL | Status: DC
  Administered 2015-12-11 – 2015-12-12 (×2): 100 mg via ORAL
  Filled 2015-12-11 (×2): qty 1

## 2015-12-11 MED ORDER — ALLOPURINOL 100 MG PO TABS
100.0000 mg | ORAL_TABLET | Freq: Every day | ORAL | Status: DC
  Administered 2015-12-11 – 2015-12-12 (×2): 100 mg via ORAL
  Filled 2015-12-11 (×2): qty 1

## 2015-12-11 MED ORDER — PRAVASTATIN SODIUM 40 MG PO TABS
40.0000 mg | ORAL_TABLET | Freq: Every day | ORAL | Status: DC
  Administered 2015-12-11: 40 mg via ORAL
  Filled 2015-12-11: qty 1

## 2015-12-11 MED ORDER — FERROUS SULFATE 325 (65 FE) MG PO TABS
325.0000 mg | ORAL_TABLET | Freq: Every day | ORAL | Status: DC
  Administered 2015-12-12: 325 mg via ORAL
  Filled 2015-12-11: qty 1

## 2015-12-11 MED ORDER — ONDANSETRON HCL 4 MG/2ML IJ SOLN: 4.0000 mg | Freq: Four times a day (QID) | INTRAMUSCULAR | Status: DC | PRN

## 2015-12-11 MED ORDER — WARFARIN - PHARMACIST DOSING INPATIENT: Status: DC

## 2015-12-11 MED ORDER — ACETAMINOPHEN 650 MG RE SUPP: 650.0000 mg | Freq: Four times a day (QID) | RECTAL | Status: DC | PRN

## 2015-12-11 MED ORDER — ACETAMINOPHEN 325 MG PO TABS: 650.0000 mg | ORAL_TABLET | Freq: Four times a day (QID) | ORAL | Status: DC | PRN

## 2015-12-11 MED ORDER — ALBUTEROL SULFATE (2.5 MG/3ML) 0.083% IN NEBU: 2.5000 mg | INHALATION_SOLUTION | Freq: Four times a day (QID) | RESPIRATORY_TRACT | Status: DC

## 2015-12-11 MED ORDER — ARFORMOTEROL TARTRATE 15 MCG/2ML IN NEBU
15.0000 ug | INHALATION_SOLUTION | Freq: Two times a day (BID) | RESPIRATORY_TRACT | Status: DC
  Administered 2015-12-11 – 2015-12-12 (×2): 15 ug via RESPIRATORY_TRACT
  Filled 2015-12-11 (×2): qty 2

## 2015-12-11 MED ORDER — ALBUTEROL SULFATE (2.5 MG/3ML) 0.083% IN NEBU
5.0000 mg | INHALATION_SOLUTION | Freq: Once | RESPIRATORY_TRACT | Status: AC
  Administered 2015-12-11: 5 mg via RESPIRATORY_TRACT
  Filled 2015-12-11: qty 6

## 2015-12-11 MED ORDER — ONDANSETRON HCL 4 MG PO TABS: 4.0000 mg | ORAL_TABLET | Freq: Four times a day (QID) | ORAL | Status: DC | PRN

## 2015-12-11 MED ORDER — FUROSEMIDE 40 MG PO TABS: 40.0000 mg | ORAL_TABLET | Freq: Every day | ORAL | Status: DC | PRN

## 2015-12-11 MED ORDER — ZOLPIDEM TARTRATE 5 MG PO TABS
5.0000 mg | ORAL_TABLET | Freq: Every evening | ORAL | Status: DC | PRN
Start: 2015-12-11 — End: 2015-12-12
  Administered 2015-12-11: 5 mg via ORAL
  Filled 2015-12-11: qty 1

## 2015-12-11 MED ORDER — GUAIFENESIN ER 600 MG PO TB12
600.0000 mg | ORAL_TABLET | Freq: Two times a day (BID) | ORAL | Status: DC
  Administered 2015-12-11 – 2015-12-12 (×2): 600 mg via ORAL
  Filled 2015-12-11 (×2): qty 1

## 2015-12-11 MED ORDER — IRON POLYSACCH CMPLX-B12-FA 150-0.025-1 MG PO CAPS: 1.0000 | ORAL_CAPSULE | Freq: Two times a day (BID) | ORAL | Status: DC

## 2015-12-11 MED ORDER — PREDNISONE 20 MG PO TABS
60.0000 mg | ORAL_TABLET | Freq: Every day | ORAL | Status: DC
  Administered 2015-12-12: 60 mg via ORAL
  Filled 2015-12-11: qty 3

## 2015-12-11 MED ORDER — ALBUTEROL SULFATE (2.5 MG/3ML) 0.083% IN NEBU: 2.5000 mg | INHALATION_SOLUTION | RESPIRATORY_TRACT | Status: DC | PRN

## 2015-12-11 MED ORDER — ALBUTEROL SULFATE (2.5 MG/3ML) 0.083% IN NEBU
5.0000 mg | INHALATION_SOLUTION | Freq: Once | RESPIRATORY_TRACT | Status: AC
Start: 2015-12-11 — End: 2015-12-11
  Administered 2015-12-11: 5 mg via RESPIRATORY_TRACT
  Filled 2015-12-11: qty 6

## 2015-12-11 NOTE — H&P (Addendum)
History and Physical    KAHLE REASER [id number] DOB: 1945-11-16 DOA: 12/11/2015  Referring MD/NP/PA: Dr. Alvino Chapel PCP: [facility]  Patient coming from: Home  Chief Complaint: Shortness of breath  HPI: Ryan Shea is a 70 y.o. male with medical history significant of COPD, s/p TAVR in 07/2015 on chronic anticoagulation, and anemia; who presents with complaints of shortness of breath. Symptoms started yesterday afternoon. Patient complains of shortness of breath with associated symptoms of wheezing and productive cough with green sputum production. Patient tried using home nebulizer treatments with temporary relief of symptoms. He reports trying 3 nebulized treatments prior to coming in today. Denies any fever, chills, leg swelling, chest pain, diarrhea, abdominal pain, nausea, vomiting, or recent sick contacts. Patient notes during his last hospitalization he was evaluated for need of oxygen, but never received oxygen at home. He states that they brought in. tanks and he may have refused them at that time. Per review of records the as patient has been admitted in 10/2014 and 09/2015 for similar complaints. Patient notes that he still needs to set up appointment with a pulmonologist.  ED Course: Upon admission patient was evaluated and seen to be afebrile with O2 saturations as low as 88% on room air at rest. Lab work revealed WBC 13.7, hemoglobin 12, platelets 372, INR 2.84, BMP similar to previous admissions. Chest x-ray showing stable cardiomegaly. She was given multiple breathing treatments in the ED with continued wheezing. [facility] called to admit  Review of Systems: As per HPI otherwise 10 point review of systems negative.   Past Medical History:  Diagnosis Date  . 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 Angelena Form  . Asthma   . Atrial flutter ([place])   . Chronic anemia   . CKD (chronic kidney disease) stage 3, GFR 30-59 ml/min   . COPD (chronic  obstructive pulmonary disease) ([place])   . Essential hypertension   . Gout   . Helicobacter pylori gastritis 05/2014  . Lung nodule   . Mitral regurgitation   . Multiple duodenal ulcers 05/2014  . Prostate cancer ([place])   . Thyroid nodule     Past Surgical History:  Procedure Laterality Date  . CARDIAC CATHETERIZATION N/A 03/26/2015   Procedure: Right/Left Heart Cath and Coronary Angiography;  Surgeon: Burnell Blanks, MD;  Location: [facility];  Service: Cardiovascular;  Laterality: N/A;  . COLONOSCOPY    . COLONOSCOPY N/A 08/25/2014   Rehman:examination performed to cecum. moderate number of diverticula at sigmoid colon without stigmata of bleed small external hemorrhoids and anal papillae  . ESOPHAGOGASTRODUODENOSCOPY N/A 05/31/2014   H PYLORI GASTRITIS, DUODENAL ULCERS  . GIVENS CAPSULE STUDY N/A 04/18/2015   Procedure: GIVENS CAPSULE STUDY;  Surgeon: Danie Binder, MD;  [facility];  Service: Endoscopy;  Laterality: N/A;  . HERNIA REPAIR    . PROSTATE SURGERY    . TEE WITHOUT CARDIOVERSION N/A 07/08/2015   Procedure: TRANSESOPHAGEAL ECHOCARDIOGRAM (TEE);  Surgeon: Burnell Blanks, MD;  Location: [facility];  Service: Open Heart Surgery;  Laterality: N/A;  . TRANSCATHETER AORTIC VALVE REPLACEMENT, TRANSFEMORAL N/A 07/08/2015   Procedure: TRANSCATHETER AORTIC VALVE REPLACEMENT, TRANSFEMORAL;  Surgeon: Burnell Blanks, MD;  Location: [facility];  Service: Open Heart Surgery;  Laterality: N/A;     reports that he quit smoking about 37 years ago. His smoking use included Cigarettes. He started smoking about 56 years ago. He has a 40.00 pack-year  smoking history. He has quit using smokeless tobacco. He reports that he does not drink alcohol or use drugs.  Allergies  Allergen Reactions  . Gabapentin Other (See Comments)    Capsule causes bad heartburn    Family History  Problem Relation Age of Onset  . Stroke Mother   . Prostate cancer Brother     [name]    . Cancer Other   . Prostate cancer Brother     [name]  . Colon cancer Neg Hx   . Colon polyps Neg Hx     Prior to Admission medications   Medication Sig Start Date End Date Taking? Authorizing Provider  albuterol (PROVENTIL) (2.5 MG/3ML) 0.083% nebulizer solution Take 3 mLs (2.5 mg total) by nebulization every 4 (four) hours as needed for wheezing or shortness of breath. 03/03/15  Yes Kathie Dike, MD  aspirin 81 MG chewable tablet Chew 1 tablet (81 mg total) by mouth daily. 03/27/15  Yes Florencia Reasons, MD  carvedilol (COREG) 6.25 MG tablet Take 1.5 tablets (9.375 mg total) by mouth 2 (two) times daily. 07/21/15  Yes Lendon Colonel, NP  doxycycline (VIBRAMYCIN) 100 MG capsule Take 100 mg by mouth 2 (two) times daily. 10 day course starting on 12/08/2015   Yes Historical Provider, MD  ferrous sulfate 325 (65 FE) MG tablet Take 325 mg by mouth daily with breakfast.   Yes Historical Provider, MD  Fluticasone-Salmeterol (ADVAIR DISKUS) 250-50 MCG/DOSE AEPB Inhale 1 puff into the lungs daily. 08/25/14  Yes Kathie Dike, MD  gabapentin (NEURONTIN) 300 MG capsule Take 300 mg by mouth 3 (three) times daily as needed (for pain/neuropathy).  05/08/15  Yes Historical Provider, MD  pantoprazole (PROTONIX) 40 MG tablet Take 1 tablet (40 mg total) by mouth daily. 08/25/14  Yes Kathie Dike, MD  VENTOLIN HFA 108 (90 BASE) MCG/ACT inhaler Inhale 1 puff into the lungs every 6 (six) hours as needed for wheezing or shortness of breath. Reported on 07/02/2015 05/10/14  Yes Historical Provider, MD  warfarin (COUMADIN) 1 MG tablet Take 0.5 mg by mouth at bedtime. Takes with one 3 mg tablet. 10/17/15  Yes Historical Provider, MD  warfarin (COUMADIN) 3 MG tablet Take 3.5 mg by mouth at bedtime. Takes one 3mg  tablet and one-half of 1mg  tablet nightly for a total of 3.5mg  nightly   Yes Historical Provider, MD  zolpidem (AMBIEN) 10 MG tablet Take 0.5 tablets (5 mg total) by mouth at bedtime as needed for sleep. 03/27/15  Yes Florencia Reasons, MD  allopurinol (ZYLOPRIM) 100 MG tablet Take 100 mg by mouth daily. 09/25/15   Historical Provider, MD  furosemide (LASIX) 40 MG tablet Take 40 mg by mouth daily as needed for edema.    Historical Provider, MD  Iron Polysacch Cmplx-B12-FA 150-0.025-1 MG CAPS Take 1 capsule by mouth 2 (two) times daily. 05/14/15   Patrici Ranks, MD  levofloxacin (LEVAQUIN) 750 MG tablet Take 1 tablet (750 mg total) by mouth daily. Patient not taking: Reported on 12/11/2015 11/03/15   Jani Gravel, MD  pravastatin (PRAVACHOL) 40 MG tablet Take 1 tablet by mouth at bedtime.  07/06/13   Historical Provider, MD  predniSONE (DELTASONE) 10 MG tablet Take 6 tablets (60 mg total) by mouth daily with breakfast. x 1 day then 50mg  po qday x 2 days then 40mg  po qday x 2 days then 30mg  po qday x 2 days then 20mg  po qday x 2 days then 10mg  po qday x 2 days. 11/03/15   Jani Gravel,  MD    Physical Exam:   Constitutional: Elderly obese male in mild respiratory distress appears uncomfortable hospital bed.  Vitals:   12/11/15 1530 12/11/15 1600 12/11/15 1646 12/11/15 1700  BP: 132/64 144/75  149/74  Pulse: 104 102  104  Resp: 20 15  18   Temp:      TempSrc:      SpO2: 97% 98% 98% 96%  Weight:      Height:       Eyes: PERRL, lids and conjunctivae normal ENMT: Mucous membranes are moist. Posterior pharynx clear of any exudate or lesions. Poor dentition.  Neck: normal, supple, no masses, no thyromegaly Respiratory: Bilateral wheezes heard throughout both lung fields. No accessory muscle usage noted. Patient able to talk in almost complete sentences. Cardiac:Tachycardic , no murmurs / rubs / gallops.  trace lower extremity edema. 2+ pedal pulses. No carotid bruits.  Abdomen: protuberant abdomen,  no tenderness, no masses palpated. No hepatosplenomegaly. Bowel sounds positive.  Musculoskeletal: no clubbing / cyanosis. No joint deformity upper and lower extremities. Good ROM, no contractures. Normal muscle tone.  Skin: no rashes,  lesions, ulcers. No induration Neurologic: CN 2-12 grossly intact. Sensation intact, DTR normal. Strength 5/5 in all 4.  Psychiatric: Normal judgment and insight. Alert and oriented x 3. Normal mood.     Labs on Admission: I have personally reviewed following labs and imaging studies  CBC:  Recent Labs Lab 12/11/15 1445  WBC 13.7*  HGB 12.0*  HCT 38.2*  MCV 93.2  PLT [id number]   Basic Metabolic Panel:  Recent Labs Lab 12/11/15 1445  NA 140  K 3.9  CL 100*  CO2 35*  GLUCOSE 162*  BUN 17  CREATININE 1.13  CALCIUM 9.1   GFR: Estimated Creatinine Clearance: 69.4 mL/min (by C-G formula based on SCr of 1.13 mg/dL). Liver Function Tests: No results for input(s): AST, ALT, ALKPHOS, BILITOT, PROT, ALBUMIN in the last 168 hours. No results for input(s): LIPASE, AMYLASE in the last 168 hours. No results for input(s): AMMONIA in the last 168 hours. Coagulation Profile: No results for input(s): INR, PROTIME in the last 168 hours. Cardiac Enzymes: No results for input(s): CKTOTAL, CKMB, CKMBINDEX, TROPONINI in the last 168 hours. BNP (last 3 results) No results for input(s): PROBNP in the last 8760 hours. HbA1C: No results for input(s): HGBA1C in the last 72 hours. CBG: No results for input(s): GLUCAP in the last 168 hours. Lipid Profile: No results for input(s): CHOL, HDL, LDLCALC, TRIG, CHOLHDL, LDLDIRECT in the last 72 hours. Thyroid Function Tests: No results for input(s): TSH, T4TOTAL, FREET4, T3FREE, THYROIDAB in the last 72 hours. Anemia Panel: No results for input(s): VITAMINB12, FOLATE, FERRITIN, TIBC, IRON, RETICCTPCT in the last 72 hours. Urine analysis:    Component Value Date/Time   COLORURINE YELLOW 07/04/2015 [place] 07/04/2015 1122   LABSPEC 1.023 07/04/2015 1122   PHURINE 5.5 07/04/2015 1122   GLUCOSEU NEGATIVE 07/04/2015 1122   HGBUR NEGATIVE 07/04/2015 1122   BILIRUBINUR NEGATIVE 07/04/2015 1122   KETONESUR NEGATIVE 07/04/2015 1122    PROTEINUR NEGATIVE 07/04/2015 1122   NITRITE NEGATIVE 07/04/2015 1122   LEUKOCYTESUR NEGATIVE 07/04/2015 1122   Sepsis Labs: No results found for this or any previous visit (from the past 240 hour(s)).   Radiological Exams on Admission: Dg Chest Portable 1 View  Result Date: 12/11/2015 CLINICAL DATA:  Shortness of breath.  History of prostate carcinoma EXAM: PORTABLE CHEST 1 VIEW COMPARISON:  Chest radiograph November 01, 2015 FINDINGS: There  is no edema or consolidation. There is stable cardiomegaly with pulmonary vascular within normal limits. No adenopathy. There is a prosthetic aortic valve, stable. No adenopathy. No bone lesions. IMPRESSION: Stable cardiomegaly. Prosthetic aortic valve present. No edema or consolidation. Electronically Signed   By: Lowella Grip III M.D.   On: 12/11/2015 14:44    EKG: Independently reviewed. Sinus rhythm with  Assessment/Plan COPD exacerbation/ respiratory failure with hypoxia: Acute on chronic. Patient found to have O2 saturations at 88% on room air. Physical exam reveals bilateral wheezing. Appears patient previously was started on doxycycline question if this was for his breathing. - Admit to MedSurg bed   - COPD protocol initiated - Prednisone 60 mg po qdaily, adjust dosage as needed - DuoNeb nebulizers every 6 hours - Budesonide and Brovana nebs every 12 hours - Mucinex 1 tablet by mouth twice a day.  - Continue doxycycline - May want to consider patient with a pulmonologist as an outpatient  Leukocytosis: WBC 13.7. Question if secondary to infection. - Repeat CBC in a.m.  Status post TAVR on chronic anticoagulation therapy- patient has history of aortic stenosis status post TAVR. INR at goal at 2.8. - continue Coumadin per pharmacy   Anemia: stable - Continue to monitor  Essential Hypertension- blood pressure is controlled,  - continue Coreg and Lasix   Gout - Continue allopurinol  Hyperlipidemia  - continue  pravastatin  Jerrye Bushy - Continue Protonix   DVT prophylaxis: Coumadin Code Status: Full  Family Communication: No family present at bedside Disposition Plan: Possible discharge home 1-2 days Consults called: None Admission status: Observation MedSurg  Norval Morton MD [facility] Pager [phone]  If 7PM-7AM, please contact night-coverage [email or web address] Password [email or web address]  12/11/2015, 6:01 PM

## 2015-12-11 NOTE — Progress Notes (Signed)
ANTICOAGULATION CONSULT NOTE - Initial Consult  Pharmacy Consult for Coumadin (chronic Rx PTA) Indication: mechanical valve  Allergies  Allergen Reactions  . Gabapentin Other (See Comments)    Capsule causes bad heartburn   Patient Measurements: Height: 5\' 6"  (167.6 cm) Weight: 235 lb (106.6 kg) IBW/kg (Calculated) : 63.8  Vital Signs: Temp: 98.8 F (37.1 C) (10/05 1859) Temp Source: Oral (10/05 1859) BP: 146/75 (10/05 1859) Pulse Rate: 109 (10/05 1859)  Labs:  Recent Labs  12/11/15 1445  HGB 12.0*  HCT 38.2*  PLT 372  LABPROT 30.5*  INR 2.84  CREATININE 1.13    Estimated Creatinine Clearance: 68.6 mL/min (by C-G formula based on SCr of 1.13 mg/dL).   Medical History: Past Medical History:  Diagnosis Date  . 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 Angelena Form  . Asthma   . Atrial flutter ([place])   . Chronic anemia   . CKD (chronic kidney disease) stage 3, GFR 30-59 ml/min   . COPD (chronic obstructive pulmonary disease) ([place])   . Essential hypertension   . Gout   . Helicobacter pylori gastritis 05/2014  . Lung nodule   . Mitral regurgitation   . Multiple duodenal ulcers 05/2014  . Prostate cancer ([place])   . Thyroid nodule     Medications:  Prescriptions Prior to Admission  Medication Sig Dispense Refill Last Dose  . albuterol (PROVENTIL) (2.5 MG/3ML) 0.083% nebulizer solution Take 3 mLs (2.5 mg total) by nebulization every 4 (four) hours as needed for wheezing or shortness of breath. 75 mL 12 12/11/2015 at Unknown time  . aspirin 81 MG chewable tablet Chew 1 tablet (81 mg total) by mouth daily. 30 tablet 0 12/11/2015 at Unknown time  . carvedilol (COREG) 6.25 MG tablet Take 1.5 tablets (9.375 mg total) by mouth 2 (two) times daily. 180 tablet 3 12/11/2015 at Unknown time  . doxycycline (VIBRAMYCIN) 100 MG capsule Take 100 mg by mouth 2 (two) times daily. 10 day course starting on 12/08/2015   12/11/2015 at Unknown time  . ferrous sulfate 325 (65 FE) MG  tablet Take 325 mg by mouth daily with breakfast.   12/11/2015 at Unknown time  . Fluticasone-Salmeterol (ADVAIR DISKUS) 250-50 MCG/DOSE AEPB Inhale 1 puff into the lungs daily. 60 each 1 12/11/2015 at Unknown time  . gabapentin (NEURONTIN) 300 MG capsule Take 300 mg by mouth 3 (three) times daily as needed (for pain/neuropathy).    12/11/2015 at Unknown time  . pantoprazole (PROTONIX) 40 MG tablet Take 1 tablet (40 mg total) by mouth daily. 60 tablet 5 12/11/2015 at Unknown time  . VENTOLIN HFA 108 (90 BASE) MCG/ACT inhaler Inhale 1 puff into the lungs every 6 (six) hours as needed for wheezing or shortness of breath. Reported on 07/02/2015   12/11/2015 at Unknown time  . warfarin (COUMADIN) 1 MG tablet Take 0.5 mg by mouth at bedtime. Takes with one 3 mg tablet.   12/10/2015 at 2030a  . warfarin (COUMADIN) 3 MG tablet Take 3.5 mg by mouth at bedtime. Takes one 3mg  tablet and one-half of 1mg  tablet nightly for a total of 3.5mg  nightly   12/10/2015 at 2030a  . zolpidem (AMBIEN) 10 MG tablet Take 0.5 tablets (5 mg total) by mouth at bedtime as needed for sleep. 30 tablet 0 12/10/2015 at Unknown time  . allopurinol (ZYLOPRIM) 100 MG tablet Take 100 mg by mouth daily.   10/31/2015 at Unknown time  . furosemide (LASIX) 40 MG tablet Take 40  mg by mouth daily as needed for edema.   10/31/2015 at Unknown time  . Iron Polysacch Cmplx-B12-FA 150-0.025-1 MG CAPS Take 1 capsule by mouth 2 (two) times daily. 60 each 0 10/31/2015 at Unknown time  . levofloxacin (LEVAQUIN) 750 MG tablet Take 1 tablet (750 mg total) by mouth daily. (Patient not taking: Reported on 12/11/2015) 3 tablet 0 Not Taking at Unknown time  . pravastatin (PRAVACHOL) 40 MG tablet Take 1 tablet by mouth at bedtime.    10/31/2015 at Unknown time  . predniSONE (DELTASONE) 10 MG tablet Take 6 tablets (60 mg total) by mouth daily with breakfast. x 1 day then 50mg  po qday x 2 days then 40mg  po qday x 2 days then 30mg  po qday x 2 days then 20mg  po qday x 2 days then  10mg  po qday x 2 days. 36 tablet 0     Assessment: 70yo male on chronic Coumadin PTA for h/o atrial flutter and mechanical valve.  INR therapeutic on admission = 2.84.  Per PTA med list last Coumadin dose was yesterday (10/4)  Goal of Therapy:  INR 2-3 Monitor platelets by anticoagulation protocol: Yes   Plan:  Coumadin 3.5mg  po tonight x 1 (home dose) INR daily  [place] Crane, Jadore Veals A 12/11/2015,8:04 PM

## 2015-12-11 NOTE — ED Provider Notes (Signed)
[facility] DEPT Provider Note   CSN: [id number] Arrival date & time: 12/11/15  1339     History   Chief Complaint Chief Complaint  Patient presents with  . Shortness of Breath    HPI Ryan Shea is a 70 y.o. male.  HPI Patient resents with shortness of breath. Began yesterday afternoon. States began acutely when he was a little agitated. Continues to wheeze. Has occasional improvement with his inhalers but does not last. No fevers. His cough has occasional green production. No swelling is less. No chest pain. Does not have oxygen at home. Does have history of COPD. Shortness of breath is worse with exertion.   Past Medical History:  Diagnosis Date  . 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 Angelena Form  . Asthma   . Atrial flutter ([place])   . Chronic anemia   . CKD (chronic kidney disease) stage 3, GFR 30-59 ml/min   . COPD (chronic obstructive pulmonary disease) ([place])   . Essential hypertension   . Gout   . Helicobacter pylori gastritis 05/2014  . Lung nodule   . Mitral regurgitation   . Multiple duodenal ulcers 05/2014  . Prostate cancer ([place])   . Thyroid nodule     Patient Active Problem List   Diagnosis Date Noted  . Thyroid nodule 11/01/2015  . Lung nodule 11/01/2015  . Iron deficiency anemia due to chronic blood loss 08/12/2015  . Severe aortic stenosis 07/08/2015  . Blood in stool   . NSTEMI (non-ST elevated myocardial infarction) ([place]) 03/22/2015  . CKD (chronic kidney disease) stage 3, GFR 30-59 ml/min 03/22/2015  . Morbid obesity ([place]) 03/22/2015  . Elevated troponin   . COPD exacerbation ([place]) 03/02/2015  . Acute renal failure ([place]) 08/24/2014  . Chronic anticoagulation 08/24/2014  . GI bleed 08/23/2014  . Anemia 08/23/2014  . Rectal bleeding 08/23/2014  . Colon cancer screening 07/11/2014  . Multiple duodenal ulcers 05/30/2014  . Aortic stenosis 04/23/2013  . Mitral regurgitation 04/23/2013  . Cardiomyopathy ([place]) 04/23/2013  .  Paroxysmal atrial flutter ([place]) 04/23/2013  . HTN (hypertension) 04/20/2013  . High cholesterol 04/20/2013  . COPD (chronic obstructive pulmonary disease) ([place]) 04/20/2013  . Prostate cancer ([place]) 04/20/2013  . Backache, unspecified 04/20/2013  . Body mass index 40.0-44.9, adult ([place]) 04/20/2013    Past Surgical History:  Procedure Laterality Date  . CARDIAC CATHETERIZATION N/A 03/26/2015   Procedure: Right/Left Heart Cath and Coronary Angiography;  Surgeon: Burnell Blanks, MD;  Location: [facility];  Service: Cardiovascular;  Laterality: N/A;  . COLONOSCOPY    . COLONOSCOPY N/A 08/25/2014   Rehman:examination performed to cecum. moderate number of diverticula at sigmoid colon without stigmata of bleed small external hemorrhoids and anal papillae  . ESOPHAGOGASTRODUODENOSCOPY N/A 05/31/2014   H PYLORI GASTRITIS, DUODENAL ULCERS  . GIVENS CAPSULE STUDY N/A 04/18/2015   Procedure: GIVENS CAPSULE STUDY;  Surgeon: Danie Binder, MD;  [facility];  Service: Endoscopy;  Laterality: N/A;  . HERNIA REPAIR    . PROSTATE SURGERY    . TEE WITHOUT CARDIOVERSION N/A 07/08/2015   Procedure: TRANSESOPHAGEAL ECHOCARDIOGRAM (TEE);  Surgeon: Burnell Blanks, MD;  Location: [facility];  Service: Open Heart Surgery;  Laterality: N/A;  . TRANSCATHETER AORTIC VALVE REPLACEMENT, TRANSFEMORAL N/A 07/08/2015   Procedure: TRANSCATHETER AORTIC VALVE REPLACEMENT, TRANSFEMORAL;  Surgeon: Burnell Blanks, MD;  Location: [facility];  Service: Open Heart Surgery;  Laterality: N/A;       Home  Medications    Prior to Admission medications   Medication Sig Start Date End Date Taking? Authorizing Provider  albuterol (PROVENTIL) (2.5 MG/3ML) 0.083% nebulizer solution Take 3 mLs (2.5 mg total) by nebulization every 4 (four) hours as needed for wheezing or shortness of breath. 03/03/15  Yes Kathie Dike, MD  aspirin 81 MG chewable tablet Chew 1 tablet (81 mg total) by mouth daily. 03/27/15   Yes Florencia Reasons, MD  carvedilol (COREG) 6.25 MG tablet Take 1.5 tablets (9.375 mg total) by mouth 2 (two) times daily. 07/21/15  Yes Lendon Colonel, NP  doxycycline (VIBRAMYCIN) 100 MG capsule Take 100 mg by mouth 2 (two) times daily. 10 day course starting on 12/08/2015   Yes Historical Provider, MD  ferrous sulfate 325 (65 FE) MG tablet Take 325 mg by mouth daily with breakfast.   Yes Historical Provider, MD  Fluticasone-Salmeterol (ADVAIR DISKUS) 250-50 MCG/DOSE AEPB Inhale 1 puff into the lungs daily. 08/25/14  Yes Kathie Dike, MD  gabapentin (NEURONTIN) 300 MG capsule Take 300 mg by mouth 3 (three) times daily as needed (for pain/neuropathy).  05/08/15  Yes Historical Provider, MD  pantoprazole (PROTONIX) 40 MG tablet Take 1 tablet (40 mg total) by mouth daily. 08/25/14  Yes Kathie Dike, MD  VENTOLIN HFA 108 (90 BASE) MCG/ACT inhaler Inhale 1 puff into the lungs every 6 (six) hours as needed for wheezing or shortness of breath. Reported on 07/02/2015 05/10/14  Yes Historical Provider, MD  warfarin (COUMADIN) 1 MG tablet Take 0.5 mg by mouth at bedtime. Takes with one 3 mg tablet. 10/17/15  Yes Historical Provider, MD  warfarin (COUMADIN) 3 MG tablet Take 3.5 mg by mouth at bedtime. Takes one 3mg  tablet and one-half of 1mg  tablet nightly for a total of 3.5mg  nightly   Yes Historical Provider, MD  zolpidem (AMBIEN) 10 MG tablet Take 0.5 tablets (5 mg total) by mouth at bedtime as needed for sleep. 03/27/15  Yes Florencia Reasons, MD  allopurinol (ZYLOPRIM) 100 MG tablet Take 100 mg by mouth daily. 09/25/15   Historical Provider, MD  furosemide (LASIX) 40 MG tablet Take 40 mg by mouth daily as needed for edema.    Historical Provider, MD  Iron Polysacch Cmplx-B12-FA 150-0.025-1 MG CAPS Take 1 capsule by mouth 2 (two) times daily. 05/14/15   Patrici Ranks, MD  levofloxacin (LEVAQUIN) 750 MG tablet Take 1 tablet (750 mg total) by mouth daily. Patient not taking: Reported on 12/11/2015 11/03/15   Jani Gravel, MD    pravastatin (PRAVACHOL) 40 MG tablet Take 1 tablet by mouth at bedtime.  07/06/13   Historical Provider, MD  predniSONE (DELTASONE) 10 MG tablet Take 6 tablets (60 mg total) by mouth daily with breakfast. x 1 day then 50mg  po qday x 2 days then 40mg  po qday x 2 days then 30mg  po qday x 2 days then 20mg  po qday x 2 days then 10mg  po qday x 2 days. 11/03/15   Jani Gravel, MD    Family History Family History  Problem Relation Age of Onset  . Stroke Mother   . Prostate cancer Brother     [name]  . Cancer Other   . Prostate cancer Brother     [name]  . Colon cancer Neg Hx   . Colon polyps Neg Hx     Social History Social History  Substance Use Topics  . Smoking status: Former Smoker    Packs/day: 2.00    Years: 20.00    Types: Cigarettes  Start date: 09/01/1959    Quit date: 09/01/1978  . Smokeless tobacco: Former Systems developer     Comment: quit 30 years ago  . Alcohol use No     Allergies   Gabapentin   Review of Systems Review of Systems  Constitutional: Positive for fatigue. Negative for appetite change.  HENT: Negative for congestion.   Respiratory: Positive for cough, shortness of breath and wheezing. Negative for choking.   Cardiovascular: Negative for chest pain.  Gastrointestinal: Negative for abdominal pain.  Endocrine: Negative for polyuria.  Genitourinary: Negative for flank pain.  Musculoskeletal: Negative for back pain.  Skin: Negative for wound.  Neurological: Negative for seizures.  Psychiatric/Behavioral: Negative for confusion.     Physical Exam Updated Vital Signs BP 149/74   Pulse 104   Temp 97.8 F (36.6 C) (Oral)   Resp 18   Ht 5\' 6"  (1.676 m)   Wt 240 lb (108.9 kg)   SpO2 96%   BMI 38.74 kg/m   Physical Exam  Constitutional: He appears well-developed.  HENT:  Head: Atraumatic.  Eyes: Pupils are equal, round, and reactive to light.  Neck: No JVD present.  Cardiovascular:  Mild tachycardia  Pulmonary/Chest:  Diffuse wheezes and prolonged  expirations. No focal rales or rhonchi.  Abdominal: Soft. There is no tenderness.  Musculoskeletal: He exhibits no edema.  Skin: Skin is warm. Capillary refill takes less than 2 seconds.  Psychiatric: He has a normal mood and affect.     ED Treatments / Results  Labs (all labs ordered are listed, but only abnormal results are displayed) Labs Reviewed  BASIC METABOLIC PANEL - Abnormal; Notable for the following:       Result Value   Chloride 100 (*)    CO2 35 (*)    Glucose, Bld 162 (*)    All other components within normal limits  CBC - Abnormal; Notable for the following:    WBC 13.7 (*)    RBC 4.10 (*)    Hemoglobin 12.0 (*)    HCT 38.2 (*)    All other components within normal limits  PROTIME-INR    EKG  EKG Interpretation  Date/Time:  Thursday December 11 2015 14:21:10 EDT Ventricular Rate:  115 PR Interval:    QRS Duration: 139 QT Interval:  327 QTC Calculation: 453 R Axis:   80 Text Interpretation:  sinus rhythm Left bundle branch block Confirmed by Alvino Chapel  MD, [place] Berkley [phone]) on 12/11/2015 4:02:21 PM       Radiology Dg Chest Portable 1 View  Result Date: 12/11/2015 CLINICAL DATA:  Shortness of breath.  History of prostate carcinoma EXAM: PORTABLE CHEST 1 VIEW COMPARISON:  Chest radiograph November 01, 2015 FINDINGS: There is no edema or consolidation. There is stable cardiomegaly with pulmonary vascular within normal limits. No adenopathy. There is a prosthetic aortic valve, stable. No adenopathy. No bone lesions. IMPRESSION: Stable cardiomegaly. Prosthetic aortic valve present. No edema or consolidation. Electronically Signed   By: Lowella Grip III M.D.   On: 12/11/2015 14:44    Procedures Procedures (including critical care time)  Medications Ordered in ED Medications  albuterol (PROVENTIL) (2.5 MG/3ML) 0.083% nebulizer solution 5 mg (not administered)  albuterol (PROVENTIL) (2.5 MG/3ML) 0.083% nebulizer solution 5 mg (5 mg Nebulization Given 12/11/15  1426)  ipratropium (ATROVENT) nebulizer solution 0.5 mg (0.5 mg Nebulization Given 12/11/15 1430)  predniSONE (DELTASONE) tablet 60 mg (60 mg Oral Given 12/11/15 1628)  albuterol (PROVENTIL) (2.5 MG/3ML) 0.083% nebulizer solution 5 mg (5 mg Nebulization Given  12/11/15 1646)     Initial Impression / Assessment and Plan / ED Course  I have reviewed the triage vital signs and the nursing notes.  Pertinent labs & imaging results that were available during my care of the patient were reviewed by me and considered in my medical decision making (see chart for details).  Clinical Course    Patient with shortness of breath and cough. History of asthma. Patient will be hypoxic on room air down to 88% at rest. Patient was not ambulated since he was hypoxic at rest. Had steroids and 3 breathing treatments. Continued hypoxia. Admit to internal medicine.  Final Clinical Impressions(s) / ED Diagnoses   Final diagnoses:  COPD exacerbation [facility])    New Prescriptions New Prescriptions   No medications on file     Davonna Belling, MD 12/11/15 1813

## 2015-12-11 NOTE — ED Triage Notes (Signed)
SHORT OF BREATH WITH WHEEZING

## 2015-12-12 DIAGNOSIS — D509 Iron deficiency anemia, unspecified: Secondary | ICD-10-CM | POA: Diagnosis not present

## 2015-12-12 DIAGNOSIS — J441 Chronic obstructive pulmonary disease with (acute) exacerbation: Secondary | ICD-10-CM | POA: Diagnosis not present

## 2015-12-12 DIAGNOSIS — J9601 Acute respiratory failure with hypoxia: Secondary | ICD-10-CM | POA: Diagnosis not present

## 2015-12-12 DIAGNOSIS — Z7901 Long term (current) use of anticoagulants: Secondary | ICD-10-CM | POA: Diagnosis not present

## 2015-12-12 LAB — CBC
HEMATOCRIT: 36 % — AB (ref 39.0–52.0)
Hemoglobin: 11.3 g/dL — ABNORMAL LOW (ref 13.0–17.0)
MCH: 29.6 pg (ref 26.0–34.0)
MCHC: 31.4 g/dL (ref 30.0–36.0)
MCV: 94.2 fL (ref 78.0–100.0)
PLATELETS: 346 10*3/uL (ref 150–400)
RBC: 3.82 MIL/uL — ABNORMAL LOW (ref 4.22–5.81)
RDW: 14.1 % (ref 11.5–15.5)
WBC: 13.4 10*3/uL — AB (ref 4.0–10.5)

## 2015-12-12 LAB — BASIC METABOLIC PANEL
ANION GAP: 4 — AB (ref 5–15)
BUN: 15 mg/dL (ref 6–20)
CALCIUM: 9.1 mg/dL (ref 8.9–10.3)
CO2: 37 mmol/L — ABNORMAL HIGH (ref 22–32)
CREATININE: 0.91 mg/dL (ref 0.61–1.24)
Chloride: 100 mmol/L — ABNORMAL LOW (ref 101–111)
GFR calc Af Amer: >60 mL/min (ref >60)
GLUCOSE: 115 mg/dL — AB (ref 65–99)
Potassium: 4.8 mmol/L (ref 3.5–5.1)
Sodium: 141 mmol/L (ref 135–145)

## 2015-12-12 LAB — PROTIME-INR
INR: 2.99
Prothrombin Time: 31.7 seconds — ABNORMAL HIGH (ref 11.4–15.2)

## 2015-12-12 MED ORDER — PREDNISONE 10 MG PO TABS: ORAL_TABLET | ORAL | 0 refills | Status: DC

## 2015-12-12 MED ORDER — WARFARIN SODIUM 1 MG PO TABS: 1.0000 mg | ORAL_TABLET | Freq: Once | ORAL | Status: DC

## 2015-12-12 MED ORDER — PNEUMOCOCCAL VAC POLYVALENT 25 MCG/0.5ML IJ INJ: 0.5000 mL | INJECTION | INTRAMUSCULAR | Status: DC

## 2015-12-12 MED ORDER — GUAIFENESIN ER 600 MG PO TB12: 600.0000 mg | ORAL_TABLET | Freq: Two times a day (BID) | ORAL | 0 refills | Status: AC

## 2015-12-12 NOTE — Progress Notes (Addendum)
[facility] for Coumadin (chronic Rx PTA) Indication: atrial flutter and aortic valve replacement  Allergies  Allergen Reactions  . Gabapentin Other (See Comments)    Capsule causes bad heartburn   Patient Measurements: Height: 5\' 6"  (167.6 cm) Weight: 235 lb (106.6 kg) IBW/kg (Calculated) : 63.8  Vital Signs: Temp: 97.7 F (36.5 C) (10/06 0631) BP: 142/63 (10/06 0631) Pulse Rate: 99 (10/06 0631)  Labs:  Recent Labs  12/11/15 1445 12/12/15 0627  HGB 12.0* 11.3*  HCT 38.2* 36.0*  PLT 372 346  LABPROT 30.5* 31.7*  INR 2.84 2.99  CREATININE 1.13 0.91   Estimated Creatinine Clearance: 85.2 mL/min (by C-G formula based on SCr of 0.91 mg/dL).  Medical History: Past Medical History:  Diagnosis Date  . 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 Angelena Form  . Asthma   . Atrial flutter ([place])   . Chronic anemia   . CKD (chronic kidney disease) stage 3, GFR 30-59 ml/min   . COPD (chronic obstructive pulmonary disease) ([place])   . Essential hypertension   . Gout   . Helicobacter pylori gastritis 05/2014  . Lung nodule   . Mitral regurgitation   . Multiple duodenal ulcers 05/2014  . Prostate cancer ([place])   . Thyroid nodule    Medications:  Prescriptions Prior to Admission  Medication Sig Dispense Refill Last Dose  . albuterol (PROVENTIL) (2.5 MG/3ML) 0.083% nebulizer solution Take 3 mLs (2.5 mg total) by nebulization every 4 (four) hours as needed for wheezing or shortness of breath. 75 mL 12 12/11/2015 at Unknown time  . aspirin 81 MG chewable tablet Chew 1 tablet (81 mg total) by mouth daily. 30 tablet 0 12/11/2015 at Unknown time  . carvedilol (COREG) 6.25 MG tablet Take 1.5 tablets (9.375 mg total) by mouth 2 (two) times daily. 180 tablet 3 12/11/2015 at Unknown time  . colchicine 0.6 MG tablet Take 0.6 mg by mouth daily.   12/11/2015 at Unknown time  . doxycycline (VIBRAMYCIN) 100 MG capsule Take 100 mg by mouth 2 (two) times  daily. 10 day course starting on 12/08/2015   12/11/2015 at Unknown time  . ferrous sulfate 325 (65 FE) MG tablet Take 325 mg by mouth daily with breakfast.   12/11/2015 at Unknown time  . Fluticasone-Salmeterol (ADVAIR DISKUS) 250-50 MCG/DOSE AEPB Inhale 1 puff into the lungs daily. 60 each 1 12/11/2015 at Unknown time  . furosemide (LASIX) 40 MG tablet Take 40 mg by mouth daily as needed for edema.   unknown  . gabapentin (NEURONTIN) 300 MG capsule Take 300 mg by mouth 3 (three) times daily as needed (for pain/neuropathy).    12/11/2015 at Unknown time  . pantoprazole (PROTONIX) 40 MG tablet Take 1 tablet (40 mg total) by mouth daily. (Patient taking differently: Take 40 mg by mouth 2 (two) times daily. ) 60 tablet 5 12/11/2015 at Unknown time  . pravastatin (PRAVACHOL) 40 MG tablet Take 1 tablet by mouth at bedtime.    12/11/2015 at Unknown time  . VENTOLIN HFA 108 (90 BASE) MCG/ACT inhaler Inhale 1 puff into the lungs every 6 (six) hours as needed for wheezing or shortness of breath. Reported on 07/02/2015   12/11/2015 at Unknown time  . warfarin (COUMADIN) 1 MG tablet Take 0.5 mg by mouth at bedtime. Takes with one 3 mg tablet.   12/10/2015 at 2030a  . warfarin (COUMADIN) 3 MG tablet Take 3.5 mg by mouth at bedtime. Takes one 3mg   tablet and one-half of 1mg  tablet nightly for a total of 3.5mg  nightly   12/10/2015 at 2030a  . zolpidem (AMBIEN) 10 MG tablet Take 0.5 tablets (5 mg total) by mouth at bedtime as needed for sleep. 30 tablet 0 12/10/2015 at Unknown time  . levofloxacin (LEVAQUIN) 750 MG tablet Take 1 tablet (750 mg total) by mouth daily. (Patient not taking: Reported on 12/11/2015) 3 tablet 0 Not Taking at Unknown time   Assessment: 70yo male on chronic Coumadin PTA for h/o atrial flutter and h/o aortic valve replacement.  INR therapeutic on admission = 2.84.  INR 2.99 today.  Coumadin dose was charted as given last night. Pt does have h/o GI bleed.  CBC appears stable, no bleeding reported.     Goal of Therapy:  INR 2-3 Monitor platelets by anticoagulation protocol: Yes   Plan:  Coumadin 1mg  po today to discourage overshoot INR daily  [place] Crane, Corinda Ammon A 12/12/2015,11:34 AM

## 2015-12-12 NOTE — Discharge Summary (Signed)
Physician Discharge Summary  Ryan Shea [id number] DOB: [birth year] DOA: 12/11/2015  PCP: [place] date: 12/11/2015 Discharge date: 12/12/2015  Admitted From: home Disposition:  home  Recommendations for Outpatient Follow-up:  1. Follow up with PCP in 1-2 weeks 2. Please obtain BMP/CBC in one week 3. Patient will be referred to pulmonology for follow up  Home Health: home health RN Equipment/Devices:  Discharge Condition: stable CODE STATUS: full Diet recommendation: Heart Healthy   Brief/Interim Summary: This is a 70 year old male with a history of COPD who presents to the hospital with complaints of shortness of breath. He describes shortness of breath with associated wheezing, productive cough with green colored sputum. He has been using his nebulizer treatments at home without any significant relief. He was evaluated in the emergency room and felt to have COPD exacerbation and referred for admission.  Discharge Diagnoses:  Principal Problem:   COPD exacerbation ([place]) Active Problems:   HTN (hypertension)   Anemia   Chronic anticoagulation   Acute respiratory failure with hypoxia (HCC)   S/P TAVR (transcatheter aortic valve replacement)   GERD (gastroesophageal reflux disease)  Patient was started on scheduled nebulizer treatments, oral prednisone and doxycycline. With treatment, his wheezing has since resolved. Shortness of breath has improved and is able to ambulate on room air without difficulty. Oxygen saturations were maintained above 90% on room air. He does not qualify for home oxygen. He'll be continued on prednisone taper. Will continue doxycycline at home. He already has a nebulizer set up at home. He'll be referred to a pulmonologist to be evaluated as an outpatient. At this point, he is feeling back to baseline and feels ready for discharge home. The remainder of his medical issues remained stable.  Discharge  Instructions  Discharge Instructions    Diet - low sodium heart healthy    Complete by:  As directed    Face-to-face encounter (required for Medicare/Medicaid patients)    Complete by:  As directed    I MEMON,JEHANZEB certify that this patient is under my care and that I, or a nurse practitioner or physician's assistant working with me, had a face-to-face encounter that meets the physician face-to-face encounter requirements with this patient on 12/12/2015. The encounter with the patient was in whole, or in part for the following medical condition(s) which is the primary reason for home health care (List medical condition): copd exacerbation   The encounter with the patient was in whole, or in part, for the following medical condition, which is the primary reason for home health care:  copd exacerbation   I certify that, based on my findings, the following services are medically necessary home health services:  Nursing   Reason for Medically Necessary Home Health Services:  Skilled Nursing- Skilled Assessment/Observation   My clinical findings support the need for the above services:  Shortness of breath with activity   Further, I certify that my clinical findings support that this patient is homebound due to:  Shortness of Breath with activity   Home Health    Complete by:  As directed    To provide the following care/treatments:  RN   Increase activity slowly    Complete by:  As directed        Medication List    STOP taking these medications   levofloxacin 750 MG tablet Commonly known as:  LEVAQUIN     TAKE these medications   aspirin 81 MG chewable tablet Chew 1 tablet (  81 mg total) by mouth daily.   carvedilol 6.25 MG tablet Commonly known as:  COREG Take 1.5 tablets (9.375 mg total) by mouth 2 (two) times daily.   colchicine 0.6 MG tablet Take 0.6 mg by mouth daily.   doxycycline 100 MG capsule Commonly known as:  VIBRAMYCIN Take 100 mg by mouth 2 (two) times daily. 10  day course starting on 12/08/2015   ferrous sulfate 325 (65 FE) MG tablet Take 325 mg by mouth daily with breakfast.   Fluticasone-Salmeterol 250-50 MCG/DOSE Aepb Commonly known as:  ADVAIR DISKUS Inhale 1 puff into the lungs daily.   furosemide 40 MG tablet Commonly known as:  LASIX Take 40 mg by mouth daily as needed for edema.   gabapentin 300 MG capsule Commonly known as:  NEURONTIN Take 300 mg by mouth 3 (three) times daily as needed (for pain/neuropathy).   guaiFENesin 600 MG 12 hr tablet Commonly known as:  MUCINEX Take 1 tablet (600 mg total) by mouth 2 (two) times daily.   pantoprazole 40 MG tablet Commonly known as:  PROTONIX Take 1 tablet (40 mg total) by mouth daily. What changed:  when to take this   pravastatin 40 MG tablet Commonly known as:  PRAVACHOL Take 1 tablet by mouth at bedtime.   predniSONE 10 MG tablet Commonly known as:  DELTASONE Tae 60mg  daily for 2 days then 40mg  daily for 2 days then 30mg  daily for 2 days then 20mg  daily for 2 days then 10mg  daily for 2 days   VENTOLIN HFA 108 (90 Base) MCG/ACT inhaler Generic drug:  albuterol Inhale 1 puff into the lungs every 6 (six) hours as needed for wheezing or shortness of breath. Reported on 07/02/2015   albuterol (2.5 MG/3ML) 0.083% nebulizer solution Commonly known as:  PROVENTIL Take 3 mLs (2.5 mg total) by nebulization every 4 (four) hours as needed for wheezing or shortness of breath.   warfarin 3 MG tablet Commonly known as:  COUMADIN Take 3.5 mg by mouth at bedtime. Takes one 3mg  tablet and one-half of 1mg  tablet nightly for a total of 3.5mg  nightly   warfarin 1 MG tablet Commonly known as:  COUMADIN Take 0.5 mg by mouth at bedtime. Takes with one 3 mg tablet.   zolpidem 10 MG tablet Commonly known as:  AMBIEN Take 0.5 tablets (5 mg total) by mouth at bedtime as needed for sleep.      [place] [facility]. Schedule an appointment as soon as  possible for a visit in 2 week(s).   Contact information: [address] [place] [address] [phone]          Allergies  Allergen Reactions  . Gabapentin Other (See Comments)    Capsule causes bad heartburn    Consultations:     Procedures/Studies: Dg Chest Portable 1 View  Result Date: 12/11/2015 CLINICAL DATA:  Shortness of breath.  History of prostate carcinoma EXAM: PORTABLE CHEST 1 VIEW COMPARISON:  Chest radiograph November 01, 2015 FINDINGS: There is no edema or consolidation. There is stable cardiomegaly with pulmonary vascular within normal limits. No adenopathy. There is a prosthetic aortic valve, stable. No adenopathy. No bone lesions. IMPRESSION: Stable cardiomegaly. Prosthetic aortic valve present. No edema or consolidation. Electronically Signed   By: Lowella Grip III M.D.   On: 12/11/2015 14:44       Subjective: Feeling better. Wheezing resolved. Shortness of breath improved  Discharge Exam: Vitals:   12/11/15 2200 12/12/15 0631  BP: (!) 143/77 (!) 142/63  Pulse: 100 99  Resp: 20 16  Temp: 97.4 F (36.3 C) 97.7 F (36.5 C)   Vitals:   12/12/15 0748 12/12/15 0751 12/12/15 0756 12/12/15 1147  BP:      Pulse:      Resp:      Temp:      TempSrc:      SpO2: 98% 100% 99% 92%  Weight:      Height:        General: Pt is alert, awake, not in acute distress Cardiovascular: RRR, S1/S2 +, no rubs, no gallops Respiratory: CTA bilaterally, no wheezing, no rhonchi Abdominal: Soft, NT, ND, bowel sounds + Extremities: no edema, no cyanosis    The results of significant diagnostics from this hospitalization (including imaging, microbiology, ancillary and laboratory) are listed below for reference.     Microbiology: No results found for this or any previous visit (from the past 240 hour(s)).   Labs: BNP (last 3 results)  Recent Labs  09/29/15 1133  BNP 123456   Basic Metabolic Panel:  Recent Labs Lab 12/11/15 1445 12/12/15 0627  NA  140 141  K 3.9 4.8  CL 100* 100*  CO2 35* 37*  GLUCOSE 162* 115*  BUN 17 15  CREATININE 1.13 0.91  CALCIUM 9.1 9.1   Liver Function Tests: No results for input(s): AST, ALT, ALKPHOS, BILITOT, PROT, ALBUMIN in the last 168 hours. No results for input(s): LIPASE, AMYLASE in the last 168 hours. No results for input(s): AMMONIA in the last 168 hours. CBC:  Recent Labs Lab 12/11/15 1445 12/12/15 0627  WBC 13.7* 13.4*  HGB 12.0* 11.3*  HCT 38.2* 36.0*  MCV 93.2 94.2  PLT 372 346   Cardiac Enzymes: No results for input(s): CKTOTAL, CKMB, CKMBINDEX, TROPONINI in the last 168 hours. BNP: Invalid input(s): POCBNP CBG: No results for input(s): GLUCAP in the last 168 hours. D-Dimer No results for input(s): DDIMER in the last 72 hours. Hgb A1c No results for input(s): HGBA1C in the last 72 hours. Lipid Profile No results for input(s): CHOL, HDL, LDLCALC, TRIG, CHOLHDL, LDLDIRECT in the last 72 hours. Thyroid function studies No results for input(s): TSH, T4TOTAL, T3FREE, THYROIDAB in the last 72 hours.  Invalid input(s): FREET3 Anemia work up No results for input(s): VITAMINB12, FOLATE, FERRITIN, TIBC, IRON, RETICCTPCT in the last 72 hours. Urinalysis    Component Value Date/Time   COLORURINE YELLOW 07/04/2015 1122   APPEARANCEUR CLEAR 07/04/2015 1122   LABSPEC 1.023 07/04/2015 1122   PHURINE 5.5 07/04/2015 1122   GLUCOSEU NEGATIVE 07/04/2015 1122   HGBUR NEGATIVE 07/04/2015 1122   BILIRUBINUR NEGATIVE 07/04/2015 1122   KETONESUR NEGATIVE 07/04/2015 1122   PROTEINUR NEGATIVE 07/04/2015 1122   NITRITE NEGATIVE 07/04/2015 1122   LEUKOCYTESUR NEGATIVE 07/04/2015 1122   Sepsis Labs Invalid input(s): PROCALCITONIN,  WBC,  LACTICIDVEN Microbiology No results found for this or any previous visit (from the past 240 hour(s)).   Time coordinating discharge: Over 30 minutes  SIGNED:   Kathie Dike, MD  [facility] 12/12/2015, 12:38 PM Pager   If 7PM-7AM,  please contact night-coverage [email or web address] Password [email or web address]

## 2015-12-12 NOTE — Care Management Note (Signed)
Case Management Note  Patient Details  Name: Ryan Shea MRN: [id number] Date of Birth: 08/29/1945  Subjective/Objective:  Patient adm from home with COPD exacerbation. He has a PCP and insurance, reports no issues getting to appointments or affording medications. He did not qualify for oxygen at home.          He has a Therapist, sports that comes to see him once a week from [facility]. He does not want any other [place] services.      Action/Plan: Discharging home today with self care.   Expected Discharge Date:         12/12/2015         Expected Discharge Plan:  Home/Self Care  In-House Referral:  NA  Discharge planning Services  CM Consult  Post Acute Care Choice:  NA Choice offered to:  NA  DME Arranged:    DME Agency:     HH Arranged:    HH Agency:     Status of Service:  Completed, signed off  If discussed at [employer] of Stay Meetings, dates discussed:    Additional Comments:  Iara Monds, Chauncey Reading, RN 12/12/2015, 1:29 PM

## 2015-12-12 NOTE — Clinical Social Work Note (Signed)
Clinical Social Work Assessment  Patient Details  Name: Ryan Shea MRN: [id number] Date of Birth: 22-May-1945  Date of referral:  12/12/15               Reason for consult:  Discharge Planning                Permission sought to share information with:    Permission granted to share information::     Name::        Agency::     Relationship::     Contact Information:     Housing/Transportation Living arrangements for the past 2 months:  Single Family Home Source of Information:  Patient Patient Interpreter Needed:  None Criminal Activity/Legal Involvement Pertinent to Current Situation/Hospitalization:  No - Comment as needed Significant Relationships:  Spouse, Adult Children Lives with:  Spouse Do you feel safe going back to the place where you live?  Yes Need for family participation in patient care:  No (Coment)  Care giving concerns:  None reported. Pt is independent at baseline.   Social Worker assessment / plan:  CSW met with pt at bedside following COPD gold protocol referral. Pt alert and oriented and states he lives with his wife. He describes his best support as his wife and daughter. Pt has been in hospital 4 times in last 6 months which he says are related to COPD. He indicates he was diagnosed months ago. Pt reports he takes medications as prescribed and goes to doctor appointments. He is not on chronic oxygen, but said he may need it this time. He is independent at baseline and still drives. Pt plans to return home when medically stable. CSW discussed prevalence of anxiety/depression in patients with chronic illness. He has no diagnosis in chart of this and denies any symptoms. CSW asked about completing anxiety/depression screenings and pt declined. Will sign off, but can be reconsulted if needed.   Employment status:  Retired Nurse, adult PT Recommendations:  Not assessed at this time Information / Referral to community resources:  Other  (Comment Required) (none needed per pt)  Patient/Family's Response to care:  Pt appreciative of CSW visit, but reports no needs at this time.   Patient/Family's Understanding of and Emotional Response to Diagnosis, Current Treatment, and Prognosis:  Pt aware of admission diagnosis and treatment plan.   Emotional Assessment Appearance:  Appears stated age Attitude/Demeanor/Rapport:  Other (Pleasant) Affect (typically observed):  Appropriate Orientation:  Oriented to Self, Oriented to Place, Oriented to  Time, Oriented to Situation Alcohol / Substance use:  Not Applicable Psych involvement (Current and /or in the community):  No (Comment)  Discharge Needs  Concerns to be addressed:  Discharge Planning Concerns Readmission within the last 30 days:  No Current discharge risk:  Chronically ill Barriers to Discharge:  Continued Medical Work up   [employer], [place] 12/12/2015, 11:39 AM [phone]

## 2015-12-12 NOTE — Progress Notes (Signed)
SATURATION QUALIFICATIONS: (This note is used to comply with regulatory documentation for home oxygen)  Patient Saturations on Room Air at Rest = 96%  Patient Saturations on Room Air while Ambulating = 95%  Patient Saturations on 2 Liters of oxygen while Ambulating = 98%  Please briefly explain why patient needs home oxygen:

## 2015-12-12 NOTE — Care Management Obs Status (Signed)
[place] NOTIFICATION   Patient Details  Name: Ryan Shea MRN: [id number] Date of Birth: Jan 12, 1946   Medicare Observation Status Notification Given:  Yes    Galan Ghee, Chauncey Reading, RN 12/12/2015, 1:32 PM

## 2015-12-12 NOTE — Progress Notes (Signed)
Patient with orders to be discharge home. Discharge instructions given, patient verbalized understanding. Patient stable. Patient left in private vehicle with family.  

## 2015-12-13 ENCOUNTER — Emergency Department (HOSPITAL_COMMUNITY): Payer: Medicare PPO

## 2015-12-13 ENCOUNTER — Inpatient Hospital Stay (HOSPITAL_COMMUNITY)
Admission: EM | Admit: 2015-12-13 | Discharge: 2015-12-17 | DRG: 189 | Disposition: A | Discharge status: Home / Self Care | Payer: Medicare PPO | Attending: Internal Medicine | Admitting: Internal Medicine

## 2015-12-13 ENCOUNTER — Encounter (HOSPITAL_COMMUNITY): Payer: Self-pay | Admitting: *Deleted

## 2015-12-13 DIAGNOSIS — J9601 Acute respiratory failure with hypoxia: Secondary | ICD-10-CM | POA: Diagnosis present

## 2015-12-13 DIAGNOSIS — Z8042 Family history of malignant neoplasm of prostate: Secondary | ICD-10-CM

## 2015-12-13 DIAGNOSIS — I129 Hypertensive chronic kidney disease with stage 1 through stage 4 chronic kidney disease, or unspecified chronic kidney disease: Secondary | ICD-10-CM | POA: Diagnosis present

## 2015-12-13 DIAGNOSIS — Z7982 Long term (current) use of aspirin: Secondary | ICD-10-CM

## 2015-12-13 DIAGNOSIS — I350 Nonrheumatic aortic (valve) stenosis: Secondary | ICD-10-CM | POA: Diagnosis present

## 2015-12-13 DIAGNOSIS — M109 Gout, unspecified: Secondary | ICD-10-CM | POA: Diagnosis present

## 2015-12-13 DIAGNOSIS — Z888 Allergy status to other drugs, medicaments and biological substances status: Secondary | ICD-10-CM | POA: Diagnosis not present

## 2015-12-13 DIAGNOSIS — Z952 Presence of prosthetic heart valve: Secondary | ICD-10-CM

## 2015-12-13 DIAGNOSIS — I480 Paroxysmal atrial fibrillation: Secondary | ICD-10-CM | POA: Diagnosis present

## 2015-12-13 DIAGNOSIS — I4892 Unspecified atrial flutter: Secondary | ICD-10-CM | POA: Diagnosis present

## 2015-12-13 DIAGNOSIS — D72829 Elevated white blood cell count, unspecified: Secondary | ICD-10-CM | POA: Diagnosis present

## 2015-12-13 DIAGNOSIS — I10 Essential (primary) hypertension: Secondary | ICD-10-CM | POA: Diagnosis not present

## 2015-12-13 DIAGNOSIS — J441 Chronic obstructive pulmonary disease with (acute) exacerbation: Secondary | ICD-10-CM | POA: Diagnosis present

## 2015-12-13 DIAGNOSIS — R000 Tachycardia, unspecified: Secondary | ICD-10-CM | POA: Diagnosis present

## 2015-12-13 DIAGNOSIS — Z9889 Other specified postprocedural states: Secondary | ICD-10-CM | POA: Diagnosis not present

## 2015-12-13 DIAGNOSIS — Z7901 Long term (current) use of anticoagulants: Secondary | ICD-10-CM | POA: Diagnosis not present

## 2015-12-13 DIAGNOSIS — Z823 Family history of stroke: Secondary | ICD-10-CM

## 2015-12-13 DIAGNOSIS — Z7951 Long term (current) use of inhaled steroids: Secondary | ICD-10-CM

## 2015-12-13 DIAGNOSIS — K219 Gastro-esophageal reflux disease without esophagitis: Secondary | ICD-10-CM | POA: Diagnosis present

## 2015-12-13 DIAGNOSIS — Z79899 Other long term (current) drug therapy: Secondary | ICD-10-CM

## 2015-12-13 DIAGNOSIS — D649 Anemia, unspecified: Secondary | ICD-10-CM | POA: Diagnosis present

## 2015-12-13 DIAGNOSIS — I447 Left bundle-branch block, unspecified: Secondary | ICD-10-CM | POA: Diagnosis present

## 2015-12-13 DIAGNOSIS — Z8546 Personal history of malignant neoplasm of prostate: Secondary | ICD-10-CM | POA: Diagnosis not present

## 2015-12-13 DIAGNOSIS — N183 Chronic kidney disease, stage 3 (moderate): Secondary | ICD-10-CM | POA: Diagnosis present

## 2015-12-13 DIAGNOSIS — Z8711 Personal history of peptic ulcer disease: Secondary | ICD-10-CM | POA: Diagnosis not present

## 2015-12-13 DIAGNOSIS — D500 Iron deficiency anemia secondary to blood loss (chronic): Secondary | ICD-10-CM | POA: Diagnosis present

## 2015-12-13 DIAGNOSIS — M6281 Muscle weakness (generalized): Secondary | ICD-10-CM

## 2015-12-13 LAB — CBC WITH DIFFERENTIAL/PLATELET
BASOS ABS: 0.1 10*3/uL (ref 0.0–0.1)
BASOS PCT: 0 %
EOS ABS: 0.3 10*3/uL (ref 0.0–0.7)
Eosinophils Relative: 2 %
HCT: 37.1 % — ABNORMAL LOW (ref 39.0–52.0)
HEMOGLOBIN: 11.4 g/dL — AB (ref 13.0–17.0)
Lymphocytes Relative: 12 %
Lymphs Abs: 1.7 10*3/uL (ref 0.7–4.0)
MCH: 29 pg (ref 26.0–34.0)
MCHC: 30.7 g/dL (ref 30.0–36.0)
MCV: 94.4 fL (ref 78.0–100.0)
Monocytes Absolute: 1.1 10*3/uL — ABNORMAL HIGH (ref 0.1–1.0)
Monocytes Relative: 8 %
NEUTROS PCT: 78 %
Neutro Abs: 10.9 10*3/uL — ABNORMAL HIGH (ref 1.7–7.7)
Platelets: 345 10*3/uL (ref 150–400)
RBC: 3.93 MIL/uL — AB (ref 4.22–5.81)
RDW: 14.4 % (ref 11.5–15.5)
WBC: 14.1 10*3/uL — AB (ref 4.0–10.5)

## 2015-12-13 LAB — PROTIME-INR
INR: 2.91
Prothrombin Time: 31 seconds — ABNORMAL HIGH (ref 11.4–15.2)

## 2015-12-13 LAB — BASIC METABOLIC PANEL
ANION GAP: 5 (ref 5–15)
BUN: 21 mg/dL — ABNORMAL HIGH (ref 6–20)
CHLORIDE: 101 mmol/L (ref 101–111)
CO2: 34 mmol/L — ABNORMAL HIGH (ref 22–32)
CREATININE: 1.16 mg/dL (ref 0.61–1.24)
Calcium: 9.2 mg/dL (ref 8.9–10.3)
GFR calc non Af Amer: >60 mL/min (ref >60)
Glucose, Bld: 142 mg/dL — ABNORMAL HIGH (ref 65–99)
Potassium: 4.4 mmol/L (ref 3.5–5.1)
SODIUM: 140 mmol/L (ref 135–145)

## 2015-12-13 LAB — I-STAT TROPONIN, ED: TROPONIN I, POC: 0 ng/mL (ref 0.00–0.08)

## 2015-12-13 LAB — BRAIN NATRIURETIC PEPTIDE: B Natriuretic Peptide: 158 pg/mL — ABNORMAL HIGH (ref 0.0–100.0)

## 2015-12-13 MED ORDER — BISACODYL 5 MG PO TBEC: 5.0000 mg | DELAYED_RELEASE_TABLET | Freq: Every day | ORAL | Status: DC | PRN

## 2015-12-13 MED ORDER — MOMETASONE FURO-FORMOTEROL FUM 200-5 MCG/ACT IN AERO
2.0000 | INHALATION_SPRAY | Freq: Two times a day (BID) | RESPIRATORY_TRACT | Status: DC
  Administered 2015-12-14 – 2015-12-17 (×7): 2 via RESPIRATORY_TRACT
  Filled 2015-12-13: qty 8.8

## 2015-12-13 MED ORDER — SODIUM CHLORIDE 0.9% FLUSH
3.0000 mL | Freq: Two times a day (BID) | INTRAVENOUS | Status: DC
  Administered 2015-12-14 – 2015-12-16 (×6): 3 mL via INTRAVENOUS

## 2015-12-13 MED ORDER — LEVOFLOXACIN IN D5W 750 MG/150ML IV SOLN
750.0000 mg | INTRAVENOUS | Status: DC
  Administered 2015-12-14 – 2015-12-16 (×4): 750 mg via INTRAVENOUS
  Filled 2015-12-13 (×3): qty 150

## 2015-12-13 MED ORDER — CARVEDILOL 6.25 MG PO TABS
9.3750 mg | ORAL_TABLET | Freq: Two times a day (BID) | ORAL | Status: DC
  Administered 2015-12-14: 9.375 mg via ORAL
  Filled 2015-12-13 (×4): qty 1

## 2015-12-13 MED ORDER — SODIUM CHLORIDE 0.9 % IV SOLN
250.0000 mL | INTRAVENOUS | Status: DC | PRN
  Administered 2015-12-14: 250 mL via INTRAVENOUS

## 2015-12-13 MED ORDER — ACETAMINOPHEN 650 MG RE SUPP: 650.0000 mg | Freq: Four times a day (QID) | RECTAL | Status: DC | PRN

## 2015-12-13 MED ORDER — PANTOPRAZOLE SODIUM 40 MG PO TBEC
40.0000 mg | DELAYED_RELEASE_TABLET | Freq: Every day | ORAL | Status: DC
  Administered 2015-12-14 – 2015-12-17 (×4): 40 mg via ORAL
  Filled 2015-12-13 (×4): qty 1

## 2015-12-13 MED ORDER — GUAIFENESIN ER 600 MG PO TB12
600.0000 mg | ORAL_TABLET | Freq: Two times a day (BID) | ORAL | Status: DC
  Administered 2015-12-14 (×2): 600 mg via ORAL
  Filled 2015-12-13 (×2): qty 1

## 2015-12-13 MED ORDER — METHYLPREDNISOLONE SODIUM SUCC 125 MG IJ SOLR
125.0000 mg | Freq: Once | INTRAMUSCULAR | Status: AC
  Administered 2015-12-13: 125 mg via INTRAVENOUS

## 2015-12-13 MED ORDER — METHYLPREDNISOLONE SODIUM SUCC 125 MG IJ SOLR
INTRAMUSCULAR | Status: AC
  Filled 2015-12-13: qty 2

## 2015-12-13 MED ORDER — PRAVASTATIN SODIUM 40 MG PO TABS
40.0000 mg | ORAL_TABLET | Freq: Every day | ORAL | Status: DC
  Administered 2015-12-14 – 2015-12-16 (×4): 40 mg via ORAL
  Filled 2015-12-13 (×4): qty 1

## 2015-12-13 MED ORDER — ALBUTEROL (5 MG/ML) CONTINUOUS INHALATION SOLN
10.0000 mg/h | INHALATION_SOLUTION | Freq: Once | RESPIRATORY_TRACT | Status: AC
  Administered 2015-12-13: 10 mg/h via RESPIRATORY_TRACT
  Filled 2015-12-13: qty 20

## 2015-12-13 MED ORDER — COLCHICINE 0.6 MG PO TABS
0.6000 mg | ORAL_TABLET | Freq: Every day | ORAL | Status: DC
  Administered 2015-12-14 – 2015-12-17 (×4): 0.6 mg via ORAL
  Filled 2015-12-13 (×4): qty 1

## 2015-12-13 MED ORDER — ZOLPIDEM TARTRATE 5 MG PO TABS
5.0000 mg | ORAL_TABLET | Freq: Every day | ORAL | Status: DC
  Administered 2015-12-14 – 2015-12-16 (×4): 5 mg via ORAL
  Filled 2015-12-13 (×4): qty 1

## 2015-12-13 MED ORDER — SODIUM CHLORIDE 0.9% FLUSH: 3.0000 mL | INTRAVENOUS | Status: DC | PRN

## 2015-12-13 MED ORDER — FERROUS SULFATE 325 (65 FE) MG PO TABS
325.0000 mg | ORAL_TABLET | Freq: Two times a day (BID) | ORAL | Status: DC
  Administered 2015-12-14 – 2015-12-17 (×6): 325 mg via ORAL
  Filled 2015-12-13 (×6): qty 1

## 2015-12-13 MED ORDER — HYDROCODONE-ACETAMINOPHEN 5-325 MG PO TABS: 1.0000 | ORAL_TABLET | ORAL | Status: DC | PRN

## 2015-12-13 MED ORDER — ASPIRIN 81 MG PO CHEW
81.0000 mg | CHEWABLE_TABLET | Freq: Every day | ORAL | Status: DC
  Administered 2015-12-14 – 2015-12-17 (×4): 81 mg via ORAL
  Filled 2015-12-13 (×4): qty 1

## 2015-12-13 MED ORDER — IPRATROPIUM-ALBUTEROL 0.5-2.5 (3) MG/3ML IN SOLN: 3.0000 mL | RESPIRATORY_TRACT | Status: DC | PRN

## 2015-12-13 MED ORDER — POLYETHYLENE GLYCOL 3350 17 G PO PACK: 17.0000 g | PACK | Freq: Every day | ORAL | Status: DC | PRN

## 2015-12-13 MED ORDER — ONDANSETRON HCL 4 MG PO TABS: 4.0000 mg | ORAL_TABLET | Freq: Four times a day (QID) | ORAL | Status: DC | PRN

## 2015-12-13 MED ORDER — ACETAMINOPHEN 325 MG PO TABS: 650.0000 mg | ORAL_TABLET | Freq: Four times a day (QID) | ORAL | Status: DC | PRN

## 2015-12-13 MED ORDER — ONDANSETRON HCL 4 MG/2ML IJ SOLN: 4.0000 mg | Freq: Four times a day (QID) | INTRAMUSCULAR | Status: DC | PRN

## 2015-12-13 MED ORDER — IPRATROPIUM-ALBUTEROL 0.5-2.5 (3) MG/3ML IN SOLN
3.0000 mL | RESPIRATORY_TRACT | Status: AC
Start: 2015-12-13 — End: 2015-12-13
  Administered 2015-12-13 (×3): 3 mL via RESPIRATORY_TRACT
  Filled 2015-12-13: qty 3
  Filled 2015-12-13: qty 6

## 2015-12-13 MED ORDER — GABAPENTIN 100 MG PO CAPS
600.0000 mg | ORAL_CAPSULE | Freq: Three times a day (TID) | ORAL | Status: DC
  Administered 2015-12-14 – 2015-12-17 (×6): 600 mg via ORAL
  Filled 2015-12-13 (×6): qty 6

## 2015-12-13 MED ORDER — BENZONATATE 100 MG PO CAPS: 100.0000 mg | ORAL_CAPSULE | Freq: Three times a day (TID) | ORAL | 0 refills | Status: AC

## 2015-12-13 MED ORDER — METHYLPREDNISOLONE SODIUM SUCC 125 MG IJ SOLR
60.0000 mg | Freq: Four times a day (QID) | INTRAMUSCULAR | Status: DC
  Administered 2015-12-14 – 2015-12-16 (×11): 60 mg via INTRAVENOUS
  Filled 2015-12-13 (×11): qty 2

## 2015-12-13 NOTE — ED Notes (Signed)
Pt ambulated around nursing station after O2 cut off- pt O2 sat dropped from 95-85 while walking after an initial bump from 92-95- returned to room orthopnic and tachypnic

## 2015-12-13 NOTE — ED Notes (Signed)
Pt resting comfortably, as resp treatment delivered. Solumedrol 125 mg given  And reported to floor RN, Velna Hatchet, RN

## 2015-12-13 NOTE — ED Notes (Signed)
O2 stopped to ascertain Pulse Ox on rm air - pt is not on home O2 and states he does not meet the requirements for home O2.

## 2015-12-13 NOTE — ED Triage Notes (Signed)
Pt arrived to er by [facility] with sob, pt states that he was discharged from hospital yesterday for COPD excerebration, started becoming more sob today, denies any cough, fever,

## 2015-12-13 NOTE — ED Notes (Signed)
Call for report- unable to take at this time

## 2015-12-13 NOTE — ED Notes (Signed)
MD at bedside. 

## 2015-12-13 NOTE — ED Notes (Signed)
Specimen to lab, call to respiratory

## 2015-12-13 NOTE — H&P (Signed)
History and Physical    Ryan Shea [id number] DOB: Jan 18, 1946 DOA: 12/13/2015  PCP: [facility]   Patient coming from: Home   Chief Complaint: Dyspnea, respiratory distress  HPI: Ryan Shea is a 70 y.o. male with medical history significant for COPD, paroxysmal atrial fibrillation, peptic ulcer disease with chronic iron deficiency anemia, severe aortic stenosis status-post TAVR, and hypertension who presents to the emergency department for evaluation of dyspnea. Patient was just discharged from the hospital yesterday after management for acute exacerbation and COPD. He was discharged home with doxycycline and a prednisone taper, and while he reports making good improvement in the hospital and feeling well at time of discharge, states that his condition quickly re-worsened back at home. He was unable to qualify for oxygen in the hospital, and reports that a medical supply company was to set him up for an overnight study at home to see if he can qualify. Unfortunately, despite use of his nebulized breathing treatments at home, patient was significantly dyspneic with minimal exertion. He denies recent fevers, chills, or cough. He also denies chest pain or palpitations, denies any increase in his mild chronic lower extremity edema, and denies orthopnea.  ED Course: Upon arrival to the ED, patient is found to be afebrile, requiring 2 L/m of supplemental oxygen maintaining saturations in the mid 90s, mildly tachycardic in the low 100s, and with vitals otherwise stable. EKG demonstrates sinus tachycardia with rate 105 and chronic left bundle branch block. Chest x-ray is notable for stable cardiomegaly but no acute cardiopulmonary disease. Chemistry panel features a serum bicarbonate of 34 and CBC is notable for a stable leukocytosis of 14,100 and a stable normocytic anemia with hemoglobin of 11.4. Troponin is undetectable and BNP is elevated 258. Patient was treated with  DuoNeb in the emergency department and experienced some subjective improvement with this, but quickly desaturated to the mid 80s on room air with any activity. Patient will be admitted to the medical surgical unit for ongoing evaluation and management of acute exacerbation and COPD with hypoxia.  Review of Systems:  All other systems reviewed and apart from HPI, are negative.  Past Medical History:  Diagnosis Date  . 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 Angelena Form  . Asthma   . Atrial flutter ([place])   . Chronic anemia   . CKD (chronic kidney disease) stage 3, GFR 30-59 ml/min   . COPD (chronic obstructive pulmonary disease) ([place])   . Essential hypertension   . Gout   . Helicobacter pylori gastritis 05/2014  . Lung nodule   . Mitral regurgitation   . Multiple duodenal ulcers 05/2014  . Prostate cancer ([place])   . Thyroid nodule     Past Surgical History:  Procedure Laterality Date  . CARDIAC CATHETERIZATION N/A 03/26/2015   Procedure: Right/Left Heart Cath and Coronary Angiography;  Surgeon: Burnell Blanks, MD;  Location: [place] [facility];  Service: Cardiovascular;  Laterality: N/A;  . COLONOSCOPY    . COLONOSCOPY N/A 08/25/2014   Rehman:examination performed to cecum. moderate number of diverticula at sigmoid colon without stigmata of bleed small external hemorrhoids and anal papillae  . ESOPHAGOGASTRODUODENOSCOPY N/A 05/31/2014   H PYLORI GASTRITIS, DUODENAL ULCERS  . GIVENS CAPSULE STUDY N/A 04/18/2015   Procedure: GIVENS CAPSULE STUDY;  Surgeon: Danie Binder, MD;  [facility];  Service: Endoscopy;  Laterality: N/A;  . HERNIA REPAIR    . PROSTATE SURGERY    .  TEE WITHOUT CARDIOVERSION N/A 07/08/2015   Procedure: TRANSESOPHAGEAL ECHOCARDIOGRAM (TEE);  Surgeon: Burnell Blanks, MD;  Location: [place];  Service: Open Heart Surgery;  Laterality: N/A;  . TRANSCATHETER AORTIC VALVE REPLACEMENT, TRANSFEMORAL N/A 07/08/2015   Procedure: TRANSCATHETER  AORTIC VALVE REPLACEMENT, TRANSFEMORAL;  Surgeon: Burnell Blanks, MD;  Location: [place];  Service: Open Heart Surgery;  Laterality: N/A;     reports that he quit smoking about 37 years ago. His smoking use included Cigarettes. He started smoking about 56 years ago. He has a 40.00 pack-year smoking history. He has quit using smokeless tobacco. He reports that he does not drink alcohol or use drugs.  Allergies  Allergen Reactions  . Gabapentin Other (See Comments)    Reaction:  GI upset  Daughter states that pt is only allergic to 300mg  capsules.      Family History  Problem Relation Age of Onset  . Stroke Mother   . Prostate cancer Brother     [name]  . Cancer Other   . Prostate cancer Brother     [name]  . Colon cancer Neg Hx   . Colon polyps Neg Hx      Prior to Admission medications   Medication Sig Start Date End Date Taking? Authorizing Provider  albuterol (PROVENTIL HFA;VENTOLIN HFA) 108 (90 Base) MCG/ACT inhaler Inhale 1-2 puffs into the lungs every 6 (six) hours as needed for wheezing or shortness of breath.   Yes Historical Provider, MD  albuterol (PROVENTIL) (2.5 MG/3ML) 0.083% nebulizer solution Take 3 mLs (2.5 mg total) by nebulization every 4 (four) hours as needed for wheezing or shortness of breath. 03/03/15  Yes Kathie Dike, MD  aspirin 81 MG chewable tablet Chew 1 tablet (81 mg total) by mouth daily. 03/27/15  Yes Florencia Reasons, MD  carvedilol (COREG) 6.25 MG tablet Take 1.5 tablets (9.375 mg total) by mouth 2 (two) times daily. 07/21/15  Yes Lendon Colonel, NP  colchicine 0.6 MG tablet Take 0.6 mg by mouth daily.   Yes Historical Provider, MD  doxycycline (VIBRAMYCIN) 100 MG capsule Take 100 mg by mouth 2 (two) times daily.  12/08/15 12/18/15 Yes Historical Provider, MD  ferrous sulfate 325 (65 FE) MG tablet Take 325 mg by mouth 2 (two) times daily with a meal.    Yes Historical Provider, MD  Fluticasone-Salmeterol (ADVAIR DISKUS) 250-50 MCG/DOSE AEPB Inhale 1  puff into the lungs daily. 08/25/14  Yes Kathie Dike, MD  furosemide (LASIX) 40 MG tablet Take 40 mg by mouth daily as needed for edema.   Yes Historical Provider, MD  gabapentin (NEURONTIN) 600 MG tablet Take 600 mg by mouth 3 (three) times daily.   Yes Historical Provider, MD  guaiFENesin (MUCINEX) 600 MG 12 hr tablet Take 1 tablet (600 mg total) by mouth 2 (two) times daily. 12/12/15  Yes Kathie Dike, MD  pantoprazole (PROTONIX) 40 MG tablet Take 1 tablet (40 mg total) by mouth daily. 08/25/14  Yes Kathie Dike, MD  pravastatin (PRAVACHOL) 40 MG tablet Take 40 mg by mouth at bedtime.    Yes Historical Provider, MD  warfarin (COUMADIN) 3 MG tablet Take 3.5 mg by mouth daily. Pt takes with one-half of a 1mg  tablet.   Yes Historical Provider, MD  zolpidem (AMBIEN) 10 MG tablet Take 10 mg by mouth at bedtime.   Yes Historical Provider, MD  benzonatate (TESSALON) 100 MG capsule Take 1 capsule (100 mg total) by mouth every 8 (eight) hours. 12/13/15   Deno Etienne, DO  warfarin (COUMADIN) 1 MG tablet Take 0.5 mg by mouth daily. Pt takes with a 3mg  tablet.    Historical Provider, MD    Physical Exam: Vitals:   12/13/15 2100 12/13/15 2130 12/13/15 2151 12/13/15 2220  BP: (!) 137/53 114/63  126/67  Pulse: 102 97  99  Resp: 19 13  17   Temp:      TempSrc:      SpO2: 98% 98% 95% 99%  Weight:      Height:          Constitutional: Respiratory distress with accessory muscle recruitment, no pallor Eyes: PERTLA, lids and conjunctivae normal ENMT: Mucous membranes are moist. Posterior pharynx clear of any exudate or lesions.   Neck: normal, supple, no masses, no thyromegaly Respiratory: Wheezes throughout, increased WOB. Symmetric chest wall expansion, no pallor.  Cardiovascular: Rate ~110 and regular. 1+ b/l pretibial edema. No significant JVD. Abdomen: No distension, no tenderness, no masses palpated. Bowel sounds normal.  Musculoskeletal: no clubbing / cyanosis. No joint deformity upper and  lower extremities. Normal muscle tone.  Skin: no significant rashes, lesions, ulcers. Warm, dry, well-perfused. Neurologic: CN 2-12 grossly intact. Sensation intact, DTR normal. Strength 5/5 in all 4 limbs.  Psychiatric: Normal judgment and insight. Alert and oriented x 3. Normal mood and affect.     Labs on Admission: I have personally reviewed following labs and imaging studies  CBC:  Recent Labs Lab 12/11/15 1445 12/12/15 0627 12/13/15 2016  WBC 13.7* 13.4* 14.1*  NEUTROABS  --   --  10.9*  HGB 12.0* 11.3* 11.4*  HCT 38.2* 36.0* 37.1*  MCV 93.2 94.2 94.4  PLT 372 346 [id number]   Basic Metabolic Panel:  Recent Labs Lab 12/11/15 1445 12/12/15 0627 12/13/15 2016  NA 140 141 140  K 3.9 4.8 4.4  CL 100* 100* 101  CO2 35* 37* 34*  GLUCOSE 162* 115* 142*  BUN 17 15 21*  CREATININE 1.13 0.91 1.16  CALCIUM 9.1 9.1 9.2   GFR: Estimated Creatinine Clearance: 66.8 mL/min (by C-G formula based on SCr of 1.16 mg/dL). Liver Function Tests: No results for input(s): AST, ALT, ALKPHOS, BILITOT, PROT, ALBUMIN in the last 168 hours. No results for input(s): LIPASE, AMYLASE in the last 168 hours. No results for input(s): AMMONIA in the last 168 hours. Coagulation Profile:  Recent Labs Lab 12/11/15 1445 12/12/15 0627  INR 2.84 2.99   Cardiac Enzymes: No results for input(s): CKTOTAL, CKMB, CKMBINDEX, TROPONINI in the last 168 hours. BNP (last 3 results) No results for input(s): PROBNP in the last 8760 hours. HbA1C: No results for input(s): HGBA1C in the last 72 hours. CBG: No results for input(s): GLUCAP in the last 168 hours. Lipid Profile: No results for input(s): CHOL, HDL, LDLCALC, TRIG, CHOLHDL, LDLDIRECT in the last 72 hours. Thyroid Function Tests: No results for input(s): TSH, T4TOTAL, FREET4, T3FREE, THYROIDAB in the last 72 hours. Anemia Panel: No results for input(s): VITAMINB12, FOLATE, FERRITIN, TIBC, IRON, RETICCTPCT in the last 72 hours. Urine analysis:      Component Value Date/Time   COLORURINE YELLOW 07/04/2015 [place] 07/04/2015 1122   LABSPEC 1.023 07/04/2015 1122   PHURINE 5.5 07/04/2015 1122   GLUCOSEU NEGATIVE 07/04/2015 1122   HGBUR NEGATIVE 07/04/2015 1122   BILIRUBINUR NEGATIVE 07/04/2015 1122   KETONESUR NEGATIVE 07/04/2015 1122   PROTEINUR NEGATIVE 07/04/2015 1122   NITRITE NEGATIVE 07/04/2015 1122   LEUKOCYTESUR NEGATIVE 07/04/2015 1122   Sepsis Labs: @LABRCNTIP (procalcitonin:4,lacticidven:4) )No results found for this or  any previous visit (from the past 240 hour(s)).   Radiological Exams on Admission: Dg Chest Port 1 View  Result Date: 12/13/2015 CLINICAL DATA:  Shortness of breath for 6 months, worse today. Discharge from hospital yesterday because of COPD exacerbation. EXAM: PORTABLE CHEST 1 VIEW COMPARISON:  12/11/2015 FINDINGS: Cardiac enlargement. No vascular congestion or edema. No blunting of costophrenic angles. No pneumothorax. No focal airspace disease or consolidation. Mediastinal contours appear intact. IMPRESSION: Cardiac enlargement.  No evidence of active pulmonary disease. Electronically Signed   By: Lucienne Capers M.D.   On: 12/13/2015 21:06    EKG: Independently reviewed. Sinus tachycardia (rate 105), chronic LBBB  Assessment/Plan  1. Acute COPD exacerbation with acute hypoxic respiratory failure  - Presents with acute exacerbation in COPD as he was tapering down his prednisone  - He is requiring supplemental O2 in order to maintain adequate saturation with mild activity - Continue scheduled ICS/LABA with Dulera; increase systemic steroid: Given 125 mg IV Solu-Medrol and will continue with 60 mg IV q6h for now; abx with Levaquin given severe exacerbation requiring hospitalization  - Continue prn DuoNeb  - Monitor with continuous pulse oximetry and titrate FiO2 to maintain sat >92%   2. Paroxysmal atrial fibrillation  - In a sinus rhythm at time of admission  - CHADS-VASC is at  least 2 (age, HTN); continue [facility] with warfarin   3. Hypertension  - At goal currently  - Continue Coreg as tolerated    4. GERD - Hx of bleeding duodenal ulcers in March 2016  - Asymptomatic currently  - Continue Protonix  5. Normocytic anemia  - Hgb stable at 11.4 with no s/s of significant bleeding  - Attributed to IDA; continue iron-supplementation     DVT prophylaxis: warfarin  Code Status: Full  Family Communication: Daughter updated at bedside   Disposition Plan: Admit to med-surg  Consults called: None Admission status: Inpatient; pt with new hypoxia, no home O2, hypoxic despite aggressive outpt tx with steroids, abx, and nebs. Will need >2 midnights in order to stabilize respiratory condition and arrange safe discharge plan.     Vianne Bulls, MD [facility] Pager [phone]  If 7PM-7AM, please contact night-coverage [email or web address] Password [email or web address]  12/13/2015, 10:24 PM

## 2015-12-13 NOTE — Progress Notes (Signed)
This encounter was created in error - please disregard.

## 2015-12-13 NOTE — ED Provider Notes (Signed)
[facility] DEPT Provider Note   CSN: [id number] Arrival date & time: 12/13/15  2003 By signing my name below, I, Georgette Shell, attest that this documentation has been prepared under the direction and in the presence of Deno Etienne, DO. Electronically Signed: Georgette Shell, ED Scribe. 12/13/15. 8:21 PM.  History   Chief Complaint Chief Complaint  Patient presents with  . Shortness of Breath   HPI Comments: Ryan Shea is a 70 y.o. male with h/o asthma and COPD who presents to the Emergency Department by EMS complaining of shortness of breath onset earlier today. Pt has h/o COPD and notes that his symptoms at this time are consistent with his regular COPD exacerbations. Pt reports that he was discharged from the hospital yesterday for his symptoms and notes that it feels like it has became worse since. He was given breathing treatments and steroids which he has been taking but he notes that he is unsure how much of his steroids he should take. His prescriptions have not provided him any relief. He denies leg swelling, cough, fever, or any other associated symptoms.  The history is provided by the patient. No language interpreter was used.  Shortness of Breath  This is a recurrent problem. The problem occurs intermittently.The current episode started 6 to 12 hours ago. The problem has not changed since onset.Pertinent negatives include no fever, no headaches, no cough, no chest pain, no vomiting, no abdominal pain, no rash and no leg swelling. He has tried oral steroids and ipratropium inhalers for the symptoms. The treatment provided no relief. He has had prior hospitalizations. He has had prior ED visits. Associated medical issues include asthma and COPD.    Past Medical History:  Diagnosis Date  . 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 Angelena Form  . Asthma   . Atrial flutter ([place])   . Chronic anemia   . CKD (chronic kidney disease) stage 3, GFR 30-59 ml/min   . COPD (chronic  obstructive pulmonary disease) ([place])   . Essential hypertension   . Gout   . Helicobacter pylori gastritis 05/2014  . Lung nodule   . Mitral regurgitation   . Multiple duodenal ulcers 05/2014  . Prostate cancer ([place])   . Thyroid nodule     Patient Active Problem List   Diagnosis Date Noted  . Acute exacerbation of chronic obstructive pulmonary disease (COPD) ([place]) 12/13/2015  . S/P TAVR (transcatheter aortic valve replacement) 12/11/2015  . GERD (gastroesophageal reflux disease) 12/11/2015  . Thyroid nodule 11/01/2015  . Lung nodule 11/01/2015  . Iron deficiency anemia due to chronic blood loss 08/12/2015  . Severe aortic stenosis 07/08/2015  . Blood in stool   . Acute respiratory failure with hypoxia ([place]) 03/22/2015  . NSTEMI (non-ST elevated myocardial infarction) ([place]) 03/22/2015  . CKD (chronic kidney disease) stage 3, GFR 30-59 ml/min 03/22/2015  . Morbid obesity ([place]) 03/22/2015  . Elevated troponin   . COPD exacerbation ([place]) 03/02/2015  . Acute renal failure ([place]) 08/24/2014  . Chronic anticoagulation 08/24/2014  . GI bleed 08/23/2014  . Rectal bleeding 08/23/2014  . Colon cancer screening 07/11/2014  . Multiple duodenal ulcers 05/30/2014  . Aortic stenosis 04/23/2013  . Mitral regurgitation 04/23/2013  . Cardiomyopathy ([place]) 04/23/2013  . Paroxysmal atrial flutter ([place]) 04/23/2013  . HTN (hypertension) 04/20/2013  . High cholesterol 04/20/2013  . COPD (chronic obstructive pulmonary disease) ([place]) 04/20/2013  . Prostate cancer ([place]) 04/20/2013  . Backache, unspecified 04/20/2013  . Body mass  index 40.0-44.9, adult ([place]) 04/20/2013    Past Surgical History:  Procedure Laterality Date  . CARDIAC CATHETERIZATION N/A 03/26/2015   Procedure: Right/Left Heart Cath and Coronary Angiography;  Surgeon: Burnell Blanks, MD;  Location: [facility];  Service: Cardiovascular;  Laterality: N/A;  . COLONOSCOPY    . COLONOSCOPY N/A 08/25/2014    Rehman:examination performed to cecum. moderate number of diverticula at sigmoid colon without stigmata of bleed small external hemorrhoids and anal papillae  . ESOPHAGOGASTRODUODENOSCOPY N/A 05/31/2014   H PYLORI GASTRITIS, DUODENAL ULCERS  . GIVENS CAPSULE STUDY N/A 04/18/2015   Procedure: GIVENS CAPSULE STUDY;  Surgeon: Danie Binder, MD;  Location[facility];  Service: Endoscopy;  Laterality: N/A;  . HERNIA REPAIR    . PROSTATE SURGERY    . TEE WITHOUT CARDIOVERSION N/A 07/08/2015   Procedure: TRANSESOPHAGEAL ECHOCARDIOGRAM (TEE);  Surgeon: Burnell Blanks, MD;  Location: [facility];  Service: Open Heart Surgery;  Laterality: N/A;  . TRANSCATHETER AORTIC VALVE REPLACEMENT, TRANSFEMORAL N/A 07/08/2015   Procedure: TRANSCATHETER AORTIC VALVE REPLACEMENT, TRANSFEMORAL;  Surgeon: Burnell Blanks, MD;  Location: [facility];  Service: Open Heart Surgery;  Laterality: N/A;       Home Medications    Prior to Admission medications   Medication Sig Start Date End Date Taking? Authorizing Provider  albuterol (PROVENTIL HFA;VENTOLIN HFA) 108 (90 Base) MCG/ACT inhaler Inhale 1-2 puffs into the lungs every 6 (six) hours as needed for wheezing or shortness of breath.   Yes Historical Provider, MD  albuterol (PROVENTIL) (2.5 MG/3ML) 0.083% nebulizer solution Take 3 mLs (2.5 mg total) by nebulization every 4 (four) hours as needed for wheezing or shortness of breath. 03/03/15  Yes Kathie Dike, MD  aspirin 81 MG chewable tablet Chew 1 tablet (81 mg total) by mouth daily. 03/27/15  Yes Florencia Reasons, MD  carvedilol (COREG) 6.25 MG tablet Take 1.5 tablets (9.375 mg total) by mouth 2 (two) times daily. 07/21/15  Yes Lendon Colonel, NP  colchicine 0.6 MG tablet Take 0.6 mg by mouth daily.   Yes Historical Provider, MD  doxycycline (VIBRAMYCIN) 100 MG capsule Take 100 mg by mouth 2 (two) times daily.  12/08/15 12/18/15 Yes Historical Provider, MD  ferrous sulfate 325 (65 FE) MG tablet Take 325 mg by mouth 2  (two) times daily with a meal.    Yes Historical Provider, MD  Fluticasone-Salmeterol (ADVAIR DISKUS) 250-50 MCG/DOSE AEPB Inhale 1 puff into the lungs daily. 08/25/14  Yes Kathie Dike, MD  furosemide (LASIX) 40 MG tablet Take 40 mg by mouth daily as needed for edema.   Yes Historical Provider, MD  gabapentin (NEURONTIN) 600 MG tablet Take 600 mg by mouth 3 (three) times daily.   Yes Historical Provider, MD  guaiFENesin (MUCINEX) 600 MG 12 hr tablet Take 1 tablet (600 mg total) by mouth 2 (two) times daily. 12/12/15  Yes Kathie Dike, MD  pantoprazole (PROTONIX) 40 MG tablet Take 1 tablet (40 mg total) by mouth daily. 08/25/14  Yes Kathie Dike, MD  pravastatin (PRAVACHOL) 40 MG tablet Take 40 mg by mouth at bedtime.    Yes Historical Provider, MD  warfarin (COUMADIN) 1 MG tablet Take 0.5 mg by mouth daily. Pt takes with a 3mg  tablet.   Yes Historical Provider, MD  warfarin (COUMADIN) 3 MG tablet Take 3 mg by mouth daily. Pt takes with one-half of a 1mg  tablet.   Yes Historical Provider, MD  zolpidem (AMBIEN) 10 MG tablet Take 10 mg by mouth  at bedtime.   Yes Historical Provider, MD  benzonatate (TESSALON) 100 MG capsule Take 1 capsule (100 mg total) by mouth every 8 (eight) hours. 12/13/15   Deno Etienne, DO    Family History Family History  Problem Relation Age of Onset  . Stroke Mother   . Prostate cancer Brother     [name]  . Cancer Other   . Prostate cancer Brother     [name]  . Colon cancer Neg Hx   . Colon polyps Neg Hx     Social History Social History  Substance Use Topics  . Smoking status: Former Smoker    Packs/day: 2.00    Years: 20.00    Types: Cigarettes    Start date: 09/01/1959    Quit date: 09/01/1978  . Smokeless tobacco: Former Systems developer     Comment: quit 30 years ago  . Alcohol use No     Allergies   Gabapentin   Review of Systems Review of Systems  Constitutional: Negative for chills and fever.  HENT: Negative for congestion and facial swelling.   Eyes:  Negative for discharge and visual disturbance.  Respiratory: Positive for shortness of breath. Negative for cough.   Cardiovascular: Negative for chest pain, palpitations and leg swelling.  Gastrointestinal: Negative for abdominal pain, diarrhea and vomiting.  Musculoskeletal: Negative for arthralgias and myalgias.  Skin: Negative for color change and rash.  Neurological: Negative for tremors, syncope and headaches.  Psychiatric/Behavioral: Negative for confusion and dysphoric mood.  All other systems reviewed and are negative.    Physical Exam Updated Vital Signs BP 139/74 (BP Location: Left Arm)   Pulse 99   Temp 97.7 F (36.5 C) (Oral)   Resp 15   Ht 5\' 6"  (1.676 m)   Wt 240 lb 4.8 oz (109 kg)   SpO2 100%   BMI 38.79 kg/m   Physical Exam  Constitutional: He is oriented to person, place, and time. He appears well-developed and well-nourished.  HENT:  Head: Normocephalic and atraumatic.  Eyes: EOM are normal. Pupils are equal, round, and reactive to light.  Neck: Normal range of motion. Neck supple. No JVD present.  Cardiovascular: Normal rate and regular rhythm.  Exam reveals no gallop and no friction rub.   No murmur heard. Pulmonary/Chest: No respiratory distress. He has wheezes.  Diffuse wheezes, prolonged expiration.  Abdominal: He exhibits no distension. There is no rebound and no guarding.  Musculoskeletal: Normal range of motion. He exhibits edema.  1+ edema to BLEs.  Neurological: He is alert and oriented to person, place, and time.  Skin: No rash noted. No pallor.  Psychiatric: He has a normal mood and affect. His behavior is normal.  Nursing note and vitals reviewed.  ED Treatments / Results  DIAGNOSTIC STUDIES: Oxygen Saturation is 97% on [place] 2L, adequate by my interpretation.    COORDINATION OF CARE: 8:09 PM Discussed treatment plan with pt at bedside and pt agreed to plan.  Labs (all labs ordered are listed, but only abnormal results are  displayed) Labs Reviewed  CBC WITH DIFFERENTIAL/PLATELET - Abnormal; Notable for the following:       Result Value   WBC 14.1 (*)    RBC 3.93 (*)    Hemoglobin 11.4 (*)    HCT 37.1 (*)    Neutro Abs 10.9 (*)    Monocytes Absolute 1.1 (*)    All other components within normal limits  BASIC METABOLIC PANEL - Abnormal; Notable for the following:    CO2 34 (*)  Glucose, Bld 142 (*)    BUN 21 (*)    All other components within normal limits  BRAIN NATRIURETIC PEPTIDE - Abnormal; Notable for the following:    B Natriuretic Peptide 158.0 (*)    All other components within normal limits  PROTIME-INR - Abnormal; Notable for the following:    Prothrombin Time 31.0 (*)    All other components within normal limits  CULTURE, EXPECTORATED SPUTUM-ASSESSMENT  BASIC METABOLIC PANEL  I-STAT TROPOININ, ED    EKG  EKG Interpretation  Date/Time:  Saturday December 13 2015 20:05:19 EDT Ventricular Rate:  105 PR Interval:    QRS Duration: 148 QT Interval:  378 QTC Calculation: 500 R Axis:   17 Text Interpretation:  Sinus tachycardia Left bundle branch block Baseline wander in lead(s) V2 No significant change since last tracing Confirmed by Oz Gammel MD, DANIEL [phone]) on 12/13/2015 8:07:53 PM       Radiology Dg Chest Port 1 View  Result Date: 12/13/2015 CLINICAL DATA:  Shortness of breath for 6 months, worse today. Discharge from hospital yesterday because of COPD exacerbation. EXAM: PORTABLE CHEST 1 VIEW COMPARISON:  12/11/2015 FINDINGS: Cardiac enlargement. No vascular congestion or edema. No blunting of costophrenic angles. No pneumothorax. No focal airspace disease or consolidation. Mediastinal contours appear intact. IMPRESSION: Cardiac enlargement.  No evidence of active pulmonary disease. Electronically Signed   By: Lucienne Capers M.D.   On: 12/13/2015 21:06    Procedures Procedures (including critical care time)  Medications Ordered in ED Medications  sodium chloride flush (NS)  0.9 % injection 3 mL (not administered)  sodium chloride flush (NS) 0.9 % injection 3 mL (not administered)  0.9 %  sodium chloride infusion (not administered)  acetaminophen (TYLENOL) tablet 650 mg (not administered)    Or  acetaminophen (TYLENOL) suppository 650 mg (not administered)  HYDROcodone-acetaminophen (NORCO/VICODIN) 5-325 MG per tablet 1-2 tablet (not administered)  polyethylene glycol (MIRALAX / GLYCOLAX) packet 17 g (not administered)  bisacodyl (DULCOLAX) EC tablet 5 mg (not administered)  ondansetron (ZOFRAN) tablet 4 mg (not administered)    Or  ondansetron (ZOFRAN) injection 4 mg (not administered)  ipratropium-albuterol (DUONEB) 0.5-2.5 (3) MG/3ML nebulizer solution 3 mL (not administered)  levofloxacin (LEVAQUIN) IVPB 750 mg (not administered)  methylPREDNISolone sodium succinate (SOLU-MEDROL) 125 mg/2 mL injection 60 mg (not administered)  methylPREDNISolone sodium succinate (SOLU-MEDROL) 125 mg/2 mL injection (not administered)  gabapentin (NEURONTIN) capsule 600 mg (not administered)  zolpidem (AMBIEN) tablet 5 mg (not administered)  colchicine tablet 0.6 mg (not administered)  ferrous sulfate tablet 325 mg (not administered)  guaiFENesin (MUCINEX) 12 hr tablet 600 mg (not administered)  carvedilol (COREG) tablet 9.375 mg (not administered)  aspirin chewable tablet 81 mg (not administered)  mometasone-formoterol (DULERA) 200-5 MCG/ACT inhaler 2 puff (not administered)  pantoprazole (PROTONIX) EC tablet 40 mg (not administered)  pravastatin (PRAVACHOL) tablet 40 mg (not administered)  ipratropium-albuterol (DUONEB) 0.5-2.5 (3) MG/3ML nebulizer solution 3 mL (3 mLs Nebulization Given 12/13/15 2040)  albuterol (PROVENTIL,VENTOLIN) solution continuous neb (10 mg/hr Nebulization Given 12/13/15 2151)  methylPREDNISolone sodium succinate (SOLU-MEDROL) 125 mg/2 mL injection 125 mg (125 mg Intravenous Given 12/13/15 2234)     Initial Impression / Assessment and Plan / ED  Course  I have reviewed the triage vital signs and the nursing notes.  Pertinent labs & imaging results that were available during my care of the patient were reviewed by me and considered in my medical decision making (see chart for details).  Clinical Course    70  yo M With a chief complaint of a COPD exacerbation. He was just discharged from the hospital yesterday. Had sudden worsening of his symptoms. Patient was given 3 DuoNeb's back-to-back with significant improvement. I think that discharge the patient however he became acutely hypoxic when he ambulated. Placed on a continuous. Admit.    CRITICAL CARE Performed by: Cecilio Asper   Total critical care time: 80 minutes  Critical care time was exclusive of separately billable procedures and treating other patients.  Critical care was necessary to treat or prevent imminent or life-threatening deterioration.  Critical care was time spent personally by me on the following activities: development of treatment plan with patient and/or surrogate as well as nursing, discussions with consultants, evaluation of patient's response to treatment, examination of patient, obtaining history from patient or surrogate, ordering and performing treatments and interventions, ordering and review of laboratory studies, ordering and review of radiographic studies, pulse oximetry and re-evaluation of patient's condition.   Final Clinical Impressions(s) / ED Diagnoses   Final diagnoses:  COPD exacerbation ([place])   I personally performed the services described in this documentation, which was scribed in my presence. The recorded information has been reviewed and is accurate.    New Prescriptions Current Discharge Medication List    START taking these medications   Details  benzonatate (TESSALON) 100 MG capsule Take 1 capsule (100 mg total) by mouth every 8 (eight) hours. Qty: 21 capsule, Refills: [place], DO 12/13/15  [id number]

## 2015-12-13 NOTE — Discharge Instructions (Signed)
Use your breathing treatment every 4 hours while awake. Return if you need to use more often, get a fever, or have sudden worsening sob.

## 2015-12-13 NOTE — ED Notes (Signed)
hospitalist in to assess  

## 2015-12-13 NOTE — ED Notes (Addendum)
Call for report- nurse is :tied up" and will call back

## 2015-12-14 DIAGNOSIS — D649 Anemia, unspecified: Secondary | ICD-10-CM

## 2015-12-14 LAB — BASIC METABOLIC PANEL
ANION GAP: 5 (ref 5–15)
BUN: 21 mg/dL — ABNORMAL HIGH (ref 6–20)
CHLORIDE: 99 mmol/L — AB (ref 101–111)
CO2: 35 mmol/L — AB (ref 22–32)
Calcium: 9.1 mg/dL (ref 8.9–10.3)
Creatinine, Ser: 1.03 mg/dL (ref 0.61–1.24)
GFR calc non Af Amer: >60 mL/min (ref >60)
Glucose, Bld: 142 mg/dL — ABNORMAL HIGH (ref 65–99)
POTASSIUM: 5 mmol/L (ref 3.5–5.1)
SODIUM: 139 mmol/L (ref 135–145)

## 2015-12-14 LAB — GLUCOSE, CAPILLARY: GLUCOSE-CAPILLARY: 129 mg/dL — AB (ref 65–99)

## 2015-12-14 LAB — PROTIME-INR
INR: 2.93
PROTHROMBIN TIME: 31.2 s — AB (ref 11.4–15.2)

## 2015-12-14 MED ORDER — WARFARIN SODIUM 2.5 MG PO TABS
ORAL_TABLET | ORAL | Status: AC
  Filled 2015-12-14: qty 1

## 2015-12-14 MED ORDER — WARFARIN - PHARMACIST DOSING INPATIENT
Status: DC
  Administered 2015-12-15 – 2015-12-16 (×2)

## 2015-12-14 MED ORDER — IPRATROPIUM-ALBUTEROL 0.5-2.5 (3) MG/3ML IN SOLN
3.0000 mL | Freq: Four times a day (QID) | RESPIRATORY_TRACT | Status: DC
  Administered 2015-12-14 – 2015-12-15 (×6): 3 mL via RESPIRATORY_TRACT
  Filled 2015-12-14 (×6): qty 3

## 2015-12-14 MED ORDER — GUAIFENESIN ER 600 MG PO TB12
1200.0000 mg | ORAL_TABLET | Freq: Two times a day (BID) | ORAL | Status: DC
  Administered 2015-12-14 – 2015-12-17 (×6): 1200 mg via ORAL
  Filled 2015-12-14 (×6): qty 2

## 2015-12-14 MED ORDER — ALBUTEROL SULFATE (2.5 MG/3ML) 0.083% IN NEBU: 2.5000 mg | INHALATION_SOLUTION | RESPIRATORY_TRACT | Status: DC | PRN

## 2015-12-14 MED ORDER — CARVEDILOL 3.125 MG PO TABS
ORAL_TABLET | ORAL | Status: AC
  Filled 2015-12-14: qty 3

## 2015-12-14 MED ORDER — CARVEDILOL 3.125 MG PO TABS
9.3750 mg | ORAL_TABLET | Freq: Two times a day (BID) | ORAL | Status: DC
  Administered 2015-12-14 – 2015-12-17 (×6): 9.375 mg via ORAL
  Filled 2015-12-14 (×7): qty 3

## 2015-12-14 MED ORDER — WARFARIN SODIUM 1 MG PO TABS
ORAL_TABLET | ORAL | Status: AC
  Filled 2015-12-14: qty 1

## 2015-12-14 MED ORDER — WARFARIN SODIUM 1 MG PO TABS
3.5000 mg | ORAL_TABLET | Freq: Once | ORAL | Status: AC
  Administered 2015-12-14: 3.5 mg via ORAL

## 2015-12-14 NOTE — Progress Notes (Signed)
PROGRESS NOTE    KAEDIN HORKEY  [id number] DOB: 09/04/1945 DOA: 12/13/2015 PCP: [facility]    Brief Narrative:  27 yom with a hx of PAF, anemia, CKD, COPD, HTN, severe aortic stenosis status-post TAVR, and prostate cancer presented with complaints of dyspnea. Patient was discharged from the hospital on 10/6 after management for acute exacerbation of COPD. He reports his COPD worsened when he got back home so he returned to the hospital. He was noted to be hypoxic and tachycardic. He was admitted for further evaluation of COPD exacerbation with hypoxic respiratory failure.   Assessment & Plan:   Principal Problem:   COPD exacerbation ([place]) Active Problems:   HTN (hypertension)   Paroxysmal atrial flutter ([facility])   Acute respiratory failure with hypoxia ([facility])   Iron deficiency anemia due to chronic blood loss   GERD (gastroesophageal reflux disease)   Acute exacerbation of chronic obstructive pulmonary disease (COPD) ([place])  1. Acute COPD exacerbation. Patient has been started on IV steroids, abx and neb treatments. Continue pulmonary hygiene.  2. Acute Respiratory failure with hypoxia. Secondary to COPD exacerbation. He is on 2L of oxygen. Continue supplemental oxygen, wean as tolerated. 3. Paroxysmal atrial fibrillation. He has a CHADSVASc score of 2. EKG shows sinus rhythm. He is anticoagulated on coumadin. INR is therapeutic. Heart rate is stable 4. Essential HTN. Pressures are stable. Continue coreg.   5.  GERD. Continue PPI.  6. Normocytic anemia. Hgb 11.4, at baseline. No significant signs of bleeding.    DVT prophylaxis: Coumadin  Code Status: Full  Family Communication: No family at bedside Disposition Plan: Discharge home once improved.    Consultants:   None   Procedures:   None   Antimicrobials:   Levaquin 10/7 >>    Subjective: Still feels short of breath. He is coughing. No chest pain  Objective: Vitals:   12/13/15 2230  12/13/15 2303 12/13/15 2317 12/14/15 0406  BP: 124/68  139/74 (!) 129/44  Pulse: 97  99 90  Resp: 15   18  Temp:   97.7 F (36.5 C) 98.9 F (37.2 C)  TempSrc:   Oral Oral  SpO2: 99%  100% 99%  Weight:  109 kg (240 lb 4.8 oz)  108.4 kg (238 lb 14.4 oz)  Height:  5\' 6"  (1.676 m)      Intake/Output Summary (Last 24 hours) at 12/14/15 0703 Last data filed at 12/14/15 0035  Gross per 24 hour  Intake            225.6 ml  Output                0 ml  Net            225.6 ml   Filed Weights   12/13/15 2008 12/13/15 2303 12/14/15 0406  Weight: 106.6 kg (235 lb) 109 kg (240 lb 4.8 oz) 108.4 kg (238 lb 14.4 oz)    Examination:  General exam: Appears calm and comfortable  Respiratory system: diminished breath sounds bilaterally with mild wheeze. Respiratory effort normal. Cardiovascular system: S1 & S2 heard, RRR. No JVD, murmurs, rubs, gallops or clicks. Trace pedal edema. Gastrointestinal system: Abdomen is nondistended, soft and nontender. No organomegaly or masses felt. Normal bowel sounds heard. Central nervous system: Alert and oriented. No focal neurological deficits. Extremities: Symmetric 5 x 5 power. Skin: No rashes, lesions or ulcers Psychiatry: Judgement and insight appear normal. Mood & affect appropriate.     Data Reviewed: I  have personally reviewed following labs and imaging studies  CBC:  Recent Labs Lab 12/11/15 1445 12/12/15 0627 12/13/15 2016  WBC 13.7* 13.4* 14.1*  NEUTROABS  --   --  10.9*  HGB 12.0* 11.3* 11.4*  HCT 38.2* 36.0* 37.1*  MCV 93.2 94.2 94.4  PLT 372 [id number]   Basic Metabolic Panel:  Recent Labs Lab 12/11/15 1445 12/12/15 0627 12/13/15 2016  NA 140 141 140  K 3.9 4.8 4.4  CL 100* 100* 101  CO2 35* 37* 34*  GLUCOSE 162* 115* 142*  BUN 17 15 21*  CREATININE 1.13 0.91 1.16  CALCIUM 9.1 9.1 9.2   GFR: Estimated Creatinine Clearance: 67.4 mL/min (by C-G formula based on SCr of 1.16 mg/dL). Liver Function Tests: No results for  input(s): AST, ALT, ALKPHOS, BILITOT, PROT, ALBUMIN in the last 168 hours. No results for input(s): LIPASE, AMYLASE in the last 168 hours. No results for input(s): AMMONIA in the last 168 hours. Coagulation Profile:  Recent Labs Lab 12/11/15 1445 12/12/15 0627 12/13/15 2019  INR 2.84 2.99 2.91   Cardiac Enzymes: No results for input(s): CKTOTAL, CKMB, CKMBINDEX, TROPONINI in the last 168 hours. BNP (last 3 results) No results for input(s): PROBNP in the last 8760 hours. HbA1C: No results for input(s): HGBA1C in the last 72 hours. CBG: No results for input(s): GLUCAP in the last 168 hours. Lipid Profile: No results for input(s): CHOL, HDL, LDLCALC, TRIG, CHOLHDL, LDLDIRECT in the last 72 hours. Thyroid Function Tests: No results for input(s): TSH, T4TOTAL, FREET4, T3FREE, THYROIDAB in the last 72 hours. Anemia Panel: No results for input(s): VITAMINB12, FOLATE, FERRITIN, TIBC, IRON, RETICCTPCT in the last 72 hours. Sepsis Labs: No results for input(s): PROCALCITON, LATICACIDVEN in the last 168 hours.  No results found for this or any previous visit (from the past 240 hour(s)).       Radiology Studies: Dg Chest Port 1 View  Result Date: 12/13/2015 CLINICAL DATA:  Shortness of breath for 6 months, worse today. Discharge from hospital yesterday because of COPD exacerbation. EXAM: PORTABLE CHEST 1 VIEW COMPARISON:  12/11/2015 FINDINGS: Cardiac enlargement. No vascular congestion or edema. No blunting of costophrenic angles. No pneumothorax. No focal airspace disease or consolidation. Mediastinal contours appear intact. IMPRESSION: Cardiac enlargement.  No evidence of active pulmonary disease. Electronically Signed   By: Lucienne Capers M.D.   On: 12/13/2015 21:06        Scheduled Meds: . aspirin  81 mg Oral Daily  . carvedilol  9.375 mg Oral BID  . colchicine  0.6 mg Oral Daily  . ferrous sulfate  325 mg Oral BID WC  . gabapentin  600 mg Oral TID  . guaiFENesin  600 mg  Oral BID  . levofloxacin (LEVAQUIN) IV  750 mg Intravenous Q24H  . methylPREDNISolone (SOLU-MEDROL) injection  60 mg Intravenous Q6H  . mometasone-formoterol  2 puff Inhalation BID  . pantoprazole  40 mg Oral Daily  . pravastatin  40 mg Oral QHS  . sodium chloride flush  3 mL Intravenous Q12H  . zolpidem  5 mg Oral QHS   Continuous Infusions:    LOS: 1 day    Time spent: 25 minutes    Kathie Dike, MD [facility] If 7PM-7AM, please contact night-coverage [email or web address] Password [email or web address] 12/14/2015, 7:03 AM

## 2015-12-14 NOTE — Progress Notes (Signed)
ANTICOAGULATION CONSULT NOTE - Initial Consult  Pharmacy Consult for Coumadin Indication: atrial fibrillation  Allergies  Allergen Reactions  . Gabapentin Other (See Comments)    Reaction:  GI upset  Daughter states that pt is only allergic to 300mg  capsules.      Patient Measurements: Height: 5\' 6"  (167.6 cm) Weight: 238 lb 14.4 oz (108.4 kg) IBW/kg (Calculated) : 63.8 Heparin Dosing Weight:   Vital Signs: Temp: 98.9 F (37.2 C) (10/08 0406) Temp Source: Oral (10/08 0406) BP: 129/44 (10/08 0406) Pulse Rate: 90 (10/08 0406)  Labs:  Recent Labs  12/11/15 1445 12/12/15 0627 12/13/15 2016 12/13/15 2019 12/14/15 0635 12/14/15 0841  HGB 12.0* 11.3* 11.4*  --   --   --   HCT 38.2* 36.0* 37.1*  --   --   --   PLT 372 346 345  --   --   --   LABPROT 30.5* 31.7*  --  31.0*  --  31.2*  INR 2.84 2.99  --  2.91  --  2.93  CREATININE 1.13 0.91 1.16  --  1.03  --     Estimated Creatinine Clearance: 75.9 mL/min (by C-G formula based on SCr of 1.03 mg/dL).   Medical History: Past Medical History:  Diagnosis Date  . 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 Angelena Form  . Asthma   . Atrial flutter ([place])   . Chronic anemia   . CKD (chronic kidney disease) stage 3, GFR 30-59 ml/min   . COPD (chronic obstructive pulmonary disease) ([place])   . Essential hypertension   . Gout   . Helicobacter pylori gastritis 05/2014  . Lung nodule   . Mitral regurgitation   . Multiple duodenal ulcers 05/2014  . Prostate cancer ([place])   . Thyroid nodule     Medications:  Prescriptions Prior to Admission  Medication Sig Dispense Refill Last Dose  . albuterol (PROVENTIL HFA;VENTOLIN HFA) 108 (90 Base) MCG/ACT inhaler Inhale 1-2 puffs into the lungs every 6 (six) hours as needed for wheezing or shortness of breath.   12/13/2015 at Unknown time  . albuterol (PROVENTIL) (2.5 MG/3ML) 0.083% nebulizer solution Take 3 mLs (2.5 mg total) by nebulization every 4 (four) hours as needed for  wheezing or shortness of breath. 75 mL 12 12/13/2015 at Unknown time  . aspirin 81 MG chewable tablet Chew 1 tablet (81 mg total) by mouth daily. 30 tablet 0 12/12/2015 at 0800  . carvedilol (COREG) 6.25 MG tablet Take 1.5 tablets (9.375 mg total) by mouth 2 (two) times daily. 180 tablet 3 12/13/2015 at 0800  . colchicine 0.6 MG tablet Take 0.6 mg by mouth daily.   12/13/2015 at Unknown time  . doxycycline (VIBRAMYCIN) 100 MG capsule Take 100 mg by mouth 2 (two) times daily.    12/13/2015 at Unknown time  . ferrous sulfate 325 (65 FE) MG tablet Take 325 mg by mouth 2 (two) times daily with a meal.    12/13/2015 at Unknown time  . Fluticasone-Salmeterol (ADVAIR DISKUS) 250-50 MCG/DOSE AEPB Inhale 1 puff into the lungs daily. 60 each 1 12/13/2015 at Unknown time  . furosemide (LASIX) 40 MG tablet Take 40 mg by mouth daily as needed for edema.   12/12/2015 at Unknown time  . gabapentin (NEURONTIN) 600 MG tablet Take 600 mg by mouth 3 (three) times daily.   12/13/2015 at Unknown time  . guaiFENesin (MUCINEX) 600 MG 12 hr tablet Take 1 tablet (600 mg total) by mouth 2 (two)  times daily. 30 tablet 0 12/13/2015 at 0800  . pantoprazole (PROTONIX) 40 MG tablet Take 1 tablet (40 mg total) by mouth daily. 60 tablet 5 12/13/2015 at Unknown time  . pravastatin (PRAVACHOL) 40 MG tablet Take 40 mg by mouth at bedtime.    12/12/2015 at Unknown time  . warfarin (COUMADIN) 1 MG tablet Take 0.5 mg by mouth daily. Pt takes with a 3mg  tablet.   12/13/2015 at 0800  . warfarin (COUMADIN) 3 MG tablet Take 3 mg by mouth daily. Pt takes with one-half of a 1mg  tablet.   12/13/2015 at 0800  . zolpidem (AMBIEN) 10 MG tablet Take 10 mg by mouth at bedtime.   12/12/2015 at Unknown time    Assessment: Continuation of coumadin PTA. Patient taking 3.5 mg po daily as listed on PTA list INR therapeutic last evening on admission and this AM  Goal of Therapy:  INR 2-3 Monitor platelets by anticoagulation protocol: Yes   Plan:  Coumadin 3.5  mg po x 1 dose today (home regiment) INR/PT daily CBC daily  Monitor for signs of bleeding Abner Greenspan, Clotee Schlicker Bennett 12/14/2015,9:48 AM

## 2015-12-15 LAB — PROTIME-INR
INR: 2.45
Prothrombin Time: 27 seconds — ABNORMAL HIGH (ref 11.4–15.2)

## 2015-12-15 LAB — CBC
HEMATOCRIT: 37.6 % — AB (ref 39.0–52.0)
Hemoglobin: 11.9 g/dL — ABNORMAL LOW (ref 13.0–17.0)
MCH: 29.5 pg (ref 26.0–34.0)
MCHC: 31.6 g/dL (ref 30.0–36.0)
MCV: 93.3 fL (ref 78.0–100.0)
PLATELETS: 312 10*3/uL (ref 150–400)
RBC: 4.03 MIL/uL — ABNORMAL LOW (ref 4.22–5.81)
RDW: 14.2 % (ref 11.5–15.5)
WBC: 19 10*3/uL — ABNORMAL HIGH (ref 4.0–10.5)

## 2015-12-15 LAB — GLUCOSE, CAPILLARY
GLUCOSE-CAPILLARY: 115 mg/dL — AB (ref 65–99)
GLUCOSE-CAPILLARY: 154 mg/dL — AB (ref 65–99)

## 2015-12-15 LAB — BRAIN NATRIURETIC PEPTIDE: B NATRIURETIC PEPTIDE 5: 105 pg/mL — AB (ref 0.0–100.0)

## 2015-12-15 MED ORDER — FUROSEMIDE 10 MG/ML IJ SOLN
40.0000 mg | Freq: Once | INTRAMUSCULAR | Status: AC
  Administered 2015-12-15: 40 mg via INTRAVENOUS
  Filled 2015-12-15: qty 4

## 2015-12-15 MED ORDER — WARFARIN SODIUM 5 MG PO TABS
3.5000 mg | ORAL_TABLET | Freq: Once | ORAL | Status: AC
  Administered 2015-12-15: 3.5 mg via ORAL
  Filled 2015-12-15: qty 1

## 2015-12-15 MED ORDER — ENSURE ENLIVE PO LIQD
237.0000 mL | Freq: Two times a day (BID) | ORAL | Status: DC
  Administered 2015-12-16 – 2015-12-17 (×3): 237 mL via ORAL

## 2015-12-15 NOTE — Care Management Important Message (Signed)
Important Message  Patient Details  Name: Ryan Shea MRN: [id number] Date of Birth: 1945/06/10   Medicare Important Message Given:  Yes    Chidera Dearcos, Chauncey Reading, RN 12/15/2015, 12:24 PM

## 2015-12-15 NOTE — Progress Notes (Signed)
PROGRESS NOTE    Ryan Shea  [id number] DOB: May 29, 1945 DOA: 12/13/2015 PCP: [facility]    Brief Narrative:  57 yom with a hx of PAF, anemia, CKD, COPD, HTN, severe aortic stenosis status-post TAVR, and prostate cancer presented with complaints of dyspnea. Patient was discharged from the hospital on 10/6 after management for acute exacerbation of COPD. He reports his COPD worsened when he got back home so he returned to the hospital. He was noted to be hypoxic and tachycardic. He was admitted for further evaluation of COPD exacerbation with hypoxic respiratory failure.   Assessment & Plan:   Principal Problem:   COPD exacerbation ([place]) Active Problems:   HTN (hypertension)   Paroxysmal atrial flutter (H[facility])   Acute respiratory failure with hypoxia (H[facility])   Iron deficiency anemia due to chronic blood loss   GERD (gastroesophageal reflux disease)   Acute exacerbation of chronic obstructive pulmonary disease (COPD) ([place])  1. Acute COPD exacerbation. Patient has been started on IV steroids, abx and neb treatments. Continues to be short of breath and wheezing. Continue pulmonary hygiene. Add flutter valve 2. Acute Respiratory failure with hypoxia. Secondary to COPD exacerbation. He is on 2L of oxygen. Continue supplemental oxygen, wean as tolerated. Check BNP 3. Paroxysmal atrial fibrillation. He has a CHADSVASc score of 2. EKG shows sinus rhythm. He is anticoagulated on coumadin. INR is therapeutic. Heart rate is stable 4. Essential HTN. Pressures are stable. Continue coreg.   5.  GERD. Continue PPI.  6. Normocytic anemia. Hgb 11.4, at baseline. No significant signs of bleeding.    DVT prophylaxis: Coumadin  Code Status: Full  Family Communication: No family at bedside Disposition Plan: Discharge home once improved.    Consultants:   None   Procedures:   None   Antimicrobials:   Levaquin 10/7 >>    Subjective: Having trouble  expectorating sputum. Still feels short of breath and wheezing.  Objective: Vitals:   12/15/15 0216 12/15/15 0500 12/15/15 0642 12/15/15 0748  BP:   132/74   Pulse:   84   Resp:   18   Temp:   97.5 F (36.4 C)   TempSrc:   Oral   SpO2: 98%  94% 98%  Weight:  109.5 kg (241 lb 4.8 oz)    Height:        Intake/Output Summary (Last 24 hours) at 12/15/15 1327 Last data filed at 12/15/15 0330  Gross per 24 hour  Intake              150 ml  Output              850 ml  Net             -700 ml   Filed Weights   12/13/15 2303 12/14/15 0406 12/15/15 0500  Weight: 109 kg (240 lb 4.8 oz) 108.4 kg (238 lb 14.4 oz) 109.5 kg (241 lb 4.8 oz)    Examination:  General exam: Appears calm and comfortable  Respiratory system: diminished breath sounds bilaterally with bilateral wheeze. Respiratory effort normal. Oxygen saturations drop with minimal exertion Cardiovascular system: S1 & S2 heard, RRR. No JVD, murmurs, rubs, gallops or clicks. 1+ pedal edema. Gastrointestinal system: Abdomen is nondistended, soft and nontender. No organomegaly or masses felt. Normal bowel sounds heard. Central nervous system: Alert and oriented. No focal neurological deficits. Extremities: Symmetric 5 x 5 power. Skin: No rashes, lesions or ulcers Psychiatry: Judgement and insight appear normal. Mood &  affect appropriate.     Data Reviewed: I have personally reviewed following labs and imaging studies  CBC:  Recent Labs Lab 12/11/15 1445 12/12/15 0627 12/13/15 2016 12/15/15 0639  WBC 13.7* 13.4* 14.1* 19.0*  NEUTROABS  --   --  10.9*  --   HGB 12.0* 11.3* 11.4* 11.9*  HCT 38.2* 36.0* 37.1* 37.6*  MCV 93.2 94.2 94.4 93.3  PLT 372 346 345 [id number]   Basic Metabolic Panel:  Recent Labs Lab 12/11/15 1445 12/12/15 0627 12/13/15 2016 12/14/15 0635  NA 140 141 140 139  K 3.9 4.8 4.4 5.0  CL 100* 100* 101 99*  CO2 35* 37* 34* 35*  GLUCOSE 162* 115* 142* 142*  BUN 17 15 21* 21*  CREATININE 1.13 0.91  1.16 1.03  CALCIUM 9.1 9.1 9.2 9.1   GFR: Estimated Creatinine Clearance: 76.4 mL/min (by C-G formula based on SCr of 1.03 mg/dL). Liver Function Tests: No results for input(s): AST, ALT, ALKPHOS, BILITOT, PROT, ALBUMIN in the last 168 hours. No results for input(s): LIPASE, AMYLASE in the last 168 hours. No results for input(s): AMMONIA in the last 168 hours. Coagulation Profile:  Recent Labs Lab 12/11/15 1445 12/12/15 0627 12/13/15 2019 12/14/15 0841 12/15/15 0639  INR 2.84 2.99 2.91 2.93 2.45   Cardiac Enzymes: No results for input(s): CKTOTAL, CKMB, CKMBINDEX, TROPONINI in the last 168 hours. BNP (last 3 results) No results for input(s): PROBNP in the last 8760 hours. HbA1C: No results for input(s): HGBA1C in the last 72 hours. CBG:  Recent Labs Lab 12/14/15 0745 12/15/15 0758  GLUCAP 129* 115*   Lipid Profile: No results for input(s): CHOL, HDL, LDLCALC, TRIG, CHOLHDL, LDLDIRECT in the last 72 hours. Thyroid Function Tests: No results for input(s): TSH, T4TOTAL, FREET4, T3FREE, THYROIDAB in the last 72 hours. Anemia Panel: No results for input(s): VITAMINB12, FOLATE, FERRITIN, TIBC, IRON, RETICCTPCT in the last 72 hours. Sepsis Labs: No results for input(s): PROCALCITON, LATICACIDVEN in the last 168 hours.  Recent Results (from the past 240 hour(s))  Culture, respiratory (NON-Expectorated)     Status: None (Preliminary result)   Collection Time: 12/14/15  3:30 PM  Result Value Ref Range Status   Specimen Description SPUTUM  Final   Special Requests NONE  Final   Gram Stain   Final    FEW WBC PRESENT, PREDOMINANTLY MONONUCLEAR FEW SQUAMOUS EPITHELIAL CELLS PRESENT ABUNDANT GRAM POSITIVE COCCI IN PAIRS IN CLUSTERS FEW GRAM POSITIVE RODS RARE GRAM NEGATIVE RODS RARE BUDDING YEAST SEEN    Culture   Final    CULTURE REINCUBATED FOR BETTER GROWTH Performed at [facility]    Report Status PENDING  Incomplete         Radiology Studies: Dg  Chest Port 1 View  Result Date: 12/13/2015 CLINICAL DATA:  Shortness of breath for 6 months, worse today. Discharge from hospital yesterday because of COPD exacerbation. EXAM: PORTABLE CHEST 1 VIEW COMPARISON:  12/11/2015 FINDINGS: Cardiac enlargement. No vascular congestion or edema. No blunting of costophrenic angles. No pneumothorax. No focal airspace disease or consolidation. Mediastinal contours appear intact. IMPRESSION: Cardiac enlargement.  No evidence of active pulmonary disease. Electronically Signed   By: Lucienne Capers M.D.   On: 12/13/2015 21:06        Scheduled Meds: . aspirin  81 mg Oral Daily  . carvedilol  9.375 mg Oral BID WC  . colchicine  0.6 mg Oral Daily  . ferrous sulfate  325 mg Oral BID WC  . gabapentin  600 mg  Oral TID  . guaiFENesin  1,200 mg Oral BID  . ipratropium-albuterol  3 mL Nebulization Q6H  . levofloxacin (LEVAQUIN) IV  750 mg Intravenous Q24H  . methylPREDNISolone (SOLU-MEDROL) injection  60 mg Intravenous Q6H  . mometasone-formoterol  2 puff Inhalation BID  . pantoprazole  40 mg Oral Daily  . pravastatin  40 mg Oral QHS  . sodium chloride flush  3 mL Intravenous Q12H  . warfarin  3.5 mg Oral Once  . Warfarin - Pharmacist Dosing Inpatient   Does not apply Q24H  . zolpidem  5 mg Oral QHS   Continuous Infusions:    LOS: 2 days    Time spent: 25 minutes    Kathie Dike, MD [facility] If 7PM-7AM, please contact night-coverage [email or web address] Password [email or web address] 12/15/2015, 1:27 PM

## 2015-12-15 NOTE — Care Management Important Message (Signed)
Important Message  Patient Details  Name: Ryan Shea MRN: [id number] Date of Birth: 05/31/1945   Medicare Important Message Given:  Yes    Issachar Broady, Chauncey Reading, RN 12/15/2015, 12:24 PM

## 2015-12-15 NOTE — Progress Notes (Signed)
ANTICOAGULATION CONSULT NOTE - follow up  Pharmacy Consult for Coumadin Indication: atrial fibrillation  Allergies  Allergen Reactions  . Gabapentin Other (See Comments)    Reaction:  GI upset  Daughter states that pt is only allergic to 300mg  capsules.     Patient Measurements: Height: 5\' 6"  (167.6 cm) Weight: 241 lb 4.8 oz (109.5 kg) IBW/kg (Calculated) : 63.8 Heparin Dosing Weight:   Vital Signs: Temp: 97.5 F (36.4 C) (10/09 0642) Temp Source: Oral (10/09 0642) BP: 132/74 (10/09 [id number]) Pulse Rate: 84 (10/09 0642)  Labs:  Recent Labs  12/13/15 2016 12/13/15 2019 12/14/15 0635 12/14/15 0841 12/15/15 0639  HGB 11.4*  --   --   --  11.9*  HCT 37.1*  --   --   --  37.6*  PLT 345  --   --   --  312  LABPROT  --  31.0*  --  31.2* 27.0*  INR  --  2.91  --  2.93 2.45  CREATININE 1.16  --  1.03  --   --     Estimated Creatinine Clearance: 76.4 mL/min (by C-G formula based on SCr of 1.03 mg/dL).   Medical History: Past Medical History:  Diagnosis Date  . 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 Angelena Form  . Asthma   . Atrial flutter ([place])   . Chronic anemia   . CKD (chronic kidney disease) stage 3, GFR 30-59 ml/min   . COPD (chronic obstructive pulmonary disease) ([place])   . Essential hypertension   . Gout   . Helicobacter pylori gastritis 05/2014  . Lung nodule   . Mitral regurgitation   . Multiple duodenal ulcers 05/2014  . Prostate cancer ([place])   . Thyroid nodule     Medications:  Prescriptions Prior to Admission  Medication Sig Dispense Refill Last Dose  . albuterol (PROVENTIL HFA;VENTOLIN HFA) 108 (90 Base) MCG/ACT inhaler Inhale 1-2 puffs into the lungs every 6 (six) hours as needed for wheezing or shortness of breath.   12/13/2015 at Unknown time  . albuterol (PROVENTIL) (2.5 MG/3ML) 0.083% nebulizer solution Take 3 mLs (2.5 mg total) by nebulization every 4 (four) hours as needed for wheezing or shortness of breath. 75 mL 12 12/13/2015 at  Unknown time  . aspirin 81 MG chewable tablet Chew 1 tablet (81 mg total) by mouth daily. 30 tablet 0 12/12/2015 at 0800  . carvedilol (COREG) 6.25 MG tablet Take 1.5 tablets (9.375 mg total) by mouth 2 (two) times daily. 180 tablet 3 12/13/2015 at 0800  . colchicine 0.6 MG tablet Take 0.6 mg by mouth daily.   12/13/2015 at Unknown time  . doxycycline (VIBRAMYCIN) 100 MG capsule Take 100 mg by mouth 2 (two) times daily.    12/13/2015 at Unknown time  . ferrous sulfate 325 (65 FE) MG tablet Take 325 mg by mouth 2 (two) times daily with a meal.    12/13/2015 at Unknown time  . Fluticasone-Salmeterol (ADVAIR DISKUS) 250-50 MCG/DOSE AEPB Inhale 1 puff into the lungs daily. 60 each 1 12/13/2015 at Unknown time  . furosemide (LASIX) 40 MG tablet Take 40 mg by mouth daily as needed for edema.   12/12/2015 at Unknown time  . gabapentin (NEURONTIN) 600 MG tablet Take 600 mg by mouth 3 (three) times daily.   12/13/2015 at Unknown time  . guaiFENesin (MUCINEX) 600 MG 12 hr tablet Take 1 tablet (600 mg total) by mouth 2 (two) times daily. 30 tablet 0 12/13/2015 at  0800  . pantoprazole (PROTONIX) 40 MG tablet Take 1 tablet (40 mg total) by mouth daily. 60 tablet 5 12/13/2015 at Unknown time  . pravastatin (PRAVACHOL) 40 MG tablet Take 40 mg by mouth at bedtime.    12/12/2015 at Unknown time  . warfarin (COUMADIN) 1 MG tablet Take 0.5 mg by mouth daily. Pt takes with a 3mg  tablet.   12/13/2015 at 0800  . warfarin (COUMADIN) 3 MG tablet Take 3 mg by mouth daily. Pt takes with one-half of a 1mg  tablet.   12/13/2015 at 0800  . zolpidem (AMBIEN) 10 MG tablet Take 10 mg by mouth at bedtime.   12/12/2015 at Unknown time   Assessment: Continuation of coumadin PTA. Patient taking 3.5 mg po daily as listed on PTA list INR therapeutic.  Goal of Therapy:  INR 2-3 Monitor platelets by anticoagulation protocol: Yes   Plan:  Coumadin 3.5 mg po x 1 dose today (home regimen) INR/PT daily CBC daily  Monitor for signs of  bleeding  Hart Robinsons A 12/15/2015,12:08 PM

## 2015-12-15 NOTE — Clinical Social Work Note (Signed)
CSW received referral for COPD gold protocol. Pt just assessed last week for this. Will sign off, but can be reconsulted if needed.  Ryan Shea, [place]

## 2015-12-15 NOTE — Progress Notes (Signed)
Initial Nutrition Assessment  DOCUMENTATION CODES:  Obesity class II  INTERVENTION:  Heart healthy meals  Ensure Enlive po BID, due to poor meal intake. Each supplement provides 350 kcal and 20 grams of protein   NUTRITION DIAGNOSIS:   Inadequate oral intake related to acute illness, chronic illness (COPD) as evidenced by   25-50% meals consumed.  GOAL:   Patient will meet greater than or equal to 90% of their needs  MONITOR:  Po intake, labs and wt trends     REASON FOR ASSESSMENT:   Consult Assessment of nutrition requirement/status  ASSESSMENT:  Mr Hebel has been admitted 5 times in the past 6 months. He has hx of COPD, CKD-3, HTN, prostate cancer, GERD.   He lives with his wife who shops for and prepares their meals. He reports good appetite and denies changes in usual intake. He eats regularly 3 meals daily and follows a no added salt diet. Coffee is his main beverage and he limits fried foods. Mr [place] says he also has been working on decreasing his portions. Gradual supervised weight loss would be desirable for him. He ambulates without a walker or cane at home. His weight hx is stable currently.  Nutrition-Focused physical exam completed. Findings are no fat or muscle depletion, and no edema.    Recent Labs Lab 12/12/15 0627 12/13/15 2016 12/14/15 0635  NA 141 140 139  K 4.8 4.4 5.0  CL 100* 101 99*  CO2 37* 34* 35*  BUN 15 21* 21*  CREATININE 0.91 1.16 1.03  CALCIUM 9.1 9.2 9.1  GLUCOSE 115* 142* 142*    Labs: BUN 21, Glucose 142  Meds/vitamins: ferrous sulfate, prednisone, coumadin  Diet Order:  Diet Heart Room service appropriate? Yes; Fluid consistency: Thin  Skin:  Reviewed, no issues  Last BM:  10/8   Height:   Ht Readings from Last 1 Encounters:  12/13/15 5\' 6"  (1.676 m)    Weight:   Wt Readings from Last 1 Encounters:  12/15/15 241 lb 4.8 oz (109.5 kg)    Ideal Body Weight:  65 kg  BMI:  Body mass index is 38.95 kg/m.  Estimated  Nutritional Needs:   Kcal:   2100-2300   Protein:  60-70 gr  Fluid:  2.0 liters daily  EDUCATION NEEDS: addressed related to low sodium diet  Colman Cater MS,RD,CSG,LDN Office: [phone] Pager: [phone]

## 2015-12-16 DIAGNOSIS — D72829 Elevated white blood cell count, unspecified: Secondary | ICD-10-CM

## 2015-12-16 LAB — CBC
HEMATOCRIT: 37.2 % — AB (ref 39.0–52.0)
HEMOGLOBIN: 11.6 g/dL — AB (ref 13.0–17.0)
MCH: 28.9 pg (ref 26.0–34.0)
MCHC: 31.2 g/dL (ref 30.0–36.0)
MCV: 92.5 fL (ref 78.0–100.0)
Platelets: 339 10*3/uL (ref 150–400)
RBC: 4.02 MIL/uL — ABNORMAL LOW (ref 4.22–5.81)
RDW: 14.1 % (ref 11.5–15.5)
WBC: 19.6 10*3/uL — AB (ref 4.0–10.5)

## 2015-12-16 LAB — BASIC METABOLIC PANEL
ANION GAP: 6 (ref 5–15)
BUN: 32 mg/dL — ABNORMAL HIGH (ref 6–20)
CHLORIDE: 94 mmol/L — AB (ref 101–111)
CO2: 35 mmol/L — AB (ref 22–32)
Calcium: 8.7 mg/dL — ABNORMAL LOW (ref 8.9–10.3)
Creatinine, Ser: 1.02 mg/dL (ref 0.61–1.24)
GFR calc non Af Amer: >60 mL/min (ref >60)
GLUCOSE: 119 mg/dL — AB (ref 65–99)
Potassium: 4.6 mmol/L (ref 3.5–5.1)
Sodium: 135 mmol/L (ref 135–145)

## 2015-12-16 LAB — GLUCOSE, CAPILLARY
GLUCOSE-CAPILLARY: 153 mg/dL — AB (ref 65–99)
Glucose-Capillary: 115 mg/dL — ABNORMAL HIGH (ref 65–99)

## 2015-12-16 LAB — PROTIME-INR
INR: 2.47
Prothrombin Time: 27.2 seconds — ABNORMAL HIGH (ref 11.4–15.2)

## 2015-12-16 MED ORDER — IPRATROPIUM-ALBUTEROL 0.5-2.5 (3) MG/3ML IN SOLN
3.0000 mL | Freq: Three times a day (TID) | RESPIRATORY_TRACT | Status: DC
  Administered 2015-12-16 – 2015-12-17 (×5): 3 mL via RESPIRATORY_TRACT
  Filled 2015-12-16 (×5): qty 3

## 2015-12-16 MED ORDER — WARFARIN SODIUM 5 MG PO TABS
3.5000 mg | ORAL_TABLET | Freq: Once | ORAL | Status: AC
  Administered 2015-12-16: 3.5 mg via ORAL
  Filled 2015-12-16: qty 1

## 2015-12-16 MED ORDER — METHYLPREDNISOLONE SODIUM SUCC 125 MG IJ SOLR
60.0000 mg | Freq: Two times a day (BID) | INTRAMUSCULAR | Status: DC
  Filled 2015-12-16: qty 2

## 2015-12-16 NOTE — Evaluation (Signed)
Occupational Therapy Evaluation Patient Details Name: Ryan Shea MRN: [id number] DOB: 1945-05-26 Today's Date: 12/16/2015    History of Present Illness  Ryan Shea is a 70 y.o. male with medical history significant for COPD, paroxysmal atrial fibrillation, peptic ulcer disease with chronic iron deficiency anemia, severe aortic stenosis status-post TAVR, and hypertension who presents to the emergency department for evaluation of dyspnea. Patient was just discharged from the hospital yesterday after management for acute exacerbation and COPD. He was discharged home with doxycycline and a prednisone taper, and while he reports making good improvement in the hospital and feeling well at time of discharge, states that his condition quickly re-worsened back at home. He was unable to qualify for oxygen in the hospital, and reports that a medical supply company was to set him up for an overnight study at home to see if he can qualify. Unfortunately, despite use of his nebulized breathing treatments at home, patient was significantly dyspneic with minimal exertion. He denies recent fevers, chills, or cough. He also denies chest pain or palpitations, denies any increase in his mild chronic lower extremity edema, and denies orthopnea.   Clinical Impression   Pt awake, alert, oriented x4 this am, agreeable to OT evaluation. Pt reports he is feeling improved this am with O2. Pt is at baseline independence with ADL and functional mobility tasks, no further OT services required at this time. Pt is interested in tub/shower seat to conserve energy and reduce fatigue during bathing tasks.     Follow Up Recommendations  No OT follow up    Equipment Recommendations  Tub/shower seat       Precautions / Restrictions Precautions Precautions: None Restrictions Weight Bearing Restrictions: No      Mobility Bed Mobility Overal bed mobility: Independent                Transfers Overall  transfer level: Independent Equipment used: None                       ADL Overall ADL's : Modified independent;At baseline                                       General ADL Comments: Pt requries rest breaks due to SOB     Vision Vision Assessment?: No apparent visual deficits          Pertinent Vitals/Pain Pain Assessment: No/denies pain     Hand Dominance Right   Extremity/Trunk Assessment Upper Extremity Assessment Upper Extremity Assessment: Overall WFL for tasks assessed   Lower Extremity Assessment Lower Extremity Assessment: Defer to PT evaluation       Communication Communication Communication: No difficulties   Cognition Arousal/Alertness: Awake/alert Behavior During Therapy: WFL for tasks assessed/performed Overall Cognitive Status: Within Functional Limits for tasks assessed                                Home Living Family/patient expects to be discharged to:: Private residence Living Arrangements: Spouse/significant other Available Help at Discharge: Family;Available PRN/intermittently Type of Home: Apartment Home Access: Level entry     Home Layout: One level     Bathroom Shower/Tub: Teacher, early years/pre: Handicapped height     Home Equipment: Other (comment) (walking stick)          Prior  Functioning/Environment Level of Independence: Independent  Gait / Transfers Assistance Needed: Pt does use walking stick outside ADL's / Homemaking Assistance Needed: Pt reports independence in ADL and IADL tasks, drives             End of Session    Activity Tolerance: Patient tolerated treatment well Patient left: in bed;with call bell/phone within reach   Time: 0855-0906 OT Time Calculation (min): 11 min Charges:  OT General Charges $OT Visit: 1 Procedure OT Evaluation $OT Eval Low Complexity: 1 Procedure Ryan Shea, OTR/L  [phone] 12/16/2015, 9:23 AM

## 2015-12-16 NOTE — Progress Notes (Signed)
ANTICOAGULATION CONSULT NOTE - follow up  Pharmacy Consult for Coumadin Indication: atrial fibrillation  Allergies  Allergen Reactions  . Gabapentin Other (See Comments)    Reaction:  GI upset  Daughter states that pt is only allergic to 300mg  capsules.     Patient Measurements: Height: 5\' 6"  (167.6 cm) Weight: 241 lb 6.5 oz (109.5 kg) IBW/kg (Calculated) : 63.8  Vital Signs: Temp: 96.9 F (36.1 C) (10/10 0631) Temp Source: Oral (10/10 0631) BP: 145/67 (10/10 0631) Pulse Rate: 102 (10/10 0932)  Labs:  Recent Labs  12/13/15 2016  12/14/15 0635 12/14/15 0841 12/15/15 0639 12/16/15 0610  HGB 11.4*  --   --   --  11.9* 11.6*  HCT 37.1*  --   --   --  37.6* 37.2*  PLT 345  --   --   --  312 339  LABPROT  --   < >  --  31.2* 27.0* 27.2*  INR  --   < >  --  2.93 2.45 2.47  CREATININE 1.16  --  1.03  --   --  1.02  < > = values in this interval not displayed.  Estimated Creatinine Clearance: 77.1 mL/min (by C-G formula based on SCr of 1.02 mg/dL).  Medical History: Past Medical History:  Diagnosis Date  . 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 Angelena Form  . Asthma   . Atrial flutter ([place])   . Chronic anemia   . CKD (chronic kidney disease) stage 3, GFR 30-59 ml/min   . COPD (chronic obstructive pulmonary disease) ([place])   . Essential hypertension   . Gout   . Helicobacter pylori gastritis 05/2014  . Lung nodule   . Mitral regurgitation   . Multiple duodenal ulcers 05/2014  . Prostate cancer ([place])   . Thyroid nodule     Medications:  Prescriptions Prior to Admission  Medication Sig Dispense Refill Last Dose  . albuterol (PROVENTIL HFA;VENTOLIN HFA) 108 (90 Base) MCG/ACT inhaler Inhale 1-2 puffs into the lungs every 6 (six) hours as needed for wheezing or shortness of breath.   12/13/2015 at Unknown time  . albuterol (PROVENTIL) (2.5 MG/3ML) 0.083% nebulizer solution Take 3 mLs (2.5 mg total) by nebulization every 4 (four) hours as needed for wheezing  or shortness of breath. 75 mL 12 12/13/2015 at Unknown time  . aspirin 81 MG chewable tablet Chew 1 tablet (81 mg total) by mouth daily. 30 tablet 0 12/12/2015 at 0800  . carvedilol (COREG) 6.25 MG tablet Take 1.5 tablets (9.375 mg total) by mouth 2 (two) times daily. 180 tablet 3 12/13/2015 at 0800  . colchicine 0.6 MG tablet Take 0.6 mg by mouth daily.   12/13/2015 at Unknown time  . doxycycline (VIBRAMYCIN) 100 MG capsule Take 100 mg by mouth 2 (two) times daily.    12/13/2015 at Unknown time  . ferrous sulfate 325 (65 FE) MG tablet Take 325 mg by mouth 2 (two) times daily with a meal.    12/13/2015 at Unknown time  . Fluticasone-Salmeterol (ADVAIR DISKUS) 250-50 MCG/DOSE AEPB Inhale 1 puff into the lungs daily. 60 each 1 12/13/2015 at Unknown time  . furosemide (LASIX) 40 MG tablet Take 40 mg by mouth daily as needed for edema.   12/12/2015 at Unknown time  . gabapentin (NEURONTIN) 600 MG tablet Take 600 mg by mouth 3 (three) times daily.   12/13/2015 at Unknown time  . guaiFENesin (MUCINEX) 600 MG 12 hr tablet Take 1 tablet (  600 mg total) by mouth 2 (two) times daily. 30 tablet 0 12/13/2015 at 0800  . pantoprazole (PROTONIX) 40 MG tablet Take 1 tablet (40 mg total) by mouth daily. 60 tablet 5 12/13/2015 at Unknown time  . pravastatin (PRAVACHOL) 40 MG tablet Take 40 mg by mouth at bedtime.    12/12/2015 at Unknown time  . warfarin (COUMADIN) 1 MG tablet Take 0.5 mg by mouth daily. Pt takes with a 3mg  tablet.   12/13/2015 at 0800  . warfarin (COUMADIN) 3 MG tablet Take 3 mg by mouth daily. Pt takes with one-half of a 1mg  tablet.   12/13/2015 at 0800  . zolpidem (AMBIEN) 10 MG tablet Take 10 mg by mouth at bedtime.   12/12/2015 at Unknown time   Assessment: Continuation of coumadin PTA. Patient taking 3.5 mg po daily as listed on PTA list INR therapeutic.  Goal of Therapy:  INR 2-3 Monitor platelets by anticoagulation protocol: Yes   Plan:  Coumadin 3.5 mg po x 1 dose today (home regimen) INR/PT  daily CBC daily  Monitor for signs of bleeding  Hart Robinsons A 12/16/2015,10:59 AM

## 2015-12-16 NOTE — Progress Notes (Signed)
PROGRESS NOTE    Ryan Shea  [id number] DOB: Jul 22, 1945 DOA: 12/13/2015 PCP: [facility]    Brief Narrative:  43 yom with a hx of PAF, anemia, CKD, COPD, HTN, severe aortic stenosis status-post TAVR, and prostate cancer presented with complaints of dyspnea. Patient was discharged from the hospital on 10/6 after management for acute exacerbation of COPD. He reports his COPD worsened when he got back home so he returned to the hospital. He was noted to be hypoxic and tachycardic. He was admitted for further evaluation of COPD exacerbation with hypoxic respiratory failure.   Assessment & Plan:   Principal Problem:   COPD exacerbation ([place]) Active Problems:   HTN (hypertension)   Paroxysmal atrial flutter ([facility])   Acute respiratory failure with hypoxia ([facility])   Leukocytosis   Iron deficiency anemia due to chronic blood loss   GERD (gastroesophageal reflux disease)   Acute exacerbation of chronic obstructive pulmonary disease (COPD) ([place])  1. Acute COPD exacerbation. Patient has been started on IV steroids, abx and neb treatments. Chest exam is better today with decreased wheezing. We'll start to taper steroids. Continue neb treatments, pulmonary hygiene, antibiotics. 2. Acute Respiratory failure with hypoxia. Secondary to COPD exacerbation. He is on 2L of oxygen. May need to discharge home with oxygen 3. Paroxysmal atrial fibrillation. He has a CHADSVASc score of 2. EKG shows sinus rhythm. He is anticoagulated on coumadin. INR is therapeutic. Heart rate is stable 4. Essential HTN. Pressures are stable. Continue coreg.   5.  GERD. Continue PPI.  6. Normocytic anemia. Hgb 11.4, at baseline. No significant signs of bleeding.    DVT prophylaxis: Coumadin  Code Status: Full  Family Communication: No family at bedside Disposition Plan: Discharge home once improved.    Consultants:   None   Procedures:   None   Antimicrobials:   Levaquin 10/7 >>     Subjective: Breathing improving. Feels that secretions are breaking up, but still having difficulty expectorating them  Objective: Vitals:   12/16/15 1245 12/16/15 1246 12/16/15 1259 12/16/15 1454  BP:   (!) 143/71   Pulse:   (!) 109   Resp:   20   Temp:   98 F (36.7 C)   TempSrc:   Oral   SpO2: (!) 81% 94% 96% 94%  Weight:      Height:        Intake/Output Summary (Last 24 hours) at 12/16/15 1743 Last data filed at 12/16/15 1200  Gross per 24 hour  Intake              873 ml  Output             1800 ml  Net             -927 ml   Filed Weights   12/14/15 0406 12/15/15 0500 12/16/15 0631  Weight: 108.4 kg (238 lb 14.4 oz) 109.5 kg (241 lb 4.8 oz) 109.5 kg (241 lb 6.5 oz)    Examination:  General exam: Appears calm and comfortable  Respiratory system: clear bilaterally. Respiratory effort normal. Cardiovascular system: S1 & S2 heard, RRR. No JVD, murmurs, rubs, gallops or clicks. No pedal edema. Gastrointestinal system: Abdomen is nondistended, soft and nontender. No organomegaly or masses felt. Normal bowel sounds heard. Central nervous system: Alert and oriented. No focal neurological deficits. Extremities: Symmetric 5 x 5 power. Skin: No rashes, lesions or ulcers Psychiatry: Judgement and insight appear normal. Mood & affect appropriate.  Data Reviewed: I have personally reviewed following labs and imaging studies  CBC:  Recent Labs Lab 12/11/15 1445 12/12/15 0627 12/13/15 2016 12/15/15 0639 12/16/15 0610  WBC 13.7* 13.4* 14.1* 19.0* 19.6*  NEUTROABS  --   --  10.9*  --   --   HGB 12.0* 11.3* 11.4* 11.9* 11.6*  HCT 38.2* 36.0* 37.1* 37.6* 37.2*  MCV 93.2 94.2 94.4 93.3 92.5  PLT 372 346 345 312 [id number]   Basic Metabolic Panel:  Recent Labs Lab 12/11/15 1445 12/12/15 0627 12/13/15 2016 12/14/15 0635 12/16/15 0610  NA 140 141 140 139 135  K 3.9 4.8 4.4 5.0 4.6  CL 100* 100* 101 99* 94*  CO2 35* 37* 34* 35* 35*  GLUCOSE 162* 115* 142* 142*  119*  BUN 17 15 21* 21* 32*  CREATININE 1.13 0.91 1.16 1.03 1.02  CALCIUM 9.1 9.1 9.2 9.1 8.7*   GFR: Estimated Creatinine Clearance: 77.1 mL/min (by C-G formula based on SCr of 1.02 mg/dL). Liver Function Tests: No results for input(s): AST, ALT, ALKPHOS, BILITOT, PROT, ALBUMIN in the last 168 hours. No results for input(s): LIPASE, AMYLASE in the last 168 hours. No results for input(s): AMMONIA in the last 168 hours. Coagulation Profile:  Recent Labs Lab 12/12/15 0627 12/13/15 2019 12/14/15 0841 12/15/15 0639 12/16/15 0610  INR 2.99 2.91 2.93 2.45 2.47   Cardiac Enzymes: No results for input(s): CKTOTAL, CKMB, CKMBINDEX, TROPONINI in the last 168 hours. BNP (last 3 results) No results for input(s): PROBNP in the last 8760 hours. HbA1C: No results for input(s): HGBA1C in the last 72 hours. CBG:  Recent Labs Lab 12/14/15 0745 12/15/15 0758 12/15/15 2201 12/16/15 0723  GLUCAP 129* 115* 154* 115*   Lipid Profile: No results for input(s): CHOL, HDL, LDLCALC, TRIG, CHOLHDL, LDLDIRECT in the last 72 hours. Thyroid Function Tests: No results for input(s): TSH, T4TOTAL, FREET4, T3FREE, THYROIDAB in the last 72 hours. Anemia Panel: No results for input(s): VITAMINB12, FOLATE, FERRITIN, TIBC, IRON, RETICCTPCT in the last 72 hours. Sepsis Labs: No results for input(s): PROCALCITON, LATICACIDVEN in the last 168 hours.  Recent Results (from the past 240 hour(s))  Culture, respiratory (NON-Expectorated)     Status: None (Preliminary result)   Collection Time: 12/14/15  3:30 PM  Result Value Ref Range Status   Specimen Description SPUTUM  Final   Special Requests NONE  Final   Gram Stain   Final    FEW WBC PRESENT, PREDOMINANTLY MONONUCLEAR FEW SQUAMOUS EPITHELIAL CELLS PRESENT ABUNDANT GRAM POSITIVE COCCI IN PAIRS IN CLUSTERS FEW GRAM POSITIVE RODS RARE GRAM NEGATIVE RODS RARE BUDDING YEAST SEEN    Culture   Final    CULTURE REINCUBATED FOR BETTER GROWTH Performed at  [facility]    Report Status PENDING  Incomplete         Radiology Studies: No results found.      Scheduled Meds: . aspirin  81 mg Oral Daily  . carvedilol  9.375 mg Oral BID WC  . colchicine  0.6 mg Oral Daily  . feeding supplement (ENSURE ENLIVE)  237 mL Oral BID BM  . ferrous sulfate  325 mg Oral BID WC  . gabapentin  600 mg Oral TID  . guaiFENesin  1,200 mg Oral BID  . ipratropium-albuterol  3 mL Nebulization TID  . levofloxacin (LEVAQUIN) IV  750 mg Intravenous Q24H  . [START ON 12/17/2015] methylPREDNISolone (SOLU-MEDROL) injection  60 mg Intravenous Q12H  . mometasone-formoterol  2 puff Inhalation BID  . pantoprazole  40 mg Oral Daily  . pravastatin  40 mg Oral QHS  . sodium chloride flush  3 mL Intravenous Q12H  . warfarin  3.5 mg Oral Once  . Warfarin - Pharmacist Dosing Inpatient   Does not apply Q24H  . zolpidem  5 mg Oral QHS   Continuous Infusions:    LOS: 3 days    Time spent: 25 minutes    Kathie Dike, MD [facility] If 7PM-7AM, please contact night-coverage [email or web address] Password [email or web address] 12/16/2015, 5:43 PM

## 2015-12-16 NOTE — Evaluation (Signed)
Physical Therapy Evaluation Patient Details Name: JIMY BOUGHTER MRN: [id number] DOB: Mar 05, 1946 Today's Date: 12/16/2015   History of Present Illness   PARAG BASOM is a 70 y.o. male with medical history significant for COPD, paroxysmal atrial fibrillation, peptic ulcer disease with chronic iron deficiency anemia, severe aortic stenosis status-post TAVR, and hypertension who presents to the emergency department for evaluation of dyspnea. Patient was just discharged from the hospital yesterday after management for acute exacerbation and COPD. He was discharged home with doxycycline and a prednisone taper, and while he reports making good improvement in the hospital and feeling well at time of discharge, states that his condition quickly re-worsened back at home. He was unable to qualify for oxygen in the hospital, and reports that a medical supply company was to set him up for an overnight study at home to see if he can qualify. Unfortunately, despite use of his nebulized breathing treatments at home, patient was significantly dyspneic with minimal exertion. He denies recent fevers, chills, or cough. He also denies chest pain or palpitations, denies any increase in his mild chronic lower extremity edema, and denies orthopnea.  Clinical Impression  Pt received in bed, and was agreeable to PT evaluation.  Pt lives with his wife, and is independent with ambulation and ADL's.  He uses a walking stick when ambulating outside, and is an Industrial/product designer.  During PT evaluation today, he ambulated 348ft independently.  He does not demonstrate any acute skilled PT needs as he performed all functional mobility independently.  At this point, will sign off.  No follow-up recommended.      Follow Up Recommendations No PT follow up    Equipment Recommendations  None recommended by PT    Recommendations for Other Services       Precautions / Restrictions Precautions Precautions:  None Restrictions Weight Bearing Restrictions: No      Mobility  Bed Mobility Overal bed mobility: Independent                Transfers Overall transfer level: Independent Equipment used: None                Ambulation/Gait Ambulation/Gait assistance: Independent Ambulation Distance (Feet): 300 Feet Assistive device: None Gait Pattern/deviations: WFL(Within Functional Limits)   Gait velocity interpretation: at or above normal speed for age/gender    Stairs            Wheelchair Mobility    Modified Rankin (Stroke Patients Only)       Balance Overall balance assessment: No apparent balance deficits (not formally assessed)                                           Pertinent Vitals/Pain Pain Assessment: No/denies pain    Home Living Family/patient expects to be discharged to:: Private residence Living Arrangements: Spouse/significant other Available Help at Discharge: Family;Available PRN/intermittently Type of Home: Apartment Home Access: Level entry     Home Layout: One level Home Equipment: Other (comment) (walking stick, pt sleeps in a sleep number bed with the HOB raised slightly. )      Prior Function Level of Independence: Independent   Gait / Transfers Assistance Needed: Pt does use walking stick outside  ADL's / Homemaking Assistance Needed: Pt reports independence in ADL and IADL tasks, drives        Hand Dominance   Dominant  Hand: Right    Extremity/Trunk Assessment   Upper Extremity Assessment: Overall WFL for tasks assessed           Lower Extremity Assessment: Overall WFL for tasks assessed         Communication   Communication: No difficulties  Cognition Arousal/Alertness: Awake/alert Behavior During Therapy: WFL for tasks assessed/performed Overall Cognitive Status: Within Functional Limits for tasks assessed                      General Comments      Exercises      Assessment/Plan    PT Assessment Patent does not need any further PT services  PT Problem List Cardiopulmonary status limiting activity          PT Treatment Interventions      PT Goals (Current goals can be found in the Care Plan section)  Acute Rehab PT Goals PT Goal Formulation: All assessment and education complete, DC therapy    Frequency     Barriers to discharge        Co-evaluation               End of Session Equipment Utilized During Treatment: Gait belt;Oxygen Activity Tolerance: Patient tolerated treatment well Patient left: in chair;with call bell/phone within reach Nurse Communication: Mobility status (Val, RN notified of pt's location and mobility status, as well as needing new SpO2 monitor.  )    Functional Assessment Tool Used: [place] "6-clicks"  Functional Limitation: Mobility: Walking and moving around Mobility: Walking and Moving Around Current Status [phone]): 0 percent impaired, limited or restricted Mobility: Walking and Moving Around Goal Status [phone]): 0 percent impaired, limited or restricted Mobility: Walking and Moving Around Discharge Status [phone]): 0 percent impaired, limited or restricted    Time: 0914-0930 PT Time Calculation (min) (ACUTE ONLY): 16 min   Charges:   PT Evaluation $PT Eval Low Complexity: 1 Procedure     PT G Codes:   PT G-Codes **NOT FOR INPATIENT CLASS** Functional Assessment Tool Used: [employer] "6-clicks"  Functional Limitation: Mobility: Walking and moving around Mobility: Walking and Moving Around Current Status [phone]): 0 percent impaired, limited or restricted Mobility: Walking and Moving Around Goal Status [phone]): 0 percent impaired, limited or restricted Mobility: Walking and Moving Around Discharge Status [phone]): 0 percent impaired, limited or restricted    Beth Bond Grieshop, PT, DPT X: [phone]

## 2015-12-16 NOTE — Progress Notes (Signed)
SATURATION QUALIFICATIONS: (This note is used to comply with regulatory documentation for home oxygen)  Patient Saturations on Room Air at Rest = 85%  Patient Saturations on Room Air while Ambulating = 86%  Patient Saturations on 2 Liters of oxygen while Ambulating =92%  Please briefly explain why patient needs home oxygen:

## 2015-12-17 DIAGNOSIS — I4892 Unspecified atrial flutter: Secondary | ICD-10-CM

## 2015-12-17 DIAGNOSIS — I10 Essential (primary) hypertension: Secondary | ICD-10-CM

## 2015-12-17 DIAGNOSIS — J441 Chronic obstructive pulmonary disease with (acute) exacerbation: Secondary | ICD-10-CM

## 2015-12-17 LAB — CBC
HCT: 39.2 % (ref 39.0–52.0)
Hemoglobin: 12.3 g/dL — ABNORMAL LOW (ref 13.0–17.0)
MCH: 29.2 pg (ref 26.0–34.0)
MCHC: 31.4 g/dL (ref 30.0–36.0)
MCV: 93.1 fL (ref 78.0–100.0)
PLATELETS: 318 10*3/uL (ref 150–400)
RBC: 4.21 MIL/uL — ABNORMAL LOW (ref 4.22–5.81)
RDW: 14.3 % (ref 11.5–15.5)
WBC: 19.6 10*3/uL — ABNORMAL HIGH (ref 4.0–10.5)

## 2015-12-17 LAB — GLUCOSE, CAPILLARY
GLUCOSE-CAPILLARY: 93 mg/dL (ref 65–99)
Glucose-Capillary: 99 mg/dL (ref 65–99)

## 2015-12-17 LAB — BASIC METABOLIC PANEL
Anion gap: 5 (ref 5–15)
BUN: 35 mg/dL — AB (ref 6–20)
CALCIUM: 9.1 mg/dL (ref 8.9–10.3)
CO2: 38 mmol/L — AB (ref 22–32)
Chloride: 94 mmol/L — ABNORMAL LOW (ref 101–111)
Creatinine, Ser: 1.06 mg/dL (ref 0.61–1.24)
GFR calc Af Amer: >60 mL/min (ref >60)
GLUCOSE: 106 mg/dL — AB (ref 65–99)
Potassium: 4.7 mmol/L (ref 3.5–5.1)
Sodium: 137 mmol/L (ref 135–145)

## 2015-12-17 LAB — CULTURE, RESPIRATORY

## 2015-12-17 LAB — CULTURE, RESPIRATORY W GRAM STAIN: Culture: NORMAL

## 2015-12-17 LAB — PROTIME-INR
INR: 2.54
PROTHROMBIN TIME: 27.8 s — AB (ref 11.4–15.2)

## 2015-12-17 MED ORDER — LEVOFLOXACIN 750 MG PO TABS
750.0000 mg | ORAL_TABLET | Freq: Every day | ORAL | Status: DC
  Administered 2015-12-17: 750 mg via ORAL
  Filled 2015-12-17: qty 1

## 2015-12-17 MED ORDER — WARFARIN SODIUM 5 MG PO TABS: 3.5000 mg | ORAL_TABLET | ORAL | Status: DC

## 2015-12-17 MED ORDER — PREDNISONE 5 MG PO TABS
5.0000 mg | ORAL_TABLET | Freq: Every day | ORAL | Status: AC
Start: 2015-12-17 — End: ?

## 2015-12-17 MED ORDER — PREDNISONE 20 MG PO TABS: 60.0000 mg | ORAL_TABLET | Freq: Two times a day (BID) | ORAL | Status: DC

## 2015-12-17 MED ORDER — LEVOFLOXACIN 750 MG PO TABS: 750.0000 mg | ORAL_TABLET | Freq: Every day | ORAL | 0 refills | Status: AC

## 2015-12-17 NOTE — Discharge Summary (Signed)
Physician Discharge Summary  DARDAN ZIETLOW [id number] DOB: [birth year] DOA: 12/13/2015  PCP: [facility]  Admit date: 12/13/2015 Discharge date: 12/17/2015  Admitted From: Home Disposition:  Home  Recommendations for Outpatient Follow-up:  1. Follow up with PCP in 1-2 weeks  Discharge Condition: Improved CODE STATUS: Full Diet recommendation: Heart healthy   Brief/Interim Summary: 27 yom with a hx of PAF, anemia, CKD, COPD, HTN, severe aortic stenosis status-post TAVR, and prostate cancer presented with complaints of dyspnea. Patient was discharged from the hospital on 10/6 after management for acute exacerbation of COPD. He reports his COPD worsened when he got back home so he returned to the hospital. He was noted to be hypoxic and tachycardic. He was admitted for further evaluation of COPD exacerbation with hypoxic respiratory failure.   1. Acute COPD exacerbation. Patient was given IV steroids, abx and neb treatments. Patient clinically improved and was weaned to [facility]. Home O2 will be provided. No wheezing on exam at time of discharge. Patient will complete 3 more days of PO levaquin and steroid taper on discharge 2. Acute Respiratory failure with hypoxia. Secondary to COPD exacerbation. He is on 2L of oxygen. Plan to d/c home with O2 3. Paroxysmal atrial fibrillation. He has a CHADSVASc score of 2. EKG shows sinus rhythm. He is anticoagulated on coumadin. INR is therapeutic. Heart rate is stable 4. Essential HTN. Pressures are stable. Continue coreg.   5.  GERD. Continue PPI.  6. Normocytic anemia. Hemoglobin remained stable. No significant signs of bleeding.  Discharge Diagnoses:  Principal Problem:   COPD exacerbation ([place]) Active Problems:   HTN (hypertension)   Paroxysmal atrial flutter (HCC)   Acute respiratory failure with hypoxia (HCC)   Leukocytosis   Iron deficiency anemia due to chronic blood loss   GERD (gastroesophageal reflux  disease)   Acute exacerbation of chronic obstructive pulmonary disease (COPD) ([place])    Discharge Instructions     Medication List    STOP taking these medications   doxycycline 100 MG capsule Commonly known as:  VIBRAMYCIN     TAKE these medications   albuterol 108 (90 Base) MCG/ACT inhaler Commonly known as:  PROVENTIL HFA;VENTOLIN HFA Inhale 1-2 puffs into the lungs every 6 (six) hours as needed for wheezing or shortness of breath.   albuterol (2.5 MG/3ML) 0.083% nebulizer solution Commonly known as:  PROVENTIL Take 3 mLs (2.5 mg total) by nebulization every 4 (four) hours as needed for wheezing or shortness of breath.   aspirin 81 MG chewable tablet Chew 1 tablet (81 mg total) by mouth daily.   benzonatate 100 MG capsule Commonly known as:  TESSALON Take 1 capsule (100 mg total) by mouth every 8 (eight) hours.   carvedilol 6.25 MG tablet Commonly known as:  COREG Take 1.5 tablets (9.375 mg total) by mouth 2 (two) times daily.   colchicine 0.6 MG tablet Take 0.6 mg by mouth daily.   ferrous sulfate 325 (65 FE) MG tablet Take 325 mg by mouth 2 (two) times daily with a meal.   Fluticasone-Salmeterol 250-50 MCG/DOSE Aepb Commonly known as:  ADVAIR DISKUS Inhale 1 puff into the lungs daily.   furosemide 40 MG tablet Commonly known as:  LASIX Take 40 mg by mouth daily as needed for edema.   gabapentin 600 MG tablet Commonly known as:  NEURONTIN Take 600 mg by mouth 3 (three) times daily.   guaiFENesin 600 MG 12 hr tablet Commonly known as:  MUCINEX Take 1 tablet (  600 mg total) by mouth 2 (two) times daily.   levofloxacin 750 MG tablet Commonly known as:  LEVAQUIN Take 1 tablet (750 mg total) by mouth daily.   pantoprazole 40 MG tablet Commonly known as:  PROTONIX Take 1 tablet (40 mg total) by mouth daily.   pravastatin 40 MG tablet Commonly known as:  PRAVACHOL Take 40 mg by mouth at bedtime.   predniSONE 5 MG tablet Commonly known as:   DELTASONE Take 1 tablet (5 mg total) by mouth daily with breakfast.   warfarin 3 MG tablet Commonly known as:  COUMADIN Take 3 mg by mouth daily. Pt takes with one-half of a 1mg  tablet.   warfarin 1 MG tablet Commonly known as:  COUMADIN Take 0.5 mg by mouth daily. Pt takes with a 3mg  tablet.   zolpidem 10 MG tablet Commonly known as:  AMBIEN Take 10 mg by mouth at bedtime.      [facility] On 12/15/2015.   Contact information: [address] [place] [address] [phone]          Allergies  Allergen Reactions  . Gabapentin Other (See Comments)    Reaction:  GI upset  Daughter states that pt is only allergic to 300mg  capsules.      Procedures/Studies: Dg Chest Port 1 View  Result Date: 12/13/2015 CLINICAL DATA:  Shortness of breath for 6 months, worse today. Discharge from hospital yesterday because of COPD exacerbation. EXAM: PORTABLE CHEST 1 VIEW COMPARISON:  12/11/2015 FINDINGS: Cardiac enlargement. No vascular congestion or edema. No blunting of costophrenic angles. No pneumothorax. No focal airspace disease or consolidation. Mediastinal contours appear intact. IMPRESSION: Cardiac enlargement.  No evidence of active pulmonary disease. Electronically Signed   By: Lucienne Capers M.D.   On: 12/13/2015 21:06   Dg Chest Portable 1 View  Result Date: 12/11/2015 CLINICAL DATA:  Shortness of breath.  History of prostate carcinoma EXAM: PORTABLE CHEST 1 VIEW COMPARISON:  Chest radiograph November 01, 2015 FINDINGS: There is no edema or consolidation. There is stable cardiomegaly with pulmonary vascular within normal limits. No adenopathy. There is a prosthetic aortic valve, stable. No adenopathy. No bone lesions. IMPRESSION: Stable cardiomegaly. Prosthetic aortic valve present. No edema or consolidation. Electronically Signed   By: Lowella Grip III M.D.   On: 12/11/2015 14:44    Subjective: Feels better. Eager to go  home  Discharge Exam: Vitals:   12/16/15 2145 12/17/15 0703  BP: (!) 99/54 139/81  Pulse: 94 97  Resp: 16 16  Temp: 97.6 F (36.4 C) 97.1 F (36.2 C)   Vitals:   12/17/15 0500 12/17/15 0703 12/17/15 0750 12/17/15 0806  BP:  139/81    Pulse:  97    Resp:  16    Temp:  97.1 F (36.2 C)    TempSrc:  Oral    SpO2:  100% 96% 96%  Weight: 110.7 kg (244 lb)     Height: 5\' 6"  (1.676 m)       General: Pt is alert, awake, not in acute distress Cardiovascular: RRR, S1/S2 +, no rubs, no gallops Respiratory: CTA bilaterally, no wheezing, no rhonchi Abdominal: Soft, NT, ND, bowel sounds + Extremities: no edema, no cyanosis   The results of significant diagnostics from this hospitalization (including imaging, microbiology, ancillary and laboratory) are listed below for reference.     Microbiology: Recent Results (from the past 240 hour(s))  Culture, respiratory (NON-Expectorated)     Status: None  Collection Time: 12/14/15  3:30 PM  Result Value Ref Range Status   Specimen Description SPUTUM  Final   Special Requests NONE  Final   Gram Stain   Final    FEW WBC PRESENT, PREDOMINANTLY MONONUCLEAR FEW SQUAMOUS EPITHELIAL CELLS PRESENT ABUNDANT GRAM POSITIVE COCCI IN PAIRS IN CLUSTERS FEW GRAM POSITIVE RODS RARE GRAM NEGATIVE RODS RARE BUDDING YEAST SEEN    Culture   Final    Consistent with normal respiratory flora. Performed at [facility]    Report Status 12/17/2015 FINAL  Final     Labs: BNP (last 3 results)  Recent Labs  09/29/15 1133 12/13/15 2016 12/15/15 1405  BNP 59.0 158.0* [id number]*   Basic Metabolic Panel:  Recent Labs Lab 12/12/15 0627 12/13/15 2016 12/14/15 0635 12/16/15 0610 12/17/15 0700  NA 141 140 139 135 137  K 4.8 4.4 5.0 4.6 4.7  CL 100* 101 99* 94* 94*  CO2 37* 34* 35* 35* 38*  GLUCOSE 115* 142* 142* 119* 106*  BUN 15 21* 21* 32* 35*  CREATININE 0.91 1.16 1.03 1.02 1.06  CALCIUM 9.1 9.2 9.1 8.7* 9.1   Liver Function  Tests: No results for input(s): AST, ALT, ALKPHOS, BILITOT, PROT, ALBUMIN in the last 168 hours. No results for input(s): LIPASE, AMYLASE in the last 168 hours. No results for input(s): AMMONIA in the last 168 hours. CBC:  Recent Labs Lab 12/12/15 0627 12/13/15 2016 12/15/15 0639 12/16/15 0610 12/17/15 0700  WBC 13.4* 14.1* 19.0* 19.6* 19.6*  NEUTROABS  --  10.9*  --   --   --   HGB 11.3* 11.4* 11.9* 11.6* 12.3*  HCT 36.0* 37.1* 37.6* 37.2* 39.2  MCV 94.2 94.4 93.3 92.5 93.1  PLT 346 345 312 339 318   Cardiac Enzymes: No results for input(s): CKTOTAL, CKMB, CKMBINDEX, TROPONINI in the last 168 hours. BNP: Invalid input(s): POCBNP CBG:  Recent Labs Lab 12/15/15 2201 12/16/15 0723 12/16/15 2108 12/17/15 0756 12/17/15 1141  GLUCAP 154* 115* 153* 99 93   D-Dimer No results for input(s): DDIMER in the last 72 hours. Hgb A1c No results for input(s): HGBA1C in the last 72 hours. Lipid Profile No results for input(s): CHOL, HDL, LDLCALC, TRIG, CHOLHDL, LDLDIRECT in the last 72 hours. Thyroid function studies No results for input(s): TSH, T4TOTAL, T3FREE, THYROIDAB in the last 72 hours.  Invalid input(s): FREET3 Anemia work up No results for input(s): VITAMINB12, FOLATE, FERRITIN, TIBC, IRON, RETICCTPCT in the last 72 hours. Urinalysis    Component Value Date/Time   COLORURINE YELLOW 07/04/2015 1122   APPEARANCEUR CLEAR 07/04/2015 1122   LABSPEC 1.023 07/04/2015 1122   PHURINE 5.5 07/04/2015 1122   GLUCOSEU NEGATIVE 07/04/2015 1122   HGBUR NEGATIVE 07/04/2015 1122   BILIRUBINUR NEGATIVE 07/04/2015 1122   KETONESUR NEGATIVE 07/04/2015 1122   PROTEINUR NEGATIVE 07/04/2015 1122   NITRITE NEGATIVE 07/04/2015 1122   LEUKOCYTESUR NEGATIVE 07/04/2015 1122   Sepsis Labs Invalid input(s): PROCALCITONIN,  WBC,  LACTICIDVEN Microbiology Recent Results (from the past 240 hour(s))  Culture, respiratory (NON-Expectorated)     Status: None   Collection Time: 12/14/15  3:30  PM  Result Value Ref Range Status   Specimen Description SPUTUM  Final   Special Requests NONE  Final   Gram Stain   Final    FEW WBC PRESENT, PREDOMINANTLY MONONUCLEAR FEW SQUAMOUS EPITHELIAL CELLS PRESENT ABUNDANT GRAM POSITIVE COCCI IN PAIRS IN CLUSTERS FEW GRAM POSITIVE RODS RARE GRAM NEGATIVE RODS RARE BUDDING YEAST SEEN    Culture  Final    Consistent with normal respiratory flora. Performed at [facility]    Report Status 12/17/2015 FINAL  Final     SIGNED:   Donne Hazel, MD  [facility] 12/17/2015, 12:35 PM  If 7PM-7AM, please contact night-coverage [email or web address] Password [email or web address]

## 2015-12-17 NOTE — Care Management Note (Signed)
Case Management Note  Patient Details  Name: Ryan Shea MRN: [id number] Date of Birth: 19-Mar-1945  Subjective/Objective:                  Pt admitted with COPD exacerbation. He is from home, he is ind with ADL's. He has PCP, transportation and no difficulty affording medications. He plans to return home with self care. He has qualified for supplemental oxygen at home and he will need a new neb machine as his is very old and he feels this may be contributing to his exacerbation. Pt has chosen [facility] from list of DME providers. Romualdo Bolk, of [facility], is aware of referral and will obtain pt info from chart. She will deliver DME to pt room prior to DC.   Action/Plan: Pt anticipates DC in next 24hrs.   Expected Discharge Date:    12/17/2015              Expected Discharge Plan:  Home/Self Care  In-House Referral:  NA  Discharge planning Services  CM Consult  Post Acute Care Choice:  Durable Medical Equipment Choice offered to:  Patient  DME Arranged:  Oxygen, Nebulizer machine DME Agency:  [place]:    [facility] Agency:     Status of Service:  Completed, signed off  Sherald Barge, RN 12/17/2015, 12:03 PM

## 2015-12-17 NOTE — Progress Notes (Signed)
ANTICOAGULATION CONSULT NOTE - follow up  Pharmacy Consult for Coumadin Indication: atrial fibrillation  Allergies  Allergen Reactions  . Gabapentin Other (See Comments)    Reaction:  GI upset  Daughter states that pt is only allergic to 300mg  capsules.     Patient Measurements: Height: 5\' 6"  (167.6 cm) Weight: 244 lb (110.7 kg) IBW/kg (Calculated) : 63.8  Vital Signs: Temp: 97.1 F (36.2 C) (10/11 0703) Temp Source: Oral (10/11 0703) BP: 139/81 (10/11 0703) Pulse Rate: 97 (10/11 0703)  Labs:  Recent Labs  12/15/15 0639 12/16/15 0610 12/17/15 0700  HGB 11.9* 11.6* 12.3*  HCT 37.6* 37.2* 39.2  PLT 312 339 318  LABPROT 27.0* 27.2* 27.8*  INR 2.45 2.47 2.54  CREATININE  --  1.02 1.06    Estimated Creatinine Clearance: 74.7 mL/min (by C-G formula based on SCr of 1.06 mg/dL).  Medical History: Past Medical History:  Diagnosis Date  . 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 Angelena Form  . Asthma   . Atrial flutter ([place])   . Chronic anemia   . CKD (chronic kidney disease) stage 3, GFR 30-59 ml/min   . COPD (chronic obstructive pulmonary disease) ([place])   . Essential hypertension   . Gout   . Helicobacter pylori gastritis 05/2014  . Lung nodule   . Mitral regurgitation   . Multiple duodenal ulcers 05/2014  . Prostate cancer ([place])   . Thyroid nodule     Medications:  Prescriptions Prior to Admission  Medication Sig Dispense Refill Last Dose  . albuterol (PROVENTIL HFA;VENTOLIN HFA) 108 (90 Base) MCG/ACT inhaler Inhale 1-2 puffs into the lungs every 6 (six) hours as needed for wheezing or shortness of breath.   12/13/2015 at Unknown time  . albuterol (PROVENTIL) (2.5 MG/3ML) 0.083% nebulizer solution Take 3 mLs (2.5 mg total) by nebulization every 4 (four) hours as needed for wheezing or shortness of breath. 75 mL 12 12/13/2015 at Unknown time  . aspirin 81 MG chewable tablet Chew 1 tablet (81 mg total) by mouth daily. 30 tablet 0 12/12/2015 at 0800  .  carvedilol (COREG) 6.25 MG tablet Take 1.5 tablets (9.375 mg total) by mouth 2 (two) times daily. 180 tablet 3 12/13/2015 at 0800  . colchicine 0.6 MG tablet Take 0.6 mg by mouth daily.   12/13/2015 at Unknown time  . doxycycline (VIBRAMYCIN) 100 MG capsule Take 100 mg by mouth 2 (two) times daily.    12/13/2015 at Unknown time  . ferrous sulfate 325 (65 FE) MG tablet Take 325 mg by mouth 2 (two) times daily with a meal.    12/13/2015 at Unknown time  . Fluticasone-Salmeterol (ADVAIR DISKUS) 250-50 MCG/DOSE AEPB Inhale 1 puff into the lungs daily. 60 each 1 12/13/2015 at Unknown time  . furosemide (LASIX) 40 MG tablet Take 40 mg by mouth daily as needed for edema.   12/12/2015 at Unknown time  . gabapentin (NEURONTIN) 600 MG tablet Take 600 mg by mouth 3 (three) times daily.   12/13/2015 at Unknown time  . guaiFENesin (MUCINEX) 600 MG 12 hr tablet Take 1 tablet (600 mg total) by mouth 2 (two) times daily. 30 tablet 0 12/13/2015 at 0800  . pantoprazole (PROTONIX) 40 MG tablet Take 1 tablet (40 mg total) by mouth daily. 60 tablet 5 12/13/2015 at Unknown time  . pravastatin (PRAVACHOL) 40 MG tablet Take 40 mg by mouth at bedtime.    12/12/2015 at Unknown time  . warfarin (COUMADIN) 1 MG tablet Take  0.5 mg by mouth daily. Pt takes with a 3mg  tablet.   12/13/2015 at 0800  . warfarin (COUMADIN) 3 MG tablet Take 3 mg by mouth daily. Pt takes with one-half of a 1mg  tablet.   12/13/2015 at 0800  . zolpidem (AMBIEN) 10 MG tablet Take 10 mg by mouth at bedtime.   12/12/2015 at Unknown time   Assessment: Continuation of coumadin PTA. Patient taking 3.5 mg po daily as listed on PTA list INR therapeutic and stable.  No bleeding reported.    Goal of Therapy:  INR 2-3 Monitor platelets by anticoagulation protocol: Yes   Plan:  Coumadin 3.5 mg po DAILY AT 4PM (home regimen) INR/PT EVERY OTHER DAY CBC EVERY OTHER DAY Monitor for signs of bleeding  [place] Crane, Hilda Rynders A 12/17/2015,10:10 AM

## 2015-12-22 NOTE — Progress Notes (Deleted)
Cardiology Office Note  Date: 12/22/2015   ID: Calder, Janiga 01/07/1946, MRN [id number]  PCP: [facility]  Primary Cardiologist: Kate Sable, MD  No chief complaint on file.   History of Present Illness: Ryan Shea is a 70 y.o. male patient of Dr. Bronson Ing, last seen by Ms. Lawrence NP in May. He was inadvertently placed on my schedule today for a follow-up visit.  I reviewed records and updated the chart. Record review finds recent hospitalization with COPD exacerbation complicated by hypoxic respiratory failure.  Recent echocardiogram from July is outlined below.  Past Medical History:  Diagnosis Date  . 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 Angelena Form  . Asthma   . Atrial flutter ([place])   . Chronic anemia   . CKD (chronic kidney disease) stage 3, GFR 30-59 ml/min   . COPD (chronic obstructive pulmonary disease) ([place])   . Essential hypertension   . Gout   . Helicobacter pylori gastritis 05/2014  . Lung nodule   . Mitral regurgitation   . Multiple duodenal ulcers 05/2014  . Prostate cancer ([place])   . Thyroid nodule     Past Surgical History:  Procedure Laterality Date  . CARDIAC CATHETERIZATION N/A 03/26/2015   Procedure: Right/Left Heart Cath and Coronary Angiography;  Surgeon: Burnell Blanks, MD;  Location: [facility];  Service: Cardiovascular;  Laterality: N/A;  . COLONOSCOPY    . COLONOSCOPY N/A 08/25/2014   Rehman:examination performed to cecum. moderate number of diverticula at sigmoid colon without stigmata of bleed small external hemorrhoids and anal papillae  . ESOPHAGOGASTRODUODENOSCOPY N/A 05/31/2014   H PYLORI GASTRITIS, DUODENAL ULCERS  . GIVENS CAPSULE STUDY N/A 04/18/2015   Procedure: GIVENS CAPSULE STUDY;  Surgeon: Danie Binder, MD;  [facility];  Service: Endoscopy;  Laterality: N/A;  . HERNIA REPAIR    . PROSTATE SURGERY    . TEE WITHOUT CARDIOVERSION N/A 07/08/2015     Procedure: TRANSESOPHAGEAL ECHOCARDIOGRAM (TEE);  Surgeon: Burnell Blanks, MD;  Location: [place];  Service: Open Heart Surgery;  Laterality: N/A;  . TRANSCATHETER AORTIC VALVE REPLACEMENT, TRANSFEMORAL N/A 07/08/2015   Procedure: TRANSCATHETER AORTIC VALVE REPLACEMENT, TRANSFEMORAL;  Surgeon: Burnell Blanks, MD;  Location: [place];  Service: Open Heart Surgery;  Laterality: N/A;    Current Outpatient Prescriptions  Medication Sig Dispense Refill  . albuterol (PROVENTIL HFA;VENTOLIN HFA) 108 (90 Base) MCG/ACT inhaler Inhale 1-2 puffs into the lungs every 6 (six) hours as needed for wheezing or shortness of breath.    Marland Kitchen albuterol (PROVENTIL) (2.5 MG/3ML) 0.083% nebulizer solution Take 3 mLs (2.5 mg total) by nebulization every 4 (four) hours as needed for wheezing or shortness of breath. 75 mL 12  . aspirin 81 MG chewable tablet Chew 1 tablet (81 mg total) by mouth daily. 30 tablet 0  . benzonatate (TESSALON) 100 MG capsule Take 1 capsule (100 mg total) by mouth every 8 (eight) hours. 21 capsule 0  . carvedilol (COREG) 6.25 MG tablet Take 1.5 tablets (9.375 mg total) by mouth 2 (two) times daily. 180 tablet 3  . colchicine 0.6 MG tablet Take 0.6 mg by mouth daily.    . ferrous sulfate 325 (65 FE) MG tablet Take 325 mg by mouth 2 (two) times daily with a meal.     . Fluticasone-Salmeterol (ADVAIR DISKUS) 250-50 MCG/DOSE AEPB Inhale 1 puff into the lungs daily. 60 each 1  . furosemide (LASIX)  40 MG tablet Take 40 mg by mouth daily as needed for edema.    . gabapentin (NEURONTIN) 600 MG tablet Take 600 mg by mouth 3 (three) times daily.    Marland Kitchen guaiFENesin (MUCINEX) 600 MG 12 hr tablet Take 1 tablet (600 mg total) by mouth 2 (two) times daily. 30 tablet 0  . levofloxacin (LEVAQUIN) 750 MG tablet Take 1 tablet (750 mg total) by mouth daily. 3 tablet 0  . pantoprazole (PROTONIX) 40 MG tablet Take 1 tablet (40 mg total) by mouth daily. 60 tablet 5  . pravastatin (PRAVACHOL) 40 MG tablet Take  40 mg by mouth at bedtime.     . predniSONE (DELTASONE) 5 MG tablet Take 1 tablet (5 mg total) by mouth daily with breakfast.    . warfarin (COUMADIN) 1 MG tablet Take 0.5 mg by mouth daily. Pt takes with a 3mg  tablet.    . warfarin (COUMADIN) 3 MG tablet Take 3 mg by mouth daily. Pt takes with one-half of a 1mg  tablet.    Marland Kitchen zolpidem (AMBIEN) 10 MG tablet Take 10 mg by mouth at bedtime.     No current facility-administered medications for this visit.    Facility-Administered Medications Ordered in Other Visits  Medication Dose Route Frequency Provider Last Rate Last Dose  . 0.9 %  sodium chloride infusion   Intravenous Continuous Baird Cancer, PA-C   Stopped at 08/28/15 1405   Allergies:  Gabapentin   Social History: The patient  reports that he quit smoking about 37 years ago. His smoking use included Cigarettes. He started smoking about 56 years ago. He has a 40.00 pack-year smoking history. He has quit using smokeless tobacco. He reports that he does not drink alcohol or use drugs.   Family History: The patient's family history includes Cancer in his other; Prostate cancer in his brother and brother; Stroke in his mother.   ROS:  Please see the history of present illness. Otherwise, complete review of systems is positive for {NONE DEFAULTED:18576::"none"}.  All other systems are reviewed and negative.   Physical Exam: VS:  There were no vitals taken for this visit., BMI There is no height or weight on file to calculate BMI.  Wt Readings from Last 3 Encounters:  12/17/15 244 lb (110.7 kg)  12/11/15 235 lb (106.6 kg)  11/01/15 246 lb (111.6 kg)    General: Patient appears comfortable at rest. HEENT: Conjunctiva and lids normal, oropharynx clear with moist mucosa. Neck: Supple, no elevated JVP or carotid bruits, no thyromegaly. Lungs: Clear to auscultation, nonlabored breathing at rest. Cardiac: Regular rate and rhythm, no S3 or significant systolic murmur, no pericardial  rub. Abdomen: Soft, nontender, no hepatomegaly, bowel sounds present, no guarding or rebound. Extremities: No pitting edema, distal pulses 2+. Skin: Warm and dry. Musculoskeletal: No kyphosis. Neuropsychiatric: Alert and oriented x3, affect grossly appropriate.  ECG: I personally reviewed the tracing from 12/13/2015 which showed sinus tachycardia with left bundle branch block.  Recent Labwork: 04/30/2015: TSH 0.156 07/09/2015: Magnesium 1.8 11/03/2015: ALT 11; AST 15 12/15/2015: B Natriuretic Peptide 105.0 12/17/2015: BUN 35; Creatinine, Ser 1.06; Hemoglobin 12.3; Platelets 318; Potassium 4.7; Sodium 137     Component Value Date/Time   CHOL 166 03/26/2015 0416   TRIG 71 03/26/2015 0416   HDL 46 03/26/2015 0416   CHOLHDL 3.6 03/26/2015 0416   VLDL 14 03/26/2015 0416   LDLCALC 106 (H) 03/26/2015 0416    Other Studies Reviewed Today:  Echocardiogram 09/30/2015: Study Conclusions  - Left  ventricle: The cavity size was normal. There was moderate   concentric hypertrophy. Systolic function was normal. The   estimated ejection fraction was in the range of 60% to 65%. - Aortic valve: Bioprosthetic aortic valve s/p TAVR. No significant   regurgitation noted. No significant stenosis. Valve area (VTI):   1.91 cm^2. Valve area (Vmax): 1.9 cm^2. Valve area (Vmean): 1.84   cm^2. - Mitral valve: Calcified annulus. Mildly calcified leaflets . - Left atrium: The atrium was mildly to moderately dilated. - Atrial septum: No defect or patent foramen ovale was identified.  Assessment and Plan:   Current medicines were reviewed with the patient today.  No orders of the defined types were placed in this encounter.   Disposition:  Signed, Satira Sark, MD, [facility] 12/22/2015 3:42 PM    [facility] [address] [place], [place] [address] Phone: [phone]; Fax: [phone]

## 2015-12-23 ENCOUNTER — Ambulatory Visit: Payer: Medicare PPO | Admitting: Cardiology

## 2015-12-23 ENCOUNTER — Encounter: Payer: Self-pay | Admitting: Cardiology

## 2016-01-21 ENCOUNTER — Encounter: Payer: Self-pay | Admitting: Adult Health

## 2016-02-06 ENCOUNTER — Ambulatory Visit: Payer: Medicare PPO | Admitting: Adult Health

## 2016-02-06 DEATH — deceased

## 2016-02-20 ENCOUNTER — Other Ambulatory Visit: Payer: Self-pay | Admitting: Nurse Practitioner
